# Patient Record
Sex: Male | Born: 1955
Health system: Southern US, Community
[De-identification: ages and names within clinical notes are randomized; demographics above are authoritative.]

## PROBLEM LIST (undated history)

## (undated) DIAGNOSIS — I499 Cardiac arrhythmia, unspecified: Secondary | ICD-10-CM

## (undated) DIAGNOSIS — J45909 Unspecified asthma, uncomplicated: Secondary | ICD-10-CM

## (undated) DIAGNOSIS — R911 Solitary pulmonary nodule: Secondary | ICD-10-CM

## (undated) DIAGNOSIS — C801 Malignant (primary) neoplasm, unspecified: Secondary | ICD-10-CM

## (undated) DIAGNOSIS — Z5189 Encounter for other specified aftercare: Secondary | ICD-10-CM

## (undated) DIAGNOSIS — C3490 Malignant neoplasm of unspecified part of unspecified bronchus or lung: Secondary | ICD-10-CM

## (undated) DIAGNOSIS — I429 Cardiomyopathy, unspecified: Secondary | ICD-10-CM

## (undated) DIAGNOSIS — Q659 Congenital deformity of hip, unspecified: Secondary | ICD-10-CM

## (undated) DIAGNOSIS — M1990 Unspecified osteoarthritis, unspecified site: Secondary | ICD-10-CM

## (undated) DIAGNOSIS — B019 Varicella without complication: Secondary | ICD-10-CM

## (undated) DIAGNOSIS — T7840XA Allergy, unspecified, initial encounter: Secondary | ICD-10-CM

## (undated) HISTORY — DX: Varicella without complication: B01.9

## (undated) HISTORY — DX: Encounter for other specified aftercare: Z51.89

## (undated) HISTORY — PX: POLYPECTOMY: SHX149

## (undated) HISTORY — PX: HERNIA REPAIR: SHX51

## (undated) HISTORY — DX: Malignant neoplasm of unspecified part of unspecified bronchus or lung: C34.90

## (undated) HISTORY — DX: Malignant (primary) neoplasm, unspecified: C80.1

## (undated) HISTORY — DX: Congenital deformity of hip, unspecified: Q65.9

## (undated) HISTORY — DX: Allergy, unspecified, initial encounter: T78.40XA

## (undated) HISTORY — PX: VASECTOMY: SHX75

## (undated) HISTORY — PX: HIP SURGERY: SHX245

## (undated) HISTORY — PX: JOINT REPLACEMENT: SHX530

## (undated) HISTORY — PX: SPINE SURGERY: SHX786

---

## 1983-08-09 HISTORY — PX: TOTAL HIP ARTHROPLASTY: SHX124

## 1999-09-08 ENCOUNTER — Encounter: Payer: Self-pay | Admitting: Emergency Medicine

## 1999-09-08 ENCOUNTER — Emergency Department (HOSPITAL_COMMUNITY): Admission: EM | Admit: 1999-09-08 | Discharge: 1999-09-08 | Payer: Self-pay | Admitting: *Deleted

## 2002-12-16 ENCOUNTER — Encounter: Admission: RE | Admit: 2002-12-16 | Discharge: 2002-12-16 | Payer: Self-pay | Admitting: Neurosurgery

## 2002-12-16 ENCOUNTER — Encounter: Payer: Self-pay | Admitting: Diagnostic Radiology

## 2002-12-16 ENCOUNTER — Encounter: Payer: Self-pay | Admitting: Neurosurgery

## 2002-12-30 ENCOUNTER — Encounter: Admission: RE | Admit: 2002-12-30 | Discharge: 2002-12-30 | Payer: Self-pay | Admitting: Neurosurgery

## 2002-12-30 ENCOUNTER — Encounter: Payer: Self-pay | Admitting: Neurosurgery

## 2003-01-14 ENCOUNTER — Encounter: Admission: RE | Admit: 2003-01-14 | Discharge: 2003-01-14 | Payer: Self-pay | Admitting: Neurosurgery

## 2003-01-14 ENCOUNTER — Encounter: Payer: Self-pay | Admitting: Neurosurgery

## 2003-08-09 LAB — HM COLONOSCOPY

## 2004-07-26 ENCOUNTER — Ambulatory Visit: Payer: Self-pay | Admitting: Gastroenterology

## 2004-08-23 ENCOUNTER — Ambulatory Visit: Payer: Self-pay | Admitting: Gastroenterology

## 2006-08-08 HISTORY — PX: THUMB ARTHROSCOPY: SHX2509

## 2008-04-08 ENCOUNTER — Encounter: Payer: Self-pay | Admitting: Emergency Medicine

## 2008-04-08 ENCOUNTER — Inpatient Hospital Stay (HOSPITAL_COMMUNITY): Admission: EM | Admit: 2008-04-08 | Discharge: 2008-04-09 | Payer: Self-pay | Admitting: General Surgery

## 2008-04-17 ENCOUNTER — Ambulatory Visit (HOSPITAL_COMMUNITY): Admission: RE | Admit: 2008-04-17 | Discharge: 2008-04-17 | Payer: Self-pay | Admitting: General Surgery

## 2008-05-01 ENCOUNTER — Ambulatory Visit (HOSPITAL_COMMUNITY): Admission: RE | Admit: 2008-05-01 | Discharge: 2008-05-01 | Payer: Self-pay | Admitting: Physician Assistant

## 2008-12-15 ENCOUNTER — Encounter: Admission: RE | Admit: 2008-12-15 | Discharge: 2008-12-15 | Payer: Self-pay | Admitting: Internal Medicine

## 2009-03-18 ENCOUNTER — Inpatient Hospital Stay (HOSPITAL_COMMUNITY): Admission: RE | Admit: 2009-03-18 | Discharge: 2009-03-19 | Payer: Self-pay | Admitting: Orthopedic Surgery

## 2009-04-15 ENCOUNTER — Encounter: Admission: RE | Admit: 2009-04-15 | Discharge: 2009-07-07 | Payer: Self-pay | Admitting: Orthopedic Surgery

## 2010-11-13 LAB — URINALYSIS, ROUTINE W REFLEX MICROSCOPIC
Glucose, UA: NEGATIVE mg/dL
Ketones, ur: NEGATIVE mg/dL
Nitrite: NEGATIVE
Protein, ur: NEGATIVE mg/dL
pH: 5 (ref 5.0–8.0)

## 2010-11-13 LAB — COMPREHENSIVE METABOLIC PANEL
ALT: 25 U/L (ref 0–53)
AST: 28 U/L (ref 0–37)
Albumin: 4.4 g/dL (ref 3.5–5.2)
Calcium: 9.7 mg/dL (ref 8.4–10.5)
Creatinine, Ser: 0.9 mg/dL (ref 0.4–1.5)
GFR calc Af Amer: >60 mL/min (ref >60)
Sodium: 139 mEq/L (ref 135–145)
Total Protein: 7.4 g/dL (ref 6.0–8.3)

## 2010-11-13 LAB — CBC
HCT: 42.2 % (ref 39.0–52.0)
Hemoglobin: 14.4 g/dL (ref 13.0–17.0)
MCHC: 34.1 g/dL (ref 30.0–36.0)
MCHC: 34.1 g/dL (ref 30.0–36.0)
MCV: 96.8 fL (ref 78.0–100.0)
MCV: 96.9 fL (ref 78.0–100.0)
Platelets: 160 10*3/uL (ref 150–400)
Platelets: 204 10*3/uL (ref 150–400)
RBC: 5.09 MIL/uL (ref 4.22–5.81)
RDW: 12.4 % (ref 11.5–15.5)
RDW: 12.9 % (ref 11.5–15.5)

## 2010-11-13 LAB — BASIC METABOLIC PANEL
BUN: 7 mg/dL (ref 6–23)
CO2: 30 mEq/L (ref 19–32)
Calcium: 8.7 mg/dL (ref 8.4–10.5)
Chloride: 100 mEq/L (ref 96–112)
Creatinine, Ser: 1.06 mg/dL (ref 0.4–1.5)
GFR calc Af Amer: >60 mL/min (ref >60)

## 2010-11-13 LAB — ANAEROBIC CULTURE: Gram Stain: NONE SEEN

## 2010-11-13 LAB — WOUND CULTURE: Culture: NO GROWTH

## 2010-11-13 LAB — TYPE AND SCREEN: ABO/RH(D): B POS

## 2010-11-13 LAB — ABO/RH: ABO/RH(D): B POS

## 2010-11-13 LAB — APTT: aPTT: 28 seconds (ref 24–37)

## 2010-12-21 NOTE — Op Note (Signed)
Brett [name], [name] NO.:  [id number]   MEDICAL RECORD NO.:  [id number]          PATIENT TYPE:  INP   LOCATION:  5005                         FACILITY:  [facility]   PHYSICIAN:  Loreta Ave, M.D. DATE OF BIRTH:  January 29, 1956   DATE OF PROCEDURE:  03/18/2009  DATE OF DISCHARGE:                               OPERATIVE REPORT   PREOPERATIVE DIAGNOSES:  1. 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  2. 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  Now with      aseptic polyethylene wear and loosening, symptomatic.   POSTOPERATIVE DIAGNOSES:  1. 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  2. 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  Now with      aseptic polyethylene wear and loosening, symptomatic.  3. Significant polyethylene wear of the acetabulum.  4. Well-fixed metallic component.  5. Leg length inequality with residual shortening.  6. Significant synovitis and scar tissue from previous operative      interventions and polyethylene wear.  No evidence of infection.   PROCEDURES:  1. Exploration and revision of right total hip replacement.  2. Extensive synovectomy.  3. Lysis debridement of adhesions.  4. Conversion of acetabular component to a new size D polyethylene      shell with a 10-degree overhang and 32 mm internal diameter.  5. Revision of femoral component to a +5 x 32 mm head and neck.   SURGEON:  Loreta Ave, MD   ASSISTANT:  1. Eulas Post, MD  2. Genene Churn. Barry Dienes, [place]   Both present throughout the entire case, necessary for timely completion  of procedure.   ANESTHESIA:  General.   BLOOD LOSS:  200 mL.   BLOOD GIVEN:  None.   SPECIMENS:  None.   CULTURES:  Clear joint fluid was sent for aerobic and anaerobic culture,  although concern of infection was extremely low.   SPECIMENS:  None.   DRESSING:  Soft  and compressive with abduction pillow.   PROCEDURE:  The patient was brought to the operating room, placed on the  operating table in supine position.  After adequate anesthesia had been  obtained, turned to a lateral position.  Prepped and draped in usual  sterile fashion.  I utilized his previous longitudinal incision up to  the trochanter, but then extending posterosuperior.  Skin and  subcutaneous tissue were divided.  Iliotibial band exposed and incised.  Charnley retractor put in place.  Neurovascular structures were  identified and protected.  External rotator and capsule were taken down  off the back of the intertrochanteric groove and tied with FiberWire.  A  lot of synovitis particle debris adhesions throughout the entire hip  joint.  These were gradually excised and removed in its entirety.  A  scant amount of clear fluid was sent for aerobic and anaerobic cultures.  The hip was then dislocated posteriorly.  The femoral head removed.  The  stem was very well fixed with a  reasonable anteversion preserved and  reasonable length.  Exposure of the acetabulum.  Fortunately, this  turned out to be a 2-piece acetabular component.  I could fairly easily  removed the polyethylene shell.  The metallic shell was thoroughly  assessed and very solidly fixed throughout.  A lot of time was spent  removing adhesions inflammatory debris throughout.  I then went through  a sequence of trials to try to reestablish good position of his hip and  regain as much leg length as we could.  I finally chose a 32-mm internal  diameter polyethylene lining with a 10-degree overhang placed  posterosuperior.  We added length by revising polyethylene, but also  added length by using a 32-mm +5 mm head neck component.  After trials  were complete, the definitive polyethylene acetabular component was  seated in the metallic shell.  The head attached to the femur.  The hip  reduced.  I had regained about half of  his leg length inequality.  Good  stability in flexion/extension.  Wound was thoroughly irrigated.  External rotator and capsule repair to the back of the intertrochanteric  groove through drill holes and FiberWire tied over bony bridge.  Wound  irrigated.  Charnley retractor removed.  Iliotibial band closed with #1  Vicryl.  Skin and subcutaneous tissue with Vicryl and staples.  Margins  were injected with Marcaine.  Sterile compressive dressing applied.  Returned to supine position.  Abduction pillow placed.  Anesthesia  reversed.  Brought to recovery room.  Tolerated surgery well.  No  complications.      Loreta Ave, M.D.  Electronically Signed     DFM/MEDQ  D:  03/18/2009  T:  03/19/2009  Job:  [id number]

## 2010-12-21 NOTE — H&P (Signed)
[name], [name] NO.:  [id number]   MEDICAL RECORD NO.:  [id number]          PATIENT TYPE:  INP   LOCATION:  2107                         FACILITY:  [facility]   PHYSICIAN:  Gabrielle Dare. Janee Morn, M.D.DATE OF BIRTH:  06/22/1956   DATE OF ADMISSION:  04/08/2008  DATE OF DISCHARGE:                              HISTORY & PHYSICAL   CHIEF COMPLAINT:  Left-sided pain after fall.   HISTORY OF PRESENT ILLNESS:  Brett [name] is a very pleasant 55 year old  white male who was walking at work.  Near the end of his shift when he  fell towards his left side, he struck his left side on the ground and  his forehead.  He was evaluated at the [facility]  in [facility] and found to have a 10% left-sided apical pneumothorax.  I  accepted him in transfer to the trauma service for step-down bed, none  were available, so he presented to the medical intensive care unit.  On  arrival, he had no shortness of breath.  He does complain of some  significant left rib pain.  He had no loss of consciousness during this  event.  He claims he has had chronic trouble with his right hip due to  congenital hip dysplasia, and it gives out on him frequently.  He feels  this is the reason for his fall.   PAST MEDICAL HISTORY:  1. Glaucoma.  2. Right congenital hip dysplasia.   PAST SURGICAL HISTORY:  Five hip surgeries on the right side due to  congenital hip dysplasia and this includes a right hip replacement in  [place] about 23 years ago as well as a perihardware fracture.  He is  followed by Dr. Mckinley Jewel here in town and according to the patient,  is needing a revision within the next 6 months.   SOCIAL HISTORY:  He denies drug use.  He smokes cigarettes.  He  occasionally drinks alcohol.  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   ALLERGIES:  No known drug allergies.   MEDICATIONS:  Ibuprofen and Tylenol p.r.n.  He recently took  some  Vicodin after a teeth extraction but is not currently taking that now.  Tetanus is up-to-date.   REVIEW OF SYSTEMS:  MUSCULOSKELETAL:  Includes the above, chest wall  complaints and chronic trouble with his right hip.  CARDIAC:  Negative.  PULMONARY:  No shortness of breath, but pain along the left chest wall.  GI:  Negative.  GU:  Negative.  NEUROPSYCH:  Negative.  CONSTITUTIONAL:  Negative.  Remainder of the review of systems was unremarkable.   PHYSICAL EXAMINATION:  VITAL SIGNS:  Pulse 65, respirations 19, blood  pressure 113/63, and saturations 98% on 2 liters nasal cannula.  HEENT:  He has a small 2-cm laceration over his left eyebrow which was  closed down at the [facility] in [facility].  There  is no bleeding.  Eyes, pupils are equal and reactive.  He does wear  glasses.  Ears are clear.  Face is symmetric  and atraumatic.  NECK:  He has no midline tenderness or step-off.  He does have mild  muscular tenderness on the left side.  PULMONARY:  He has some tenderness and a small contusion over his left  chest wall.  LUNGS:  However, clear to auscultation and equal on both sides.  CARDIOVASCULAR:  Heart is regular.  No murmurs are heard.  Impulses  palpable laterally in the left chest.  Distal pulses are 2+ with no  peripheral edema.  ABDOMEN:  Soft and nontender.  Bowel sounds are present.  No  organomegaly is noted.  No masses are felt.  PELVIS:  Stable anteriorly.  MUSCULOSKELETAL:  He has scars from previous right hip surgeries noted.  He also has some limitation of range of motion in the right hip and some  slight decreased strength there.  However, he notes this is chronic.  BACK:  No step-offs or tenderness.  NEUROLOGIC:  He is awake and alert.  Glasgow coma scale is 15.  He is  moving all extremities well except for limited in his right hip on a  chronic basis as above.   LABORATORY DATA:  Pending.  Chest x-ray shows 10% left apical   pneumothorax with no visualized rib fractures.  CT scan of the head  negative.  CT scan of cervical spine negative for acute change.   IMPRESSION:  A 55 year old white male who is status post fall with:  1. Left apical pneumothorax 10%.  2. Laceration over left eyebrow.   PLAN:  To admit to the trauma service and step-down unit status and  check a followup chest x-ray in the morning.  I advised him if his  pneumothorax enlarges, he may well need a tube thoracostomy.  Questions  were answered and he expressed his understanding.      Gabrielle Dare Janee Morn, M.D.  Electronically Signed     BET/MEDQ  D:  04/09/2008  T:  04/09/2008  Job:  [id number]   cc:   Loreta Ave, M.D.

## 2010-12-21 NOTE — Discharge Summary (Signed)
NAMEIRVAN, TIEDT NO.:  [id number]   MEDICAL RECORD NO.:  [id number]          PATIENT TYPE:  INP   LOCATION:  2107                         FACILITY:  [facility]   PHYSICIAN:  Cherylynn Ridges, M.D.    DATE OF BIRTH:  16-Nov-1955   DATE OF ADMISSION:  04/08/2008  DATE OF DISCHARGE:  04/09/2008                               DISCHARGE SUMMARY   DISCHARGE DIAGNOSIS:  Left apical pneumothorax status post fall.   OTHER DIAGNOSIS:  Status post total right hip with need for likely  replacement.   DISCHARGE MEDICATIONS:  Vicodin 1-2 tablets every 4 hours as needed for  pain.  He will get 30 tablets with 1 refill.   He is to follow up to see [name] in the trauma clinic after getting a chest  x-ray on April 17, 2008.  Prescription for Vicodin and a chest x-ray  have been given to the patient.  He is to ambulate as tolerated.  He can  start driving when he does not have to take his pain medicine.  He can  get back to work as of Tuesday, which is April 15, 2008.  He has been  advised that if he should get acutely short of breath, to come back to  the ER as soon as possible.  The patient is a smoker.  He has also been  advised not to smoke as this may worsen his problem, increase  pneumothorax or worsen the atelectasis.  In spite of that, the patient  admits that he will likely smoke.  He will come back to see [name] in about  1 week.   BRIEF SUMMARY OF HOSPITAL COURSE:  The patient is a 55 year old who fell  down at work, breaking a rib in his left chest wall and likely causing a  left apical pneumothorax.  Although, no rib fractures were noted on  chest x-ray either yesterday or today, this is likely the etiology of  his pneumothorax, which slightly expanded on chest x-ray today, but not  to worry, clinically insignificant.  His oxygen saturation is between  93% to 96% on room air.  He does not appear to be clinically short of  breath.  His breath sounds are equal and  symmetrical bilaterally.  He  has no subcutaneous air and he is to be discharged to home in the care  of his family with pain medicines orally.  He will follow up to see [name]  in 1 week.      Cherylynn Ridges, M.D.  Electronically Signed     JOW/MEDQ  D:  04/09/2008  T:  04/09/2008  Job:  [id number]

## 2011-05-11 LAB — CBC
Hemoglobin: 13.6
MCHC: 34
Platelets: 230
RDW: 12.1

## 2011-05-11 LAB — BASIC METABOLIC PANEL
BUN: 10
CO2: 27
Calcium: 8.6
Creatinine, Ser: 0.79
GFR calc non Af Amer: >60
Glucose, Bld: 133 — ABNORMAL HIGH

## 2011-05-11 LAB — GLUCOSE, CAPILLARY: Glucose-Capillary: 127 — ABNORMAL HIGH

## 2011-07-25 NOTE — H&P (Signed)
Brett [name] is seen for follow-up. Status post revision total hip replacement on the right by me 03/18/09 doing well. No complaints with regards to his hip and he's really doing well with the revision. Comes in for routine follow-up. The other thing he's brought up has been an issue of painful deformity lesser toes right foot greater than left. Especially the 2nd and 4th toe on the right foot where he has painful fixed clawing of the 2nd toe and angular deformity PIP joint 4th toe where it rides under the 3rd toe. He's had high arches long-standing some spasticity both feet so he's had dynamic clawing of both feet. This is present on the left but everything was tolerable until he started getting fixed deformities on the right foot. He would like surgical correction. Remaining history is updated included in the chart. General exam is outlined included in the chart.   EXAMINATION: His gait and stance looks fairly good. Negative log roll right hip. Distally he has good pulses and sensation. He has a high arch on both feet and definitely has dynamic clawing of the lesser toes but this is correctable and when he goes into a plantigrade position alignment is acceptable and there's no skin breakdown or significant callosity. No marked deformity of the great toe on either side. In the right foot he has a fixed claw toe deformity 2nd toe and almost 90 degrees of flexion. Although there is hyperextension of the MP joint that is not fixed. He has marked skin irritation and breakdown over the top of the PIP joint and at the end of his toe which is forced up into flexion. The 4th toe has marked instability of the PIP joint so that the toe can be deformed easily varus and valgus and is going into slight flexion and is drifting under the 3rd toe. No skin breakdown there. Good ankle motion and stability both sides.   X-RAYS: Show good seating and alignment of the prosthesis of his hip nothing else new or different. X-rays of his  feet show the deformities as described clinically on 2 views right foot.   DISPOSITION: 1. In regards to total hip replacement doing well activity as tolerated routine follow-up in one year.  2. In regards to his feet we talked about what's involved with correction. On the 2nd toe this is a dorsal approach excision soft tissue and bone to get the toe straight at the PIP joint and then fusion with a buried K-wire. I don't think anything is going to have to be done with the MP joint. What's involved with the procedure risks benefits complications reviewed in detail. Anything short of that is not going to help and he understands. In regards to the 4th toe although this is not as much of a fixed deformity there is marked instability and drifting of the toe at the PIP joint. Approach there would be dorsal approach and arthrodesis possibly with insitu pinning to maintain straight alignment of the toe through the PIP joint. I told him if the deformity persists at the DIP joint I may have to fuse both joints and have a K-wire that may have to be removed going down the toe. Discussed risks benefits and possible complications in detail. I also reviewed the fact that he has dynamic clawing on both sides and he may continue to have further problems with deformities that become symptomatic in other toes over time.  All paperwork complete and questions answered. I'll see him at the time of  operative interventionl.,  Loreta Ave, M.D.  Electronically verified by Loreta Ave, M.D. DFM:kh D 06-16-11 T 06-17-11

## 2011-07-26 ENCOUNTER — Encounter (HOSPITAL_BASED_OUTPATIENT_CLINIC_OR_DEPARTMENT_OTHER): Payer: Self-pay | Admitting: *Deleted

## 2011-07-27 ENCOUNTER — Encounter (HOSPITAL_BASED_OUTPATIENT_CLINIC_OR_DEPARTMENT_OTHER): Payer: Self-pay | Admitting: *Deleted

## 2011-07-27 NOTE — Progress Notes (Signed)
No preop tests needed To bring crutches

## 2011-07-28 ENCOUNTER — Ambulatory Visit (HOSPITAL_BASED_OUTPATIENT_CLINIC_OR_DEPARTMENT_OTHER): Payer: 59 | Admitting: Anesthesiology

## 2011-07-28 ENCOUNTER — Ambulatory Visit (HOSPITAL_BASED_OUTPATIENT_CLINIC_OR_DEPARTMENT_OTHER)
Admission: RE | Admit: 2011-07-28 | Discharge: 2011-07-28 | Disposition: A | Discharge status: Home / Self Care | Payer: 59 | Source: Ambulatory Visit | Attending: Orthopedic Surgery | Admitting: Orthopedic Surgery

## 2011-07-28 ENCOUNTER — Encounter (HOSPITAL_BASED_OUTPATIENT_CLINIC_OR_DEPARTMENT_OTHER): Payer: Self-pay | Admitting: Anesthesiology

## 2011-07-28 ENCOUNTER — Encounter (HOSPITAL_BASED_OUTPATIENT_CLINIC_OR_DEPARTMENT_OTHER)
Admission: RE | Disposition: A | Discharge status: Home / Self Care | Payer: Self-pay | Source: Ambulatory Visit | Attending: Orthopedic Surgery

## 2011-07-28 ENCOUNTER — Encounter (HOSPITAL_BASED_OUTPATIENT_CLINIC_OR_DEPARTMENT_OTHER): Payer: Self-pay | Admitting: *Deleted

## 2011-07-28 DIAGNOSIS — Q667 Congenital pes cavus, unspecified foot: Secondary | ICD-10-CM | POA: Insufficient documentation

## 2011-07-28 DIAGNOSIS — Z4789 Encounter for other orthopedic aftercare: Secondary | ICD-10-CM

## 2011-07-28 HISTORY — DX: Unspecified osteoarthritis, unspecified site: M19.90

## 2011-07-28 HISTORY — PX: HAMMER TOE SURGERY: SHX385

## 2011-07-28 LAB — POCT HEMOGLOBIN-HEMACUE: Hemoglobin: 18.2 g/dL — ABNORMAL HIGH (ref 13.0–17.0)

## 2011-07-28 SURGERY — CORRECTION, HAMMER TOE
Anesthesia: General | Site: Toe | Laterality: Right | Wound class: Clean

## 2011-07-28 MED ORDER — BUPIVACAINE-EPINEPHRINE PF 0.5-1:200000 % IJ SOLN
INTRAMUSCULAR | Status: DC | PRN
  Administered 2011-07-28: 15 mL

## 2011-07-28 MED ORDER — FENTANYL CITRATE 0.05 MG/ML IJ SOLN
INTRAMUSCULAR | Status: DC | PRN
  Administered 2011-07-28: 50 ug via INTRAVENOUS

## 2011-07-28 MED ORDER — ONDANSETRON HCL 4 MG/2ML IJ SOLN
INTRAMUSCULAR | Status: DC | PRN
  Administered 2011-07-28: 4 mg via INTRAVENOUS

## 2011-07-28 MED ORDER — DEXAMETHASONE SODIUM PHOSPHATE 10 MG/ML IJ SOLN
INTRAMUSCULAR | Status: DC | PRN
  Administered 2011-07-28: 10 mg via INTRAVENOUS

## 2011-07-28 MED ORDER — LACTATED RINGERS IV SOLN
INTRAVENOUS | Status: DC
  Administered 2011-07-28: 08:00:00 via INTRAVENOUS

## 2011-07-28 MED ORDER — CEFAZOLIN SODIUM 1-5 GM-% IV SOLN: 1.0000 g | INTRAVENOUS | Status: DC

## 2011-07-28 MED ORDER — PROPOFOL 10 MG/ML IV EMUL
INTRAVENOUS | Status: DC | PRN
  Administered 2011-07-28: 200 mg via INTRAVENOUS

## 2011-07-28 MED ORDER — CHLORHEXIDINE GLUCONATE 4 % EX LIQD: 60.0000 mL | Freq: Once | CUTANEOUS | Status: DC

## 2011-07-28 MED ORDER — LIDOCAINE HCL (CARDIAC) 20 MG/ML IV SOLN
INTRAVENOUS | Status: DC | PRN
  Administered 2011-07-28: 60 mg via INTRAVENOUS

## 2011-07-28 MED ORDER — MIDAZOLAM HCL 5 MG/ML IJ SOLN
1.0000 mg | Freq: Once | INTRAMUSCULAR | Status: AC
  Administered 2011-07-28: 1 mg via INTRAVENOUS

## 2011-07-28 MED ORDER — CEFAZOLIN SODIUM-DEXTROSE 2-3 GM-% IV SOLR
2.0000 g | INTRAVENOUS | Status: AC
  Administered 2011-07-28: 2 g via INTRAVENOUS

## 2011-07-28 MED ORDER — FENTANYL CITRATE 0.05 MG/ML IJ SOLN
100.0000 ug | Freq: Once | INTRAMUSCULAR | Status: AC
  Administered 2011-07-28: 100 ug via INTRAVENOUS

## 2011-07-28 SURGICAL SUPPLY — 64 items
APL SKNCLS STERI-STRIP NONHPOA (GAUZE/BANDAGES/DRESSINGS)
BANDAGE CONFORM 2  STR LF (GAUZE/BANDAGES/DRESSINGS) ×1 IMPLANT
BANDAGE ELASTIC 4 VELCRO ST LF (GAUZE/BANDAGES/DRESSINGS) ×2 IMPLANT
BANDAGE ELASTIC 6 VELCRO ST LF (GAUZE/BANDAGES/DRESSINGS) IMPLANT
BANDAGE ESMARK 6X9 LF (GAUZE/BANDAGES/DRESSINGS) ×1 IMPLANT
BENZOIN TINCTURE PRP APPL 2/3 (GAUZE/BANDAGES/DRESSINGS) IMPLANT
BLADE AVERAGE 25X9 (BLADE) IMPLANT
BLADE CRESCENTIC 13.5X.38X32 (BLADE) IMPLANT
BLADE OSC/SAG .038X5.5 CUT EDG (BLADE) ×1 IMPLANT
BLADE SURG 15 STRL LF DISP TIS (BLADE) ×1 IMPLANT
BLADE SURG 15 STRL SS (BLADE) ×2
BNDG CMPR 9X6 STRL LF SNTH (GAUZE/BANDAGES/DRESSINGS) ×1
BNDG COHESIVE 4X5 TAN STRL (GAUZE/BANDAGES/DRESSINGS) ×2 IMPLANT
BNDG ESMARK 6X9 LF (GAUZE/BANDAGES/DRESSINGS) ×2
CANISTER SUCTION 1200CC (MISCELLANEOUS) ×1 IMPLANT
CLOTH BEACON ORANGE TIMEOUT ST (SAFETY) ×2 IMPLANT
COVER TABLE BACK 60X90 (DRAPES) ×2 IMPLANT
CUFF TOURNIQUET SINGLE 18IN (TOURNIQUET CUFF) ×1 IMPLANT
CUFF TOURNIQUET SINGLE 34IN LL (TOURNIQUET CUFF) IMPLANT
DECANTER SPIKE VIAL GLASS SM (MISCELLANEOUS) IMPLANT
DRAPE EXTREMITY T 121X128X90 (DRAPE) ×2 IMPLANT
DRAPE OEC MINIVIEW 54X84 (DRAPES) ×2 IMPLANT
DRAPE U 20/CS (DRAPES) ×2 IMPLANT
DRAPE U-SHAPE 47X51 STRL (DRAPES) ×2 IMPLANT
DURAPREP 26ML APPLICATOR (WOUND CARE) ×2 IMPLANT
ELECT NDL TIP 2.8 STRL (NEEDLE) ×1 IMPLANT
ELECT NEEDLE TIP 2.8 STRL (NEEDLE) IMPLANT
ELECT REM PT RETURN 9FT ADLT (ELECTROSURGICAL) ×2
ELECTRODE REM PT RTRN 9FT ADLT (ELECTROSURGICAL) ×1 IMPLANT
GAUZE XEROFORM 1X8 LF (GAUZE/BANDAGES/DRESSINGS) ×3 IMPLANT
GLOVE BIO SURGEON STRL SZ 6.5 (GLOVE) ×2 IMPLANT
GLOVE BIOGEL PI IND STRL 7.0 (GLOVE) IMPLANT
GLOVE BIOGEL PI IND STRL 8 (GLOVE) ×1 IMPLANT
GLOVE BIOGEL PI INDICATOR 7.0 (GLOVE) ×2
GLOVE BIOGEL PI INDICATOR 8 (GLOVE) ×1
GLOVE ORTHO TXT STRL SZ7.5 (GLOVE) ×4 IMPLANT
GOWN BRE IMP PREV XXLGXLNG (GOWN DISPOSABLE) ×2 IMPLANT
GOWN PREVENTION PLUS XLARGE (GOWN DISPOSABLE) ×4 IMPLANT
KWIRE 4.0 X .045IN (WIRE) ×2 IMPLANT
NDL HYPO 25X1 1.5 SAFETY (NEEDLE) IMPLANT
NEEDLE HYPO 25X1 1.5 SAFETY (NEEDLE) IMPLANT
NS IRRIG 1000ML POUR BTL (IV SOLUTION) ×2 IMPLANT
PACK BASIN DAY SURGERY FS (CUSTOM PROCEDURE TRAY) ×2 IMPLANT
PAD CAST 3X4 CTTN HI CHSV (CAST SUPPLIES) IMPLANT
PAD CAST 4YDX4 CTTN HI CHSV (CAST SUPPLIES) ×2 IMPLANT
PADDING CAST COTTON 3X4 STRL (CAST SUPPLIES) ×2
PADDING CAST COTTON 4X4 STRL (CAST SUPPLIES) ×2
PENCIL BUTTON HOLSTER BLD 10FT (ELECTRODE) ×2 IMPLANT
SPONGE GAUZE 4X4 12PLY (GAUZE/BANDAGES/DRESSINGS) ×2 IMPLANT
SPONGE LAP 4X18 X RAY DECT (DISPOSABLE) ×1 IMPLANT
STOCKINETTE 4X48 STRL (DRAPES) ×2 IMPLANT
SUCTION FRAZIER TIP 10 FR DISP (SUCTIONS) IMPLANT
SUT ETHIBOND 2 OS 4 DA (SUTURE) IMPLANT
SUT ETHILON 3 0 PS 1 (SUTURE) ×6 IMPLANT
SUT VIC AB 0 SH 27 (SUTURE) ×2 IMPLANT
SUT VIC AB 3-0 SH 27 (SUTURE) ×2
SUT VIC AB 3-0 SH 27X BRD (SUTURE) ×1 IMPLANT
SUT VICRYL 4-0 PS2 18IN ABS (SUTURE) IMPLANT
SYR BULB 3OZ (MISCELLANEOUS) ×2 IMPLANT
SYR CONTROL 10ML LL (SYRINGE) ×1 IMPLANT
TUBE CONNECTING 20X1/4 (TUBING) ×1 IMPLANT
UNDERPAD 30X30 INCONTINENT (UNDERPADS AND DIAPERS) ×2 IMPLANT
WATER STERILE IRR 1000ML POUR (IV SOLUTION) ×1 IMPLANT
YANKAUER SUCT BULB TIP NO VENT (SUCTIONS) IMPLANT

## 2011-07-28 NOTE — Transfer of Care (Signed)
Immediate Anesthesia Transfer of Care Note  Patient: Brett [name]  Procedure(s) Performed:  HAMMER TOE CORRECTION - right 2nd and 4th toes correction hammer toe, capsulotomy metatarsal-phalangeal joints  Patient Location: PACU  Anesthesia Type: General  Level of Consciousness: awake, alert  and oriented  Airway & Oxygen Therapy: Patient Spontanous Breathing and Patient connected to face mask oxygen  Post-op Assessment: Report given to PACU RN and Post -op Vital signs reviewed and stable  Post vital signs: Reviewed and stable  Complications: No apparent anesthesia complications

## 2011-07-28 NOTE — Anesthesia Postprocedure Evaluation (Signed)
  Anesthesia Post-op Note  Patient: Brett [name]  Procedure(s) Performed:  HAMMER TOE CORRECTION - right 2nd and 4th toes correction hammer toe, capsulotomy metatarsal-phalangeal joints  Patient Location: PACU  Anesthesia Type: GA combined with regional for post-op pain  Level of Consciousness: awake  Airway and Oxygen Therapy: Patient Spontanous Breathing  Post-op Pain: none  Post-op Assessment: Post-op Vital signs reviewed  Post-op Vital Signs: stable  Complications: No apparent anesthesia complications

## 2011-07-28 NOTE — Anesthesia Procedure Notes (Addendum)
Anesthesia Regional Block:  Ankle block  Pre-Anesthetic Checklist: ,, timeout performed, Correct Patient, Correct Site, Correct Laterality, Correct Procedure,, site marked, risks and benefits discussed,, at surgeon's request  Laterality: Right  Prep: chloraprep       Needles:   Needle Type: Other     Needle Length: 2.5cm  Needle Gauge: 25 and 25 G    Additional Needles:  Procedures: other Ankle block Narrative:  Start time: 07/28/2011 8:20 AM End time: 07/28/2011 8:35 AM Injection made incrementally with aspirations every 3 mL.  Performed by: Personally  Anesthesiologist: T Massagee  Additional Notes: Ankle Block performed.BP cuff, EKG monitor applied. Peri-ankle infiltration performed. Marcaine 0.5 % c Epi   Procedure Name: LMA Insertion Performed by: Sharyne Richters Pre-anesthesia Checklist: Patient identified, Timeout performed, Emergency Drugs available, Suction available and Patient being monitored Patient Re-evaluated:Patient Re-evaluated prior to inductionOxygen Delivery Method: Circle System Utilized Preoxygenation: Pre-oxygenation with 100% oxygen Intubation Type: IV induction LMA: LMA inserted LMA Size: 4.0 Number of attempts: 1 Placement Confirmation: breath sounds checked- equal and bilateral and positive ETCO2 Tube secured with: Tape Dental Injury: Teeth and Oropharynx as per pre-operative assessment

## 2011-07-28 NOTE — Interval H&P Note (Signed)
History and Physical Interval Note:  07/28/2011 9:28 AM  Brett [name]  has presented today for surgery, with the diagnosis of right 2nd and 4th claw toe  The various methods of treatment have been discussed with the patient and family. After consideration of risks, benefits and other options for treatment, the patient has consented to  Procedure(s): HAMMER TOE CORRECTION as a surgical intervention .  The patients' history has been reviewed, patient examined, no change in status, stable for surgery.  I have reviewed the patients' chart and labs.  Questions were answered to the patient's satisfaction.     Ehsan Corvin F

## 2011-07-28 NOTE — Interval H&P Note (Signed)
History and Physical Interval Note:  07/28/2011 7:32 AM  Brett [name]  has presented today for surgery, with the diagnosis of right 3rde claw toe  The various methods of treatment have been discussed with the patient and family. After consideration of risks, benefits and other options for treatment, the patient has consented to  Procedure(s): HAMMER TOE CORRECTION as a surgical intervention .  The patients' history has been reviewed, patient examined, no change in status, stable for surgery.  I have reviewed the patients' chart and labs.  Questions were answered to the patient's satisfaction.     [name] F

## 2011-07-28 NOTE — Brief Op Note (Signed)
07/28/2011  10:55 AM  PATIENT:  Brett [name]  55 y.o. 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

## 2011-07-28 NOTE — Progress Notes (Signed)
Assisted Dr. Massagee with right, ankle block. Side rails up, monitors on throughout procedure. See vital signs in flow sheet. Tolerated Procedure well. 

## 2011-07-28 NOTE — Anesthesia Preprocedure Evaluation (Addendum)
Anesthesia Evaluation  Patient identified by MRN, date of birth, ID band Patient awake    Reviewed: Allergy & Precautions, H&P , NPO status   History of Anesthesia Complications Negative for: history of anesthetic complications  Airway Mallampati: I  Neck ROM: Full    Dental  (+) Teeth Intact   Pulmonary neg pulmonary ROS,  clear to auscultation        Cardiovascular neg cardio ROS Regular Normal    Neuro/Psych    GI/Hepatic negative GI ROS, Neg liver ROS,   Endo/Other  Negative Endocrine ROS  Renal/GU negative Renal ROS     Musculoskeletal   Abdominal   Peds  Hematology   Anesthesia Other Findings   Reproductive/Obstetrics                          Anesthesia Physical Anesthesia Plan  ASA: I  Anesthesia Plan: General   Post-op Pain Management:    Induction:   Airway Management Planned: LMA  Additional Equipment:   Intra-op Plan:   Post-operative Plan: Extubation in OR  Informed Consent: I have reviewed the patients History and Physical, chart, labs and discussed the procedure including the risks, benefits and alternatives for the proposed anesthesia with the patient or authorized representative who has indicated his/her understanding and acceptance.     Plan Discussed with: CRNA and Surgeon  Anesthesia Plan Comments:        Anesthesia Quick Evaluation

## 2011-07-29 ENCOUNTER — Encounter (HOSPITAL_BASED_OUTPATIENT_CLINIC_OR_DEPARTMENT_OTHER): Payer: Self-pay | Admitting: Orthopedic Surgery

## 2011-07-29 NOTE — Op Note (Signed)
NAME:  PANFILO, KETCHUM NO.:  MEDICAL RECORD NO.:  [id number]  LOCATION:                                 FACILITY:  PHYSICIAN:  Loreta Ave, M.D.      DATE OF BIRTH:  DATE OF PROCEDURE:  07/28/2011 DATE OF DISCHARGE:                              OPERATIVE REPORT   PREOPERATIVE DIAGNOSES:  Right foot markedly high arch with a Morton's foot much longer second toe than another toes.  Fixed claw toe deformity, PIP joint, second toe.  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  POSTOPERATIVE DIAGNOSES:  Right foot markedly high arch with a Morton's foot much longer second toe than another toes.  Fixed claw toe deformity, PIP joint, second toe.  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  PROCEDURE:  Right foot correction of claw toe deformity 2nd toe with resection and PIP arthrodesis with dorsal soft tissue reefing. Correction of claw toe deformity and ligamentous instability 4th toe PIP joint with excision and PIP arthrodesis.  Soft tissue reefing.  Buried K- wire at arthrodesis site, both the 2nd and 4th toes.  SURGEON:  Loreta Ave, MD  ASSISTANT:  Zonia Kief, PA  ANESTHESIA:  General.  BLOOD LOSS:  Minimal.  SPECIMENS:  None.  CULTURES:  None.  COMPLICATION:  None.  DRESSINGS:  Soft compressive wooden shoe.  PROCEDURE IN DETAIL:  The patient was brought to the operating room and placed on the operating table in supine position.  After adequate anesthesia had been obtained, calf tourniquet applied.  Prepped and draped in usual sterile fashion.  Exsanguinated with elevation of Esmarch.  Tourniquet inflated to 250 mmHg.  Attention turned to the 2nd toe.  Elliptical excision of skin, extensor tendon over the dorsal aspect of the PIP joint.  The overlying callosity excised as well. Joint exposed.  Neurovascular  structures, collaterals protected.  I resected adequate bone from the proximal phalanx and the middle phalanx to get good end-to-end opposition of the bony surfaces and correct claw toe deformity to near full extension.  Confirmed fluoroscopically.  K- wire was then driven down the shaft of the middle and proximal phalanx to create a tunnel.  Driven down the proximal phalanx, cut to appropriate length.  The deformity corrected and then the K-wire was advanced across the side of the fusion.  This gave good alignment and compression.  I then reefed together the skin, extensor tendon with nylon and then tied a Xeroform bolster over top of that.  Attention turned to the 4th toe.  The same approach with a lesser extent, excision of skin and extensor tendon to expose the joint.  Lesser removal above to achieve deformity in good position.  Again a K-wire canal was created on both sides.  Placed in the proximal phalanx.  Toe corrected and then advanced distally correcting deformity with good stability and good bony apposition confirmed visually and fluoroscopically.  The dorsal soft tissues were reefed with nylon and then bolster of Xeroform closed over top.  Final construct examined fluoroscopically with good position of the K-wire implant and good apposition for arthrodesis.  Wounds had been irrigated before being closed.  Sterile compressive dressing applied. Tourniquet deflated and removed.  Wooden shoe applied.  Anesthesia reversed.  Brought to recovery room.  Tolerated surgery well.  No complications.     Loreta Ave, M.D.     DFM/MEDQ  D:  07/28/2011  T:  07/29/2011  Job:  [id number]

## 2013-05-28 ENCOUNTER — Encounter: Payer: Self-pay | Admitting: Family

## 2013-05-28 ENCOUNTER — Ambulatory Visit (INDEPENDENT_AMBULATORY_CARE_PROVIDER_SITE_OTHER): Payer: 59 | Admitting: Family

## 2013-05-28 ENCOUNTER — Telehealth: Payer: Self-pay | Admitting: *Deleted

## 2013-05-28 VITALS — BP 118/80 | HR 90 | Temp 98.7°F | Resp 16 | Ht 75.0 in | Wt 180.1 lb

## 2013-05-28 DIAGNOSIS — H409 Unspecified glaucoma: Secondary | ICD-10-CM

## 2013-05-28 DIAGNOSIS — Z720 Tobacco use: Secondary | ICD-10-CM

## 2013-05-28 DIAGNOSIS — Z87768 Personal history of other specified (corrected) congenital malformations of integument, limbs and musculoskeletal system: Secondary | ICD-10-CM

## 2013-05-28 DIAGNOSIS — Z8776 Personal history of (corrected) congenital malformations of integument, limbs and musculoskeletal system: Secondary | ICD-10-CM

## 2013-05-28 DIAGNOSIS — F17200 Nicotine dependence, unspecified, uncomplicated: Secondary | ICD-10-CM

## 2013-05-28 DIAGNOSIS — L309 Dermatitis, unspecified: Secondary | ICD-10-CM

## 2013-05-28 DIAGNOSIS — L719 Rosacea, unspecified: Secondary | ICD-10-CM

## 2013-05-28 DIAGNOSIS — Z87798 Personal history of other (corrected) congenital malformations: Secondary | ICD-10-CM

## 2013-05-28 DIAGNOSIS — L259 Unspecified contact dermatitis, unspecified cause: Secondary | ICD-10-CM

## 2013-05-28 MED ORDER — BETAMETHASONE DIPROPIONATE 0.05 % EX CREA: TOPICAL_CREAM | Freq: Two times a day (BID) | CUTANEOUS | Status: DC

## 2013-05-28 MED ORDER — METRONIDAZOLE 0.75 % EX GEL: CUTANEOUS | Status: DC

## 2013-05-28 NOTE — Telephone Encounter (Signed)
Received call from pharmacy that they do not have metrogel on hand but do have metronidazole cream, same strength. Per verbal from Provider, ok to dispense cream.

## 2013-05-28 NOTE — Patient Instructions (Signed)
Please schedule fasting physical at the front desk. Welcome to [facility]! 

## 2013-05-28 NOTE — Progress Notes (Signed)
Subjective:    Patient ID: Brett [name], male    DOB: 05-19-1956, 57 y.o.   MRN: [id number]  HPI  Brett [name] is a 57 yr old male who presents today to establish care.   Rosacea- Reports that he has used cream for rosacea in the past.  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  Had multiple surgeries.  Ultimately had a hip replacement.  Glaucoma- diagnosed 18 years ago.  Takes trusopt. Sees Dr. Lahoma Rocker.  Rash bilateral legs for several months.  Smoker- 15 cigarettes a day.  He has not tried to quit for a while.   Not interested in quitting. Wife recently diagnosed with ovarian cancer. Tells me that her care is his primary concern at this time.   Review of Systems  Constitutional:       Reports 5 pound weight loss in last 1 week.  HENT: Negative for hearing loss.   Eyes: Negative for visual disturbance.  Genitourinary:       Denies dysuria.  Rare nocturia  Musculoskeletal:       Mild occasional back pain  Neurological: Negative for headaches.  Hematological: Negative for adenopathy.  Psychiatric/Behavioral:       Denies depression/anxiety   Past Medical History  Diagnosis Date  . Arthritis   . Glaucoma     History   Social History  . Marital Status: Married    Spouse Name: N/A    Number of Children: N/A  . Years of Education: N/A   Occupational History  . Not on file.   Social History Main Topics  . Smoking status: Current Every Day Smoker -- 1.00 packs/day    Types: Cigarettes  . Smokeless tobacco: Not on file     Comment: 15-20 cigarettes daily  . Alcohol Use: 7.2 oz/week    12 Cans of beer per week  . Drug Use: No  . Sexual Activity: Not on file   Other Topics Concern  . 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    Past Surgical History  Procedure Laterality Date  . Hip surgery  1986 & 2010    rt total hip-8/10-multiple rt hip surgeries  . Thumb arthroscopy  2008    rt  . Hammer toe surgery  07/28/2011    Procedure: HAMMER TOE CORRECTION;  Surgeon: Loreta Ave, MD;  Location: [facility];  Service: Orthopedics;  Laterality: Right;  right 2nd and 4th toes correction hammer toe, capsulotomy metatarsal-phalangeal joints    Family History  Problem Relation Age of Onset  . Cancer Mother 51    history of colon cancer  . Rosacea Mother   . Rosacea Father   . Cancer Cousin     colon    No Known Allergies  Current Outpatient Prescriptions on File Prior to Visit  Medication Sig Dispense Refill  . dorzolamide (TRUSOPT) 2 % ophthalmic solution Place 1 drop into both eyes 2 (two) times daily.         No current facility-administered medications on file prior to visit.    BP 118/80  Pulse 90  Temp(Src) 98.7 F (37.1 C) (Oral)  Resp 16  Ht 6\' 3"  (1.905 m)  Wt 180 lb  1.3 oz (81.684 kg)  BMI 22.51 kg/m2  SpO2 99%       Objective:   Physical Exam  Constitutional: He is oriented to person, place, and time. He appears well-developed and well-nourished. No distress.  HENT:  Head: Normocephalic and atraumatic.  Cardiovascular: Normal rate and regular rhythm.   No murmur heard. Pulmonary/Chest: Effort normal and breath sounds normal. No respiratory distress. He has no wheezes. He has no rales. He exhibits no tenderness.  Musculoskeletal: He exhibits no edema.  Lymphadenopathy:    He has no cervical adenopathy.  Neurological: He is alert and oriented to person, place, and time.  Skin: Skin is warm and dry.  Eczematous rash noted on bilateral shins. Some erythema nose/cheeks consistent with roseacea  Psychiatric: He has a normal mood and affect. His behavior is normal. Judgment and thought content normal.           Assessment & Plan:

## 2013-05-30 DIAGNOSIS — Z8776 Personal history of (corrected) congenital malformations of integument, limbs and musculoskeletal system: Secondary | ICD-10-CM | POA: Insufficient documentation

## 2013-05-30 DIAGNOSIS — Z720 Tobacco use: Secondary | ICD-10-CM

## 2013-05-30 DIAGNOSIS — L719 Rosacea, unspecified: Secondary | ICD-10-CM | POA: Insufficient documentation

## 2013-05-30 DIAGNOSIS — Z87891 Personal history of nicotine dependence: Secondary | ICD-10-CM | POA: Insufficient documentation

## 2013-05-30 DIAGNOSIS — L309 Dermatitis, unspecified: Secondary | ICD-10-CM

## 2013-05-30 DIAGNOSIS — H409 Unspecified glaucoma: Secondary | ICD-10-CM | POA: Insufficient documentation

## 2013-05-30 DIAGNOSIS — Z87768 Personal history of other specified (corrected) congenital malformations of integument, limbs and musculoskeletal system: Secondary | ICD-10-CM

## 2013-05-30 HISTORY — DX: Personal history of other specified (corrected) congenital malformations of integument, limbs and musculoskeletal system: Z87.768

## 2013-05-30 HISTORY — DX: Rosacea, unspecified: L71.9

## 2013-05-30 HISTORY — DX: Unspecified glaucoma: H40.9

## 2013-05-30 HISTORY — DX: Tobacco use: Z72.0

## 2013-05-30 HISTORY — DX: Dermatitis, unspecified: L30.9

## 2013-05-30 NOTE — Assessment & Plan Note (Signed)
Will rx with diprolene cream.

## 2013-05-30 NOTE — Assessment & Plan Note (Signed)
On trusopt. Management per opthalmology- Dr. Theodoro Kos.

## 2013-05-30 NOTE — Assessment & Plan Note (Signed)
Rx provided for metronidazole gel.

## 2013-05-30 NOTE — Assessment & Plan Note (Signed)
We discussed importance of quitting smoking and various options available to help him quit such as nicotine patch and chantix.  Not motivated to quit.  3-5 minutes spent counseling pt on smoking cessation.

## 2013-05-30 NOTE — Assessment & Plan Note (Signed)
Clinically stable following THA.

## 2013-11-07 ENCOUNTER — Ambulatory Visit (INDEPENDENT_AMBULATORY_CARE_PROVIDER_SITE_OTHER): Payer: 59 | Admitting: Physician Assistant

## 2013-11-07 ENCOUNTER — Ambulatory Visit: Payer: 59 | Admitting: Physician Assistant

## 2013-11-07 ENCOUNTER — Encounter: Payer: Self-pay | Admitting: Physician Assistant

## 2013-11-07 VITALS — BP 110/76 | HR 86 | Temp 98.1°F | Resp 16 | Ht 75.0 in | Wt 175.8 lb

## 2013-11-07 DIAGNOSIS — J209 Acute bronchitis, unspecified: Secondary | ICD-10-CM | POA: Insufficient documentation

## 2013-11-07 MED ORDER — AZITHROMYCIN 250 MG PO TABS: ORAL_TABLET | ORAL | Status: DC

## 2013-11-07 NOTE — Assessment & Plan Note (Signed)
Rx Z-Pack.  Increase fluids.  Rest.  Saline nasal spray.  Delsym for cough.  Mucinex for congestion.  Call or return to clinic if symptoms are not improving.

## 2013-11-07 NOTE — Progress Notes (Signed)
Patient presents to clinic today c/o sinus pressure, chest congestion, productive cough and fatigue x 1 month. Patient denies fever, shortness of breath or pleuritic chest pain.  Denies recent travel.  Past Medical History  Diagnosis Date  . Arthritis   . Glaucoma     Current Outpatient Prescriptions on File Prior to Visit  Medication Sig Dispense Refill  . betamethasone dipropionate (DIPROLENE) 0.05 % cream Apply topically 2 (two) times daily. Apply to rash on legs daily as needed  30 g  1  . dorzolamide (TRUSOPT) 2 % ophthalmic solution Place 1 drop into both eyes 2 (two) times daily.        [name] loratadine (CLARITIN) 10 MG tablet Take 10 mg by mouth daily.      . metroNIDAZOLE (METROGEL) 0.75 % gel Apply twice daily face.  45 g  2   No current facility-administered medications on file prior to visit.    No Known Allergies  Family History  Problem Relation Age of Onset  . Cancer Mother 43    history of colon cancer  . Rosacea Mother   . Rosacea Father   . Cancer Cousin     colon    History   Social History  . Marital Status: Married    Spouse Name: N/A    Number of Children: N/A  . Years of Education: N/A   Social History Main Topics  . Smoking status: Current Every Day Smoker -- 1.00 packs/day    Types: Cigarettes  . Smokeless tobacco: None     Comment: 15-20 cigarettes daily  . Alcohol Use: 7.2 oz/week    12 Cans of beer per week  . Drug Use: No  . Sexual Activity: None   Other Topics Concern  . 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   Review of Systems - See HPI.  All other ROS are negative.  BP 110/76  Pulse 86  Temp(Src) 98.1 F (36.7 C) (Oral)  Resp 16  Ht 6\' 3"  (1.905 m)  Wt 175 lb 12 oz (79.72 kg)  BMI  21.97 kg/m2  SpO2 98%  Physical Exam  Vitals reviewed. Constitutional: He is oriented to person, place, and time and well-developed, well-nourished, and in no distress.  HENT:  Head: Normocephalic and atraumatic.  Right Ear: External ear normal.  Left Ear: External ear normal.  Nose: Nose normal.  Mouth/Throat: Oropharynx is clear and moist. No oropharyngeal exudate.  Eyes: Conjunctivae are normal. Pupils are equal, round, and reactive to light.  Neck: Neck supple.  Cardiovascular: Normal rate, regular rhythm, normal heart sounds and intact distal pulses.   Pulmonary/Chest: Effort normal and breath sounds normal. No respiratory distress. He has no wheezes. He has no rales. He exhibits no tenderness.  Lymphadenopathy:    He has no cervical adenopathy.  Neurological: He is alert and oriented to person, place, and time.  Skin: Skin is warm and dry. No rash noted.  Psychiatric: Affect normal.   Assessment/Plan: Acute bronchitis Rx Z-Pack.  Increase fluids.  Rest.  Saline nasal spray.  Delsym for cough.  Mucinex for congestion.  Call or return to clinic if symptoms are not improving.

## 2013-11-07 NOTE — Patient Instructions (Signed)
Please take antibiotic as prescribed.  Increase fluids.  Delsym for cough.  Mucinex-DM for chest congestion.  Take a multivitamin.  Call or return to clinic if symptoms are not improving.  Acute Bronchitis Bronchitis is when the airways that extend from the windpipe into the lungs get red, puffy, and painful (inflamed). Bronchitis often causes thick spit (mucus) to develop. This leads to a cough. A cough is the most common symptom of bronchitis. In acute bronchitis, the condition usually begins suddenly and goes away over time (usually in 2 weeks). Smoking, allergies, and asthma can make bronchitis worse. Repeated episodes of bronchitis may cause more lung problems. HOME CARE  Rest.  Drink enough fluids to keep your pee (urine) clear or pale yellow (unless you need to limit fluids as told by your doctor).  Only take over-the-counter or prescription medicines as told by your doctor.  Avoid smoking and secondhand smoke. These can make bronchitis worse. If you are a smoker, think about using nicotine gum or skin patches. Quitting smoking will help your lungs heal faster.  Reduce the chance of getting bronchitis again by:  Washing your hands often.  Avoiding people with cold symptoms.  Trying not to touch your hands to your mouth, nose, or eyes.  Follow up with your doctor as told. GET HELP IF: Your symptoms do not improve after 1 week of treatment. Symptoms include:  Cough.  Fever.  Coughing up thick spit.  Body aches.  Chest congestion.  Chills.  Shortness of breath.  Sore throat. GET HELP RIGHT AWAY IF:   You have an increased fever.  You have chills.  You have severe shortness of breath.  You have bloody thick spit (sputum).  You throw up (vomit) often.  You lose too much body fluid (dehydration).  You have a severe headache.  You faint. MAKE SURE YOU:   Understand these instructions.  Will watch your condition.  Will get help right away if you are not  doing well or get worse. Document Released: 01/11/2008 Document Revised: 03/27/2013 Document Reviewed: 01/15/2013 [facility] Patient Information 2014 [place].

## 2013-11-07 NOTE — Progress Notes (Signed)
Pre visit review using our clinic review tool, if applicable. No additional management support is needed unless otherwise documented below in the visit note/SLS  

## 2013-11-11 ENCOUNTER — Telehealth: Payer: Self-pay | Admitting: Family

## 2013-11-11 NOTE — Telephone Encounter (Signed)
Relevant patient education mailed to patient.  

## 2014-11-10 ENCOUNTER — Encounter: Payer: Self-pay | Admitting: Physician Assistant

## 2014-11-10 ENCOUNTER — Ambulatory Visit (INDEPENDENT_AMBULATORY_CARE_PROVIDER_SITE_OTHER): Payer: 59 | Admitting: Physician Assistant

## 2014-11-10 VITALS — BP 124/67 | HR 98 | Temp 98.5°F | Resp 16 | Ht 75.0 in | Wt 181.2 lb

## 2014-11-10 DIAGNOSIS — L259 Unspecified contact dermatitis, unspecified cause: Secondary | ICD-10-CM

## 2014-11-10 MED ORDER — CLOBETASOL PROPIONATE 0.05 % EX CREA: 1.0000 "application " | TOPICAL_CREAM | Freq: Two times a day (BID) | CUTANEOUS | Status: DC

## 2014-11-10 NOTE — Assessment & Plan Note (Signed)
Rash seems consistent with contact dermatitis. Rx clobetasol cream twice daily for one week. Supportive measures discussed with patient. Follow-up if symptoms are not improving.

## 2014-11-10 NOTE — Progress Notes (Signed)
Pre visit review using our clinic review tool, if applicable. No additional management support is needed unless otherwise documented below in the visit note/SLS  

## 2014-11-10 NOTE — Patient Instructions (Signed)
Please apply the clobetasol steroid cream twice daily as directed for one week. Apply topical astringentlike Seabreeze or witch hazel to the area once daily. Keep the area clean and dry. Cool compresses and lotions may help with itch. Call or return to clinic if symptoms not improving.

## 2014-11-10 NOTE — Progress Notes (Signed)
    Patient presents to clinic today c/o pruritic rash of lower back that has been present for one week. Patient denies recent travel. Denies fever, chills or malaise. Denies change in hygiene products, detergents or lotions. Endorse new pet in the home. Denies sick contact with similar symptoms.  Past Medical History  Diagnosis Date  . Arthritis   . Glaucoma     Current Outpatient Prescriptions on File Prior to Visit  Medication Sig Dispense Refill  . betamethasone dipropionate (DIPROLENE) 0.05 % cream Apply topically 2 (two) times daily. Apply to rash on legs daily as needed 30 g 1  . dorzolamide (TRUSOPT) 2 % ophthalmic solution Place 1 drop into both eyes 2 (two) times daily.      [name] loratadine (CLARITIN) 10 MG tablet Take 10 mg by mouth daily as needed.     . metroNIDAZOLE (METROGEL) 0.75 % gel Apply twice daily face. (Patient taking differently: 2 (two) times daily as needed. Apply twice daily face.) 45 g 2   No current facility-administered medications on file prior to visit.    No Known Allergies  Family History  Problem Relation Age of Onset  . Cancer Mother 42    history of colon cancer  . Rosacea Mother   . Rosacea Father   . Cancer Cousin     colon    History   Social History  . Marital Status: Married    Spouse Name: N/A  . Number of Children: N/A  . Years of Education: N/A   Social History Main Topics  . Smoking status: Current Every Day Smoker -- 1.00 packs/day    Types: Cigarettes  . Smokeless tobacco: Not on file     Comment: 15-20 cigarettes daily  . Alcohol Use: 7.2 oz/week    12 Cans of beer per week  . Drug Use: No  . Sexual Activity: Not on file   Other Topics Concern  . 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   Review of Systems - See HPI.  All other ROS are negative.  BP 124/67 mmHg  Pulse 98  Temp(Src) 98.5 F (36.9 C) (Oral)  Resp 16  Ht 6\' 3"  (1.905 m)  Wt 181 lb 4 oz (82.214 kg)  BMI 22.65 kg/m2  SpO2 98%  Physical Exam  Constitutional: He is oriented to person, place, and time and well-developed, well-nourished, and in no distress.  HENT:  Head: Normocephalic and atraumatic.  Eyes: Conjunctivae are normal.  Cardiovascular: Normal rate, regular rhythm, normal heart sounds and intact distal pulses.   Pulmonary/Chest: Effort normal and breath sounds normal. No respiratory distress. He has no wheezes. He has no rales. He exhibits no tenderness.  Neurological: He is alert and oriented to person, place, and time.  Skin: Skin is warm and dry. Rash noted.  Vitals reviewed.  Assessment/Plan: Contact dermatitis Rash seems consistent with contact dermatitis. Rx clobetasol cream twice daily for one week. Supportive measures discussed with patient. Follow-up if symptoms are not improving.

## 2014-11-11 ENCOUNTER — Telehealth: Payer: Self-pay | Admitting: Family

## 2014-11-11 NOTE — Telephone Encounter (Signed)
Error/gd °

## 2014-11-11 NOTE — Telephone Encounter (Signed)
emmi emailed °

## 2014-12-12 ENCOUNTER — Encounter: Payer: 59 | Admitting: Family

## 2015-01-08 ENCOUNTER — Telehealth: Payer: Self-pay | Admitting: Family

## 2015-01-08 NOTE — Telephone Encounter (Signed)
Pre visit letter sent  °

## 2015-01-28 ENCOUNTER — Telehealth: Payer: Self-pay

## 2015-01-28 NOTE — Telephone Encounter (Signed)
See Speciality Notes

## 2015-01-29 ENCOUNTER — Ambulatory Visit (HOSPITAL_BASED_OUTPATIENT_CLINIC_OR_DEPARTMENT_OTHER)
Admission: RE | Admit: 2015-01-29 | Discharge: 2015-01-29 | Disposition: A | Discharge status: Home / Self Care | Payer: 59 | Source: Ambulatory Visit | Attending: Family | Admitting: Family

## 2015-01-29 ENCOUNTER — Ambulatory Visit (INDEPENDENT_AMBULATORY_CARE_PROVIDER_SITE_OTHER): Payer: 59 | Admitting: Family

## 2015-01-29 ENCOUNTER — Encounter: Payer: Self-pay | Admitting: Family

## 2015-01-29 VITALS — BP 126/78 | HR 65 | Temp 97.8°F | Resp 16 | Ht 73.0 in | Wt 181.2 lb

## 2015-01-29 DIAGNOSIS — M542 Cervicalgia: Secondary | ICD-10-CM

## 2015-01-29 DIAGNOSIS — M5030 Other cervical disc degeneration, unspecified cervical region: Secondary | ICD-10-CM

## 2015-01-29 DIAGNOSIS — Z23 Encounter for immunization: Secondary | ICD-10-CM

## 2015-01-29 DIAGNOSIS — M47892 Other spondylosis, cervical region: Secondary | ICD-10-CM | POA: Diagnosis not present

## 2015-01-29 DIAGNOSIS — Z0000 Encounter for general adult medical examination without abnormal findings: Secondary | ICD-10-CM | POA: Diagnosis not present

## 2015-01-29 DIAGNOSIS — I779 Disorder of arteries and arterioles, unspecified: Secondary | ICD-10-CM | POA: Insufficient documentation

## 2015-01-29 HISTORY — DX: Other cervical disc degeneration, unspecified cervical region: M50.30

## 2015-01-29 HISTORY — DX: Encounter for general adult medical examination without abnormal findings: Z00.00

## 2015-01-29 LAB — CBC WITH DIFFERENTIAL/PLATELET
BASOS PCT: 0.7 % (ref 0.0–3.0)
Basophils Absolute: 0.1 10*3/uL (ref 0.0–0.1)
Eosinophils Absolute: 0.3 10*3/uL (ref 0.0–0.7)
Eosinophils Relative: 2.9 % (ref 0.0–5.0)
HCT: 47.2 % (ref 39.0–52.0)
HEMOGLOBIN: 15.9 g/dL (ref 13.0–17.0)
LYMPHS PCT: 22.4 % (ref 12.0–46.0)
Lymphs Abs: 2.3 10*3/uL (ref 0.7–4.0)
MCHC: 33.7 g/dL (ref 30.0–36.0)
MCV: 97 fl (ref 78.0–100.0)
Monocytes Absolute: 1 10*3/uL (ref 0.1–1.0)
Monocytes Relative: 9.4 % (ref 3.0–12.0)
NEUTROS PCT: 64.6 % (ref 43.0–77.0)
Neutro Abs: 6.5 10*3/uL (ref 1.4–7.7)
Platelets: 195 10*3/uL (ref 150.0–400.0)
RBC: 4.86 Mil/uL (ref 4.22–5.81)
RDW: 12.6 % (ref 11.5–15.5)
WBC: 10.1 10*3/uL (ref 4.0–10.5)

## 2015-01-29 LAB — BASIC METABOLIC PANEL
BUN: 10 mg/dL (ref 6–23)
CHLORIDE: 103 meq/L (ref 96–112)
CO2: 28 meq/L (ref 19–32)
CREATININE: 0.86 mg/dL (ref 0.40–1.50)
Calcium: 9.2 mg/dL (ref 8.4–10.5)
GFR: 96.22 mL/min (ref >60.00)
GLUCOSE: 100 mg/dL — AB (ref 70–99)
Potassium: 4.3 mEq/L (ref 3.5–5.1)
Sodium: 137 mEq/L (ref 135–145)

## 2015-01-29 LAB — HEPATIC FUNCTION PANEL
ALT: 18 U/L (ref 0–53)
AST: 22 U/L (ref 0–37)
Albumin: 4.3 g/dL (ref 3.5–5.2)
Alkaline Phosphatase: 53 U/L (ref 39–117)
Bilirubin, Direct: 0.1 mg/dL (ref 0.0–0.3)
TOTAL PROTEIN: 6.7 g/dL (ref 6.0–8.3)
Total Bilirubin: 0.5 mg/dL (ref 0.2–1.2)

## 2015-01-29 LAB — LIPID PANEL
CHOLESTEROL: 169 mg/dL (ref 0–200)
HDL: 62.3 mg/dL (ref >39.00)
LDL CALC: 93 mg/dL (ref 0–99)
NonHDL: 106.7
Total CHOL/HDL Ratio: 3
Triglycerides: 71 mg/dL (ref 0.0–149.0)
VLDL: 14.2 mg/dL (ref 0.0–40.0)

## 2015-01-29 LAB — URINALYSIS, ROUTINE W REFLEX MICROSCOPIC
Bilirubin Urine: NEGATIVE
Hgb urine dipstick: NEGATIVE
Ketones, ur: NEGATIVE
Leukocytes, UA: NEGATIVE
NITRITE: NEGATIVE
RBC / HPF: NONE SEEN (ref 0–?)
Specific Gravity, Urine: 1.01 (ref 1.000–1.030)
Total Protein, Urine: NEGATIVE
Urine Glucose: NEGATIVE
Urobilinogen, UA: 0.2 (ref 0.0–1.0)
WBC UA: NONE SEEN (ref 0–?)
pH: 5.5 (ref 5.0–8.0)

## 2015-01-29 LAB — PSA: PSA: 1.68 ng/mL (ref 0.10–4.00)

## 2015-01-29 LAB — TSH: TSH: 1.03 u[IU]/mL (ref 0.35–4.50)

## 2015-01-29 MED ORDER — MELOXICAM 7.5 MG PO TABS: 7.5000 mg | ORAL_TABLET | Freq: Every day | ORAL | Status: DC

## 2015-01-29 NOTE — Progress Notes (Signed)
Pre visit review using our clinic review tool, if applicable. No additional management support is needed unless otherwise documented below in the visit note. 

## 2015-01-29 NOTE — Progress Notes (Addendum)
Subjective:    Patient ID: Brett [name], male    DOB: [birth year], 59 y.o.   MRN: [id number]  HPI  [name] presents today for annual physical.    Immunizations: due for tetanus and zostavax Diet: reports healthy diet Exercise:  Active at work  Colonoscopy: 2005- due Dental: up to date Vision: up to date- followed for glaucoma Tobacco abuse: 18 cig a day  Neck pain- see HPI for details.  Has worsened recently.    Review of Systems  Constitutional: Negative for unexpected weight change.  Eyes: Negative for visual disturbance.  Respiratory: Negative for shortness of breath.        + cough due to chronic rhinorrhea  Cardiovascular: Negative for chest pain.  Gastrointestinal: Negative for nausea, vomiting and diarrhea.  Genitourinary: Negative for dysuria and frequency.  Musculoskeletal: Negative for myalgias.       Reports some right sided neck pain.  Better if walking.  Denies numbness/tingling in arms  Skin:       Reports rosacea has been controlled.  Uses metrogel prn  Neurological: Negative for headaches.  Hematological: Negative for adenopathy.  Psychiatric/Behavioral:       Denies depression/anxiety   Past Medical History  Diagnosis Date  . Arthritis   . Glaucoma     History   Social History  . Marital Status: Married    Spouse Name: N/A  . Number of Children: N/A  . Years of Education: N/A   Occupational History  . Not on file.   Social History Main Topics  . Smoking status: Current Every Day Smoker -- 1.00 packs/day    Types: Cigarettes  . Smokeless tobacco: Not on file     Comment: 15-20 cigarettes daily  . Alcohol Use: 7.2 - 9.6 oz/week    12-16 Cans of beer per week  . Drug Use: No  . Sexual Activity: Not on file   Other Topics Concern  . 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    Past Surgical History  Procedure Laterality Date  . Hip surgery  1986 & 2010    rt total hip-8/10-multiple rt hip surgeries  . Thumb arthroscopy  2008    rt  . Hammer toe surgery  07/28/2011    Procedure: HAMMER TOE CORRECTION;  Surgeon: Ninetta Lights, MD;  Location: [facility];  Service: Orthopedics;  Laterality: Right;  right 2nd and 4th toes correction hammer toe, capsulotomy metatarsal-phalangeal joints    Family History  Problem Relation Age of Onset  . Cancer Mother 67    history of colon cancer  . Rosacea Mother   . Rosacea Father   . Cancer Cousin     colon    No Known Allergies  Current Outpatient Prescriptions on File Prior to Visit  Medication Sig Dispense Refill  . clobetasol cream (TEMOVATE) 8.27 % Apply 1 application topically 2 (two) times daily. 30 g 0  . dorzolamide (TRUSOPT) 2 % ophthalmic solution Place 1 drop into both eyes 2 (two) times daily.      [name] loratadine (CLARITIN) 10 MG tablet Take 10 mg by mouth daily as needed.     . metroNIDAZOLE (METROGEL) 0.75 % gel Apply twice daily face. (Patient  taking differently: 2 (two) times daily as needed. Apply twice daily face.) 45 g 2  . Pediatric Multivit-Minerals-C (FLINTSTONES GUMMIES COMPLETE PO) Take by mouth daily.     No current facility-administered medications on file prior to visit.    BP 126/78 mmHg  Pulse 65  Temp(Src) 97.8 F (36.6 C) (Oral)  Resp 16  Ht 6\' 1"  (1.854 m)  Wt 181 lb 3.2 oz (82.192 kg)  BMI 23.91 kg/m2  SpO2 97%       Objective:   Physical Exam  Physical Exam  Constitutional: He is oriented to person, place, and time. He appears well-developed and well-nourished. No distress.  HENT:  Head: Normocephalic and atraumatic.  Right Ear: Tympanic membrane and ear canal normal.  Left Ear: Tympanic membrane and ear canal normal.  Mouth/Throat: Oropharynx is clear  and moist.  Eyes: Pupils are equal, round, and reactive to light. No scleral icterus.  Neck: Normal range of motion. No thyromegaly present.  Cardiovascular: Normal rate and regular rhythm.   No murmur heard. Pulmonary/Chest: Effort normal and breath sounds normal. No respiratory distress. He has no wheezes. He has no rales. He exhibits no tenderness.  Abdominal: Soft. Bowel sounds are normal. He exhibits no distension and no mass. There is no tenderness. There is no rebound and no guarding.  Musculoskeletal: He exhibits no edema.  Lymphadenopathy:    He has no cervical adenopathy.  Neurological: He is alert and oriented to person, place, and time. He has normal patellar reflexes. He exhibits normal muscle tone. Coordination normal.  Skin: Skin is warm and dry. mild facial rosacea noted Psychiatric: He has a normal mood and affect. His behavior is normal. Judgment and thought content normal.  GU:  approx 2-3cm sebaceous cyst noted on underside of left scrotum        Assessment & Plan:         Assessment & Plan:  EKG is performed today and personally reviewed. Notes NSR without ischemic changes noted.

## 2015-01-29 NOTE — Assessment & Plan Note (Signed)
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  Obtain x ray of cpine, trial of short course of meloxicam.

## 2015-01-29 NOTE — Assessment & Plan Note (Signed)
Discussed healthy diet, exercise.  Refer for colo, Tdap today. Advised pt to check insurance coverage for zostavax, and the can book nurse visit for administration.  Counseled pt on tobacco cessation.

## 2015-01-29 NOTE — Addendum Note (Signed)
Addended by: Debbrah Alar on: 01/29/2015 02:59 PM   Modules accepted: Miquel Dunn

## 2015-01-29 NOTE — Patient Instructions (Addendum)
Please complete lab work prior to leaving.  Complete neck x ray on the first floor. Start meloxicam (anti-inflammatory) once daily for 2 weeks to see if this helps with neck pain.   You will be contacted about your referral for colonoscopy.  Try to add 30 minutes of exercise 5 days a week such as walking.   Work on quitting smoking.

## 2015-01-30 ENCOUNTER — Encounter: Payer: Self-pay | Admitting: Internal Medicine

## 2015-03-18 ENCOUNTER — Ambulatory Visit (AMBULATORY_SURGERY_CENTER): Payer: Self-pay | Admitting: *Deleted

## 2015-03-18 ENCOUNTER — Encounter: Payer: Self-pay | Admitting: Internal Medicine

## 2015-03-18 VITALS — Ht 74.0 in | Wt 180.0 lb

## 2015-03-18 DIAGNOSIS — Z86010 Personal history of colonic polyps: Secondary | ICD-10-CM

## 2015-03-18 MED ORDER — MOVIPREP 100 G PO SOLR: ORAL | Status: DC

## 2015-03-18 NOTE — Progress Notes (Signed)
No egg or soy allergy  No anesthesia or intubation problems per pt  No diet medications taken  Registered in EMMI   

## 2015-03-25 ENCOUNTER — Telehealth: Payer: Self-pay | Admitting: Internal Medicine

## 2015-03-25 NOTE — Telephone Encounter (Signed)
Spoke with patient. He states he was given the Moviprep from his pharmacy and he opened the box, but his instructions are for the [place]. Explained to patient that the Moviprep is used by Dr.Pyrtle. New instructions for Moviprep sent to patient via Mychart and in his mail box. Patient aware. No further questions from pt., he will call us back with any questions.

## 2015-04-01 ENCOUNTER — Encounter: Payer: 59 | Admitting: Internal Medicine

## 2015-06-17 ENCOUNTER — Encounter: Payer: Self-pay | Admitting: Internal Medicine

## 2015-06-17 ENCOUNTER — Ambulatory Visit (AMBULATORY_SURGERY_CENTER): Payer: 59 | Admitting: Internal Medicine

## 2015-06-17 VITALS — BP 118/71 | HR 63 | Temp 97.9°F | Resp 17 | Ht 73.0 in | Wt 181.0 lb

## 2015-06-17 DIAGNOSIS — Z86010 Personal history of colonic polyps: Secondary | ICD-10-CM

## 2015-06-17 DIAGNOSIS — D120 Benign neoplasm of cecum: Secondary | ICD-10-CM

## 2015-06-17 HISTORY — PX: COLONOSCOPY: SHX174

## 2015-06-17 MED ORDER — SODIUM CHLORIDE 0.9 % IV SOLN: 500.0000 mL | INTRAVENOUS | Status: DC

## 2015-06-17 NOTE — Op Note (Signed)
[facility]  [address] [place], [address]   COLONOSCOPY PROCEDURE REPORT  PATIENT: Brett [name], Brett [name]  MR#: [id number] BIRTHDATE: 1955/12/31 , 58  yrs. old GENDER: male ENDOSCOPIST: Jerene Bears, MD PROCEDURE DATE:  06/17/2015 PROCEDURE:   Colonoscopy, surveillance and Colonoscopy with snare polypectomy First Screening Colonoscopy - Avg.  risk and is 50 yrs.  old or older - No.  Prior Negative Screening - Now for repeat screening. N/A  History of Adenoma - Now for follow-up colonoscopy & has been > or = to 3 yrs.  Yes hx of adenoma.  Has been 3 or more years since last colonoscopy.  Polyps removed today? Yes ASA CLASS:   Class II INDICATIONS:Surveillance due to prior colonic neoplasia and PH Colon Adenoma (colonoscopy 2005 with Dr. Sharlett Iles). MEDICATIONS: Monitored anesthesia care and Propofol 300 mg IV  DESCRIPTION OF PROCEDURE:   After the risks benefits and alternatives of the procedure were thoroughly explained, informed consent was obtained.  The digital rectal exam revealed no rectal mass.   The LB [id number]  endoscope was introduced through the anus and advanced to the cecum, which was identified by both the appendix and ileocecal valve. No adverse events experienced. The quality of the prep was good.  (Suprep was used)  The instrument was then slowly withdrawn as the colon was fully examined. Estimated blood loss is zero unless otherwise noted in this procedure report.  COLON FINDINGS: Two sessile polyps ranging from 3 to 85mm in size were found at the cecum.  Polypectomies were performed with a cold snare.  The resection was complete, the polyp tissue was completely retrieved and sent to histology.   There was mild diverticulosis noted in the ascending colon and left colon.  Retroflexed views revealed internal hemorrhoids. The time to cecum = 0.6 Withdrawal time = 8.0   The scope was withdrawn and the procedure completed. COMPLICATIONS:  There were no immediate complications.  ENDOSCOPIC IMPRESSION: 1.   Two sessile polyps ranging from 3 to 50mm in size were found at the cecum; polypectomies were performed with a cold snare 2.   Mild diverticulosis was noted in the ascending colon and left colon  RECOMMENDATIONS: 1.  Await pathology results 2.  High fiber diet 3.  If the polyps removed today are proven to be adenomatous (pre-cancerous) polyps, you will need a repeat colonoscopy in 5 years.  Otherwise you should continue to follow colorectal cancer screening guidelines for "routine risk" patients with colonoscopy in 10 years.  You will receive a letter within 1-2 weeks with the results of your biopsy as well as final recommendations.  Please call my office if you have not received a letter after 3 weeks.  eSigned:  Jerene Bears, MD 06/17/2015 9:39 AM  cc: Debbrah Alar FNP and The Patient

## 2015-06-17 NOTE — Progress Notes (Signed)
Called to room to assist during endoscopic procedure.  Patient ID and intended procedure confirmed with present staff. Received instructions for my participation in the procedure from the performing physician.  

## 2015-06-17 NOTE — Patient Instructions (Signed)
YOU HAD AN ENDOSCOPIC PROCEDURE TODAY AT THE [facility]:   Refer to the procedure report that was given to you for any specific questions about what was found during the examination.  If the procedure report does not answer your questions, please call your gastroenterologist to clarify.  If you requested that your care partner not be given the details of your procedure findings, then the procedure report has been included in a sealed envelope for you to review at your convenience later.  YOU SHOULD EXPECT: Some feelings of bloating in the abdomen. Passage of more gas than usual.  Walking can help get rid of the air that was put into your GI tract during the procedure and reduce the bloating. If you had a lower endoscopy (such as a colonoscopy or flexible sigmoidoscopy) you may notice spotting of blood in your stool or on the toilet paper. If you underwent a bowel prep for your procedure, you may not have a normal bowel movement for a few days.  Please Note:  You might notice some irritation and congestion in your nose or some drainage.  This is from the oxygen used during your procedure.  There is no need for concern and it should clear up in a day or so.  SYMPTOMS TO REPORT IMMEDIATELY:   Following lower endoscopy (colonoscopy or flexible sigmoidoscopy):  Excessive amounts of blood in the stool  Significant tenderness or worsening of abdominal pains  Swelling of the abdomen that is new, acute  Fever of 100F or higher     For urgent or emergent issues, a gastroenterologist can be reached at any hour by calling [phone].   DIET: Your first meal following the procedure should be a small meal and then it is ok to progress to your normal diet. Heavy or fried foods are harder to digest and may make you feel nauseous or bloated.  Likewise, meals heavy in dairy and vegetables can increase bloating.  Drink plenty of fluids but you should avoid alcoholic beverages for 24  hours.  ACTIVITY:  You should plan to take it easy for the rest of today and you should NOT DRIVE or use heavy machinery until tomorrow (because of the sedation medicines used during the test).    FOLLOW UP: Our staff will call the number listed on your records the next business day following your procedure to check on you and address any questions or concerns that you may have regarding the information given to you following your procedure. If we do not reach you, we will leave a message.  However, if you are feeling well and you are not experiencing any problems, there is no need to return our call.  We will assume that you have returned to your regular daily activities without incident.  If any biopsies were taken you will be contacted by phone or by letter within the next 1-3 weeks.  Please call us at [phone] if you have not heard about the biopsies in 3 weeks.    SIGNATURES/CONFIDENTIALITY: You and/or your care partner have signed paperwork which will be entered into your electronic medical record.  These signatures attest to the fact that that the information above on your After Visit Summary has been reviewed and is understood.  Full responsibility of the confidentiality of this discharge information lies with you and/or your care-partner.    INFORMATION ON POLYPS,DIVERTICULOSIS,&HIGH FIBER DIET GIVEN TO YOU TODAY 

## 2015-06-17 NOTE — Progress Notes (Signed)
A/ox3 pleased with MAC, report to Penny RN 

## 2015-06-18 ENCOUNTER — Telehealth: Payer: Self-pay | Admitting: *Deleted

## 2015-06-18 NOTE — Telephone Encounter (Signed)
  Follow up Call-  Call back number 06/17/2015  Post procedure Call Back phone  # [phone]  Permission to leave phone message Yes     Patient questions:  Do you have a fever, pain , or abdominal swelling? No. Pain Score  0 *  Have you tolerated food without any problems? Yes.    Have you been able to return to your normal activities? Yes.    Do you have any questions about your discharge instructions: Diet   No. Medications  No. Follow up visit  No.  Do you have questions or concerns about your Care? No.  Actions: * If pain score is 4 or above: No action needed, pain <4.

## 2015-06-23 ENCOUNTER — Encounter: Payer: Self-pay | Admitting: Internal Medicine

## 2015-09-22 ENCOUNTER — Ambulatory Visit (INDEPENDENT_AMBULATORY_CARE_PROVIDER_SITE_OTHER): Payer: 59 | Admitting: Physician Assistant

## 2015-09-22 ENCOUNTER — Encounter: Payer: Self-pay | Admitting: Physician Assistant

## 2015-09-22 VITALS — BP 139/63 | HR 98 | Temp 98.1°F | Ht 73.0 in | Wt 163.4 lb

## 2015-09-22 DIAGNOSIS — J309 Allergic rhinitis, unspecified: Secondary | ICD-10-CM

## 2015-09-22 DIAGNOSIS — M545 Low back pain, unspecified: Secondary | ICD-10-CM

## 2015-09-22 DIAGNOSIS — Z23 Encounter for immunization: Secondary | ICD-10-CM | POA: Insufficient documentation

## 2015-09-22 DIAGNOSIS — Z720 Tobacco use: Secondary | ICD-10-CM

## 2015-09-22 DIAGNOSIS — N529 Male erectile dysfunction, unspecified: Secondary | ICD-10-CM | POA: Insufficient documentation

## 2015-09-22 DIAGNOSIS — F17200 Nicotine dependence, unspecified, uncomplicated: Secondary | ICD-10-CM

## 2015-09-22 DIAGNOSIS — M5090 Cervical disc disorder, unspecified, unspecified cervical region: Secondary | ICD-10-CM

## 2015-09-22 HISTORY — DX: Allergic rhinitis, unspecified: J30.9

## 2015-09-22 HISTORY — DX: Encounter for immunization: Z23

## 2015-09-22 HISTORY — DX: Cervical disc disorder, unspecified, unspecified cervical region: M50.90

## 2015-09-22 HISTORY — DX: Low back pain, unspecified: M54.50

## 2015-09-22 HISTORY — DX: Male erectile dysfunction, unspecified: N52.9

## 2015-09-22 MED ORDER — FLUTICASONE PROPIONATE 50 MCG/ACT NA SUSP: 2.0000 | Freq: Every day | NASAL | Status: DC

## 2015-09-22 MED ORDER — TRAMADOL HCL 50 MG PO TABS: 50.0000 mg | ORAL_TABLET | Freq: Two times a day (BID) | ORAL | Status: DC | PRN

## 2015-09-22 MED ORDER — SILDENAFIL CITRATE 25 MG PO TABS
25.0000 mg | ORAL_TABLET | Freq: Every day | ORAL | Status: DC | PRN
Start: 2015-09-22 — End: 2016-03-04

## 2015-09-22 NOTE — Assessment & Plan Note (Signed)
Zostavax given by nursing staff. 

## 2015-09-22 NOTE — Assessment & Plan Note (Signed)
Age-related. Discussed options. Will start low-dose PRN Viagra. He is to follow-up with PCP for ongoing medication management.

## 2015-09-22 NOTE — Addendum Note (Signed)
Addended by: Harl Bowie on: 09/22/2015 02:23 PM   Modules accepted: Orders

## 2015-09-22 NOTE — Assessment & Plan Note (Signed)
Chronic. Patient with scoliosis noted on examination. X-ray from last year reveals significant arthritic changes in the cervical spine. Concern for nerve impingement. Discussed imaging versus referral. Patient elects referral which has been placed. Rx Tramadol for pain. Supportive measures reviewed. Follow-up with PCP in 1 month.

## 2015-09-22 NOTE — Progress Notes (Signed)
Patient presents to clinic today c/o chronic cervical back and neck pain over the past few years. Endorses worsening of pain over the past few months with radiation into the upper extremities bilaterally intermittently. Notes intermittent numbness and tingling of upper extremities. Has not taken anything for symptoms.  Endorses low back pain without radiation over the past few weeks. Denies saddle anesthesia or change in bowel habits. Denies trauma or injury.   Endorses intermittent nasal congestion with PND and rhinorrhea. Endorses no relief with claritin. This has been going on for several months. Has significant history of allergies.  Patient complains of erectile dysfunction present for > 5 years and worsening gradually. Would like to discuss options to help with erection sufficient for intercourse.  Patient is requesting shingles vaccination today. Endorses history of the chicken pox. Has checked with insurance who told him would be covered at [place] office.  Patient is requesting a low-dose CT of the chest to screen for lung cancer. He is a > 40 year smoker. Denies history of lung nodule. Denies hx of COPD. Denies chronic cough or SOB.   Past Medical History  Diagnosis Date  . Arthritis   . Glaucoma   . Allergy   . Blood transfusion without reported diagnosis   . Chicken pox     Current Outpatient Prescriptions on File Prior to Visit  Medication Sig Dispense Refill  . IBUPROFEN PO Take by mouth as needed.    . Multiple Vitamin (MULTIVITAMIN) tablet Take 1 tablet by mouth daily.    . TRAVATAN Z 0.004 % SOLN ophthalmic solution 1 drop each eye daily  1  . OVER THE COUNTER MEDICATION Reported on 09/22/2015     No current facility-administered medications on file prior to visit.    No Known Allergies  Family History  Problem Relation Age of Onset  . Cancer Mother 24    history of colon cancer  . Rosacea Mother   . Colon cancer Mother   . Rosacea Father   . Cancer Cousin      colon  . Colon cancer Cousin   . Esophageal cancer Neg Hx   . Rectal cancer Neg Hx   . Stomach cancer Neg Hx   . Colon cancer Maternal Grandmother     Social History   Social History  . Marital Status: Married    Spouse Name: N/A  . Number of Children: N/A  . Years of Education: N/A   Social History Main Topics  . Smoking status: Current Every Day Smoker -- 0.50 packs/day    Types: Cigarettes  . Smokeless tobacco: Never Used     Comment: 15-20 cigarettes daily  . Alcohol Use: 7.2 - 9.6 oz/week    12-16 Cans of beer per week  . Drug Use: No  . Sexual Activity: Not Asked   Other Topics Concern  . None   Social History Narrative   Quality control Tech- gauges/callibrations.     Some colleg/tech school   Married   3 grown children (oldest daughter is living with them) youngest daughter lives in [place].  Curator at [employer].  Son lives near [place]- framing/art.   Review of Systems - See HPI.  All other ROS are negative.  BP 139/63 mmHg  Pulse 98  Temp(Src) 98.1 F (36.7 C) (Oral)  Ht 6\' 1"  (1.854 m)  Wt 163 lb 6.4 oz (74.118 kg)  BMI 21.56 kg/m2  SpO2 97%  Physical Exam  Constitutional: He is oriented to person,  place, and time and well-developed, well-nourished, and in no distress.  HENT:  Head: Normocephalic and atraumatic.  Right Ear: Tympanic membrane normal.  Left Ear: Tympanic membrane normal.  Nose: Rhinorrhea present. Right sinus exhibits no maxillary sinus tenderness and no frontal sinus tenderness. Left sinus exhibits no maxillary sinus tenderness and no frontal sinus tenderness.  Mouth/Throat: Uvula is midline, oropharynx is clear and moist and mucous membranes are normal.  Eyes: Conjunctivae are normal.  Neck: Neck supple.  Cardiovascular: Normal rate, regular rhythm, normal heart sounds and intact distal pulses.   Pulmonary/Chest: Effort normal and breath sounds normal. No respiratory distress. He has no wheezes. He has no rales. He  exhibits no tenderness.  Musculoskeletal:       Cervical back: He exhibits tenderness and pain. He exhibits no bony tenderness.       Lumbar back: He exhibits tenderness and pain. He exhibits no bony tenderness.  Neurological: He is alert and oriented to person, place, and time.  Skin: Skin is warm and dry. No rash noted.  Psychiatric: Affect normal.  Vitals reviewed.   No results found for this or any previous visit (from the past 2160 hour(s)).  Assessment/Plan: Smoker > 40 year smoking history. Is a candidate for low-dose CT to screen for lung cancer. Order placed.  Rhinitis, allergic Will begin Flonase daily. Humidifier in bedroom. Consider claritin. Follow-up with PCP if not resolving.  Need for shingles vaccine Zostavax given by nursing staff.  Erectile dysfunction Age-related. Discussed options. Will start low-dose PRN Viagra. He is to follow-up with PCP for ongoing medication management.  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 Patient with scoliosis noted on examination. X-ray from last year reveals significant arthritic changes in the cervical spine. Concern for nerve impingement. Discussed imaging versus referral. Patient elects referral which has been placed. Rx Tramadol for pain. Supportive measures reviewed. Follow-up with PCP in 1 month.  Bilateral low back pain without sciatica Chronic with flare of symptoms. Rx Tramadol for pain. Stretching exercises and supportive measures reviewed. Follow-up if not resolving. Biggest issue is spinal alignment. Patient has been referred to Orthopedics for ongoing management.

## 2015-09-22 NOTE — Assessment & Plan Note (Signed)
>   40 year smoking history. Is a candidate for low-dose CT to screen for lung cancer. Order placed.

## 2015-09-22 NOTE — Assessment & Plan Note (Signed)
>>  ASSESSMENT AND PLAN FOR SMOKER WRITTEN ON 09/22/2015  1:08 PM BY Marcelline Mates C, PA-C  > 40 year smoking history. Is a candidate for low-dose CT to screen for lung cancer. Order placed.

## 2015-09-22 NOTE — Assessment & Plan Note (Addendum)
Chronic with flare of symptoms. Rx Tramadol for pain. Stretching exercises and supportive measures reviewed. Follow-up if not resolving. Biggest issue is spinal alignment. Patient has been referred to Orthopedics for ongoing management.

## 2015-09-22 NOTE — Progress Notes (Signed)
Pre visit review using our clinic review tool, if applicable. No additional management support is needed unless otherwise documented below in the visit note. 

## 2015-09-22 NOTE — Patient Instructions (Signed)
Please take the Tramadol as directed for pain.  You will be contacted by Orthopedic Surgery for assessment.  Use the Viagra as directed. As stated at your visit, it will be up to your PCP to determine continued therapy.  Start the Flonase nasal spray as directed.  You will be contacted to schedule a low-dose CT to screen for lung cancer.   Follow-up with Melissa in 1 month.

## 2015-09-22 NOTE — Assessment & Plan Note (Signed)
Will begin Flonase daily. Humidifier in bedroom. Consider claritin. Follow-up with PCP if not resolving.

## 2015-09-25 ENCOUNTER — Ambulatory Visit (HOSPITAL_BASED_OUTPATIENT_CLINIC_OR_DEPARTMENT_OTHER)
Admission: RE | Admit: 2015-09-25 | Discharge: 2015-09-25 | Disposition: A | Discharge status: Home / Self Care | Payer: 59 | Source: Ambulatory Visit | Attending: Physician Assistant | Admitting: Physician Assistant

## 2015-09-25 ENCOUNTER — Telehealth: Payer: Self-pay | Admitting: Family

## 2015-09-25 DIAGNOSIS — F17210 Nicotine dependence, cigarettes, uncomplicated: Secondary | ICD-10-CM | POA: Insufficient documentation

## 2015-09-25 DIAGNOSIS — Z122 Encounter for screening for malignant neoplasm of respiratory organs: Secondary | ICD-10-CM | POA: Diagnosis not present

## 2015-09-25 DIAGNOSIS — I270 Primary pulmonary hypertension: Secondary | ICD-10-CM | POA: Diagnosis not present

## 2015-09-25 DIAGNOSIS — M5412 Radiculopathy, cervical region: Secondary | ICD-10-CM

## 2015-09-25 DIAGNOSIS — I700 Atherosclerosis of aorta: Secondary | ICD-10-CM | POA: Insufficient documentation

## 2015-09-25 DIAGNOSIS — F17200 Nicotine dependence, unspecified, uncomplicated: Secondary | ICD-10-CM

## 2015-09-25 DIAGNOSIS — G8929 Other chronic pain: Secondary | ICD-10-CM

## 2015-09-25 DIAGNOSIS — M542 Cervicalgia: Secondary | ICD-10-CM

## 2015-09-25 NOTE — Telephone Encounter (Signed)
No we discussed imaging versus referral and decided on a referral to Orthopedic Surgery which has been placed. He will be contacted to schedule an appointment. Please check on the status of referral for me.

## 2015-09-25 NOTE — Telephone Encounter (Signed)
I have placed order

## 2015-09-25 NOTE — Telephone Encounter (Signed)
Patient had CT lung screening today, states that Brett [name] was to order MRI for Cervical pain. Please advise.

## 2015-09-25 NOTE — Telephone Encounter (Signed)
Patient states he told [facility] to hold on referral until MRI was done, is wanting MRI before he sees [place]. Is this something you will do?

## 2015-09-27 ENCOUNTER — Ambulatory Visit (HOSPITAL_BASED_OUTPATIENT_CLINIC_OR_DEPARTMENT_OTHER)
Admission: RE | Admit: 2015-09-27 | Discharge: 2015-09-27 | Disposition: A | Discharge status: Home / Self Care | Payer: 59 | Source: Ambulatory Visit | Attending: Physician Assistant | Admitting: Physician Assistant

## 2015-09-27 DIAGNOSIS — G8929 Other chronic pain: Secondary | ICD-10-CM | POA: Insufficient documentation

## 2015-09-27 DIAGNOSIS — M47892 Other spondylosis, cervical region: Secondary | ICD-10-CM | POA: Diagnosis not present

## 2015-09-27 DIAGNOSIS — M5412 Radiculopathy, cervical region: Secondary | ICD-10-CM | POA: Insufficient documentation

## 2015-09-27 DIAGNOSIS — M5030 Other cervical disc degeneration, unspecified cervical region: Secondary | ICD-10-CM | POA: Insufficient documentation

## 2015-09-27 DIAGNOSIS — M542 Cervicalgia: Secondary | ICD-10-CM

## 2015-09-28 ENCOUNTER — Telehealth: Payer: Self-pay | Admitting: *Deleted

## 2015-09-28 DIAGNOSIS — M5010 Cervical disc disorder with radiculopathy, unspecified cervical region: Secondary | ICD-10-CM

## 2015-09-28 DIAGNOSIS — M4802 Spinal stenosis, cervical region: Secondary | ICD-10-CM

## 2015-09-28 NOTE — Telephone Encounter (Signed)
Referral placed.

## 2015-09-28 NOTE — Telephone Encounter (Signed)
Called and spoke with the pt and informed him of recent MRI results and note.  Pt verbalized understanding.  Pt agreed to the referral.//AB/CMA

## 2015-09-28 NOTE — Telephone Encounter (Signed)
-----   Message from Brunetta Jeans, PA-C sent at 09/27/2015  8:03 PM EST ----- MRI reveals moderate arthritis in cervical spine along with some areas where the opening in the vertebrae is narrowed, causing nerve irritation. I would like to set him up with Neurosurgery instead of orthopedics as I feel they will be able to do more for him.

## 2015-10-07 DIAGNOSIS — G542 Cervical root disorders, not elsewhere classified: Secondary | ICD-10-CM | POA: Insufficient documentation

## 2015-10-23 ENCOUNTER — Ambulatory Visit (INDEPENDENT_AMBULATORY_CARE_PROVIDER_SITE_OTHER): Payer: 59 | Admitting: Family

## 2015-10-23 ENCOUNTER — Encounter: Payer: Self-pay | Admitting: Family

## 2015-10-23 VITALS — BP 132/72 | HR 90 | Temp 97.8°F | Ht 73.0 in | Wt 164.8 lb

## 2015-10-23 DIAGNOSIS — Z1159 Encounter for screening for other viral diseases: Secondary | ICD-10-CM

## 2015-10-23 DIAGNOSIS — R634 Abnormal weight loss: Secondary | ICD-10-CM

## 2015-10-23 DIAGNOSIS — M5090 Cervical disc disorder, unspecified, unspecified cervical region: Secondary | ICD-10-CM

## 2015-10-23 LAB — HEPATITIS C ANTIBODY: HCV Ab: NEGATIVE

## 2015-10-23 MED ORDER — SILDENAFIL CITRATE 20 MG PO TABS: ORAL_TABLET | ORAL | Status: DC

## 2015-10-23 MED ORDER — TRAMADOL HCL 50 MG PO TABS: 50.0000 mg | ORAL_TABLET | Freq: Two times a day (BID) | ORAL | Status: DC | PRN

## 2015-10-23 NOTE — Assessment & Plan Note (Signed)
Uncontrolled. Pt is following with neurosurgery. I did give him rx for tramadol, but we discussed that if he requires long term Rx with tramadol that it will need to come from Neurosurgery. He verbalizes understanding.

## 2015-10-23 NOTE — Progress Notes (Signed)
Subjective:    Patient ID: Brett [name], male    DOB: Sep 05, 1955, 60 y.o.   MRN: [id number]  HPI   Brett [name] is a 60 yr old male who presents today for follow up of his neck pain.  Pt saw neurosurgery, Dr. Hal Neer. He was given NSAID (nabumetome)  and muscle relaxer HS.  He reports that his helps his pain but pain is not resolved.  He has follow up with them in 1 month. Pain is located on the right side of the neck. Has trouble getting comfortable at night on his pillow. Denies bowel/bladder issues or dizziness.    Wife passed away  In 04/27/23. Had some associated weight loss after her death. He has been drinking boost and has managed to gain a few pounds back. His daughter is living with him and he finds her helpful.  Reports that it has been really difficult for him but he is glad his wife is no longer suffering.  Wt Readings from Last 3 Encounters:  10/23/15 164 lb 12.8 oz (74.753 kg)  09/22/15 163 lb 6.4 oz (74.118 kg)  06/17/15 181 lb (82.101 kg)    Review of Systems    see HPI  Past Medical History  Diagnosis Date  . Arthritis   . Glaucoma   . Allergy   . Blood transfusion without reported diagnosis   . Chicken pox     Social History   Social History  . Marital Status: Widowed    Spouse Name: N/A  . Number of Children: N/A  . Years of Education: N/A   Occupational History  . Not on file.   Social History Main Topics  . Smoking status: Current Every Day Smoker -- 0.50 packs/day    Types: Cigarettes  . Smokeless tobacco: Never Used     Comment: 15-20 cigarettes daily  . Alcohol Use: 7.2 - 9.6 oz/week    12-16 Cans of beer per week  . Drug Use: No  . Sexual Activity: Not on file   Other Topics Concern  . 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    Past Surgical History  Procedure Laterality Date  . Hip surgery  1986 & 2010    rt total hip-8/10-multiple rt hip surgeries- had 4 prior to 1986 as a child  . Thumb arthroscopy  2008    rt  . Hammer toe surgery  07/28/2011    Procedure: HAMMER TOE CORRECTION;  Surgeon: Ninetta Lights, MD;  Location: [facility];  Service: Orthopedics;  Laterality: Right;  right 2nd and 4th toes correction hammer toe, capsulotomy metatarsal-phalangeal joints  . Colonoscopy      Family History  Problem Relation Age of Onset  . Cancer Mother 3    history of colon cancer  . Rosacea Mother   . Colon cancer Mother   . Rosacea Father   . Cancer Cousin     colon  . Colon cancer Cousin   . Esophageal cancer Neg Hx   . Rectal cancer Neg Hx   . Stomach cancer Neg Hx   . Colon cancer Maternal Grandmother     No Known Allergies  Current Outpatient Prescriptions on File Prior to Visit  Medication Sig Dispense Refill  . fluticasone (FLONASE) 50 MCG/ACT nasal spray Place 2 sprays into both nostrils daily. 16 g 6  . IBUPROFEN PO Take by mouth as needed.    . Multiple Vitamin (MULTIVITAMIN) tablet Take 1 tablet by mouth daily.    [name] OVER THE COUNTER MEDICATION Reported on 09/22/2015    . sildenafil (VIAGRA) 25 MG tablet Take 1 tablet (25 mg total) by mouth daily as needed for erectile dysfunction. 10 tablet 0  . TRAVATAN Z 0.004 % SOLN ophthalmic solution 1 drop each eye daily  1   No current facility-administered medications on file prior to visit.    BP 132/72 mmHg  Pulse 90  Temp(Src) 97.8 F (36.6 C) (Oral)  Ht 6\' 1"  (1.854 m)  Wt 164 lb 12.8 oz (74.753 kg)  BMI 21.75 kg/m2    Objective:   Physical Exam  Constitutional: He is oriented to person, place, and time.  Thin appearing white male, NAD  Cardiovascular: Normal rate and regular rhythm.   No murmur heard. Pulmonary/Chest: Effort normal and breath sounds normal. No  respiratory distress. He has no wheezes. He has no rales. He exhibits no tenderness.  Musculoskeletal: He exhibits no edema.  Neurological: He is alert and oriented to person, place, and time.  Skin: Skin is warm and dry.  Psychiatric: He has a normal mood and affect. His behavior is normal. Judgment and thought content normal.          Assessment & Plan:  Weight loss- related to recent loss of spouse. Starting to trend back up.

## 2015-10-23 NOTE — Progress Notes (Signed)
Pre visit review using our clinic review tool, if applicable. No additional management support is needed unless otherwise documented below in the visit note. 

## 2015-10-23 NOTE — Patient Instructions (Signed)
Please keep your upcoming appointment with Dr. Hal Neer. Follow up in July for your annual physical.

## 2016-02-19 ENCOUNTER — Encounter: Payer: 59 | Admitting: Family

## 2016-03-04 ENCOUNTER — Ambulatory Visit (INDEPENDENT_AMBULATORY_CARE_PROVIDER_SITE_OTHER): Payer: 59 | Admitting: Family

## 2016-03-04 ENCOUNTER — Encounter: Payer: Self-pay | Admitting: Family

## 2016-03-04 VITALS — BP 128/84 | HR 76 | Temp 98.1°F | Resp 16 | Ht 73.0 in | Wt 165.2 lb

## 2016-03-04 DIAGNOSIS — Z0000 Encounter for general adult medical examination without abnormal findings: Secondary | ICD-10-CM | POA: Diagnosis not present

## 2016-03-04 LAB — URINALYSIS, ROUTINE W REFLEX MICROSCOPIC
Bilirubin Urine: NEGATIVE
HGB URINE DIPSTICK: NEGATIVE
Leukocytes, UA: NEGATIVE
NITRITE: NEGATIVE
RBC / HPF: NONE SEEN (ref 0–?)
Total Protein, Urine: NEGATIVE
URINE GLUCOSE: NEGATIVE
Urobilinogen, UA: 0.2 (ref 0.0–1.0)
WBC UA: NONE SEEN (ref 0–?)
pH: 6 (ref 5.0–8.0)

## 2016-03-04 LAB — LIPID PANEL
Cholesterol: 172 mg/dL (ref 0–200)
HDL: 77.6 mg/dL (ref >39.00)
LDL Cholesterol: 83 mg/dL (ref 0–99)
NONHDL: 94.61
TRIGLYCERIDES: 59 mg/dL (ref 0.0–149.0)
Total CHOL/HDL Ratio: 2
VLDL: 11.8 mg/dL (ref 0.0–40.0)

## 2016-03-04 LAB — TSH: TSH: 1.07 u[IU]/mL (ref 0.35–4.50)

## 2016-03-04 LAB — BASIC METABOLIC PANEL
BUN: 10 mg/dL (ref 6–23)
CALCIUM: 9.7 mg/dL (ref 8.4–10.5)
CO2: 31 meq/L (ref 19–32)
CREATININE: 0.85 mg/dL (ref 0.40–1.50)
Chloride: 100 mEq/L (ref 96–112)
GFR: 97.17 mL/min (ref >60.00)
GLUCOSE: 95 mg/dL (ref 70–99)
Potassium: 4.6 mEq/L (ref 3.5–5.1)
Sodium: 137 mEq/L (ref 135–145)

## 2016-03-04 LAB — CBC WITH DIFFERENTIAL/PLATELET
BASOS ABS: 0.1 10*3/uL (ref 0.0–0.1)
Basophils Relative: 0.7 % (ref 0.0–3.0)
Eosinophils Absolute: 0.2 10*3/uL (ref 0.0–0.7)
Eosinophils Relative: 2.2 % (ref 0.0–5.0)
HCT: 46.8 % (ref 39.0–52.0)
Hemoglobin: 15.9 g/dL (ref 13.0–17.0)
Lymphocytes Relative: 24.9 % (ref 12.0–46.0)
Lymphs Abs: 2.6 10*3/uL (ref 0.7–4.0)
MCHC: 34 g/dL (ref 30.0–36.0)
MCV: 97.6 fl (ref 78.0–100.0)
Monocytes Absolute: 0.7 10*3/uL (ref 0.1–1.0)
Monocytes Relative: 7.1 % (ref 3.0–12.0)
NEUTROS ABS: 6.8 10*3/uL (ref 1.4–7.7)
Neutrophils Relative %: 65.1 % (ref 43.0–77.0)
PLATELETS: 197 10*3/uL (ref 150.0–400.0)
RBC: 4.79 Mil/uL (ref 4.22–5.81)
RDW: 12.6 % (ref 11.5–15.5)
WBC: 10.5 10*3/uL (ref 4.0–10.5)

## 2016-03-04 LAB — HEPATIC FUNCTION PANEL
ALK PHOS: 50 U/L (ref 39–117)
ALT: 18 U/L (ref 0–53)
AST: 21 U/L (ref 0–37)
Albumin: 4.4 g/dL (ref 3.5–5.2)
BILIRUBIN DIRECT: 0.1 mg/dL (ref 0.0–0.3)
Total Bilirubin: 0.6 mg/dL (ref 0.2–1.2)
Total Protein: 6.8 g/dL (ref 6.0–8.3)

## 2016-03-04 LAB — PSA: PSA: 3.95 ng/mL (ref 0.10–4.00)

## 2016-03-04 NOTE — Progress Notes (Signed)
Subjective:    Patient ID: Brett [name], male    DOB: 02/18/1956, 60 y.o.   MRN: [id number]  HPI  Brett [name] is a 60 yr old male who presents today for cpx. He is coming up on the anniversary of his wife's death.  His 27 yr old daughter is moving out of his home.  He feels like he needs some time on his own to heal and get used to living alone.   Immunizations: up to date Diet: reports healthy diet Exercise: no formal exercise Colonoscopy: 11/16 Tobacco abuse- not motivated to quit. Reports 2 drinks a day   Review of Systems  Constitutional: Negative for unexpected weight change.  HENT: Positive for rhinorrhea. Negative for hearing loss.   Eyes: Positive for visual disturbance.       Reports that he is following with opthalmology for glaucoma (Dr. Charma Igo)  Respiratory: Negative for chest tightness and shortness of breath.   Cardiovascular: Negative for chest pain.  Gastrointestinal: Negative for blood in stool, constipation and diarrhea.  Genitourinary: Negative for dysuria and frequency.  Musculoskeletal: Positive for back pain and neck pain.       Chronic hip pain  Skin: Negative for rash.  Neurological: Negative for headaches.  Hematological: Negative for adenopathy.  Psychiatric/Behavioral:       Denies depression/anxiety   Past Medical History:  Diagnosis Date  . Allergy   . Arthritis   . Blood transfusion without reported diagnosis   . Chicken pox   . Glaucoma      Social History   Social History  . Marital status: Widowed    Spouse name: N/A  . Number of children: N/A  . Years of education: N/A   Occupational History  . Not on file.   Social History Main Topics  . Smoking status: Current Every Day Smoker    Packs/day: 0.50    Types: Cigarettes  . Smokeless tobacco: Never Used     Comment: 15-20 cigarettes daily  . Alcohol use 7.2 - 9.6 oz/week    12 - 16 Cans of beer per week  . Drug use: No  . Sexual activity: Not on file    Other Topics Concern  . Not on file   Social History Narrative   Quality control Tech- gauges/callibrations.     Some colleg/tech school   Married   3 grown children (oldest daughter is living with them) youngest daughter lives in [place].  Curator at [employer].  Son lives near [place]- framing/art.    Past Surgical History:  Procedure Laterality Date  . COLONOSCOPY    . HAMMER TOE SURGERY  07/28/2011   Procedure: HAMMER TOE CORRECTION;  Surgeon: Ninetta Lights, MD;  Location: [facility];  Service: Orthopedics;  Laterality: Right;  right 2nd and 4th toes correction hammer toe, capsulotomy metatarsal-phalangeal joints  . [place] 2010   rt total hip-8/10-multiple rt hip surgeries- had 4 prior to 1986 as a child  . THUMB ARTHROSCOPY  2008   rt    Family History  Problem Relation Age of Onset  . Cancer Mother 63    history of colon cancer  . Rosacea Mother   . Colon cancer Mother   . Rosacea Father   . Cancer Cousin     colon  . Colon cancer Cousin   . Colon cancer Maternal Grandmother   . Esophageal cancer Neg Hx   . Rectal cancer Neg Hx   .  Stomach cancer Neg Hx     No Known Allergies  Current Outpatient Prescriptions on File Prior to Visit  Medication Sig Dispense Refill  . fluticasone (FLONASE) 50 MCG/ACT nasal spray Place 2 sprays into both nostrils daily. 16 g 6  . IBUPROFEN PO Take by mouth as needed.    . Multiple Vitamin (MULTIVITAMIN) tablet Take 1 tablet by mouth daily.    . TRAVATAN Z 0.004 % SOLN ophthalmic solution 1 drop each eye daily  1   No current facility-administered medications on file prior to visit.     BP 128/84   Pulse 76   Temp 98.1 F (36.7 C) (Oral)   Resp 16   Ht 6\' 1"  (1.854 m)   Wt 165 lb 3.2 oz (74.9 kg)   SpO2 96% Comment: room air  BMI 21.80 kg/m       Objective:   Physical Exam  Physical Exam  Constitutional: He is oriented to person, place, and time. He appears well-developed  and well-nourished. No distress.  HENT:  Head: Normocephalic and atraumatic.  Right Ear: Tympanic membrane and ear canal normal.  Left Ear: Tympanic membrane and ear canal normal.  Mouth/Throat: Oropharynx is clear and moist.  Eyes: Pupils are equal, round, and reactive to light. No scleral icterus.  Neck: Normal range of motion. No thyromegaly present.  Cardiovascular: Normal rate and regular rhythm.   No murmur heard. Pulmonary/Chest: Effort normal and breath sounds normal. No respiratory distress. He has no wheezes. He has no rales. He exhibits no tenderness.  Abdominal: Soft. Bowel sounds are normal. He exhibits no distension and no mass. There is no tenderness. There is no rebound and no guarding.  Musculoskeletal: He exhibits no edema.  Lymphadenopathy:    He has no cervical adenopathy.  Neurological: He is alert and oriented to person, place, and time. He exhibits normal muscle tone. Coordination normal.  Skin: Skin is warm and dry.  Psychiatric: He has a normal mood and affect. His behavior is normal. Judgment and thought content normal.          Assessment & Plan:         Assessment & Plan:  EKG tracing is personally reviewed.  EKG notes NSR.  No acute changes.

## 2016-03-04 NOTE — Patient Instructions (Signed)
Please complete lab work prior to leaving. Try to add some regular cardio exercise such as walking.  Work on quitting smoking. When you are ready let me know and we can work with you to help you.

## 2016-03-04 NOTE — Progress Notes (Signed)
Pre visit review using our clinic review tool, if applicable. No additional management support is needed unless otherwise documented below in the visit note. 

## 2016-03-04 NOTE — Assessment & Plan Note (Signed)
Discussed healthy diet, exercise, smoking cessation.  Obtain routine lab orders. EKG tracing is personally reviewed.  EKG notes NSR.  No acute changes.

## 2016-03-05 LAB — HIV ANTIBODY (ROUTINE TESTING W REFLEX): HIV: NONREACTIVE

## 2016-03-08 ENCOUNTER — Other Ambulatory Visit: Payer: Self-pay | Admitting: Family

## 2016-03-08 DIAGNOSIS — R972 Elevated prostate specific antigen [PSA]: Secondary | ICD-10-CM

## 2016-06-10 ENCOUNTER — Other Ambulatory Visit (INDEPENDENT_AMBULATORY_CARE_PROVIDER_SITE_OTHER): Payer: 59

## 2016-06-10 DIAGNOSIS — R972 Elevated prostate specific antigen [PSA]: Secondary | ICD-10-CM

## 2016-06-10 LAB — PSA: PSA: 2.32 ng/mL (ref 0.10–4.00)

## 2016-10-04 DIAGNOSIS — H401133 Primary open-angle glaucoma, bilateral, severe stage: Secondary | ICD-10-CM | POA: Diagnosis not present

## 2016-10-05 DIAGNOSIS — H401133 Primary open-angle glaucoma, bilateral, severe stage: Secondary | ICD-10-CM | POA: Diagnosis not present

## 2017-02-01 DIAGNOSIS — H401133 Primary open-angle glaucoma, bilateral, severe stage: Secondary | ICD-10-CM | POA: Diagnosis not present

## 2017-03-10 ENCOUNTER — Encounter: Payer: 59 | Admitting: Family

## 2017-03-24 ENCOUNTER — Other Ambulatory Visit: Payer: Self-pay | Admitting: Family

## 2017-03-24 ENCOUNTER — Ambulatory Visit (INDEPENDENT_AMBULATORY_CARE_PROVIDER_SITE_OTHER): Payer: 59 | Admitting: Family

## 2017-03-24 ENCOUNTER — Encounter: Payer: Self-pay | Admitting: Family

## 2017-03-24 ENCOUNTER — Telehealth: Payer: Self-pay | Admitting: Family

## 2017-03-24 VITALS — BP 123/69 | HR 70 | Temp 98.1°F | Resp 18 | Ht 73.0 in | Wt 169.2 lb

## 2017-03-24 DIAGNOSIS — Z0000 Encounter for general adult medical examination without abnormal findings: Secondary | ICD-10-CM

## 2017-03-24 DIAGNOSIS — Z720 Tobacco use: Secondary | ICD-10-CM

## 2017-03-24 DIAGNOSIS — J019 Acute sinusitis, unspecified: Secondary | ICD-10-CM | POA: Diagnosis not present

## 2017-03-24 LAB — URINALYSIS, ROUTINE W REFLEX MICROSCOPIC
Bilirubin Urine: NEGATIVE
Hgb urine dipstick: NEGATIVE
Ketones, ur: NEGATIVE
Leukocytes, UA: NEGATIVE
NITRITE: NEGATIVE
RBC / HPF: NONE SEEN (ref 0–?)
Total Protein, Urine: NEGATIVE
Urine Glucose: NEGATIVE
Urobilinogen, UA: 0.2 (ref 0.0–1.0)
WBC, UA: NONE SEEN (ref 0–?)
pH: 6 (ref 5.0–8.0)

## 2017-03-24 LAB — LIPID PANEL
CHOL/HDL RATIO: 3
CHOLESTEROL: 160 mg/dL (ref 0–200)
HDL: 62.5 mg/dL (ref >39.00)
LDL CALC: 83 mg/dL (ref 0–99)
NonHDL: 97.67
TRIGLYCERIDES: 74 mg/dL (ref 0.0–149.0)
VLDL: 14.8 mg/dL (ref 0.0–40.0)

## 2017-03-24 LAB — CBC WITH DIFFERENTIAL/PLATELET
Basophils Absolute: 0.1 10*3/uL (ref 0.0–0.1)
Basophils Relative: 0.9 % (ref 0.0–3.0)
EOS ABS: 0.1 10*3/uL (ref 0.0–0.7)
EOS PCT: 1.7 % (ref 0.0–5.0)
HCT: 48.4 % (ref 39.0–52.0)
HEMOGLOBIN: 16.2 g/dL (ref 13.0–17.0)
LYMPHS ABS: 2.5 10*3/uL (ref 0.7–4.0)
Lymphocytes Relative: 29.3 % (ref 12.0–46.0)
MCHC: 33.6 g/dL (ref 30.0–36.0)
MCV: 100.3 fl — ABNORMAL HIGH (ref 78.0–100.0)
MONO ABS: 0.6 10*3/uL (ref 0.1–1.0)
Monocytes Relative: 6.7 % (ref 3.0–12.0)
NEUTROS PCT: 61.4 % (ref 43.0–77.0)
Neutro Abs: 5.2 10*3/uL (ref 1.4–7.7)
Platelets: 199 10*3/uL (ref 150.0–400.0)
RBC: 4.83 Mil/uL (ref 4.22–5.81)
RDW: 12.6 % (ref 11.5–15.5)
WBC: 8.4 10*3/uL (ref 4.0–10.5)

## 2017-03-24 LAB — BASIC METABOLIC PANEL
BUN: 10 mg/dL (ref 6–23)
CALCIUM: 9.2 mg/dL (ref 8.4–10.5)
CO2: 29 mEq/L (ref 19–32)
CREATININE: 0.88 mg/dL (ref 0.40–1.50)
Chloride: 102 mEq/L (ref 96–112)
GFR: 93.04 mL/min (ref >60.00)
GLUCOSE: 98 mg/dL (ref 70–99)
POTASSIUM: 4.2 meq/L (ref 3.5–5.1)
Sodium: 137 mEq/L (ref 135–145)

## 2017-03-24 LAB — HEPATIC FUNCTION PANEL
ALT: 18 U/L (ref 0–53)
AST: 23 U/L (ref 0–37)
Albumin: 4 g/dL (ref 3.5–5.2)
Alkaline Phosphatase: 47 U/L (ref 39–117)
BILIRUBIN DIRECT: 0.2 mg/dL (ref 0.0–0.3)
BILIRUBIN TOTAL: 0.5 mg/dL (ref 0.2–1.2)
Total Protein: 6.6 g/dL (ref 6.0–8.3)

## 2017-03-24 LAB — TSH: TSH: 1.17 u[IU]/mL (ref 0.35–4.50)

## 2017-03-24 MED ORDER — B COMPLEX VITAMINS PO CAPS: 1.0000 | ORAL_CAPSULE | Freq: Every day | ORAL | Status: DC

## 2017-03-24 MED ORDER — AMOXICILLIN-POT CLAVULANATE 875-125 MG PO TABS: 1.0000 | ORAL_TABLET | Freq: Two times a day (BID) | ORAL | 0 refills | Status: AC

## 2017-03-24 MED FILL — AMOX-CLAV 875-125 MG TABLET: 875-125 | 10 days supply | Qty: 20 | Fill #0

## 2017-03-24 NOTE — Progress Notes (Addendum)
Subjective:    Patient ID: Brett [name], male    DOB: 1956-01-30, 61 y.o.   MRN: [id number]  HPI  Patient presents today for complete physical.  Immunizations: tdap 6/16 Diet: diet is healthy Wt Readings from Last 3 Encounters:  03/24/17 169 lb 3.2 oz (76.7 kg)  03/04/16 165 lb 3.2 oz (74.9 kg)  10/23/15 164 lb 12.8 oz (74.8 kg)  Exercise:  Walks a lot at work. Walks his complex on the weekends Colonoscopy: 2016, due 2021 [facility]: reports last exam was 6 weeks ago. Dental:  Up to date  Reports that he has had nasal congestion/cough. Present x 6 weeks. Tried claritin, mucinex without improvement.  Head feels clogged. Having some tinnitus, having post nasal drip and coughing fits. Denies fever.  Energy has been low at times.   Has cut down to 10-15 cigarettes/day Review of Systems  Constitutional: Negative for unexpected weight change.  HENT: Positive for rhinorrhea.        Some issues with hearing due to ear fullness  Eyes: Negative for visual disturbance.  Respiratory: Positive for cough.   Cardiovascular: Negative for leg swelling.  Gastrointestinal: Negative for blood in stool, constipation and diarrhea.  Genitourinary: Negative for dysuria, frequency and hematuria.  Musculoskeletal: Positive for back pain.  Skin: Negative for rash.  Neurological: Negative for headaches.  Hematological: Negative for adenopathy.  Psychiatric/Behavioral:       Denies depression/anxiety   Past Medical History:  Diagnosis Date  . Allergy   . Arthritis   . Blood transfusion without reported diagnosis   . Chicken pox   . Glaucoma      Social History   Social History  . Marital status: Widowed    Spouse name: N/A  . Number of children: N/A  . Years of education: N/A   Occupational History  . Not on file.   Social History Main Topics  . Smoking status: Current Every Day Smoker    Packs/day: 0.50    Types: Cigarettes  . Smokeless tobacco: Never Used     Comment: 0.5 ppd    . Alcohol use 8.4 - 9.6 oz/week    14 - 16 Cans of beer per week  . Drug use: No  . Sexual activity: Not on file   Other Topics Concern  . 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    Past Surgical History:  Procedure Laterality Date  . COLONOSCOPY    . HAMMER TOE SURGERY  07/28/2011   Procedure: HAMMER TOE CORRECTION;  Surgeon: Ninetta Lights, MD;  Location: [facility];  Service: Orthopedics;  Laterality: Right;  right 2nd and 4th toes correction hammer toe, capsulotomy metatarsal-phalangeal joints  . [place] 2010   rt total hip-8/10-multiple rt hip surgeries- had 4 prior to 1986 as a child  . THUMB ARTHROSCOPY  2008   rt    Family History  Problem Relation Age of Onset  . Cancer Mother 104       history of colon cancer  . Rosacea Mother   . Colon cancer Mother   . Rosacea Father   . Cancer Cousin        colon  . Colon cancer Cousin   .  Colon cancer Maternal Grandmother   . Esophageal cancer Neg Hx   . Rectal cancer Neg Hx   . Stomach cancer Neg Hx     No Known Allergies  Current Outpatient Prescriptions on File Prior to Visit  Medication Sig Dispense Refill  . Multiple Vitamin (MULTIVITAMIN) tablet Take 1 tablet by mouth daily.    . TRAVATAN Z 0.004 % SOLN ophthalmic solution 1 drop each eye daily  1   No current facility-administered medications on file prior to visit.     BP 123/69 (BP Location: Left Arm, Cuff Size: Normal)   Pulse 70   Temp 98.1 F (36.7 C) (Oral)   Resp 18   Ht 6\' 1"  (1.854 m)   Wt 169 lb 3.2 oz (76.7 kg)   SpO2 100%   BMI 22.32 kg/m       Objective:   Physical Exam  Physical Exam  Constitutional: He is oriented to person, place, and time. He appears  well-developed and well-nourished. No distress.  HENT:  Head: Normocephalic and atraumatic.  Right Ear: Tympanic membrane and ear canal normal.  Left Ear: Tympanic membrane and ear canal normal.  Mouth/Throat: Oropharynx is clear and moist.  Nose: + maxillary sinus tenderness bilaterally Eyes: Pupils are equal, round, and reactive to light. No scleral icterus.  Neck: Normal range of motion. No thyromegaly present.  Cardiovascular: Normal rate and regular rhythm.   No murmur heard. Pulmonary/Chest: Effort normal and breath sounds normal. No respiratory distress. He has no wheezes. He has no rales. He exhibits no tenderness.  Abdominal: Soft. Bowel sounds are normal. He exhibits no distension and no mass. There is no tenderness. There is no rebound and no guarding.  Musculoskeletal: He exhibits no edema.  Lymphadenopathy:    He has no cervical adenopathy.  Neurological: He is alert and oriented to person, place, and time. He exhibits normal muscle tone. Coordination normal.  Skin: Skin is warm and dry.  Psychiatric: He has a normal mood and affect. His behavior is normal. Judgment and thought content normal.           Assessment & Plan:   Preventative care- weight is healthy.  Obtain routine lab work.  Colo up to date. Candidate for screening ct scan of lungs- referral placed. Also candidate for shingrix but this is out of stock. EKG tracing is personally reviewed.  EKG notes NSR.  No acute changes.    Sinusitis- new- will rx with augmentin. He is advised to call if new/worsening symptoms or if symptoms do not improve.       Assessment & Plan:

## 2017-03-24 NOTE — Patient Instructions (Addendum)
Please complete lab work prior to leaving. Work on quitting smoking. Begin augmentin for sinus infection. Let me know if your symptoms worsen or if they fail to improve.  You will be contacted about your CT scan of your chest.

## 2017-03-24 NOTE — Telephone Encounter (Signed)
Received authorization for Low dose CT chest from [facility]  #[id number]  Exp 05/08/17

## 2017-03-29 NOTE — Telephone Encounter (Signed)
Patient scheduled for 8/23

## 2017-03-30 ENCOUNTER — Ambulatory Visit (HOSPITAL_BASED_OUTPATIENT_CLINIC_OR_DEPARTMENT_OTHER)
Admission: RE | Admit: 2017-03-30 | Discharge: 2017-03-30 | Disposition: A | Discharge status: Home / Self Care | Payer: 59 | Source: Ambulatory Visit | Attending: Family | Admitting: Family

## 2017-03-30 DIAGNOSIS — J439 Emphysema, unspecified: Secondary | ICD-10-CM | POA: Insufficient documentation

## 2017-03-30 DIAGNOSIS — I2510 Atherosclerotic heart disease of native coronary artery without angina pectoris: Secondary | ICD-10-CM | POA: Insufficient documentation

## 2017-03-30 DIAGNOSIS — I700 Atherosclerosis of aorta: Secondary | ICD-10-CM | POA: Insufficient documentation

## 2017-03-30 DIAGNOSIS — R05 Cough: Secondary | ICD-10-CM | POA: Insufficient documentation

## 2017-03-30 DIAGNOSIS — Z720 Tobacco use: Secondary | ICD-10-CM

## 2017-05-31 DIAGNOSIS — H401133 Primary open-angle glaucoma, bilateral, severe stage: Secondary | ICD-10-CM | POA: Diagnosis not present

## 2017-09-12 IMAGING — MR MR CERVICAL SPINE W/O CM
4 of 5 series · 30 of 48 positions shown · non-contrast
Comparison: Cervical radiographs 01/29/2015

CLINICAL DATA: Cervical radiculopathy.  Chronic neck pain

EXAM:
MRI CERVICAL SPINE WITHOUT CONTRAST
TECHNIQUE: Multiplanar, multisequence MR imaging of the cervical spine was
performed. No intravenous contrast was administered.

[Series 2: (id) tse sag · sagittal · 3.0mm · 0.41mm/px · 7 of 13 slices shown]
[im 1/13]
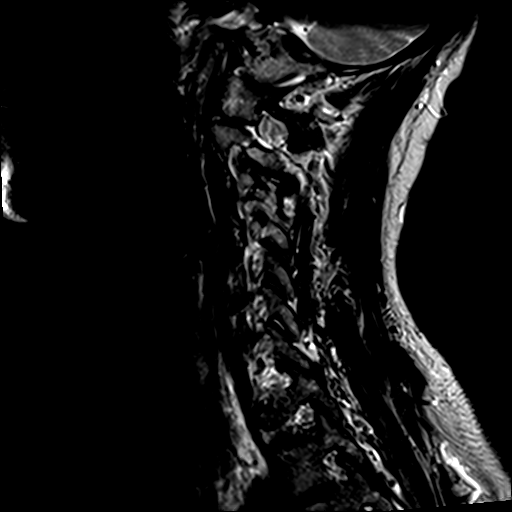
[im 3/13]
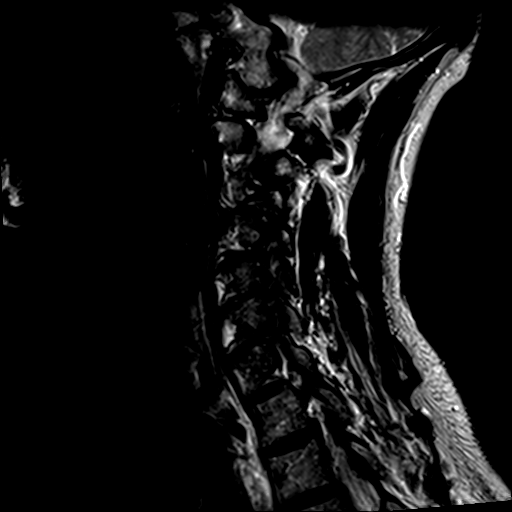
[im 5/13]
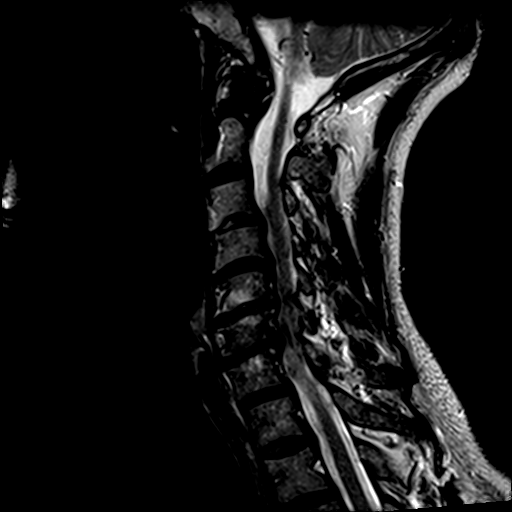
[im 7/13]
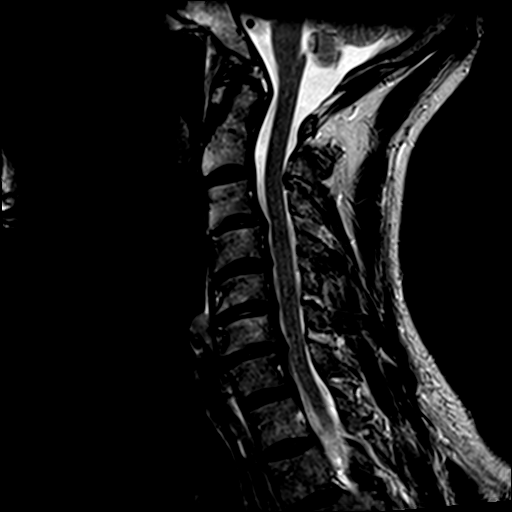
[im 9/13]
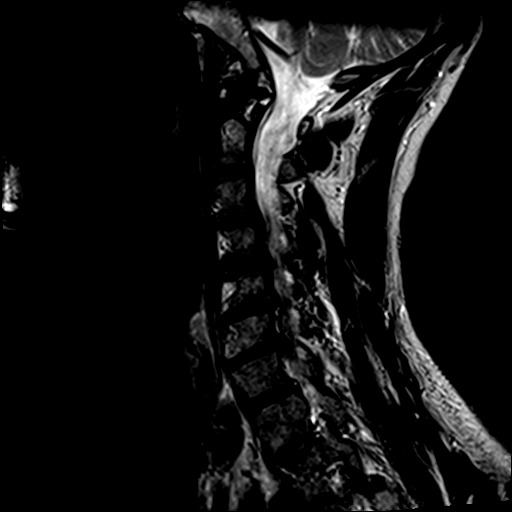
[im 11/13]
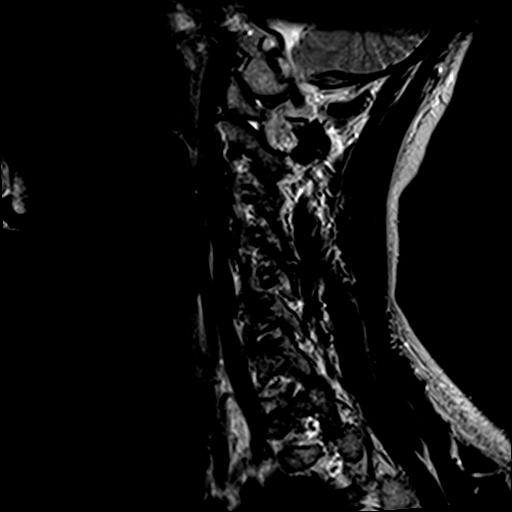
[im 13/13]
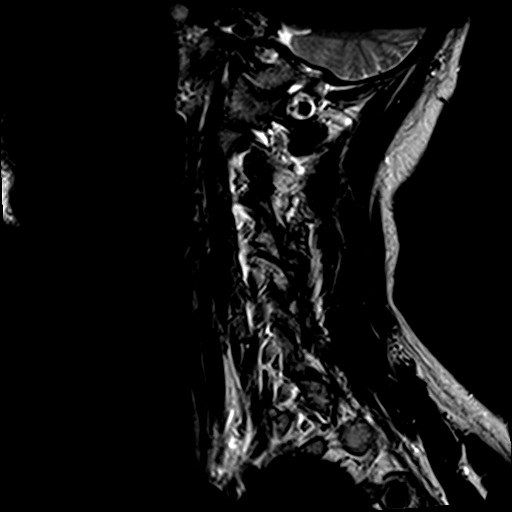

[Series 4: STIR · sagittal · 3.0mm · 0.82mm/px · 6 of 13 slices shown]
[im 1/13]
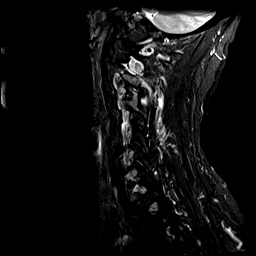
[im 3/13]
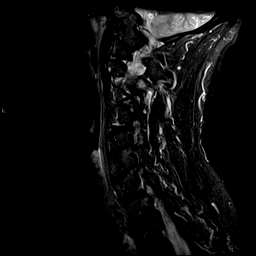
[im 5/13]
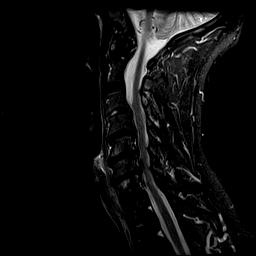
[im 8/13]
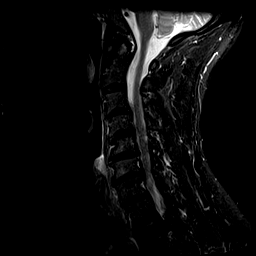
[im 10/13]
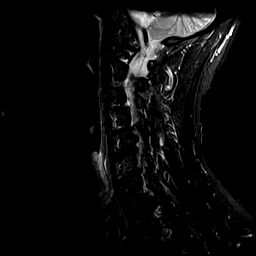
[im 13/13]
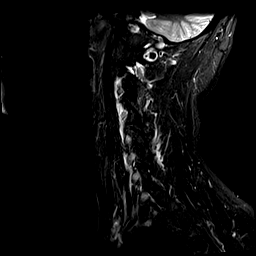

[Series 5: T2 · axial · 3.0mm · 0.39mm/px · z∈[-26,+82]mm · 9 of 32 slices shown (1 of 2)]
[im 1/32]
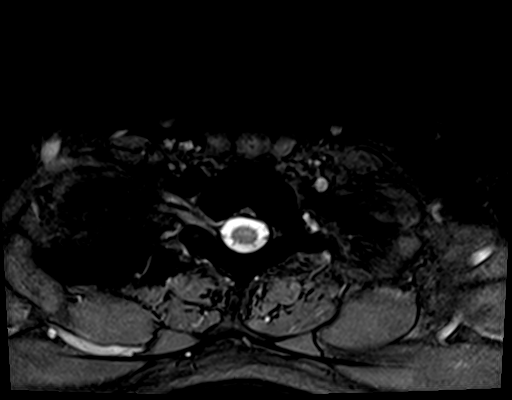
[im 5/32]
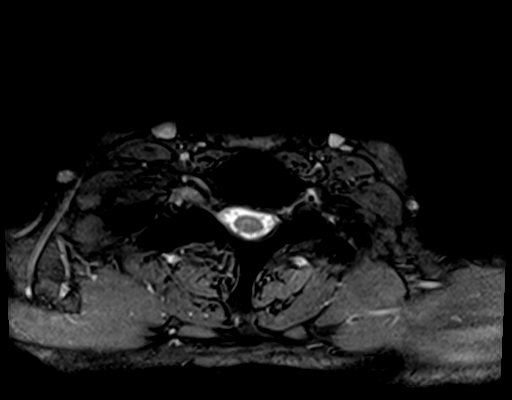
[im 9/32]
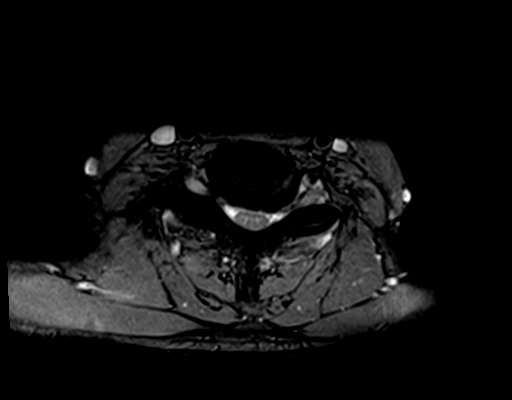
[im 14/32]
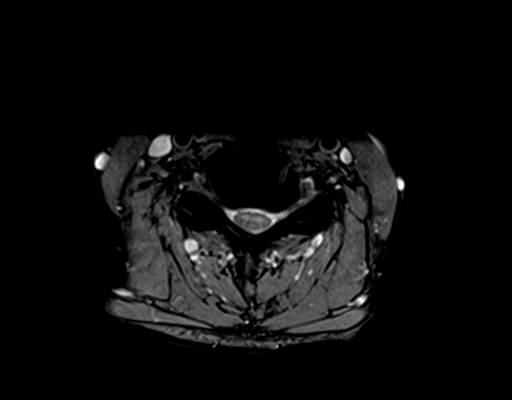
[im 16/32]
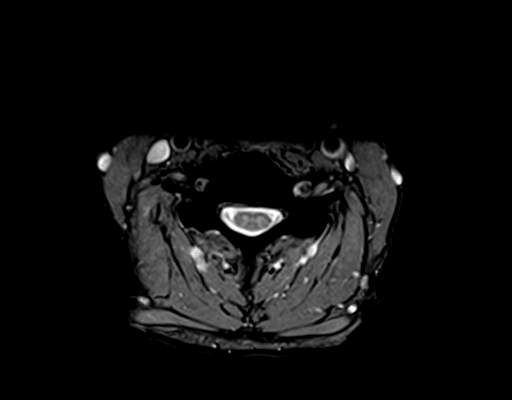
[im 18/32]
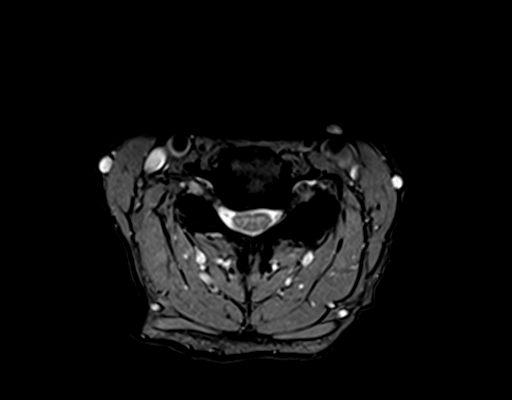
[im 23/32]
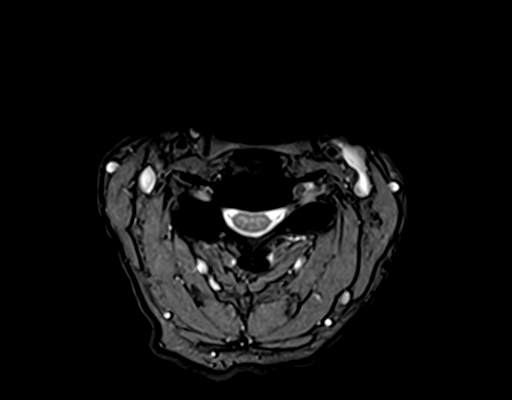
[im 27/32]
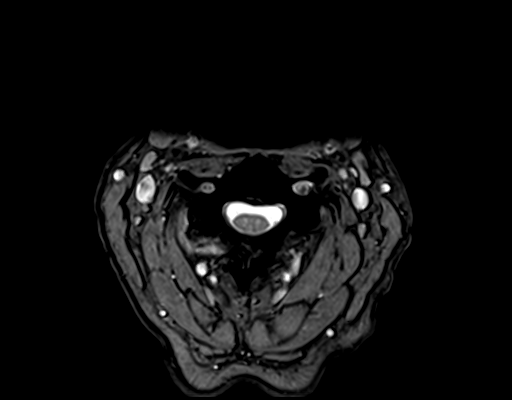
[im 32/32]
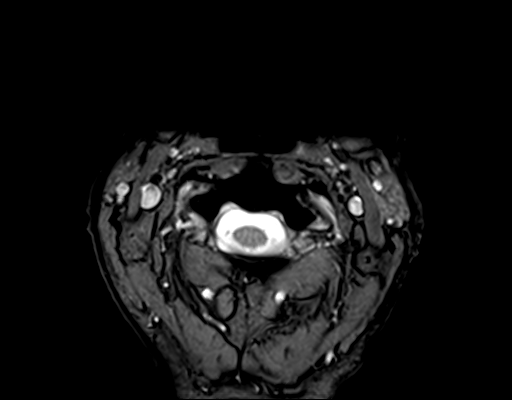

[Series 6: T2 · axial · 3.0mm · 0.62mm/px · z∈[-29,+80]mm · 8 of 30 slices shown (2 of 2)]
[im 1/30]
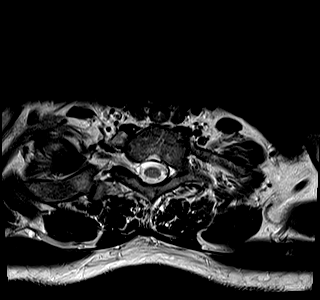
[im 5/30]
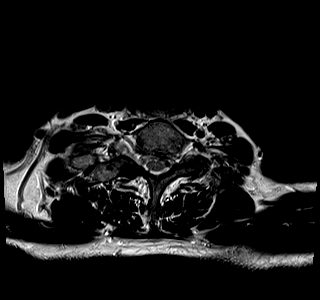
[im 9/30]
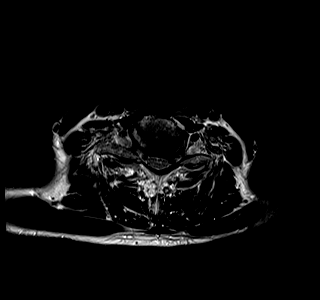
[im 14/30]
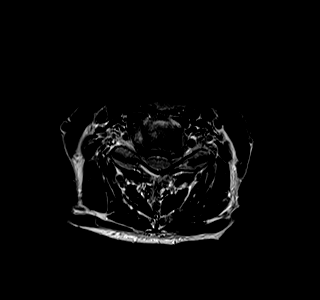
[im 16/30]
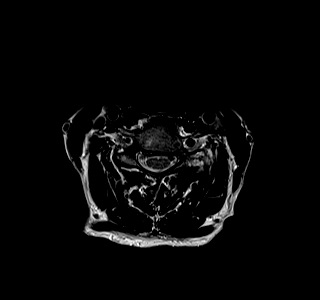
[im 21/30]
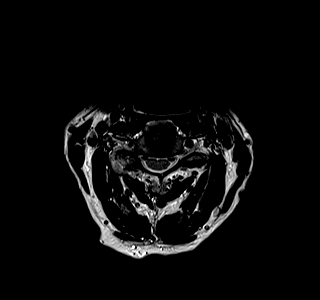
[im 25/30]
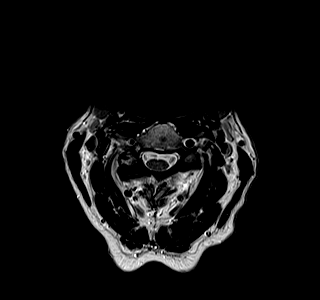
[im 30/30]
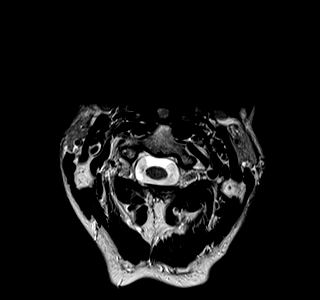

[30 of 48 positions shown; findings below may reference images not displayed]

FINDINGS: Mild anterior slip C3-4. Remaining alignment is anatomic. Negative
for fracture or mass lesion. Negative for metastatic disease. No
cord compression. Spinal cord signal normal.

C2-3:  Negative

C3-4: 3 mm anterior slip with moderate facet degeneration. Disc
degeneration and spurring with mild to moderate foraminal narrowing
bilaterally and mild spinal stenosis

C4-5: Disc degeneration with diffuse uncinate spurring. Moderate
facet hypertrophy. Mild spinal stenosis with moderate foraminal
stenosis bilaterally.

C5-6: Moderate disc degeneration and spondylosis. Diffuse uncinate
spurring and facet degeneration. Mild spinal stenosis and moderate
foraminal encroachment bilaterally

C6-7: Moderate disc degeneration and spondylosis. Diffuse uncinate
spurring. Mild spinal stenosis and moderate foraminal stenosis
bilaterally.

C7-T1: Mild anterior slip without significant spinal or foraminal
stenosis.
IMPRESSION: Moderate cervical spondylosis. Multilevel disc and facet
degeneration causing spinal and foraminal encroachment as described
above

No acute disc protrusion or fracture.

## 2017-09-20 DIAGNOSIS — H401133 Primary open-angle glaucoma, bilateral, severe stage: Secondary | ICD-10-CM | POA: Diagnosis not present

## 2017-09-22 ENCOUNTER — Ambulatory Visit: Payer: 59 | Admitting: Family

## 2017-09-22 ENCOUNTER — Encounter: Payer: Self-pay | Admitting: Family

## 2017-09-22 VITALS — BP 134/69 | HR 74 | Temp 98.3°F | Resp 18 | Wt 165.4 lb

## 2017-09-22 DIAGNOSIS — D7589 Other specified diseases of blood and blood-forming organs: Secondary | ICD-10-CM | POA: Diagnosis not present

## 2017-09-22 DIAGNOSIS — R197 Diarrhea, unspecified: Secondary | ICD-10-CM

## 2017-09-22 LAB — COMPREHENSIVE METABOLIC PANEL
ALT: 19 U/L (ref 0–53)
AST: 23 U/L (ref 0–37)
Albumin: 4.2 g/dL (ref 3.5–5.2)
Alkaline Phosphatase: 48 U/L (ref 39–117)
BUN: 12 mg/dL (ref 6–23)
CHLORIDE: 103 meq/L (ref 96–112)
CO2: 28 meq/L (ref 19–32)
Calcium: 9 mg/dL (ref 8.4–10.5)
Creatinine, Ser: 0.85 mg/dL (ref 0.40–1.50)
GFR: 96.68 mL/min (ref >60.00)
GLUCOSE: 94 mg/dL (ref 70–99)
POTASSIUM: 3.8 meq/L (ref 3.5–5.1)
Sodium: 139 mEq/L (ref 135–145)
Total Bilirubin: 0.6 mg/dL (ref 0.2–1.2)
Total Protein: 6.9 g/dL (ref 6.0–8.3)

## 2017-09-22 LAB — CBC WITH DIFFERENTIAL/PLATELET
BASOS ABS: 0.1 10*3/uL (ref 0.0–0.1)
Basophils Relative: 1 % (ref 0.0–3.0)
Eosinophils Absolute: 0.3 10*3/uL (ref 0.0–0.7)
Eosinophils Relative: 3.5 % (ref 0.0–5.0)
HCT: 45.5 % (ref 39.0–52.0)
HEMOGLOBIN: 15.6 g/dL (ref 13.0–17.0)
LYMPHS ABS: 2.6 10*3/uL (ref 0.7–4.0)
Lymphocytes Relative: 28.6 % (ref 12.0–46.0)
MCHC: 34.3 g/dL (ref 30.0–36.0)
MCV: 99.2 fl (ref 78.0–100.0)
MONO ABS: 0.9 10*3/uL (ref 0.1–1.0)
MONOS PCT: 9.8 % (ref 3.0–12.0)
NEUTROS PCT: 57.1 % (ref 43.0–77.0)
Neutro Abs: 5.3 10*3/uL (ref 1.4–7.7)
Platelets: 186 10*3/uL (ref 150.0–400.0)
RBC: 4.59 Mil/uL (ref 4.22–5.81)
RDW: 12.5 % (ref 11.5–15.5)
WBC: 9.2 10*3/uL (ref 4.0–10.5)

## 2017-09-22 LAB — B12 AND FOLATE PANEL
Folate: 23.3 ng/mL (ref >5.9)
Vitamin B-12: 690 pg/mL (ref 211–911)

## 2017-09-22 NOTE — Patient Instructions (Signed)
Please go to the lab prior to leaving. You may use immodium (over the counter diarrhea med) as needed. Call if new/worsening symptoms or if not improved in 1 week.

## 2017-09-22 NOTE — Progress Notes (Signed)
 Subjective:    Patient ID: Brett [name], male    DOB: 07/09/1956, 62 y.o.   MRN: [id number]  HPI  Patient is a 62-year-old male who presents today with chief complaint of diarrhea.  Reports symptoms have been present for approximately 2.5 weeks.  Reports soft to liquid stools occurs about 10 times each morning. Has some bloating/gas.  Denies dietary changed in diet. Denies nausea/vomitting or fever.  Denies black/bloody stools.  Stopped drinking beer for 3 days. No change in bowel habits. Reports no recent antibiotic use.  Denies recent travel.    Last visit his MCV was noted to be elevated at 100.3.  We advised patient to begin a B complex.  He notes that most nights he will drink 2 alcoholic beverages.  On occasion he will drink 4.   Review of Systems See HPI  Past Medical History:  Diagnosis Date  . Allergy   . Arthritis   . Blood transfusion without reported diagnosis   . Chicken pox   . Glaucoma      Social History   Socioeconomic History  . Marital status: Widowed    Spouse name: Not on file  . Number of children: Not on file  . Years of education: Not on file  . Highest education level: Not on file  Social Needs  . Financial resource strain: Not on file  . Food insecurity - worry: Not on file  . Food insecurity - inability: Not on file  . Transportation needs - medical: Not on file  . Transportation needs - non-medical: Not on file  Occupational History  . Not on file  Tobacco Use  . Smoking status: Current Every Day Smoker    Packs/day: 0.50    Types: Cigarettes  . Smokeless tobacco: Never Used  . Tobacco comment: 0.5 ppd  Substance and Sexual Activity  . Alcohol use: Yes    Alcohol/week: 8.4 - 9.6 oz    Types: 14 - 16 Cans of beer per week  . Drug use: No  . Sexual activity: Not on file  Other Topics Concern  . 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    Past Surgical History:  Procedure Laterality Date  . COLONOSCOPY    . HAMMER TOE SURGERY  07/28/2011   Procedure: HAMMER TOE CORRECTION;  Surgeon: Daniel F Murphy, MD;  Location: [facility];  Service: Orthopedics;  Laterality: Right;  right 2nd and 4th toes correction hammer toe, capsulotomy metatarsal-phalangeal joints  . HIP SURGERY  1986 & 2010   rt total hip-8/10-multiple rt hip surgeries- had 4 prior to 1986 as a child  . THUMB ARTHROSCOPY  2008   rt    Family History  Problem Relation Age of Onset  . Cancer Mother 70       history of colon cancer  . Rosacea Mother   . Colon cancer Mother   . Rosacea Father   . Lung disease Father        ?pulmonary fibrosis  . Cancer Cousin        colon  . Colon cancer Cousin   . Colon cancer Maternal Grandmother   . Alcohol abuse Brother   . Depression Brother   .   Thyroid disease Son        ?hyperthyroid  . Esophageal cancer Neg Hx   . Rectal cancer Neg Hx   . Stomach cancer Neg Hx     No Known Allergies  Current Outpatient Medications on File Prior to Visit  Medication Sig Dispense Refill  . acetaminophen (TYLENOL) 500 MG tablet Take 500-1,500 mg by mouth daily as needed.    . Ascorbic Acid (VITAMIN C) 500 MG CAPS Take 1 capsule by mouth daily.    [name] b complex vitamins capsule Take 1 capsule by mouth daily.    . Multiple Vitamin (MULTIVITAMIN) tablet Take 1 tablet by mouth daily.    . Omega-3 Fatty Acids (OMEGA-3 FISH OIL) 500 MG CAPS Take 1 capsule by mouth daily.    . TRAVATAN Z 0.004 % SOLN ophthalmic solution 1 drop each eye daily  1  . Turmeric 500 MG TABS Take 1 tablet by mouth daily.     No current facility-administered medications on file prior to visit.     BP 134/69 (BP Location: Left Arm, Cuff Size: Normal)   Pulse 74   Temp 98.3 F (36.8  C) (Oral)   Resp 18   Wt 165 lb 6.4 oz (75 kg)   SpO2 97%   BMI 21.82 kg/m       Objective:   Physical Exam  Constitutional: He is oriented to person, place, and time. He appears well-developed and well-nourished. No distress.  HENT:  Head: Normocephalic and atraumatic.  Cardiovascular: Normal rate and regular rhythm.  No murmur heard. Pulmonary/Chest: Effort normal and breath sounds normal. No respiratory distress. He has no wheezes. He has no rales.  Abdominal: Soft. Bowel sounds are normal. He exhibits no distension. There is no tenderness. There is no rebound.  Musculoskeletal: He exhibits no edema.  Neurological: He is alert and oriented to person, place, and time.  Skin: Skin is warm and dry.  Psychiatric: He has a normal mood and affect. His behavior is normal. Thought content normal.          Assessment & Plan:  Diarrhea-etiology unclear.  Could be viral however I would expect it to be improving quite soon if that is the case.  We will check stool studies as well as CBC and see met today.  I have advised the patient to send me a my chart message in 1 week to let me know how he is feeling.  If his symptoms are unchanged and above workup is unrevealing will need to be referred back to GI.  Macrocytosis-we will check B12 and folate today.  Continue B complex vitamin.  Could be related to alcohol use.

## 2017-09-25 ENCOUNTER — Other Ambulatory Visit: Payer: 59

## 2017-09-25 DIAGNOSIS — R197 Diarrhea, unspecified: Secondary | ICD-10-CM | POA: Diagnosis not present

## 2017-09-25 NOTE — Addendum Note (Signed)
Addended by: Caffie Pinto on: 09/25/2017 02:52 PM   Modules accepted: Orders

## 2017-09-26 LAB — C. DIFFICILE GDH AND TOXIN A/B
GDH ANTIGEN: NOT DETECTED
MICRO NUMBER: 90213614
SPECIMEN QUALITY: ADEQUATE
TOXIN A AND B: NOT DETECTED

## 2017-09-27 LAB — OVA AND PARASITE EXAMINATION
CONCENTRATE RESULT:: NONE SEEN
MICRO NUMBER:: 90213606
SPECIMEN QUALITY:: ADEQUATE
TRICHROME RESULT:: NONE SEEN

## 2017-09-29 LAB — STOOL CULTURE
MICRO NUMBER: 90214046
MICRO NUMBER: 90214047
MICRO NUMBER: 90214048
SHIGA RESULT:: NOT DETECTED
SPECIMEN QUALITY: ADEQUATE
SPECIMEN QUALITY:: ADEQUATE
SPECIMEN QUALITY:: ADEQUATE

## 2018-01-24 DIAGNOSIS — H401133 Primary open-angle glaucoma, bilateral, severe stage: Secondary | ICD-10-CM | POA: Diagnosis not present

## 2018-03-30 ENCOUNTER — Encounter: Payer: 59 | Admitting: Family

## 2018-04-13 ENCOUNTER — Encounter: Payer: Self-pay | Admitting: Family

## 2018-04-13 ENCOUNTER — Ambulatory Visit (INDEPENDENT_AMBULATORY_CARE_PROVIDER_SITE_OTHER): Payer: 59 | Admitting: Family

## 2018-04-13 VITALS — BP 124/63 | HR 80 | Temp 98.4°F | Resp 16 | Ht 73.0 in | Wt 163.4 lb

## 2018-04-13 DIAGNOSIS — Z23 Encounter for immunization: Secondary | ICD-10-CM

## 2018-04-13 DIAGNOSIS — Z0000 Encounter for general adult medical examination without abnormal findings: Secondary | ICD-10-CM

## 2018-04-13 DIAGNOSIS — Z125 Encounter for screening for malignant neoplasm of prostate: Secondary | ICD-10-CM

## 2018-04-13 DIAGNOSIS — Z720 Tobacco use: Secondary | ICD-10-CM

## 2018-04-13 DIAGNOSIS — M25551 Pain in right hip: Secondary | ICD-10-CM

## 2018-04-13 DIAGNOSIS — J309 Allergic rhinitis, unspecified: Secondary | ICD-10-CM | POA: Diagnosis not present

## 2018-04-13 LAB — BASIC METABOLIC PANEL
BUN: 11 mg/dL (ref 6–23)
CALCIUM: 9.6 mg/dL (ref 8.4–10.5)
CO2: 29 meq/L (ref 19–32)
CREATININE: 0.85 mg/dL (ref 0.40–1.50)
Chloride: 100 mEq/L (ref 96–112)
GFR: 96.51 mL/min (ref >60.00)
Glucose, Bld: 89 mg/dL (ref 70–99)
Potassium: 4.6 mEq/L (ref 3.5–5.1)
Sodium: 138 mEq/L (ref 135–145)

## 2018-04-13 LAB — URINALYSIS, ROUTINE W REFLEX MICROSCOPIC
BILIRUBIN URINE: NEGATIVE
HGB URINE DIPSTICK: NEGATIVE
LEUKOCYTES UA: NEGATIVE
Nitrite: NEGATIVE
RBC / HPF: NONE SEEN (ref 0–?)
Specific Gravity, Urine: 1.01 (ref 1.000–1.030)
TOTAL PROTEIN, URINE-UPE24: NEGATIVE
Urine Glucose: NEGATIVE
Urobilinogen, UA: 0.2 (ref 0.0–1.0)
WBC, UA: NONE SEEN (ref 0–?)
pH: 5.5 (ref 5.0–8.0)

## 2018-04-13 LAB — CBC WITH DIFFERENTIAL/PLATELET
BASOS PCT: 1 % (ref 0.0–3.0)
Basophils Absolute: 0.1 10*3/uL (ref 0.0–0.1)
EOS PCT: 2.2 % (ref 0.0–5.0)
Eosinophils Absolute: 0.2 10*3/uL (ref 0.0–0.7)
HCT: 48.3 % (ref 39.0–52.0)
Hemoglobin: 16.4 g/dL (ref 13.0–17.0)
LYMPHS ABS: 2 10*3/uL (ref 0.7–4.0)
Lymphocytes Relative: 22.3 % (ref 12.0–46.0)
MCHC: 33.9 g/dL (ref 30.0–36.0)
MCV: 99.9 fl (ref 78.0–100.0)
MONOS PCT: 10 % (ref 3.0–12.0)
Monocytes Absolute: 0.9 10*3/uL (ref 0.1–1.0)
NEUTROS PCT: 64.5 % (ref 43.0–77.0)
Neutro Abs: 5.9 10*3/uL (ref 1.4–7.7)
Platelets: 185 10*3/uL (ref 150.0–400.0)
RBC: 4.83 Mil/uL (ref 4.22–5.81)
RDW: 13 % (ref 11.5–15.5)
WBC: 9.2 10*3/uL (ref 4.0–10.5)

## 2018-04-13 LAB — HEPATIC FUNCTION PANEL
ALT: 20 U/L (ref 0–53)
AST: 25 U/L (ref 0–37)
Albumin: 4.5 g/dL (ref 3.5–5.2)
Alkaline Phosphatase: 57 U/L (ref 39–117)
BILIRUBIN DIRECT: 0.1 mg/dL (ref 0.0–0.3)
TOTAL PROTEIN: 7.1 g/dL (ref 6.0–8.3)
Total Bilirubin: 0.6 mg/dL (ref 0.2–1.2)

## 2018-04-13 LAB — TSH: TSH: 0.83 u[IU]/mL (ref 0.35–4.50)

## 2018-04-13 LAB — LIPID PANEL
Cholesterol: 178 mg/dL (ref 0–200)
HDL: 91.4 mg/dL (ref >39.00)
LDL Cholesterol: 77 mg/dL (ref 0–99)
NonHDL: 87.05
TRIGLYCERIDES: 51 mg/dL (ref 0.0–149.0)
Total CHOL/HDL Ratio: 2
VLDL: 10.2 mg/dL (ref 0.0–40.0)

## 2018-04-13 LAB — PSA: PSA: 1.72 ng/mL (ref 0.10–4.00)

## 2018-04-13 MED ORDER — SILDENAFIL CITRATE 20 MG PO TABS: ORAL_TABLET | ORAL | 1 refills | Status: DC

## 2018-04-13 NOTE — Progress Notes (Addendum)
Subjective:    Patient ID: Brett [name], male    DOB: Jan 02, 1956, 62 y.o.   MRN: [id number]  HPI  Patient is a 62 year old male who presents today for routine physical.  Immunizations: tdap 2016, would like shingrix and flu shot today Diet: healthy Exercise:  Limited due to hip pain, does walk at work.  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  He underwent his first hip replacement at age 83 as well as a later revision.  He has chronic right hip and pelvic pain.  This pain interferes with his ability to sleep sometimes.  Review of Systems  Constitutional: Negative for fever and unexpected weight change.  HENT: Positive for rhinorrhea. Negative for sinus pain.   Respiratory: Negative for shortness of breath.   Cardiovascular: Negative for chest pain and palpitations.  Genitourinary:       C/o ED symptoms.   Musculoskeletal: Positive for arthralgias and back pain.   Past Medical History:  Diagnosis Date  . Allergy   . Arthritis   . Blood transfusion without reported diagnosis   . Chicken pox   . Glaucoma      Social History   Socioeconomic History  . Marital status: Widowed    Spouse name: Not on file  . Number of children: Not on file  . Years of education: Not on file  . Highest education level: Not on file  Occupational History  . Not on file  Social Needs  . Financial resource strain: Not on file  . Food insecurity:    Worry: Not on file    Inability: Not on file  . Transportation needs:    Medical: Not on file    Non-medical: Not on file  Tobacco Use  . Smoking status: Current Every Day Smoker    Packs/day: 0.50    Types: Cigarettes  . Smokeless tobacco: Never Used  . Tobacco comment: 0.5 ppd  Substance and Sexual  Activity  . Alcohol use: Yes    Alcohol/week: 14.0 - 16.0 standard drinks    Types: 14 - 16 Cans of beer per week  . Drug use: No  . Sexual activity: Not on file  Lifestyle  . Physical activity:    Days per week: Not on file    Minutes per session: Not on file  . Stress: Not on file  Relationships  . Social connections:    Talks on phone: Not on file    Gets together: Not on file    Attends religious service: Not on file    Active member of club or organization: Not on file    Attends meetings of clubs or organizations: Not on file    Relationship status: Not on file  . Intimate partner violence:    Fear of current or ex partner: Not on file    Emotionally abused: Not on file    Physically abused: Not on file    Forced sexual activity: Not on file  Other Topics Concern  . Not on file  Social History Narrative   Quality control Tech- gauges/callibrations.     Some colleg/tech school   wife passed   3 grown children (oldest daughter is living with them) youngest  daughter lives in [place].  Curator at [employer].  Son lives near [place]- framing/art.    Past Surgical History:  Procedure Laterality Date  . COLONOSCOPY    . HAMMER TOE SURGERY  07/28/2011   Procedure: HAMMER TOE CORRECTION;  Surgeon: Ninetta Lights, MD;  Location: [facility];  Service: Orthopedics;  Laterality: Right;  right 2nd and 4th toes correction hammer toe, capsulotomy metatarsal-phalangeal joints  . [place] 2010   rt total hip-8/10-multiple rt hip surgeries- had 4 prior to 1986 as a child  . THUMB ARTHROSCOPY  2008   rt    Family History  Problem Relation Age of Onset  . Cancer Mother 3       history of colon cancer  . Rosacea Mother   . Colon cancer Mother   . Rosacea Father   . Lung disease Father        ?pulmonary fibrosis  . Cancer Cousin        colon  . Colon cancer Cousin   . Colon cancer Maternal Grandmother   . Alcohol abuse Brother   .  Depression Brother   . Thyroid disease Son        ?hyperthyroid  . 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 Esophageal cancer Neg Hx   . Rectal cancer Neg Hx   . Stomach cancer Neg Hx     No Known Allergies  Current Outpatient Medications on File Prior to Visit  Medication Sig Dispense Refill  . acetaminophen (TYLENOL) 500 MG tablet Take 500-1,500 mg by mouth daily as needed.    . Ascorbic Acid (VITAMIN C) 500 MG CAPS Take 1 capsule by mouth daily.    [name] b complex vitamins capsule Take 1 capsule by mouth daily.    . Multiple Vitamin (MULTIVITAMIN) tablet Take 1 tablet by mouth daily.    . Omega-3 Fatty Acids (OMEGA-3 FISH OIL) 500 MG CAPS Take 1 capsule by mouth daily.    . TRAVATAN Z 0.004 % SOLN ophthalmic solution 1 drop each eye daily  1   No current facility-administered medications on file prior to visit.     BP 124/63 (BP Location: Left Arm, Cuff Size: Normal)   Pulse 80   Temp 98.4 F (36.9 C) (Oral)   Resp 16   Ht 6\' 1"  (1.854 m)   Wt 163 lb 6.4 oz (74.1 kg)   SpO2 100%   BMI 21.56 kg/m       Objective:   Physical Exam  Constitutional: He is oriented to person, place, and time. He appears well-developed and well-nourished. No distress.  HENT:  Head: Normocephalic and atraumatic.  Right Ear: Tympanic membrane and ear canal normal.  Left Ear: Tympanic membrane and ear canal normal.  Nose: Right sinus exhibits no frontal sinus tenderness. Left sinus exhibits no frontal sinus tenderness.  Mouth/Throat: No oropharyngeal exudate.  Eyes: Pupils are equal, round, and reactive to light. EOM are normal.  Neck: Neck supple.  Cardiovascular: Normal rate and regular rhythm.  No murmur heard. Pulmonary/Chest: Effort normal and breath sounds normal. No respiratory distress. He has no wheezes. He has no rales.  Abdominal: Bowel sounds are normal. He exhibits no distension. There is no tenderness. There is no guarding.  Musculoskeletal: He exhibits  no edema.  Lymphadenopathy:    He has no cervical adenopathy.  Neurological: He is alert and oriented to person, place, and time.  Skin: Skin is warm and dry.  Psychiatric: He has a normal mood and affect. His behavior is normal. Thought content normal.          Assessment & Plan:  Preventative care-discussed healthy diet, and exercise.  Will obtain routine lab work.  Including screening CT due to ongoing tobacco abuse.  We did discuss importance of tobacco cessation.  Colonoscopy is up-to-date.  Check PSA today. EKG tracing is personally reviewed.  EKG notes NSR.  No acute changes. Note is made of short PR. Asymptomatic- monitor. Shingrix  Chronic right hip pain- will refer to orthopedics for further evaluation.   Allergic rhinitis-exam and symptoms most consistent with allergic rhinitis.  Advised trial of Claritin and Flonase.  He is advised to call if symptoms worsen, if he develops fever or symptoms fail to improve in the next 1 week.

## 2018-04-13 NOTE — Addendum Note (Signed)
Addended by: Kelle Darting A on: 04/13/2018 11:43 AM   Modules accepted: Orders

## 2018-04-13 NOTE — Patient Instructions (Addendum)
Begin claritin 10mg  once daily and flonase 2 sprays each nostril once daily. Let me know if your symptoms worsen or if not improved in 1 week.  You should be contacted about scheduling your chest CT. Please work on quitting smoking.

## 2018-04-18 ENCOUNTER — Ambulatory Visit (HOSPITAL_BASED_OUTPATIENT_CLINIC_OR_DEPARTMENT_OTHER): Payer: 59

## 2018-04-25 ENCOUNTER — Ambulatory Visit (HOSPITAL_BASED_OUTPATIENT_CLINIC_OR_DEPARTMENT_OTHER)
Admission: RE | Admit: 2018-04-25 | Discharge: 2018-04-25 | Disposition: A | Discharge status: Home / Self Care | Payer: 59 | Source: Ambulatory Visit | Attending: Family | Admitting: Family

## 2018-04-25 DIAGNOSIS — I700 Atherosclerosis of aorta: Secondary | ICD-10-CM | POA: Diagnosis not present

## 2018-04-25 DIAGNOSIS — J432 Centrilobular emphysema: Secondary | ICD-10-CM | POA: Diagnosis not present

## 2018-04-25 DIAGNOSIS — Z720 Tobacco use: Secondary | ICD-10-CM

## 2018-04-25 DIAGNOSIS — Z122 Encounter for screening for malignant neoplasm of respiratory organs: Secondary | ICD-10-CM | POA: Diagnosis not present

## 2018-04-25 DIAGNOSIS — F17210 Nicotine dependence, cigarettes, uncomplicated: Secondary | ICD-10-CM | POA: Diagnosis not present

## 2018-04-25 DIAGNOSIS — I2510 Atherosclerotic heart disease of native coronary artery without angina pectoris: Secondary | ICD-10-CM | POA: Diagnosis not present

## 2018-04-26 ENCOUNTER — Encounter: Payer: Self-pay | Admitting: Family

## 2018-05-31 DIAGNOSIS — H401133 Primary open-angle glaucoma, bilateral, severe stage: Secondary | ICD-10-CM | POA: Diagnosis not present

## 2018-06-19 ENCOUNTER — Ambulatory Visit (INDEPENDENT_AMBULATORY_CARE_PROVIDER_SITE_OTHER): Payer: 59

## 2018-06-19 DIAGNOSIS — Z23 Encounter for immunization: Secondary | ICD-10-CM

## 2018-09-17 DIAGNOSIS — H401133 Primary open-angle glaucoma, bilateral, severe stage: Secondary | ICD-10-CM | POA: Diagnosis not present

## 2018-11-01 DIAGNOSIS — M25552 Pain in left hip: Secondary | ICD-10-CM

## 2018-11-01 HISTORY — DX: Pain in left hip: M25.552

## 2019-04-19 ENCOUNTER — Encounter: Payer: Self-pay | Admitting: Family

## 2019-04-19 ENCOUNTER — Other Ambulatory Visit: Payer: Self-pay

## 2019-04-19 ENCOUNTER — Ambulatory Visit (INDEPENDENT_AMBULATORY_CARE_PROVIDER_SITE_OTHER): Payer: 59 | Admitting: Family

## 2019-04-19 VITALS — BP 136/70 | HR 78 | Temp 96.9°F | Resp 16 | Ht 73.0 in | Wt 164.0 lb

## 2019-04-19 DIAGNOSIS — Z0000 Encounter for general adult medical examination without abnormal findings: Secondary | ICD-10-CM

## 2019-04-19 DIAGNOSIS — L989 Disorder of the skin and subcutaneous tissue, unspecified: Secondary | ICD-10-CM | POA: Diagnosis not present

## 2019-04-19 DIAGNOSIS — Z0001 Encounter for general adult medical examination with abnormal findings: Secondary | ICD-10-CM

## 2019-04-19 DIAGNOSIS — Z125 Encounter for screening for malignant neoplasm of prostate: Secondary | ICD-10-CM | POA: Diagnosis not present

## 2019-04-19 DIAGNOSIS — M5430 Sciatica, unspecified side: Secondary | ICD-10-CM

## 2019-04-19 DIAGNOSIS — Z23 Encounter for immunization: Secondary | ICD-10-CM | POA: Diagnosis not present

## 2019-04-19 DIAGNOSIS — Z720 Tobacco use: Secondary | ICD-10-CM

## 2019-04-19 LAB — HEPATIC FUNCTION PANEL
ALT: 19 U/L (ref 0–53)
AST: 26 U/L (ref 0–37)
Albumin: 4.2 g/dL (ref 3.5–5.2)
Alkaline Phosphatase: 50 U/L (ref 39–117)
Bilirubin, Direct: 0.1 mg/dL (ref 0.0–0.3)
Total Bilirubin: 0.5 mg/dL (ref 0.2–1.2)
Total Protein: 6.6 g/dL (ref 6.0–8.3)

## 2019-04-19 LAB — LIPID PANEL
Cholesterol: 174 mg/dL (ref 0–200)
HDL: 92.3 mg/dL (ref >39.00)
LDL Cholesterol: 72 mg/dL (ref 0–99)
NonHDL: 82.03
Total CHOL/HDL Ratio: 2
Triglycerides: 49 mg/dL (ref 0.0–149.0)
VLDL: 9.8 mg/dL (ref 0.0–40.0)

## 2019-04-19 LAB — CBC WITH DIFFERENTIAL/PLATELET
Basophils Absolute: 0.1 10*3/uL (ref 0.0–0.1)
Basophils Relative: 1.7 % (ref 0.0–3.0)
Eosinophils Absolute: 0.2 10*3/uL (ref 0.0–0.7)
Eosinophils Relative: 2.4 % (ref 0.0–5.0)
HCT: 45.4 % (ref 39.0–52.0)
Hemoglobin: 15.4 g/dL (ref 13.0–17.0)
Lymphocytes Relative: 20.6 % (ref 12.0–46.0)
Lymphs Abs: 1.5 10*3/uL (ref 0.7–4.0)
MCHC: 33.8 g/dL (ref 30.0–36.0)
MCV: 102.1 fl — ABNORMAL HIGH (ref 78.0–100.0)
Monocytes Absolute: 0.7 10*3/uL (ref 0.1–1.0)
Monocytes Relative: 9.3 % (ref 3.0–12.0)
Neutro Abs: 4.8 10*3/uL (ref 1.4–7.7)
Neutrophils Relative %: 66 % (ref 43.0–77.0)
Platelets: 176 10*3/uL (ref 150.0–400.0)
RBC: 4.45 Mil/uL (ref 4.22–5.81)
RDW: 12.3 % (ref 11.5–15.5)
WBC: 7.3 10*3/uL (ref 4.0–10.5)

## 2019-04-19 LAB — BASIC METABOLIC PANEL
BUN: 12 mg/dL (ref 6–23)
CO2: 30 mEq/L (ref 19–32)
Calcium: 9.6 mg/dL (ref 8.4–10.5)
Chloride: 100 mEq/L (ref 96–112)
Creatinine, Ser: 0.8 mg/dL (ref 0.40–1.50)
GFR: 97.07 mL/min (ref >60.00)
Glucose, Bld: 106 mg/dL — ABNORMAL HIGH (ref 70–99)
Potassium: 4.5 mEq/L (ref 3.5–5.1)
Sodium: 138 mEq/L (ref 135–145)

## 2019-04-19 LAB — PSA: PSA: 1.81 ng/mL (ref 0.10–4.00)

## 2019-04-19 LAB — TSH: TSH: 1.05 u[IU]/mL (ref 0.35–4.50)

## 2019-04-19 MED ORDER — LORATADINE 10 MG PO TABS: 10.0000 mg | ORAL_TABLET | Freq: Every day | ORAL | 11 refills | Status: DC

## 2019-04-19 NOTE — Progress Notes (Signed)
Subjective:    Patient ID: Brett [name], male    DOB: August 24, 1955, 63 y.o.   MRN: [id number]  HPI    Patient presents today for complete physical.  Immunizations: up to date Diet:  healthy Exercise: no formal exercise, active at work Colonoscopy: due 11/21 Dental: up to date Vision:  2 months ago, goes every 3 months for glaucoma surveillance Wt Readings from Last 3 Encounters:  04/19/19 164 lb (74.4 kg)  04/13/18 163 lb 6.4 oz (74.1 kg)  09/22/17 165 lb 6.4 oz (75 kg)   Reports he smokes 15 cigarettes a day, continues to wean himself.   Allergic rhinitis- notes sinus drainage. Chronic requests medication.   Reports occasional Left low back pain, radiates down the   Review of Systems  Constitutional: Negative for unexpected weight change.  HENT: Positive for rhinorrhea.   Respiratory: Negative for cough and shortness of breath.   Cardiovascular: Negative for chest pain.  Gastrointestinal: Negative for constipation and diarrhea.  Genitourinary: Negative for difficulty urinating, frequency and hematuria.  Musculoskeletal: Positive for arthralgias (hip pain). Negative for myalgias.  Skin: Negative for rash.  Neurological: Negative for headaches.  Hematological: Negative for adenopathy.  Psychiatric/Behavioral:       Denies depression/anxiety       Past Medical History:  Diagnosis Date  . Allergy   . Arthritis   . Blood transfusion without reported diagnosis   . Chicken pox   . Congenital hip deformity   . Glaucoma      Social History   Socioeconomic History  . Marital status: Widowed    Spouse name: Not on file  . Number of children: Not on file  . Years of education: Not on file  . Highest education level: Not on file  Occupational History  . Not on file  Social Needs  . Financial resource strain: Not on file  . Food insecurity    Worry: Not on file    Inability: Not on file  . Transportation needs    Medical: Not on file    Non-medical: Not  on file  Tobacco Use  . Smoking status: Current Every Day Smoker    Packs/day: 0.50    Types: Cigarettes  . Smokeless tobacco: Never Used  . Tobacco comment: 0.5 ppd  Substance and Sexual Activity  . Alcohol use: Yes    Alcohol/week: 14.0 - 16.0 standard drinks    Types: 14 - 16 Cans of beer per week  . Drug use: No  . Sexual activity: Not on file  Lifestyle  . Physical activity    Days per week: Not on file    Minutes per session: Not on file  . Stress: Not on file  Relationships  . Social Herbalist on phone: Not on file    Gets together: Not on file    Attends religious service: Not on file    Active member of club or organization: Not on file    Attends meetings of clubs or organizations: Not on file    Relationship status: Not on file  . Intimate partner violence    Fear of current or ex partner: Not on file    Emotionally abused: Not on file    Physically abused: Not on file    Forced sexual activity: Not on file  Other Topics Concern  . Not on file  Social History Narrative   Quality control Tech- gauges/callibrations.     Some colleg/tech school  wife passed   3 grown children (oldest daughter is living with them) youngest daughter lives in [place].  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    Past Surgical History:  Procedure Laterality Date  . COLONOSCOPY    . HAMMER TOE SURGERY  07/28/2011   Procedure: HAMMER TOE CORRECTION;  Surgeon: Ninetta Lights, MD;  Location: [facility];  Service: Orthopedics;  Laterality: Right;  right 2nd and 4th toes correction hammer toe, capsulotomy metatarsal-phalangeal joints  . [place] 2010   rt total hip-8/10-multiple rt hip surgeries- had 4 prior to 1986 as a child  . THUMB ARTHROSCOPY  2008   rt    Family History  Problem Relation Age of Onset  . Cancer Mother 60       history of colon cancer  . Rosacea Mother   . Colon cancer Mother   . Rosacea  Father   . Lung disease Father        ?pulmonary fibrosis  . Cancer Cousin        colon  . Colon cancer Cousin   . Colon cancer Maternal Grandmother   . Alcohol abuse Brother   . Depression Brother   . Thyroid disease Son        ?hyperthyroid  . 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 Esophageal cancer Neg Hx   . Rectal cancer Neg Hx   . Stomach cancer Neg Hx     No Known Allergies  Current Outpatient Medications on File Prior to Visit  Medication Sig Dispense Refill  . acetaminophen (TYLENOL) 500 MG tablet Take 500-1,500 mg by mouth daily as needed.    . Ascorbic Acid (VITAMIN C) 500 MG CAPS Take 1 capsule by mouth daily.    [name] b complex vitamins capsule Take 1 capsule by mouth daily.    . celecoxib (CELEBREX) 200 MG capsule Take by mouth daily.    . cyclobenzaprine (FLEXERIL) 10 MG tablet cyclobenzaprine 10 mg tablet  Take 1 tablet 3 times a day by oral route.    . Multiple Vitamin (MULTIVITAMIN) tablet Take 1 tablet by mouth daily.    . Omega-3 Fatty Acids (OMEGA-3 FISH OIL) 500 MG CAPS Take 1 capsule by mouth daily.    . sildenafil (REVATIO) 20 MG tablet 1-2 tabs by mouth as needed prior to sexual activity 50 tablet 1  . TRAVATAN Z 0.004 % SOLN ophthalmic solution 1 drop each eye daily  1   No current facility-administered medications on file prior to visit.     BP 136/70 (BP Location: Right Arm, Patient Position: Sitting, Cuff Size: Normal)   Pulse 78   Temp (!) 96.9 F (36.1 C) (Temporal)   Resp 16   Ht 6\' 1"  (1.854 m)   Wt 164 lb (74.4 kg)   SpO2 99%   BMI 21.64 kg/m    Objective:   Physical Exam  Physical Exam  Constitutional: He is oriented to person, place, and time. He appears well-developed and well-nourished. No distress.  HENT:  Head: Normocephalic and atraumatic.  Right Ear: Tympanic membrane and ear canal normal.  Left Ear: Tympanic membrane and ear canal normal.  Mouth/Throat: Oropharynx is clear and moist.  Eyes:  Pupils are equal, round. No scleral icterus.  Neck: Normal range of motion. No thyromegaly present.  Cardiovascular: Normal rate and regular rhythm.   No murmur heard. Pulmonary/Chest: Effort normal and  breath sounds normal. No respiratory distress. He has no wheezes. He has no rales. He exhibits no tenderness.  Abdominal: Soft. Bowel sounds are normal. He exhibits no distension and no mass. There is no tenderness. There is no rebound and no guarding.  Musculoskeletal: He exhibits no edema.  Lymphadenopathy:    He has no cervical adenopathy.  Neurological: He is alert and oriented to person, place, and time. He has normal patellar reflexes. He exhibits normal muscle tone. Coordination normal.  Skin: Skin is warm and dry. hyperpigmented small raised lesion noted on right cheek Psychiatric: He has a normal mood and affect. His behavior is normal. Judgment and thought content normal.           Assessment & Plan:   Preventative care- discussed importance of smoking cessation. Obtain routine lab work. Flu shot today.  Tetanus up to date. Shrigrix series complete.  Check routine lab work and PSA. Obtain annual lung cancer CT scan.   Skin lesion- refer to dermatology for evaluation/skin check.  Sciatica- he has an rx for celebrex from his orthopedist which he has not yet started. Advised trial of celebrex plus back exercises as outlined on AVS.  Pt advised to call if symptoms worsen or if symptoms fail to improve.       Assessment & Plan:

## 2019-04-19 NOTE — Patient Instructions (Signed)

## 2019-04-26 ENCOUNTER — Ambulatory Visit (HOSPITAL_BASED_OUTPATIENT_CLINIC_OR_DEPARTMENT_OTHER)
Admission: RE | Admit: 2019-04-26 | Discharge: 2019-04-26 | Disposition: A | Discharge status: Home / Self Care | Payer: 59 | Source: Ambulatory Visit | Attending: Family | Admitting: Family

## 2019-04-26 ENCOUNTER — Other Ambulatory Visit: Payer: Self-pay

## 2019-04-26 DIAGNOSIS — Z720 Tobacco use: Secondary | ICD-10-CM | POA: Insufficient documentation

## 2019-04-29 ENCOUNTER — Telehealth: Payer: Self-pay | Admitting: Family

## 2019-04-29 NOTE — Telephone Encounter (Signed)
Opened in error

## 2019-09-03 ENCOUNTER — Ambulatory Visit: Payer: 59

## 2019-09-12 ENCOUNTER — Ambulatory Visit: Payer: 59 | Attending: Internal Medicine

## 2019-09-12 DIAGNOSIS — Z23 Encounter for immunization: Secondary | ICD-10-CM | POA: Insufficient documentation

## 2019-09-12 NOTE — Progress Notes (Signed)
   Covid-19 Vaccination Clinic  Name:  Brett [name]    MRN: [id number] DOB: [birth year]  09/12/2019  [name] was observed post Covid-19 immunization for 15 minutes without incidence. He was provided with Vaccine Information Sheet and instruction to access the V-Safe system.   [name] was instructed to call 911 with any severe reactions post vaccine: [name] Difficulty breathing  . Swelling of your face and throat  . A fast heartbeat  . A bad rash all over your body  . Dizziness and weakness    Immunizations Administered    Name Date Dose VIS Date Route   Pfizer COVID-19 Vaccine 09/12/2019 11:40 AM 0.3 mL 07/19/2019 Intramuscular   Manufacturer: [employer]   Lot: [id number]   [place]: [id number]

## 2019-09-20 ENCOUNTER — Ambulatory Visit: Payer: 59

## 2019-10-07 ENCOUNTER — Ambulatory Visit: Payer: 59 | Attending: Internal Medicine

## 2019-10-07 DIAGNOSIS — Z23 Encounter for immunization: Secondary | ICD-10-CM | POA: Insufficient documentation

## 2019-10-07 NOTE — Progress Notes (Signed)
   Covid-19 Vaccination Clinic  Name:  KYSIR CHIVERS    MRN: [id number] DOB: 09-24-1955  10/07/2019  Mr. Travers was observed post Covid-19 immunization for 15 minutes without incidence. He was provided with Vaccine Information Sheet and instruction to access the V-Safe system.   Mr. Epting was instructed to call 911 with any severe reactions post vaccine: [name] Difficulty breathing  . Swelling of your face and throat  . A fast heartbeat  . A bad rash all over your body  . Dizziness and weakness    Immunizations Administered    Name Date Dose VIS Date Route   Pfizer COVID-19 Vaccine 10/07/2019  3:07 PM 0.3 mL 07/19/2019 Intramuscular   Manufacturer: [place]   Lot: [id number]   [place]: [id number]

## 2019-11-07 DIAGNOSIS — G709 Myoneural disorder, unspecified: Secondary | ICD-10-CM | POA: Insufficient documentation

## 2019-11-07 HISTORY — DX: Myoneural disorder, unspecified: G70.9

## 2020-01-14 DIAGNOSIS — M9904 Segmental and somatic dysfunction of sacral region: Secondary | ICD-10-CM | POA: Diagnosis not present

## 2020-01-14 DIAGNOSIS — M9902 Segmental and somatic dysfunction of thoracic region: Secondary | ICD-10-CM | POA: Diagnosis not present

## 2020-01-14 DIAGNOSIS — M5127 Other intervertebral disc displacement, lumbosacral region: Secondary | ICD-10-CM | POA: Diagnosis not present

## 2020-01-14 DIAGNOSIS — M461 Sacroiliitis, not elsewhere classified: Secondary | ICD-10-CM | POA: Diagnosis not present

## 2020-01-14 DIAGNOSIS — M25551 Pain in right hip: Secondary | ICD-10-CM | POA: Diagnosis not present

## 2020-01-14 DIAGNOSIS — M50222 Other cervical disc displacement at C5-C6 level: Secondary | ICD-10-CM | POA: Diagnosis not present

## 2020-01-14 DIAGNOSIS — M9906 Segmental and somatic dysfunction of lower extremity: Secondary | ICD-10-CM | POA: Diagnosis not present

## 2020-01-14 DIAGNOSIS — M9901 Segmental and somatic dysfunction of cervical region: Secondary | ICD-10-CM | POA: Diagnosis not present

## 2020-01-14 DIAGNOSIS — M62830 Muscle spasm of back: Secondary | ICD-10-CM | POA: Diagnosis not present

## 2020-01-14 DIAGNOSIS — M9905 Segmental and somatic dysfunction of pelvic region: Secondary | ICD-10-CM | POA: Diagnosis not present

## 2020-01-21 DIAGNOSIS — M9904 Segmental and somatic dysfunction of sacral region: Secondary | ICD-10-CM | POA: Diagnosis not present

## 2020-01-21 DIAGNOSIS — M9901 Segmental and somatic dysfunction of cervical region: Secondary | ICD-10-CM | POA: Diagnosis not present

## 2020-01-21 DIAGNOSIS — M5127 Other intervertebral disc displacement, lumbosacral region: Secondary | ICD-10-CM | POA: Diagnosis not present

## 2020-01-21 DIAGNOSIS — M9906 Segmental and somatic dysfunction of lower extremity: Secondary | ICD-10-CM | POA: Diagnosis not present

## 2020-01-21 DIAGNOSIS — M9905 Segmental and somatic dysfunction of pelvic region: Secondary | ICD-10-CM | POA: Diagnosis not present

## 2020-01-21 DIAGNOSIS — M50222 Other cervical disc displacement at C5-C6 level: Secondary | ICD-10-CM | POA: Diagnosis not present

## 2020-01-21 DIAGNOSIS — M62830 Muscle spasm of back: Secondary | ICD-10-CM | POA: Diagnosis not present

## 2020-01-21 DIAGNOSIS — M461 Sacroiliitis, not elsewhere classified: Secondary | ICD-10-CM | POA: Diagnosis not present

## 2020-01-21 DIAGNOSIS — M25551 Pain in right hip: Secondary | ICD-10-CM | POA: Diagnosis not present

## 2020-01-21 DIAGNOSIS — M9902 Segmental and somatic dysfunction of thoracic region: Secondary | ICD-10-CM | POA: Diagnosis not present

## 2020-01-24 DIAGNOSIS — M9906 Segmental and somatic dysfunction of lower extremity: Secondary | ICD-10-CM | POA: Diagnosis not present

## 2020-01-24 DIAGNOSIS — M9901 Segmental and somatic dysfunction of cervical region: Secondary | ICD-10-CM | POA: Diagnosis not present

## 2020-01-24 DIAGNOSIS — M461 Sacroiliitis, not elsewhere classified: Secondary | ICD-10-CM | POA: Diagnosis not present

## 2020-01-24 DIAGNOSIS — M9902 Segmental and somatic dysfunction of thoracic region: Secondary | ICD-10-CM | POA: Diagnosis not present

## 2020-01-24 DIAGNOSIS — M9904 Segmental and somatic dysfunction of sacral region: Secondary | ICD-10-CM | POA: Diagnosis not present

## 2020-01-24 DIAGNOSIS — M9905 Segmental and somatic dysfunction of pelvic region: Secondary | ICD-10-CM | POA: Diagnosis not present

## 2020-01-24 DIAGNOSIS — M5127 Other intervertebral disc displacement, lumbosacral region: Secondary | ICD-10-CM | POA: Diagnosis not present

## 2020-01-24 DIAGNOSIS — M25551 Pain in right hip: Secondary | ICD-10-CM | POA: Diagnosis not present

## 2020-01-24 DIAGNOSIS — M62830 Muscle spasm of back: Secondary | ICD-10-CM | POA: Diagnosis not present

## 2020-01-24 DIAGNOSIS — M50222 Other cervical disc displacement at C5-C6 level: Secondary | ICD-10-CM | POA: Diagnosis not present

## 2020-01-28 DIAGNOSIS — M62830 Muscle spasm of back: Secondary | ICD-10-CM | POA: Diagnosis not present

## 2020-01-28 DIAGNOSIS — M5127 Other intervertebral disc displacement, lumbosacral region: Secondary | ICD-10-CM | POA: Diagnosis not present

## 2020-01-28 DIAGNOSIS — M25551 Pain in right hip: Secondary | ICD-10-CM | POA: Diagnosis not present

## 2020-01-28 DIAGNOSIS — M9902 Segmental and somatic dysfunction of thoracic region: Secondary | ICD-10-CM | POA: Diagnosis not present

## 2020-01-28 DIAGNOSIS — M9901 Segmental and somatic dysfunction of cervical region: Secondary | ICD-10-CM | POA: Diagnosis not present

## 2020-01-28 DIAGNOSIS — M461 Sacroiliitis, not elsewhere classified: Secondary | ICD-10-CM | POA: Diagnosis not present

## 2020-01-28 DIAGNOSIS — M9904 Segmental and somatic dysfunction of sacral region: Secondary | ICD-10-CM | POA: Diagnosis not present

## 2020-01-28 DIAGNOSIS — M9906 Segmental and somatic dysfunction of lower extremity: Secondary | ICD-10-CM | POA: Diagnosis not present

## 2020-01-28 DIAGNOSIS — M50222 Other cervical disc displacement at C5-C6 level: Secondary | ICD-10-CM | POA: Diagnosis not present

## 2020-01-28 DIAGNOSIS — M9905 Segmental and somatic dysfunction of pelvic region: Secondary | ICD-10-CM | POA: Diagnosis not present

## 2020-01-31 DIAGNOSIS — M50222 Other cervical disc displacement at C5-C6 level: Secondary | ICD-10-CM | POA: Diagnosis not present

## 2020-01-31 DIAGNOSIS — M25551 Pain in right hip: Secondary | ICD-10-CM | POA: Diagnosis not present

## 2020-01-31 DIAGNOSIS — M461 Sacroiliitis, not elsewhere classified: Secondary | ICD-10-CM | POA: Diagnosis not present

## 2020-01-31 DIAGNOSIS — M62830 Muscle spasm of back: Secondary | ICD-10-CM | POA: Diagnosis not present

## 2020-01-31 DIAGNOSIS — M9902 Segmental and somatic dysfunction of thoracic region: Secondary | ICD-10-CM | POA: Diagnosis not present

## 2020-01-31 DIAGNOSIS — M5127 Other intervertebral disc displacement, lumbosacral region: Secondary | ICD-10-CM | POA: Diagnosis not present

## 2020-01-31 DIAGNOSIS — M9901 Segmental and somatic dysfunction of cervical region: Secondary | ICD-10-CM | POA: Diagnosis not present

## 2020-01-31 DIAGNOSIS — M9905 Segmental and somatic dysfunction of pelvic region: Secondary | ICD-10-CM | POA: Diagnosis not present

## 2020-01-31 DIAGNOSIS — M9904 Segmental and somatic dysfunction of sacral region: Secondary | ICD-10-CM | POA: Diagnosis not present

## 2020-01-31 DIAGNOSIS — M9906 Segmental and somatic dysfunction of lower extremity: Secondary | ICD-10-CM | POA: Diagnosis not present

## 2020-02-07 DIAGNOSIS — M9902 Segmental and somatic dysfunction of thoracic region: Secondary | ICD-10-CM | POA: Diagnosis not present

## 2020-02-07 DIAGNOSIS — M461 Sacroiliitis, not elsewhere classified: Secondary | ICD-10-CM | POA: Diagnosis not present

## 2020-02-07 DIAGNOSIS — M9904 Segmental and somatic dysfunction of sacral region: Secondary | ICD-10-CM | POA: Diagnosis not present

## 2020-02-07 DIAGNOSIS — M25551 Pain in right hip: Secondary | ICD-10-CM | POA: Diagnosis not present

## 2020-02-07 DIAGNOSIS — M9901 Segmental and somatic dysfunction of cervical region: Secondary | ICD-10-CM | POA: Diagnosis not present

## 2020-02-07 DIAGNOSIS — M50222 Other cervical disc displacement at C5-C6 level: Secondary | ICD-10-CM | POA: Diagnosis not present

## 2020-02-07 DIAGNOSIS — M62830 Muscle spasm of back: Secondary | ICD-10-CM | POA: Diagnosis not present

## 2020-02-07 DIAGNOSIS — M9906 Segmental and somatic dysfunction of lower extremity: Secondary | ICD-10-CM | POA: Diagnosis not present

## 2020-02-07 DIAGNOSIS — M9905 Segmental and somatic dysfunction of pelvic region: Secondary | ICD-10-CM | POA: Diagnosis not present

## 2020-02-07 DIAGNOSIS — M5127 Other intervertebral disc displacement, lumbosacral region: Secondary | ICD-10-CM | POA: Diagnosis not present

## 2020-02-18 DIAGNOSIS — M461 Sacroiliitis, not elsewhere classified: Secondary | ICD-10-CM | POA: Diagnosis not present

## 2020-02-18 DIAGNOSIS — M9901 Segmental and somatic dysfunction of cervical region: Secondary | ICD-10-CM | POA: Diagnosis not present

## 2020-02-18 DIAGNOSIS — M50222 Other cervical disc displacement at C5-C6 level: Secondary | ICD-10-CM | POA: Diagnosis not present

## 2020-02-18 DIAGNOSIS — M9902 Segmental and somatic dysfunction of thoracic region: Secondary | ICD-10-CM | POA: Diagnosis not present

## 2020-02-18 DIAGNOSIS — M9906 Segmental and somatic dysfunction of lower extremity: Secondary | ICD-10-CM | POA: Diagnosis not present

## 2020-02-18 DIAGNOSIS — M25551 Pain in right hip: Secondary | ICD-10-CM | POA: Diagnosis not present

## 2020-02-18 DIAGNOSIS — M9905 Segmental and somatic dysfunction of pelvic region: Secondary | ICD-10-CM | POA: Diagnosis not present

## 2020-02-18 DIAGNOSIS — M9904 Segmental and somatic dysfunction of sacral region: Secondary | ICD-10-CM | POA: Diagnosis not present

## 2020-02-18 DIAGNOSIS — M5127 Other intervertebral disc displacement, lumbosacral region: Secondary | ICD-10-CM | POA: Diagnosis not present

## 2020-02-18 DIAGNOSIS — M62830 Muscle spasm of back: Secondary | ICD-10-CM | POA: Diagnosis not present

## 2020-02-21 DIAGNOSIS — M25551 Pain in right hip: Secondary | ICD-10-CM | POA: Diagnosis not present

## 2020-02-21 DIAGNOSIS — M5127 Other intervertebral disc displacement, lumbosacral region: Secondary | ICD-10-CM | POA: Diagnosis not present

## 2020-02-21 DIAGNOSIS — M50222 Other cervical disc displacement at C5-C6 level: Secondary | ICD-10-CM | POA: Diagnosis not present

## 2020-02-21 DIAGNOSIS — M62830 Muscle spasm of back: Secondary | ICD-10-CM | POA: Diagnosis not present

## 2020-02-21 DIAGNOSIS — M461 Sacroiliitis, not elsewhere classified: Secondary | ICD-10-CM | POA: Diagnosis not present

## 2020-02-21 DIAGNOSIS — M9902 Segmental and somatic dysfunction of thoracic region: Secondary | ICD-10-CM | POA: Diagnosis not present

## 2020-02-21 DIAGNOSIS — M9906 Segmental and somatic dysfunction of lower extremity: Secondary | ICD-10-CM | POA: Diagnosis not present

## 2020-02-21 DIAGNOSIS — M9905 Segmental and somatic dysfunction of pelvic region: Secondary | ICD-10-CM | POA: Diagnosis not present

## 2020-02-21 DIAGNOSIS — M9901 Segmental and somatic dysfunction of cervical region: Secondary | ICD-10-CM | POA: Diagnosis not present

## 2020-02-21 DIAGNOSIS — M9904 Segmental and somatic dysfunction of sacral region: Secondary | ICD-10-CM | POA: Diagnosis not present

## 2020-02-25 DIAGNOSIS — H409 Unspecified glaucoma: Secondary | ICD-10-CM | POA: Diagnosis not present

## 2020-02-25 DIAGNOSIS — M25551 Pain in right hip: Secondary | ICD-10-CM | POA: Diagnosis not present

## 2020-02-25 DIAGNOSIS — M9905 Segmental and somatic dysfunction of pelvic region: Secondary | ICD-10-CM | POA: Diagnosis not present

## 2020-02-25 DIAGNOSIS — R030 Elevated blood-pressure reading, without diagnosis of hypertension: Secondary | ICD-10-CM | POA: Diagnosis not present

## 2020-02-25 DIAGNOSIS — M50222 Other cervical disc displacement at C5-C6 level: Secondary | ICD-10-CM | POA: Diagnosis not present

## 2020-02-25 DIAGNOSIS — M9906 Segmental and somatic dysfunction of lower extremity: Secondary | ICD-10-CM | POA: Diagnosis not present

## 2020-02-25 DIAGNOSIS — Z809 Family history of malignant neoplasm, unspecified: Secondary | ICD-10-CM | POA: Diagnosis not present

## 2020-02-25 DIAGNOSIS — M9901 Segmental and somatic dysfunction of cervical region: Secondary | ICD-10-CM | POA: Diagnosis not present

## 2020-02-25 DIAGNOSIS — Z833 Family history of diabetes mellitus: Secondary | ICD-10-CM | POA: Diagnosis not present

## 2020-02-25 DIAGNOSIS — M62830 Muscle spasm of back: Secondary | ICD-10-CM | POA: Diagnosis not present

## 2020-02-25 DIAGNOSIS — M5127 Other intervertebral disc displacement, lumbosacral region: Secondary | ICD-10-CM | POA: Diagnosis not present

## 2020-02-25 DIAGNOSIS — M9902 Segmental and somatic dysfunction of thoracic region: Secondary | ICD-10-CM | POA: Diagnosis not present

## 2020-02-25 DIAGNOSIS — M9904 Segmental and somatic dysfunction of sacral region: Secondary | ICD-10-CM | POA: Diagnosis not present

## 2020-02-25 DIAGNOSIS — M461 Sacroiliitis, not elsewhere classified: Secondary | ICD-10-CM | POA: Diagnosis not present

## 2020-02-25 DIAGNOSIS — Z720 Tobacco use: Secondary | ICD-10-CM | POA: Diagnosis not present

## 2020-03-04 DIAGNOSIS — H8110 Benign paroxysmal vertigo, unspecified ear: Secondary | ICD-10-CM | POA: Diagnosis not present

## 2020-03-10 ENCOUNTER — Ambulatory Visit (HOSPITAL_BASED_OUTPATIENT_CLINIC_OR_DEPARTMENT_OTHER)
Admission: RE | Admit: 2020-03-10 | Discharge: 2020-03-10 | Disposition: A | Discharge status: Home / Self Care | Payer: Medicare HMO | Source: Ambulatory Visit | Attending: Medical | Admitting: Medical

## 2020-03-10 ENCOUNTER — Telehealth: Payer: Self-pay | Admitting: Medical

## 2020-03-10 ENCOUNTER — Other Ambulatory Visit: Payer: Self-pay

## 2020-03-10 ENCOUNTER — Ambulatory Visit (INDEPENDENT_AMBULATORY_CARE_PROVIDER_SITE_OTHER): Payer: Medicare HMO | Admitting: Medical

## 2020-03-10 VITALS — BP 140/82 | HR 96 | Resp 18 | Ht 73.0 in | Wt 159.8 lb

## 2020-03-10 DIAGNOSIS — H814 Vertigo of central origin: Secondary | ICD-10-CM | POA: Diagnosis present

## 2020-03-10 DIAGNOSIS — G629 Polyneuropathy, unspecified: Secondary | ICD-10-CM

## 2020-03-10 DIAGNOSIS — R42 Dizziness and giddiness: Secondary | ICD-10-CM

## 2020-03-10 DIAGNOSIS — J341 Cyst and mucocele of nose and nasal sinus: Secondary | ICD-10-CM

## 2020-03-10 DIAGNOSIS — G319 Degenerative disease of nervous system, unspecified: Secondary | ICD-10-CM | POA: Insufficient documentation

## 2020-03-10 DIAGNOSIS — J3489 Other specified disorders of nose and nasal sinuses: Secondary | ICD-10-CM | POA: Diagnosis not present

## 2020-03-10 DIAGNOSIS — M5090 Cervical disc disorder, unspecified, unspecified cervical region: Secondary | ICD-10-CM | POA: Diagnosis not present

## 2020-03-10 DIAGNOSIS — M5416 Radiculopathy, lumbar region: Secondary | ICD-10-CM

## 2020-03-10 DIAGNOSIS — J309 Allergic rhinitis, unspecified: Secondary | ICD-10-CM | POA: Diagnosis not present

## 2020-03-10 DIAGNOSIS — M542 Cervicalgia: Secondary | ICD-10-CM | POA: Diagnosis not present

## 2020-03-10 DIAGNOSIS — H938X3 Other specified disorders of ear, bilateral: Secondary | ICD-10-CM

## 2020-03-10 DIAGNOSIS — G9389 Other specified disorders of brain: Secondary | ICD-10-CM | POA: Diagnosis not present

## 2020-03-10 LAB — COMPREHENSIVE METABOLIC PANEL
ALT: 17 U/L (ref 0–53)
AST: 24 U/L (ref 0–37)
Albumin: 4.5 g/dL (ref 3.5–5.2)
Alkaline Phosphatase: 52 U/L (ref 39–117)
BUN: 8 mg/dL (ref 6–23)
CO2: 28 mEq/L (ref 19–32)
Calcium: 9.8 mg/dL (ref 8.4–10.5)
Chloride: 101 mEq/L (ref 96–112)
Creatinine, Ser: 0.87 mg/dL (ref 0.40–1.50)
GFR: 87.87 mL/min (ref >60.00)
Glucose, Bld: 104 mg/dL — ABNORMAL HIGH (ref 70–99)
Potassium: 4.6 mEq/L (ref 3.5–5.1)
Sodium: 134 mEq/L — ABNORMAL LOW (ref 135–145)
Total Bilirubin: 0.5 mg/dL (ref 0.2–1.2)
Total Protein: 7.2 g/dL (ref 6.0–8.3)

## 2020-03-10 MED ORDER — FLUTICASONE PROPIONATE 50 MCG/ACT NA SUSP: 2.0000 | Freq: Every day | NASAL | 1 refills | Status: DC

## 2020-03-10 MED ORDER — DICLOFENAC SODIUM 75 MG PO TBEC: 75.0000 mg | DELAYED_RELEASE_TABLET | Freq: Two times a day (BID) | ORAL | 0 refills | Status: DC

## 2020-03-10 MED ORDER — GABAPENTIN 100 MG PO CAPS: 100.0000 mg | ORAL_CAPSULE | Freq: Every day | ORAL | 0 refills | Status: DC

## 2020-03-10 MED ORDER — MECLIZINE HCL 12.5 MG PO TABS: 12.5000 mg | ORAL_TABLET | Freq: Three times a day (TID) | ORAL | 0 refills | Status: DC | PRN

## 2020-03-10 MED ORDER — AMOXICILLIN-POT CLAVULANATE 875-125 MG PO TABS: 1.0000 | ORAL_TABLET | Freq: Two times a day (BID) | ORAL | 0 refills | Status: DC

## 2020-03-10 MED FILL — MECLIZINE 12.5 MG CAPLET: 12.5 | 10 days supply | Qty: 30 | Fill #0

## 2020-03-10 MED FILL — GABAPENTIN 100 MG CAPSULE: 100 | 30 days supply | Qty: 30 | Fill #0

## 2020-03-10 MED FILL — DICLOFENAC SODIUM 75 MG TAB: 75 | 10 days supply | Qty: 20 | Fill #0

## 2020-03-10 NOTE — Progress Notes (Signed)
Subjective:    Patient ID: Brett [name], male    DOB: 1956-02-27, 64 y.o.   MRN: [id number]  HPI  Pt has some vertigo/dizziness also going on for about 2 weeks. He has 2 or more  alcohol beverage a day.  Describe moderate to heavy. He describes to me more off balance slightly. Then decribes faint vertigo. Some pressure in ears. No sneezing. Feels constant pnd. Dizziness comes and goes. Sometimes position related sitting to standing. Early on 2 weeks ago he turned his head and would get some vertigo. Worse on March 04, 2020.  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  Pt wanders and daughter  if he needs ct of head indicates.   Pt states back in March he had some sciatica issues. 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 He had 2 surgeries for that in past. Had replacements surgery in the past(hip replacement in the past rt side.) He states left side sciatica is more severe. But some rt side. Pt had recent low back xray and rt hip xray. Stated surgery would be difficult/not need for rt hip.   Pt wants potential PT. Pt had xray of lumbar spine but no mri. Last saw orthopedist a year and half. Told to hold off getting back surgery as long as you he can. Pt having 6 months of sciatica type pain. Pain will  Radiate often to his foot. States electric shock sensation.  Pt has seen chiropracter for neck and low back pain. But is it not helping.    Review of Systems  Constitutional: Negative for chills, fatigue and fever.  Respiratory: Negative for cough, chest tightness, shortness of breath and wheezing.   Cardiovascular: Negative for chest pain and palpitations.  Gastrointestinal: Negative for abdominal pain, nausea and vomiting.  Musculoskeletal: Positive for back pain and neck pain.  Skin: Negative for rash.  Neurological: Positive for dizziness. Negative for seizures, speech difficulty and weakness.  Hematological: Negative for adenopathy. Does not bruise/bleed easily.  Psychiatric/Behavioral:  Negative for behavioral problems, dysphoric mood and self-injury.    Past Medical History:  Diagnosis Date  . Allergy   . Arthritis   . Blood transfusion without reported diagnosis   . Chicken pox   . Congenital hip deformity   . Glaucoma      Social History   Socioeconomic History  . Marital status: Widowed    Spouse name: Not on file  . Number of children: Not on file  . Years of education: Not on file  . Highest education level: Not on file  Occupational History  . Not on file  Tobacco Use  . Smoking status: Current Every Day Smoker    Packs/day: 0.50    Types: Cigarettes  . Smokeless tobacco: Never Used  . Tobacco comment: 0.5 ppd  Substance and Sexual Activity  . Alcohol use: Yes    Alcohol/week: 14.0 - 16.0 standard drinks    Types: 14 - 16 Cans of beer per week  . Drug use: No  . Sexual activity: Not on file  Other Topics Concern  . Not on file  Social History Narrative   Quality control Tech- gauges/callibrations.     Some colleg/tech school   wife passed   3 grown children (oldest daughter is living with them) youngest daughter lives in [place].  Curator at [employer].  Son lives near [place]- framing/art.   Social Determinants of Health   Financial Resource Strain:   . Difficulty of Paying Living Expenses:  Food Insecurity:   . Worried About Charity fundraiser in the Last Year:   . Arboriculturist in the Last Year:   Transportation Needs:   . Film/video editor (Medical):   [name] Lack of Transportation (Non-Medical):   Physical Activity:   . Days of Exercise per Week:   . Minutes of Exercise per Session:   Stress:   . Feeling of Stress :   Social Connections:   . Frequency of Communication with Friends and Family:   . Frequency of Social Gatherings with Friends and Family:   . Attends Religious Services:   . Active Member of Clubs or Organizations:   . Attends Archivist Meetings:   [name] Marital Status:   Intimate Partner  Violence:   . Fear of Current or Ex-Partner:   . Emotionally Abused:   [name] Physically Abused:   . Sexually Abused:     Past Surgical History:  Procedure Laterality Date  . COLONOSCOPY    . HAMMER TOE SURGERY  07/28/2011   Procedure: HAMMER TOE CORRECTION;  Surgeon: Ninetta Lights, MD;  Location: [facility];  Service: Orthopedics;  Laterality: Right;  right 2nd and 4th toes correction hammer toe, capsulotomy metatarsal-phalangeal joints  . [place] 2010   rt total hip-8/10-multiple rt hip surgeries- had 4 prior to 1986 as a child  . THUMB ARTHROSCOPY  2008   rt    Family History  Problem Relation Age of Onset  . Cancer Mother 76       history of colon cancer  . Rosacea Mother   . Colon cancer Mother   . Rosacea Father   . Lung disease Father        ?pulmonary fibrosis  . Cancer Cousin        colon  . Colon cancer Cousin   . Colon cancer Maternal Grandmother   . Alcohol abuse Brother   . Depression Brother   . Thyroid disease Son        ?hyperthyroid  . 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 Esophageal cancer Neg Hx   . Rectal cancer Neg Hx   . Stomach cancer Neg Hx     No Known Allergies  Current Outpatient Medications on File Prior to Visit  Medication Sig Dispense Refill  . acetaminophen (TYLENOL) 500 MG tablet Take 500-1,500 mg by mouth daily as needed.    . Ascorbic Acid (VITAMIN C) 500 MG CAPS Take 1 capsule by mouth daily.    [name] b complex vitamins capsule Take 1 capsule by mouth daily.    . celecoxib (CELEBREX) 200 MG capsule Take by mouth daily.    . cyclobenzaprine (FLEXERIL) 10 MG tablet cyclobenzaprine 10 mg tablet  Take 1 tablet 3 times a day by oral route.    . loratadine (CLARITIN) 10 MG tablet Take 1 tablet (10 mg total) by mouth daily. 30 tablet 11  . Multiple Vitamin (MULTIVITAMIN) tablet Take 1 tablet by mouth daily.    . Omega-3 Fatty Acids (OMEGA-3 FISH OIL) 500 MG CAPS Take 1 capsule by mouth  daily.    . sildenafil (REVATIO) 20 MG tablet 1-2 tabs by mouth as needed prior to sexual activity 50 tablet 1  . TRAVATAN Z 0.004 % SOLN ophthalmic solution 1 drop each eye daily  1   No current facility-administered medications on file prior to visit.    BP 140/82 (  BP Location: Left Arm, Patient Position: Sitting, Cuff Size: Normal)   Pulse 96   Resp 18   Ht 6\' 1"  (1.854 m)   Wt 159 lb 12.8 oz (72.5 kg)   SpO2 97%   BMI 21.08 kg/m       Objective:   Physical Exam  General Mental Status- Alert. General Appearance- Not in acute distress.   Skin General: Color- Normal Color. Moisture- Normal Moisture.  Neck Carotid Arteries- Normal color. Moisture- Normal Moisture. No carotid bruits. No JVD. Mid c spine tenderness to palpation.  Chest and Lung Exam Auscultation: Breath Sounds:-Normal.  Cardiovascular Auscultation:Rythm- Regular. Murmurs & Other Heart Sounds:Auscultation of the heart reveals- No Murmurs.  Abdomen Inspection:-Inspeection Normal. Palpation/Percussion:Note:No mass. Palpation and Percussion of the abdomen reveal- Non Tender, Non Distended + BS, no rebound or guarding.    Neurologic Cranial Nerve exam:- CN III-XII intact(No nystagmus), symmetric smile. Drift Test:- No drift. Romberg Exam:- Negative.  Heal to Toe Gait exam:-Normal. Finger to Nose:- Normal/Intact  Strength:- 5/5 equal and symmetric strength both upper and lower extremities. Sitting up from supiine to sitting mild dizziness. Laying supine and turning head no dizziness.   Lumbar- mid lumbar and left si tenderness to palpation.  Lower ext- l5-s1 sensation intact bilaterally.   heent- no sinus pressure. +pnd.        Assessment & Plan:  Nice to meet you today.  You have had intermittent dizziness with some mixed vertigo over the past 2 weeks.  Presently have good neurologic exam.  You and your daughter question whether or not you need CT.  Since your symptoms are persisting for 2  weeks or more decided to go ahead and get CT of head without contrast.  Will need to get prior authorization and see if that is approved.  During the interim if you have worse signs and symptoms associated with dizziness then recommend ED evaluation.  In that event they can do CT without prior authorization.  I will prescribe meclizine to use as needed/as discussed.  Also go ahead and get CBC and CMP.  Recent potential allergy signs symptoms with possible eustachian tube dysfunction.  Prescription of Flonase given.  For back pain history with 6 months of radicular/sciatica type pain, I did go ahead and place referral to your orthopedic practice.  Specifying if they can get you in with a back specialist.  I think you would benefit from MRI.  Prescribe diclofenac today and gabapentin.  For neck pain, did place order to get cervical spine x-ray today.  Consider holding off on chiropractic manipulation until cervical spine x-ray reviewed.  For neuropathy, did place order for B12 and B1 today.  Follow-up in 2 weeks with PCP or as needed.  Mackie Pai, PA-C   Time spent with patient today was 46  minutes which consisted of chart revdiew, discussing diagnosis, work up treatment and documentation.

## 2020-03-10 NOTE — Telephone Encounter (Signed)
Would you see patient CT of head order and see if you can get prior authorization.  Today or tomorrow at the latest.

## 2020-03-10 NOTE — Telephone Encounter (Signed)
Brett [name] has been approved and imaging notified. Imaging will call pt to schedule as STAT.

## 2020-03-10 NOTE — Patient Instructions (Addendum)
Nice to meet you today.  You have had intermittent dizziness with some mixed vertigo over the past 2 weeks.  Presently have good neurologic exam.  You and your daughter question whether or not you need CT.  Since your symptoms are persisting for 2 weeks or more decided to go ahead and get CT of head without contrast.  Will need to get prior authorization and see if that is approved.  During the interim if you have worse signs and symptoms associated with dizziness then recommend ED evaluation.  In that event they can do CT without prior authorization.  I will prescribe meclizine to use as needed/as discussed.  Also go ahead and get CBC and CMP.  Recent potential allergy signs symptoms with possible eustachian tube dysfunction.  Prescription of Flonase given.  For back pain history with 6 months of radicular/sciatica type pain, I did go ahead and place referral to your orthopedic practice.  Specifying if they can get you in with a back specialist.  I think you would benefit from MRI.  Prescribe diclofenac today and gabapentin.  For neck pain, did place order to get cervical spine x-ray today.  Consider holding off on chiropractic manipulation until cervical spine x-ray reviewed.  For neuropathy, did place order for B12 and B1 today.  Follow-up in 2 weeks with PCP or as needed.

## 2020-03-10 NOTE — Telephone Encounter (Signed)
Rx augmentin sent to pt pharmacy. 

## 2020-03-11 LAB — CBC WITH DIFFERENTIAL/PLATELET
Basophils Absolute: 0.1 10*3/uL (ref 0.0–0.1)
Basophils Relative: 0.9 % (ref 0.0–3.0)
Eosinophils Absolute: 0.1 10*3/uL (ref 0.0–0.7)
Eosinophils Relative: 1.6 % (ref 0.0–5.0)
HCT: 48.3 % (ref 39.0–52.0)
Hemoglobin: 16.4 g/dL (ref 13.0–17.0)
Lymphocytes Relative: 17.9 % (ref 12.0–46.0)
Lymphs Abs: 1.5 10*3/uL (ref 0.7–4.0)
MCHC: 34 g/dL (ref 30.0–36.0)
MCV: 103.3 fl — ABNORMAL HIGH (ref 78.0–100.0)
Monocytes Absolute: 0.9 10*3/uL (ref 0.1–1.0)
Monocytes Relative: 10.7 % (ref 3.0–12.0)
Neutro Abs: 5.6 10*3/uL (ref 1.4–7.7)
Neutrophils Relative %: 68.9 % (ref 43.0–77.0)
Platelets: 165 10*3/uL (ref 150.0–400.0)
RBC: 4.67 Mil/uL (ref 4.22–5.81)
RDW: 12.7 % (ref 11.5–15.5)
WBC: 8.2 10*3/uL (ref 4.0–10.5)

## 2020-03-11 LAB — VITAMIN B12: Vitamin B-12: 548 pg/mL (ref 211–911)

## 2020-03-11 MED FILL — AMOX-CLAV 875-125 MG TABLET: 875-125 | 10 days supply | Qty: 20 | Fill #0

## 2020-03-12 ENCOUNTER — Telehealth: Payer: Self-pay | Admitting: Medical

## 2020-03-12 ENCOUNTER — Encounter: Payer: Self-pay | Admitting: Medical

## 2020-03-12 MED ORDER — AMOXICILLIN-POT CLAVULANATE 875-125 MG PO TABS: 1.0000 | ORAL_TABLET | Freq: Two times a day (BID) | ORAL | 0 refills | Status: DC

## 2020-03-12 NOTE — Telephone Encounter (Signed)
I sent in augmentin to pt pharmacy. Looks like Pharmacist, hospital. He sent my chart message stating no one called him so it is not ready?? Will you call pt and see which pharmacy he uses. [facility] would have filled?

## 2020-03-12 NOTE — Telephone Encounter (Signed)
Pt.notified

## 2020-03-12 NOTE — Telephone Encounter (Signed)
Pt called and lvm to return call 

## 2020-03-12 NOTE — Telephone Encounter (Signed)
Patient states he would like the medication sent to [facility] on [address] since it is closer to him

## 2020-03-12 NOTE — Telephone Encounter (Signed)
[facility]

## 2020-03-12 NOTE — Telephone Encounter (Signed)
[facility]

## 2020-03-14 LAB — VITAMIN B1: Vitamin B1 (Thiamine): 18 nmol/L (ref 8–30)

## 2020-04-08 ENCOUNTER — Ambulatory Visit (INDEPENDENT_AMBULATORY_CARE_PROVIDER_SITE_OTHER): Payer: Medicare HMO | Admitting: Otolaryngology

## 2020-04-08 ENCOUNTER — Other Ambulatory Visit: Payer: Self-pay

## 2020-04-08 ENCOUNTER — Encounter (INDEPENDENT_AMBULATORY_CARE_PROVIDER_SITE_OTHER): Payer: Self-pay | Admitting: Otolaryngology

## 2020-04-08 VITALS — Temp 97.5°F

## 2020-04-08 DIAGNOSIS — R42 Dizziness and giddiness: Secondary | ICD-10-CM | POA: Diagnosis not present

## 2020-04-08 DIAGNOSIS — J341 Cyst and mucocele of nose and nasal sinus: Secondary | ICD-10-CM

## 2020-04-08 NOTE — Progress Notes (Addendum)
HPI: Brett [name] is a 64 y.o. male who presents is referred by Mackie Pai, PA for evaluation of left maxillary sinus mucous cyst noted on recent CT scan.  Patient initially had some problems with dizziness about 2 months ago which was diagnosed as central etiology of dizziness as he had no vertigo or ear problems.  He underwent a CT scan of his head that showed a small left maxillary sinus cyst.  I reviewed the scan patient has a 1 cm cyst within the left maxillary sinus that is nonobstructing with clear paranasal sinuses otherwise.  The mastoid and middle ear spaces were clear bilaterally.  He does complain of some postnasal drainage which is generally clear.  He has no paranasal pain or discomfort otherwise.[name] His dizziness is more of feeling off balance and he does not describe any vertigo or spinning sensation.  Past Medical History:  Diagnosis Date  . Allergy   . Arthritis   . Blood transfusion without reported diagnosis   . Chicken pox   . Congenital hip deformity   . Glaucoma    Past Surgical History:  Procedure Laterality Date  . COLONOSCOPY    . HAMMER TOE SURGERY  07/28/2011   Procedure: HAMMER TOE CORRECTION;  Surgeon: Ninetta Lights, MD;  Location: [facility];  Service: Orthopedics;  Laterality: Right;  right 2nd and 4th toes correction hammer toe, capsulotomy metatarsal-phalangeal joints  . [place] 2010   rt total hip-8/10-multiple rt hip surgeries- had 4 prior to 1986 as a child  . THUMB ARTHROSCOPY  2008   rt   Social History   Socioeconomic History  . Marital status: Widowed    Spouse name: Not on file  . Number of children: Not on file  . Years of education: Not on file  . Highest education level: Not on file  Occupational History  . Not on file  Tobacco Use  . Smoking status: Current Every Day Smoker    Packs/day: 0.50    Years: 46.00    Pack years: 23.00    Types: Cigarettes    Start date: 27  . Smokeless tobacco: Never  Used  . Tobacco comment: 0.5 ppd  Substance and Sexual Activity  . Alcohol use: Yes    Alcohol/week: 14.0 - 16.0 standard drinks    Types: 14 - 16 Cans of beer per week  . Drug use: No  . Sexual activity: Not on file  Other Topics Concern  . 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   Social Determinants of Health   Financial Resource Strain:   . Difficulty of Paying Living Expenses: Not on file  Food Insecurity:   . Worried About Charity fundraiser in the Last Year: Not on file  . Ran Out of Food in the Last Year: Not on file  Transportation Needs:   . Lack of Transportation (Medical): Not on file  . Lack of Transportation (Non-Medical): Not on file  Physical Activity:   . Days of Exercise per Week: Not on file  . Minutes of Exercise per Session: Not on file  Stress:   . Feeling of Stress : Not on file  Social Connections:   . Frequency  of Communication with Friends and Family: Not on file  . Frequency of Social Gatherings with Friends and Family: Not on file  . Attends Religious Services: Not on file  . Active Member of Clubs or Organizations: Not on file  . Attends Archivist Meetings: Not on file  . Marital Status: Not on file   Family History  Problem Relation Age of Onset  . Cancer Mother 15       history of colon cancer  . Rosacea Mother   . Colon cancer Mother   . Rosacea Father   . Lung disease Father        ?pulmonary fibrosis  . Cancer Cousin        colon  . Colon cancer Cousin   . Colon cancer Maternal Grandmother   . Alcohol abuse Brother   . Depression Brother   . Thyroid disease Son        ?hyperthyroid  . 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 Esophageal cancer  Neg Hx   . Rectal cancer Neg Hx   . Stomach cancer Neg Hx    No Known Allergies Prior to Admission medications   Medication Sig Start Date End Date Taking? Authorizing Provider  acetaminophen (TYLENOL) 500 MG tablet Take 500-1,500 mg by mouth daily as needed.   Yes [provider]  amoxicillin-clavulanate (AUGMENTIN) 875-125 MG tablet Take 1 tablet by mouth 2 (two) times daily. 03/12/20  Yes Saguier, Percell Miller, PA-C  Ascorbic Acid (VITAMIN C) 500 MG CAPS Take 1 capsule by mouth daily.   Yes [provider]  b complex vitamins capsule Take 1 capsule by mouth daily. 03/24/17  Yes Debbrah Alar, NP  diclofenac (VOLTAREN) 75 MG EC tablet Take 1 tablet (75 mg total) by mouth 2 (two) times daily. 03/10/20  Yes Saguier, Percell Miller, PA-C  fluticasone (FLONASE) 50 MCG/ACT nasal spray Place 2 sprays into both nostrils daily. 03/10/20  Yes Saguier, Percell Miller, PA-C  gabapentin (NEURONTIN) 100 MG capsule Take 1 capsule (100 mg total) by mouth at bedtime. 03/10/20  Yes Saguier, Percell Miller, PA-C  meclizine (ANTIVERT) 12.5 MG tablet Take 1 tablet (12.5 mg total) by mouth 3 (three) times daily as needed for dizziness. 03/10/20  Yes Saguier, Percell Miller, PA-C  Multiple Vitamin (MULTIVITAMIN) tablet Take 1 tablet by mouth daily.   Yes [provider]  Omega-3 Fatty Acids (OMEGA-3 FISH OIL) 500 MG CAPS Take 1 capsule by mouth daily.   Yes [provider]  TRAVATAN Z 0.004 % SOLN ophthalmic solution 1 drop each eye daily 01/20/15  Yes [provider]  celecoxib (CELEBREX) 200 MG capsule Take by mouth daily. 01/14/19   [provider]  cyclobenzaprine (FLEXERIL) 10 MG tablet cyclobenzaprine 10 mg tablet  Take 1 tablet 3 times a day by oral route.    [provider]  loratadine (CLARITIN) 10 MG tablet Take 1 tablet (10 mg total) by mouth daily. 04/19/19   Debbrah Alar, NP  sildenafil (REVATIO) 20 MG tablet 1-2 tabs by mouth as needed prior to sexual activity 04/13/18    Debbrah Alar, NP     Positive ROS: Otherwise negative  All other systems have been reviewed and were otherwise negative with the exception of those mentioned in the HPI and as above.  Physical Exam: Constitutional: Alert, well-appearing, no acute distress Ears: External ears without lesions or tenderness. Ear canals are clear bilaterally with intact, clear TMs bilaterally.  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  Dix-Hallpike testing revealed no clinical evidence of BPPV. Nasal: External nose without lesions. Septum midline with mild rhinitis.  After decongesting the nose both middle meatus regions were clear with no signs of infection..  Oral: Lips and gums without lesions. Tongue and palate mucosa without lesions. Posterior oropharynx clear. Neck: No palpable adenopathy or masses Respiratory: Breathing comfortably  Skin: No facial/neck lesions or rash noted.  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 He does have mild rhinitis and forced postnasal drainage recommended use of Flonase 2 sprays each nostril at night and use of saline irrigation during the daytime to help with the postnasal drainage. No evidence of BPPV and if balance problems persist would recommend further treatment with PT vestibular rehab  Plan: Reviewed the mucous retention cyst with the patient in the office today.  For the postnasal drainage suggested use of Flonase and/or saline nasal irrigation. He apparently has chronic neck issues and this could also be contributing some to his balance problems but treatment of his balance problems would most likely involve physical therapy and vestibular rehab if symptoms do not improve and reviewed this with him.  May also benefit by seeing neurology for evaluation of dizziness.   Radene Journey,  MD   CC:

## 2020-04-10 DIAGNOSIS — M5136 Other intervertebral disc degeneration, lumbar region: Secondary | ICD-10-CM | POA: Diagnosis not present

## 2020-04-10 DIAGNOSIS — M545 Low back pain: Secondary | ICD-10-CM | POA: Diagnosis not present

## 2020-04-11 ENCOUNTER — Telehealth: Payer: Self-pay | Admitting: Family

## 2020-04-11 DIAGNOSIS — Z122 Encounter for screening for malignant neoplasm of respiratory organs: Secondary | ICD-10-CM

## 2020-04-11 DIAGNOSIS — Z720 Tobacco use: Secondary | ICD-10-CM

## 2020-04-11 NOTE — Telephone Encounter (Signed)
See mychart.  

## 2020-04-11 NOTE — Telephone Encounter (Signed)
Please advise pt that I have placed an order for his annual lung cancer screening CT.

## 2020-04-15 NOTE — Telephone Encounter (Signed)
Patient advised order was placed.

## 2020-04-17 ENCOUNTER — Encounter: Payer: Self-pay | Admitting: Family

## 2020-04-22 ENCOUNTER — Other Ambulatory Visit: Payer: Self-pay

## 2020-04-22 ENCOUNTER — Ambulatory Visit (HOSPITAL_BASED_OUTPATIENT_CLINIC_OR_DEPARTMENT_OTHER): Payer: Medicare HMO

## 2020-04-22 ENCOUNTER — Ambulatory Visit (HOSPITAL_BASED_OUTPATIENT_CLINIC_OR_DEPARTMENT_OTHER)
Admission: RE | Admit: 2020-04-22 | Discharge: 2020-04-22 | Disposition: A | Discharge status: Home / Self Care | Payer: Medicare HMO | Source: Ambulatory Visit | Attending: Family | Admitting: Family

## 2020-04-22 DIAGNOSIS — Z122 Encounter for screening for malignant neoplasm of respiratory organs: Secondary | ICD-10-CM

## 2020-04-22 DIAGNOSIS — Z720 Tobacco use: Secondary | ICD-10-CM

## 2020-04-24 ENCOUNTER — Encounter: Payer: Self-pay | Admitting: Family

## 2020-04-24 ENCOUNTER — Ambulatory Visit (INDEPENDENT_AMBULATORY_CARE_PROVIDER_SITE_OTHER): Payer: Medicare HMO | Admitting: Family

## 2020-04-24 ENCOUNTER — Encounter (HOSPITAL_BASED_OUTPATIENT_CLINIC_OR_DEPARTMENT_OTHER): Payer: Self-pay

## 2020-04-24 ENCOUNTER — Other Ambulatory Visit: Payer: Self-pay

## 2020-04-24 ENCOUNTER — Ambulatory Visit (HOSPITAL_BASED_OUTPATIENT_CLINIC_OR_DEPARTMENT_OTHER): Payer: Medicare HMO

## 2020-04-24 VITALS — BP 121/65 | HR 98 | Temp 97.6°F | Resp 16 | Ht 74.0 in | Wt 157.0 lb

## 2020-04-24 DIAGNOSIS — Z23 Encounter for immunization: Secondary | ICD-10-CM | POA: Diagnosis not present

## 2020-04-24 DIAGNOSIS — Z0000 Encounter for general adult medical examination without abnormal findings: Secondary | ICD-10-CM | POA: Diagnosis not present

## 2020-04-24 DIAGNOSIS — Z125 Encounter for screening for malignant neoplasm of prostate: Secondary | ICD-10-CM | POA: Diagnosis not present

## 2020-04-24 NOTE — Progress Notes (Signed)
Subjective:    Patient ID: Brett [name], male    DOB: 01/23/1956, 64 y.o.   MRN: [id number]  HPI  Patient is a 64 yr old male who presents today for cpx. Patient presents today for complete physical.  Immunizations: tdap 2016, had Pfizer vaccine x 2, completed shingrix.  Due for flu shot and pneumovax Diet: tries to eat healthy Exercise:  Limited, plans to join silver sneakers Colonoscopy: due 11/9 Lab Results  Component Value Date   PSA 1.81 04/19/2019   PSA 1.72 04/13/2018   PSA 2.32 06/10/2016   Wt Readings from Last 3 Encounters:  04/24/20 157 lb (71.2 kg)  03/10/20 159 lb 12.8 oz (72.5 kg)  04/19/19 164 lb (74.4 kg)   Continues to smoke 10 cigarettes a day. Not ready to quit    Review of Systems  Constitutional: Negative for unexpected weight change.  HENT: Negative for hearing loss and rhinorrhea.   Eyes: Negative for visual disturbance.  Respiratory: Negative for cough.   Cardiovascular: Negative for chest pain.  Gastrointestinal: Negative for constipation and diarrhea.  Genitourinary: Negative for difficulty urinating, dysuria and frequency.  Musculoskeletal: Positive for arthralgias (low back pain/sciatica, chronic).  Skin: Negative for rash.  Neurological: Negative for headaches.  Hematological: Negative for adenopathy.  Psychiatric/Behavioral:       Denies depression/anxiety   Past Medical History:  Diagnosis Date  . Allergy   . Arthritis   . Blood transfusion without reported diagnosis   . Chicken pox   . Congenital hip deformity   . Glaucoma      Social History   Socioeconomic History  . Marital status: Widowed    Spouse name: Not on file  . Number of children: Not on file  . Years of education: Not on file  . Highest education level: Not on file  Occupational History  . Not on file  Tobacco Use  . Smoking status: Current Every Day Smoker    Packs/day: 0.50    Years: 46.00    Pack years: 23.00    Types: Cigarettes    Start  date: 60  . Smokeless tobacco: Never Used  . Tobacco comment: 0.5 ppd  Substance and Sexual Activity  . Alcohol use: Yes    Alcohol/week: 14.0 - 16.0 standard drinks    Types: 14 - 16 Cans of beer per week  . Drug use: No  . Sexual activity: Not on file  Other Topics Concern  . Not on file  Social History Narrative   Quality control Tech- gauges/callibrations.     Some colleg/tech school   wife passed   3 grown children (oldest daughter is living with them) youngest daughter lives in [place].  Curator at [employer].  Son lives near [place]- framing/art.   Social Determinants of Health   Financial Resource Strain:   . Difficulty of Paying Living Expenses: Not on file  Food Insecurity:   . Worried About Charity fundraiser in the Last Year: Not on file  . Ran Out of Food in the Last Year: Not on file  Transportation Needs:   . Lack of Transportation (Medical): Not on file  . Lack of Transportation (Non-Medical): Not on file  Physical Activity:   . Days of Exercise per Week: Not on file  . Minutes of Exercise per Session: Not on file  Stress:   . Feeling of Stress : Not on file  Social Connections:   . Frequency of Communication with Friends and Family:  Not on file  . Frequency of Social Gatherings with Friends and Family: Not on file  . Attends Religious Services: Not on file  . Active Member of Clubs or Organizations: Not on file  . Attends Archivist Meetings: Not on file  . Marital Status: Not on file  Intimate Partner Violence:   . Fear of Current or Ex-Partner: Not on file  . Emotionally Abused: Not on file  . Physically Abused: Not on file  . Sexually Abused: Not on file    Past Surgical History:  Procedure Laterality Date  . COLONOSCOPY    . HAMMER TOE SURGERY  07/28/2011   Procedure: HAMMER TOE CORRECTION;  Surgeon: Ninetta Lights, MD;  Location: [facility];  Service: Orthopedics;  Laterality: Right;  right 2nd and 4th toes  correction hammer toe, capsulotomy metatarsal-phalangeal joints  . [place] 2010   rt total hip-8/10-multiple rt hip surgeries- had 4 prior to 1986 as a child  . THUMB ARTHROSCOPY  2008   rt    Family History  Problem Relation Age of Onset  . Cancer Mother 31       history of colon cancer  . Rosacea Mother   . Colon cancer Mother   . Rosacea Father   . Lung disease Father        ?pulmonary fibrosis  . Cancer Cousin        colon  . Colon cancer Cousin   . Colon cancer Maternal Grandmother   . Alcohol abuse Brother   . Depression Brother   . Thyroid disease Son        ?hyperthyroid  . 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 Esophageal cancer Neg Hx   . Rectal cancer Neg Hx   . Stomach cancer Neg Hx     No Known Allergies  Current Outpatient Medications on File Prior to Visit  Medication Sig Dispense Refill  . acetaminophen (TYLENOL) 500 MG tablet Take 500-1,500 mg by mouth daily as needed.    . Ascorbic Acid (VITAMIN C) 500 MG CAPS Take 1 capsule by mouth daily.    [name] b complex vitamins capsule Take 1 capsule by mouth daily.    . fluticasone (FLONASE) 50 MCG/ACT nasal spray Place 2 sprays into both nostrils daily. 16 g 1  . Multiple Vitamin (MULTIVITAMIN) tablet Take 1 tablet by mouth daily.    . Omega-3 Fatty Acids (OMEGA-3 FISH OIL) 500 MG CAPS Take 1 capsule by mouth daily.    . TRAVATAN Z 0.004 % SOLN ophthalmic solution 1 drop each eye daily  1   No current facility-administered medications on file prior to visit.    BP 121/65 (BP Location: Left Arm, Patient Position: Sitting, Cuff Size: Small)   Pulse 98   Temp 97.6 F (36.4 C) (Temporal)   Resp 16   Ht 6\' 2"  (1.88 m)   Wt 157 lb (71.2 kg)   SpO2 99%   BMI 20.16 kg/m        Objective:   Physical Exam  Physical Exam  Constitutional: He is oriented to person, place, and time. He appears well-developed and well-nourished. No distress.  HENT:  Head: Normocephalic  and atraumatic.  Right Ear: Tympanic membrane and ear canal normal.  Left Ear: Tympanic membrane and ear canal normal.  Mouth/Throat: Not examined- pt wearing mask Eyes: Pupils are equal, round. No scleral icterus.  Neck: Normal range of  motion. No thyromegaly present.  Cardiovascular: Normal rate and regular rhythm.   No murmur heard. Pulmonary/Chest: Effort normal and breath sounds normal. No respiratory distress. He has no wheezes. He has no rales. He exhibits no tenderness.  Abdominal: Soft. Bowel sounds are normal. He exhibits no distension and no mass. There is no tenderness. There is no rebound and no guarding.  Musculoskeletal: He exhibits no edema.  Lymphadenopathy:    He has no cervical adenopathy.  Neurological: He is alert and oriented to person, place, and time. He has normal patellar reflexes. He exhibits normal muscle tone. Coordination normal.  Skin: Skin is warm and dry.  Psychiatric: He has a normal mood and affect. His behavior is normal. Judgment and thought content normal.           Assessment & Plan:   Preventative care- Flu shot and pneumovax today. Discussed healthy diet and exercise.  Obtain PSA. Refer for follow up [place]. Also,  He requested TSH and lipid panel and understands that these tests will not likely be covered by his insurance. Discussed smoking cessation. He is not currently ready to quit.   This visit occurred during the SARS-CoV-2 public health emergency.  Safety protocols were in place, including screening questions prior to the visit, additional usage of staff PPE, and extensive cleaning of exam room while observing appropriate contact time as indicated for disinfecting solutions.        Assessment & Plan:

## 2020-04-24 NOTE — Patient Instructions (Signed)

## 2020-04-25 LAB — PSA: PSA: 2.09 ng/mL (ref <=4.0)

## 2020-04-25 LAB — LIPID PANEL
Cholesterol: 178 mg/dL (ref <200)
HDL: 78 mg/dL (ref >=40)
LDL Cholesterol (Calc): 83 mg/dL (calc)
Non-HDL Cholesterol (Calc): 100 mg/dL (calc) (ref <130)
Total CHOL/HDL Ratio: 2.3 (calc) (ref <5.0)
Triglycerides: 79 mg/dL (ref <150)

## 2020-04-25 LAB — TSH: TSH: 1.81 mIU/L (ref 0.40–4.50)

## 2020-04-28 ENCOUNTER — Ambulatory Visit (HOSPITAL_BASED_OUTPATIENT_CLINIC_OR_DEPARTMENT_OTHER): Payer: Medicare HMO

## 2020-04-28 ENCOUNTER — Encounter (HOSPITAL_BASED_OUTPATIENT_CLINIC_OR_DEPARTMENT_OTHER): Payer: Self-pay

## 2020-04-28 DIAGNOSIS — M545 Low back pain: Secondary | ICD-10-CM | POA: Diagnosis not present

## 2020-04-29 ENCOUNTER — Other Ambulatory Visit: Payer: Self-pay

## 2020-04-29 ENCOUNTER — Ambulatory Visit (HOSPITAL_BASED_OUTPATIENT_CLINIC_OR_DEPARTMENT_OTHER)
Admission: RE | Admit: 2020-04-29 | Discharge: 2020-04-29 | Disposition: A | Discharge status: Home / Self Care | Payer: Medicare HMO | Source: Ambulatory Visit | Attending: Family | Admitting: Family

## 2020-04-29 DIAGNOSIS — F17210 Nicotine dependence, cigarettes, uncomplicated: Secondary | ICD-10-CM | POA: Diagnosis not present

## 2020-04-29 DIAGNOSIS — Z720 Tobacco use: Secondary | ICD-10-CM | POA: Diagnosis present

## 2020-04-29 DIAGNOSIS — R69 Illness, unspecified: Secondary | ICD-10-CM | POA: Diagnosis not present

## 2020-04-29 DIAGNOSIS — Z122 Encounter for screening for malignant neoplasm of respiratory organs: Secondary | ICD-10-CM | POA: Insufficient documentation

## 2020-05-05 DIAGNOSIS — M545 Low back pain: Secondary | ICD-10-CM | POA: Diagnosis not present

## 2020-05-05 DIAGNOSIS — M5136 Other intervertebral disc degeneration, lumbar region: Secondary | ICD-10-CM | POA: Diagnosis not present

## 2020-05-05 DIAGNOSIS — M4310 Spondylolisthesis, site unspecified: Secondary | ICD-10-CM

## 2020-05-05 DIAGNOSIS — M419 Scoliosis, unspecified: Secondary | ICD-10-CM | POA: Insufficient documentation

## 2020-05-05 HISTORY — DX: Scoliosis, unspecified: M41.9

## 2020-05-05 HISTORY — DX: Spondylolisthesis, site unspecified: M43.10

## 2020-05-12 ENCOUNTER — Ambulatory Visit: Payer: Medicare HMO | Attending: Internal Medicine

## 2020-05-12 DIAGNOSIS — Z23 Encounter for immunization: Secondary | ICD-10-CM

## 2020-05-12 NOTE — Progress Notes (Signed)
   Covid-19 Vaccination Clinic  Name:  Brett [name]    MRN: [id number] DOB: 08-30-1955  05/12/2020  [name] was observed post Covid-19 immunization for 15 minutes without incident. He was provided with Vaccine Information Sheet and instruction to access the V-Safe system.   [name] was instructed to call 911 with any severe reactions post vaccine: [name] Difficulty breathing  . Swelling of face and throat  . A fast heartbeat  . A bad rash all over body  . Dizziness and weakness

## 2020-05-14 ENCOUNTER — Encounter: Payer: Self-pay | Admitting: Internal Medicine

## 2020-05-20 DIAGNOSIS — M4317 Spondylolisthesis, lumbosacral region: Secondary | ICD-10-CM | POA: Diagnosis not present

## 2020-06-01 DIAGNOSIS — M5416 Radiculopathy, lumbar region: Secondary | ICD-10-CM | POA: Diagnosis not present

## 2020-06-01 DIAGNOSIS — M48062 Spinal stenosis, lumbar region with neurogenic claudication: Secondary | ICD-10-CM | POA: Diagnosis not present

## 2020-06-01 DIAGNOSIS — M4317 Spondylolisthesis, lumbosacral region: Secondary | ICD-10-CM | POA: Diagnosis not present

## 2020-06-08 DIAGNOSIS — H401133 Primary open-angle glaucoma, bilateral, severe stage: Secondary | ICD-10-CM | POA: Diagnosis not present

## 2020-06-17 DIAGNOSIS — M48062 Spinal stenosis, lumbar region with neurogenic claudication: Secondary | ICD-10-CM | POA: Diagnosis not present

## 2020-06-17 DIAGNOSIS — M5416 Radiculopathy, lumbar region: Secondary | ICD-10-CM | POA: Diagnosis not present

## 2020-07-06 ENCOUNTER — Other Ambulatory Visit: Payer: Self-pay

## 2020-07-06 ENCOUNTER — Encounter: Payer: Self-pay | Admitting: Internal Medicine

## 2020-07-06 ENCOUNTER — Ambulatory Visit (AMBULATORY_SURGERY_CENTER): Payer: Self-pay | Admitting: *Deleted

## 2020-07-06 VITALS — Ht 74.0 in | Wt 161.0 lb

## 2020-07-06 DIAGNOSIS — Z800 Family history of malignant neoplasm of digestive organs: Secondary | ICD-10-CM

## 2020-07-06 DIAGNOSIS — Z86010 Personal history of colonic polyps: Secondary | ICD-10-CM

## 2020-07-06 MED ORDER — SUTAB 1479-225-188 MG PO TABS: 1.0000 | ORAL_TABLET | ORAL | 0 refills | Status: DC

## 2020-07-06 NOTE — Progress Notes (Signed)
Patient is here in-person for PV. Patient denies any allergies to eggs or soy. Patient denies any problems with anesthesia/sedation. Patient denies any oxygen use at home. Patient denies taking any diet/weight loss medications or blood thinners. Patient is not being treated for MRSA or C-diff. Patient is aware of our care-partner policy and ZBFM[facility]-04 safety protocol. EMMI education assigned to the patient for the procedure, sent to [facility].   COVID-19 vaccines completed on 05/12/20 booster, per patient.   Prep Prescription coupon given to the patient.

## 2020-07-15 DIAGNOSIS — M5416 Radiculopathy, lumbar region: Secondary | ICD-10-CM | POA: Diagnosis not present

## 2020-07-15 DIAGNOSIS — M48062 Spinal stenosis, lumbar region with neurogenic claudication: Secondary | ICD-10-CM | POA: Diagnosis not present

## 2020-07-15 DIAGNOSIS — R030 Elevated blood-pressure reading, without diagnosis of hypertension: Secondary | ICD-10-CM | POA: Diagnosis not present

## 2020-07-20 ENCOUNTER — Ambulatory Visit (AMBULATORY_SURGERY_CENTER): Payer: Medicare HMO | Admitting: Internal Medicine

## 2020-07-20 ENCOUNTER — Other Ambulatory Visit: Payer: Self-pay

## 2020-07-20 ENCOUNTER — Encounter: Payer: Self-pay | Admitting: Internal Medicine

## 2020-07-20 VITALS — BP 134/71 | HR 69 | Temp 97.7°F | Resp 18 | Ht 74.0 in | Wt 161.0 lb

## 2020-07-20 DIAGNOSIS — D120 Benign neoplasm of cecum: Secondary | ICD-10-CM | POA: Diagnosis not present

## 2020-07-20 DIAGNOSIS — Z800 Family history of malignant neoplasm of digestive organs: Secondary | ICD-10-CM | POA: Diagnosis not present

## 2020-07-20 DIAGNOSIS — D122 Benign neoplasm of ascending colon: Secondary | ICD-10-CM | POA: Diagnosis not present

## 2020-07-20 DIAGNOSIS — Z86010 Personal history of colonic polyps: Secondary | ICD-10-CM

## 2020-07-20 HISTORY — PX: COLONOSCOPY: SHX174

## 2020-07-20 MED ORDER — SODIUM CHLORIDE 0.9 % IV SOLN: 500.0000 mL | Freq: Once | INTRAVENOUS | Status: DC

## 2020-07-20 NOTE — Progress Notes (Signed)
Pt's states no medical or surgical changes since previsit or office visit.  VS CW  

## 2020-07-20 NOTE — Op Note (Signed)
[facility] Patient Name: Brett [name] Procedure Date: 07/20/2020 10:39 AM MRN: [id number] Endoscopist: Jerene Bears , MD Age: 64 Referring MD:  Date of Birth: Feb 11, 1956 Gender: Male Account #: [id number] Procedure:                Colonoscopy Indications:              High risk colon cancer surveillance: Personal                            history of non-advanced adenoma, Family history of                            colon cancer in a first-degree relative, Last                            colonoscopy: November 2016 Medicines:                Monitored Anesthesia Care Procedure:                Pre-Anesthesia Assessment:                           - Prior to the procedure, a History and Physical                            was performed, and patient medications and                            allergies were reviewed. The patient's tolerance of                            previous anesthesia was also reviewed. The risks                            and benefits of the procedure and the sedation                            options and risks were discussed with the patient.                            All questions were answered, and informed consent                            was obtained. Prior Anticoagulants: The patient has                            taken no previous anticoagulant or antiplatelet                            agents. ASA Grade Assessment: II - A patient with                            mild systemic disease. After reviewing the risks  and benefits, the patient was deemed in                            satisfactory condition to undergo the procedure.                           After obtaining informed consent, the colonoscope                            was passed under direct vision. Throughout the                            procedure, the patient's blood pressure, pulse, and                            oxygen saturations were monitored  continuously. The                            adult colonoscope was introduced through the anus                            and advanced to the cecum, identified by                            appendiceal orifice and ileocecal valve. The                            colonoscopy was performed without difficulty. The                            patient tolerated the procedure well. The quality                            of the bowel preparation was good. The ileocecal                            valve, appendiceal orifice, and rectum were                            photographed. Scope In: 10:48:51 AM Scope Out: 11:23:46 AM Scope Withdrawal Time: 0 hours 31 minutes 39 seconds  Total Procedure Duration: 0 hours 34 minutes 55 seconds  Findings:                 The digital rectal exam was normal.                           Three sessile polyps were found in the cecum. The                            polyps were 4 to 6 mm in size. These polyps were                            removed with a cold snare. Resection and retrieval  were complete.                           A 15 mm polyp was found in the proximal ascending                            colon. The polyp was sessile and located just                            distal to the ileocecal valve in the ascending                            colon. The polyp extended near a diverticulum. The                            polyp was removed with a piecemeal technique using                            a cold snare. Resection and retrieval were                            complete. Area immediately beside the polypectomy                            site was tattooed with an injection of 2.5 mL of                            Spot (carbon black).                           Two sessile polyps were found in the ascending                            colon. The polyps were 3 to 5 mm in size. These                            polyps were removed with a  cold snare. Resection                            and retrieval were complete.                           Multiple small and large-mouthed diverticula were                            found in the sigmoid colon, descending colon and                            ascending colon.                           Internal hemorrhoids were found during  retroflexion. The hemorrhoids were small. Complications:            No immediate complications. Estimated Blood Loss:     Estimated blood loss was minimal. Impression:               - Three 4 to 6 mm polyps in the cecum, removed with                            a cold snare. Resected and retrieved.                           - One 15 mm polyp in the proximal ascending colon,                            removed piecemeal using a cold snare. Resected and                            retrieved. Tattooed.                           - Two 3 to 5 mm polyps in the ascending colon,                            removed with a cold snare. Resected and retrieved.                           - Diverticulosis in the sigmoid colon, in the                            descending colon and in the ascending colon.                           - Internal hemorrhoids. Recommendation:           - Patient has a contact number available for                            emergencies. The signs and symptoms of potential                            delayed complications were discussed with the                            patient. Return to normal activities tomorrow.                            Written discharge instructions were provided to the                            patient.                           - Resume previous diet.                           - Continue  present medications.                           - Await pathology results.                           - Repeat colonoscopy in 6 months for surveillance                            after piecemeal polypectomy. Jerene Bears, MD 07/20/2020 11:29:30 AM This report has been signed electronically.

## 2020-07-20 NOTE — Patient Instructions (Signed)
Please read handouts provided. Continue present medications. Await pathology results. Repeat colonoscopy in 6 months for surveillance.      YOU HAD AN ENDOSCOPIC PROCEDURE TODAY AT [facility]:   Refer to the procedure report that was given to you for any specific questions about what was found during the examination.  If the procedure report does not answer your questions, please call your gastroenterologist to clarify.  If you requested that your care partner not be given the details of your procedure findings, then the procedure report has been included in a sealed envelope for you to review at your convenience later.  YOU SHOULD EXPECT: Some feelings of bloating in the abdomen. Passage of more gas than usual.  Walking can help get rid of the air that was put into your GI tract during the procedure and reduce the bloating. If you had a lower endoscopy (such as a colonoscopy or flexible sigmoidoscopy) you may notice spotting of blood in your stool or on the toilet paper. If you underwent a bowel prep for your procedure, you may not have a normal bowel movement for a few days.  Please Note:  You might notice some irritation and congestion in your nose or some drainage.  This is from the oxygen used during your procedure.  There is no need for concern and it should clear up in a day or so.  SYMPTOMS TO REPORT IMMEDIATELY:   Following lower endoscopy (colonoscopy or flexible sigmoidoscopy):  Excessive amounts of blood in the stool  Significant tenderness or worsening of abdominal pains  Swelling of the abdomen that is new, acute  Fever of 100F or higher   For urgent or emergent issues, a gastroenterologist can be reached at any hour by calling [phone]. Do not use MyChart messaging for urgent concerns.    DIET:  We do recommend a small meal at first, but then you may proceed to your regular diet.  Drink plenty of fluids but you should avoid alcoholic beverages for  24 hours.  ACTIVITY:  You should plan to take it easy for the rest of today and you should NOT DRIVE or use heavy machinery until tomorrow (because of the sedation medicines used during the test).    FOLLOW UP: Our staff will call the number listed on your records 48-72 hours following your procedure to check on you and address any questions or concerns that you may have regarding the information given to you following your procedure. If we do not reach you, we will leave a message.  We will attempt to reach you two times.  During this call, we will ask if you have developed any symptoms of COVID 19. If you develop any symptoms (ie: fever, flu-like symptoms, shortness of breath, cough etc.) before then, please call [phone].  If you test positive for Covid 19 in the 2 weeks post procedure, please call and report this information to [name].    If any biopsies were taken you will be contacted by phone or by letter within the next 1-3 weeks.  Please call us at [phone] if you have not heard about the biopsies in 3 weeks.    SIGNATURES/CONFIDENTIALITY: You and/or your care partner have signed paperwork which will be entered into your electronic medical record.  These signatures attest to the fact that that the information above on your After Visit Summary has been reviewed and is understood.  Full responsibility of the confidentiality of this discharge information lies with  you and/or your care-partner.

## 2020-07-20 NOTE — Progress Notes (Signed)
pt tolerated well. VSS. awake and to recovery. Report given to RN.  

## 2020-07-20 NOTE — Progress Notes (Signed)
Called to room to assist during endoscopic procedure.  Patient ID and intended procedure confirmed with present staff. Received instructions for my participation in the procedure from the performing physician.  

## 2020-07-22 ENCOUNTER — Telehealth: Payer: Self-pay | Admitting: *Deleted

## 2020-07-22 NOTE — Telephone Encounter (Signed)
°  Follow up Call-  Call back number 07/20/2020  Post procedure Call Back phone  # [phone]  Permission to leave phone message Yes  Some recent data might be hidden     Patient questions:  Do you have a fever, pain , or abdominal swelling? No. Pain Score  0 *  Have you tolerated food without any problems? Yes.    Have you been able to return to your normal activities? Yes.    Do you have any questions about your discharge instructions: Diet   No. Medications  No. Follow up visit  No.  Do you have questions or concerns about your Care? No.  Actions: * If pain score is 4 or above: No action needed, pain <4  1. Have you developed a fever since your procedure? NO  2.   Have you had an respiratory symptoms (SOB or cough) since your procedure? NO  3.   Have you tested positive for COVID 19 since your procedure NO  4.   Have you had any family members/close contacts diagnosed with the COVID 19 since your procedure?  NO   If yes to any of these questions please route to Joylene John, RN and Joella Prince, RN

## 2020-07-23 ENCOUNTER — Encounter: Payer: Self-pay | Admitting: Internal Medicine

## 2020-08-18 DIAGNOSIS — M4317 Spondylolisthesis, lumbosacral region: Secondary | ICD-10-CM | POA: Diagnosis not present

## 2020-08-25 ENCOUNTER — Other Ambulatory Visit: Payer: Self-pay | Admitting: Neurosurgery

## 2020-09-04 DIAGNOSIS — M4316 Spondylolisthesis, lumbar region: Secondary | ICD-10-CM | POA: Diagnosis not present

## 2020-09-08 HISTORY — PX: OTHER SURGICAL HISTORY: SHX169

## 2020-09-09 NOTE — Progress Notes (Addendum)
[place], [address] [place] [address] Phone: [phone] Fax: [phone]  [id number] [address], [place] [address] Phone: [phone] Fax: [place], [address] [place] [address] Phone: [phone] Fax: [phone]      Your procedure is scheduled on February 7  Report to [facility] Main Entrance "A" at 0600 A.M., and check in at the Admitting office.  Call this number if you have problems the morning of surgery:  [phone]  Call [phone] if you have any questions prior to your surgery date Monday-Friday 8am-4pm    Remember:  Do not eat or drink after midnight the night before your surgery    Take these medicines the morning of surgery with A SIP OF WATER  fluticasone (FLONASE)  Eye drops if needed   As of today, STOP taking any Aspirin (unless otherwise instructed by your surgeon) Aleve, Naproxen, Ibuprofen, Motrin, Advil, Goody's, BC's, all herbal medications, fish oil, and all vitamins.                      Do not wear jewelry            Do not wear lotions, powders, colognes, or deodorant.            Men may shave face and neck.            Do not bring valuables to the hospital.            [facility] is not responsible for any belongings or valuables.  Do NOT Smoke (Tobacco/Vaping) or drink Alcohol 24 hours prior to your procedure If you use a CPAP at night, you may bring all equipment for your overnight stay.   Contacts, glasses, dentures or bridgework may not be worn into surgery.      For patients admitted to the hospital, discharge time will be determined by your treatment team.   Patients discharged the day of surgery will not be allowed to drive home, and someone needs to stay with them for 24 hours.    Special instructions:    [facility]- Preparing For Surgery  Before surgery, you can play an important role. Because skin is not sterile, your skin needs to be as free of germs as possible. You can reduce the number of germs on your skin by washing with CHG (chlorahexidine gluconate) Soap before surgery.  CHG is an antiseptic cleaner which kills germs and bonds with the skin to continue killing germs even after washing.    Oral Hygiene is also important to reduce your risk of infection.  Remember - BRUSH YOUR TEETH THE MORNING OF SURGERY WITH YOUR REGULAR TOOTHPASTE  Please do not use if you have an allergy to CHG or antibacterial soaps. If your skin becomes reddened/irritated stop using the CHG.  Do not shave (including legs and underarms) for at least 48 hours prior to first CHG shower. It is OK to shave your face.  Please follow these instructions carefully.   1. Shower the NIGHT BEFORE SURGERY and the MORNING OF SURGERY with CHG Soap.   2. If you chose to wash your hair, wash your hair first as usual with your normal shampoo.  3. After you shampoo, rinse your hair and body thoroughly to remove the  shampoo.  4. Use CHG as you would any other liquid soap. You can apply CHG directly to the skin and wash gently with a scrungie or a clean washcloth.   5. Apply the CHG Soap to your body ONLY FROM THE NECK DOWN.  Do not use on open wounds or open sores. Avoid contact with your eyes, ears, mouth and genitals (private parts). Wash Face and genitals (private parts)  with your normal soap.   6. Wash thoroughly, paying special attention to the area where your surgery will be performed.  7. Thoroughly rinse your body with warm water from the neck down.  8. DO NOT shower/wash with your normal soap after using and rinsing off the CHG Soap.  9. Pat yourself dry with a CLEAN TOWEL.  10. Wear CLEAN PAJAMAS to bed the night before surgery  11. Place CLEAN SHEETS on your bed the night of your first shower and DO NOT SLEEP  WITH PETS.   Day of Surgery: Wear Clean/Comfortable clothing the morning of surgery Do not apply any deodorants/lotions.   Remember to brush your teeth WITH YOUR REGULAR TOOTHPASTE.   Please read over the following fact sheets that you were given.

## 2020-09-10 ENCOUNTER — Encounter (HOSPITAL_COMMUNITY)
Admission: RE | Admit: 2020-09-10 | Discharge: 2020-09-10 | Disposition: A | Discharge status: Home / Self Care | Payer: Medicare HMO | Source: Ambulatory Visit | Attending: Neurosurgery | Admitting: Neurosurgery

## 2020-09-10 ENCOUNTER — Other Ambulatory Visit (HOSPITAL_COMMUNITY)
Admission: RE | Admit: 2020-09-10 | Discharge: 2020-09-10 | Disposition: A | Discharge status: Home / Self Care | Payer: Medicare HMO | Source: Ambulatory Visit | Attending: Neurosurgery | Admitting: Neurosurgery

## 2020-09-10 ENCOUNTER — Encounter (HOSPITAL_COMMUNITY): Payer: Self-pay

## 2020-09-10 ENCOUNTER — Other Ambulatory Visit: Payer: Self-pay

## 2020-09-10 DIAGNOSIS — Z20822 Contact with and (suspected) exposure to covid-19: Secondary | ICD-10-CM | POA: Insufficient documentation

## 2020-09-10 DIAGNOSIS — Z01812 Encounter for preprocedural laboratory examination: Secondary | ICD-10-CM | POA: Insufficient documentation

## 2020-09-10 LAB — COMPREHENSIVE METABOLIC PANEL
ALT: 18 U/L (ref 0–44)
AST: 22 U/L (ref 15–41)
Albumin: 3.6 g/dL (ref 3.5–5.0)
Alkaline Phosphatase: 41 U/L (ref 38–126)
Anion gap: 7 (ref 5–15)
BUN: 7 mg/dL — ABNORMAL LOW (ref 8–23)
CO2: 26 mmol/L (ref 22–32)
Calcium: 9.1 mg/dL (ref 8.9–10.3)
Chloride: 106 mmol/L (ref 98–111)
Creatinine, Ser: 0.88 mg/dL (ref 0.61–1.24)
GFR, Estimated: >60 mL/min (ref >60)
Glucose, Bld: 81 mg/dL (ref 70–99)
Potassium: 4.2 mmol/L (ref 3.5–5.1)
Sodium: 139 mmol/L (ref 135–145)
Total Bilirubin: 0.9 mg/dL (ref 0.3–1.2)
Total Protein: 6.5 g/dL (ref 6.5–8.1)

## 2020-09-10 LAB — CBC WITH DIFFERENTIAL/PLATELET
Abs Immature Granulocytes: 0.01 10*3/uL (ref 0.00–0.07)
Basophils Absolute: 0.1 10*3/uL (ref 0.0–0.1)
Basophils Relative: 2 %
Eosinophils Absolute: 0.2 10*3/uL (ref 0.0–0.5)
Eosinophils Relative: 3 %
HCT: 44.4 % (ref 39.0–52.0)
Hemoglobin: 15.6 g/dL (ref 13.0–17.0)
Immature Granulocytes: 0 %
Lymphocytes Relative: 24 %
Lymphs Abs: 1.7 10*3/uL (ref 0.7–4.0)
MCH: 34.7 pg — ABNORMAL HIGH (ref 26.0–34.0)
MCHC: 35.1 g/dL (ref 30.0–36.0)
MCV: 98.7 fL (ref 80.0–100.0)
Monocytes Absolute: 0.8 10*3/uL (ref 0.1–1.0)
Monocytes Relative: 12 %
Neutro Abs: 4.3 10*3/uL (ref 1.7–7.7)
Neutrophils Relative %: 59 %
Platelets: 208 10*3/uL (ref 150–400)
RBC: 4.5 MIL/uL (ref 4.22–5.81)
RDW: 12.9 % (ref 11.5–15.5)
WBC: 7.2 10*3/uL (ref 4.0–10.5)
nRBC: 0 % (ref 0.0–0.2)

## 2020-09-10 LAB — SURGICAL PCR SCREEN
MRSA, PCR: NEGATIVE
Staphylococcus aureus: NEGATIVE

## 2020-09-10 LAB — TYPE AND SCREEN
ABO/RH(D): B POS
Antibody Screen: NEGATIVE

## 2020-09-10 LAB — SARS CORONAVIRUS 2 (TAT 6-24 HRS): SARS Coronavirus 2: NEGATIVE

## 2020-09-10 NOTE — Progress Notes (Signed)
PCP - Debbrah Alar Cardiologist - denies  Chest x-ray - not needed EKG - not needed Stress Test - denies ECHO - denies Cardiac Cath - denies  COVID TEST- 09/10/20, quarantine instructions explained     Anesthesia review: NO  Patient denies shortness of breath, fever, cough and chest pain at PAT appointment   All instructions explained to the patient, with a verbal understanding of the material. Patient agrees to go over the instructions while at home for a better understanding. Patient also instructed to self quarantine after being tested for COVID-19. The opportunity to ask questions was provided.

## 2020-09-13 NOTE — Anesthesia Preprocedure Evaluation (Addendum)
Anesthesia Evaluation  Patient identified by MRN, date of birth, ID band Patient awake    Reviewed: Allergy & Precautions, NPO status , Patient's Chart, lab work & pertinent test results  History of Anesthesia Complications Negative for: history of anesthetic complications  Airway Mallampati: I  TM Distance: >3 FB Neck ROM: Full    Dental no notable dental hx.    Pulmonary Current Smoker,    Pulmonary exam normal        Cardiovascular negative cardio ROS Normal cardiovascular exam     Neuro/Psych Spondylolisthesis negative psych ROS   GI/Hepatic negative GI ROS, Neg liver ROS,   Endo/Other  negative endocrine ROS  Renal/GU negative Renal ROS  negative genitourinary   Musculoskeletal  (+) Arthritis ,   Abdominal   Peds  Hematology negative hematology ROS (+)   Anesthesia Other Findings Day of surgery medications reviewed with patient.  Reproductive/Obstetrics negative OB ROS                            Anesthesia Physical Anesthesia Plan  ASA: II  Anesthesia Plan: General   Post-op Pain Management:    Induction: Intravenous  PONV Risk Score and Plan: 3 and Treatment may vary due to age or medical condition, Ondansetron, Dexamethasone and Midazolam  Airway Management Planned: Oral ETT  Additional Equipment: None  Intra-op Plan:   Post-operative Plan: Extubation in OR  Informed Consent: I have reviewed the patients History and Physical, chart, labs and discussed the procedure including the risks, benefits and alternatives for the proposed anesthesia with the patient or authorized representative who has indicated his/her understanding and acceptance.     Dental advisory given  Plan Discussed with: CRNA  Anesthesia Plan Comments:        Anesthesia Quick Evaluation

## 2020-09-14 ENCOUNTER — Inpatient Hospital Stay (HOSPITAL_COMMUNITY): Payer: Medicare HMO

## 2020-09-14 ENCOUNTER — Encounter (HOSPITAL_COMMUNITY): Payer: Self-pay | Admitting: Neurosurgery

## 2020-09-14 ENCOUNTER — Inpatient Hospital Stay (HOSPITAL_COMMUNITY)
Admission: AD | Admit: 2020-09-14 | Discharge: 2020-09-15 | DRG: 455 | Disposition: A | Discharge status: Home / Self Care | Payer: Medicare HMO | Attending: Neurosurgery | Admitting: Neurosurgery

## 2020-09-14 ENCOUNTER — Inpatient Hospital Stay (HOSPITAL_COMMUNITY): Payer: Medicare HMO | Admitting: Anesthesiology

## 2020-09-14 ENCOUNTER — Encounter (HOSPITAL_COMMUNITY)
Admission: AD | Disposition: A | Discharge status: Home / Self Care | Payer: Self-pay | Source: Home / Self Care | Attending: Neurosurgery

## 2020-09-14 ENCOUNTER — Other Ambulatory Visit: Payer: Self-pay

## 2020-09-14 DIAGNOSIS — M4856XA Collapsed vertebra, not elsewhere classified, lumbar region, initial encounter for fracture: Secondary | ICD-10-CM | POA: Diagnosis not present

## 2020-09-14 DIAGNOSIS — R69 Illness, unspecified: Secondary | ICD-10-CM | POA: Diagnosis not present

## 2020-09-14 DIAGNOSIS — Z419 Encounter for procedure for purposes other than remedying health state, unspecified: Secondary | ICD-10-CM

## 2020-09-14 DIAGNOSIS — M4186 Other forms of scoliosis, lumbar region: Secondary | ICD-10-CM | POA: Diagnosis not present

## 2020-09-14 DIAGNOSIS — M438X6 Other specified deforming dorsopathies, lumbar region: Secondary | ICD-10-CM | POA: Diagnosis not present

## 2020-09-14 DIAGNOSIS — M48061 Spinal stenosis, lumbar region without neurogenic claudication: Secondary | ICD-10-CM | POA: Diagnosis not present

## 2020-09-14 DIAGNOSIS — M4317 Spondylolisthesis, lumbosacral region: Secondary | ICD-10-CM | POA: Diagnosis not present

## 2020-09-14 DIAGNOSIS — M5116 Intervertebral disc disorders with radiculopathy, lumbar region: Secondary | ICD-10-CM | POA: Diagnosis not present

## 2020-09-14 DIAGNOSIS — M9983 Other biomechanical lesions of lumbar region: Secondary | ICD-10-CM | POA: Diagnosis not present

## 2020-09-14 DIAGNOSIS — F17210 Nicotine dependence, cigarettes, uncomplicated: Secondary | ICD-10-CM | POA: Diagnosis present

## 2020-09-14 DIAGNOSIS — Z4889 Encounter for other specified surgical aftercare: Secondary | ICD-10-CM | POA: Diagnosis not present

## 2020-09-14 DIAGNOSIS — M5417 Radiculopathy, lumbosacral region: Secondary | ICD-10-CM | POA: Diagnosis not present

## 2020-09-14 DIAGNOSIS — Z981 Arthrodesis status: Secondary | ICD-10-CM | POA: Diagnosis not present

## 2020-09-14 DIAGNOSIS — J309 Allergic rhinitis, unspecified: Secondary | ICD-10-CM | POA: Diagnosis not present

## 2020-09-14 DIAGNOSIS — M4807 Spinal stenosis, lumbosacral region: Secondary | ICD-10-CM | POA: Diagnosis present

## 2020-09-14 DIAGNOSIS — M4316 Spondylolisthesis, lumbar region: Secondary | ICD-10-CM | POA: Diagnosis present

## 2020-09-14 DIAGNOSIS — M4800 Spinal stenosis, site unspecified: Secondary | ICD-10-CM | POA: Diagnosis not present

## 2020-09-14 HISTORY — DX: Spondylolisthesis, lumbosacral region: M43.17

## 2020-09-14 SURGERY — POSTERIOR LUMBAR FUSION 2 LEVEL
Anesthesia: General | Site: Back

## 2020-09-14 MED ORDER — ORAL CARE MOUTH RINSE: 15.0000 mL | Freq: Once | OROMUCOSAL | Status: AC

## 2020-09-14 MED ORDER — ACETAMINOPHEN 500 MG PO TABS
1000.0000 mg | ORAL_TABLET | Freq: Once | ORAL | Status: AC
  Administered 2020-09-14: 1000 mg via ORAL
  Filled 2020-09-14: qty 2

## 2020-09-14 MED ORDER — ACETAMINOPHEN 325 MG PO TABS
650.0000 mg | ORAL_TABLET | ORAL | Status: DC | PRN
  Administered 2020-09-14 – 2020-09-15 (×2): 650 mg via ORAL
  Filled 2020-09-14 (×2): qty 2

## 2020-09-14 MED ORDER — VANCOMYCIN HCL 1000 MG IV SOLR
INTRAVENOUS | Status: DC | PRN
  Administered 2020-09-14: 1000 mg

## 2020-09-14 MED ORDER — IBUPROFEN 200 MG PO TABS: 400.0000 mg | ORAL_TABLET | Freq: Four times a day (QID) | ORAL | Status: DC | PRN

## 2020-09-14 MED ORDER — SUGAMMADEX SODIUM 200 MG/2ML IV SOLN
INTRAVENOUS | Status: DC | PRN
  Administered 2020-09-14: 150 mg via INTRAVENOUS

## 2020-09-14 MED ORDER — OXYCODONE HCL 5 MG/5ML PO SOLN: 5.0000 mg | Freq: Once | ORAL | Status: DC | PRN

## 2020-09-14 MED ORDER — POLYETHYLENE GLYCOL 3350 17 G PO PACK: 17.0000 g | PACK | Freq: Every day | ORAL | Status: DC | PRN

## 2020-09-14 MED ORDER — CEFAZOLIN SODIUM-DEXTROSE 2-4 GM/100ML-% IV SOLN
2.0000 g | INTRAVENOUS | Status: AC
  Administered 2020-09-14: 2 g via INTRAVENOUS
  Filled 2020-09-14: qty 100

## 2020-09-14 MED ORDER — MIDAZOLAM HCL 2 MG/2ML IJ SOLN
INTRAMUSCULAR | Status: AC
  Filled 2020-09-14: qty 2

## 2020-09-14 MED ORDER — PROMETHAZINE HCL 25 MG/ML IJ SOLN: 6.2500 mg | INTRAMUSCULAR | Status: DC | PRN

## 2020-09-14 MED ORDER — DEXAMETHASONE SODIUM PHOSPHATE 10 MG/ML IJ SOLN
10.0000 mg | Freq: Once | INTRAMUSCULAR | Status: AC
  Administered 2020-09-14: 10 mg via INTRAVENOUS
  Filled 2020-09-14: qty 1

## 2020-09-14 MED ORDER — CEFAZOLIN SODIUM-DEXTROSE 1-4 GM/50ML-% IV SOLN
1.0000 g | Freq: Three times a day (TID) | INTRAVENOUS | Status: AC
Start: 2020-09-14 — End: 2020-09-15
  Administered 2020-09-14 (×2): 1 g via INTRAVENOUS
  Filled 2020-09-14 (×2): qty 50

## 2020-09-14 MED ORDER — SODIUM CHLORIDE 0.9 % IV SOLN
250.0000 mL | INTRAVENOUS | Status: DC
  Administered 2020-09-14: 250 mL via INTRAVENOUS

## 2020-09-14 MED ORDER — FLUTICASONE PROPIONATE 50 MCG/ACT NA SUSP
2.0000 | Freq: Every day | NASAL | Status: DC
  Filled 2020-09-14: qty 16

## 2020-09-14 MED ORDER — ONDANSETRON HCL 4 MG/2ML IJ SOLN
INTRAMUSCULAR | Status: AC
  Filled 2020-09-14: qty 2

## 2020-09-14 MED ORDER — THROMBIN 20000 UNITS EX SOLR
CUTANEOUS | Status: AC
  Filled 2020-09-14: qty 20000

## 2020-09-14 MED ORDER — SODIUM CHLORIDE 0.9% FLUSH: 3.0000 mL | INTRAVENOUS | Status: DC | PRN

## 2020-09-14 MED ORDER — HYDROCODONE-ACETAMINOPHEN 10-325 MG PO TABS: 1.0000 | ORAL_TABLET | ORAL | Status: DC | PRN

## 2020-09-14 MED ORDER — PROPOFOL 10 MG/ML IV BOLUS
INTRAVENOUS | Status: AC
  Filled 2020-09-14: qty 40

## 2020-09-14 MED ORDER — ONDANSETRON HCL 4 MG/2ML IJ SOLN
INTRAMUSCULAR | Status: DC | PRN
  Administered 2020-09-14: 4 mg via INTRAVENOUS

## 2020-09-14 MED ORDER — OXYCODONE HCL 5 MG PO TABS: 5.0000 mg | ORAL_TABLET | Freq: Once | ORAL | Status: DC | PRN

## 2020-09-14 MED ORDER — DEXAMETHASONE SODIUM PHOSPHATE 10 MG/ML IJ SOLN
INTRAMUSCULAR | Status: AC
  Filled 2020-09-14: qty 1

## 2020-09-14 MED ORDER — BUPIVACAINE HCL (PF) 0.25 % IJ SOLN
INTRAMUSCULAR | Status: AC
  Filled 2020-09-14: qty 30

## 2020-09-14 MED ORDER — ONDANSETRON HCL 4 MG PO TABS: 4.0000 mg | ORAL_TABLET | Freq: Four times a day (QID) | ORAL | Status: DC | PRN

## 2020-09-14 MED ORDER — FENTANYL CITRATE (PF) 100 MCG/2ML IJ SOLN
INTRAMUSCULAR | Status: AC
  Filled 2020-09-14: qty 2

## 2020-09-14 MED ORDER — FENTANYL CITRATE (PF) 250 MCG/5ML IJ SOLN
INTRAMUSCULAR | Status: DC | PRN
  Administered 2020-09-14: 50 ug via INTRAVENOUS
  Administered 2020-09-14: 100 ug via INTRAVENOUS
  Administered 2020-09-14: 50 ug via INTRAVENOUS
  Administered 2020-09-14: 100 ug via INTRAVENOUS
  Administered 2020-09-14: 50 ug via INTRAVENOUS
  Administered 2020-09-14: 100 ug via INTRAVENOUS
  Administered 2020-09-14: 50 ug via INTRAVENOUS

## 2020-09-14 MED ORDER — VANCOMYCIN HCL 1000 MG IV SOLR
INTRAVENOUS | Status: AC
  Filled 2020-09-14: qty 1000

## 2020-09-14 MED ORDER — OMEGA-3-ACID ETHYL ESTERS 1 G PO CAPS
1.0000 g | ORAL_CAPSULE | Freq: Every day | ORAL | Status: DC
  Administered 2020-09-15: 1 g via ORAL
  Filled 2020-09-14: qty 1

## 2020-09-14 MED ORDER — FENTANYL CITRATE (PF) 100 MCG/2ML IJ SOLN: 25.0000 ug | INTRAMUSCULAR | Status: DC | PRN

## 2020-09-14 MED ORDER — FENTANYL CITRATE (PF) 250 MCG/5ML IJ SOLN
INTRAMUSCULAR | Status: AC
  Filled 2020-09-14: qty 5

## 2020-09-14 MED ORDER — LATANOPROST 0.005 % OP SOLN
1.0000 [drp] | Freq: Every day | OPHTHALMIC | Status: DC
  Administered 2020-09-14: 1 [drp] via OPHTHALMIC
  Filled 2020-09-14: qty 2.5

## 2020-09-14 MED ORDER — BUPIVACAINE HCL (PF) 0.25 % IJ SOLN
INTRAMUSCULAR | Status: DC | PRN
  Administered 2020-09-14: 26 mL

## 2020-09-14 MED ORDER — 0.9 % SODIUM CHLORIDE (POUR BTL) OPTIME
TOPICAL | Status: DC | PRN
  Administered 2020-09-14: 1000 mL

## 2020-09-14 MED ORDER — PROPOFOL 10 MG/ML IV BOLUS
INTRAVENOUS | Status: DC | PRN
  Administered 2020-09-14: 20 mg via INTRAVENOUS
  Administered 2020-09-14: 130 mg via INTRAVENOUS

## 2020-09-14 MED ORDER — DIAZEPAM 5 MG PO TABS
5.0000 mg | ORAL_TABLET | Freq: Four times a day (QID) | ORAL | Status: DC | PRN
  Administered 2020-09-14 – 2020-09-15 (×4): 5 mg via ORAL
  Filled 2020-09-14 (×4): qty 1

## 2020-09-14 MED ORDER — FERROUS SULFATE 325 (65 FE) MG PO TABS
325.0000 mg | ORAL_TABLET | Freq: Every day | ORAL | Status: DC
  Administered 2020-09-15: 325 mg via ORAL
  Filled 2020-09-14: qty 1

## 2020-09-14 MED ORDER — ASCORBIC ACID 500 MG PO TABS
1000.0000 mg | ORAL_TABLET | Freq: Every day | ORAL | Status: DC
  Administered 2020-09-15: 1000 mg via ORAL
  Filled 2020-09-14: qty 2

## 2020-09-14 MED ORDER — PHENOL 1.4 % MT LIQD: 1.0000 | OROMUCOSAL | Status: DC | PRN

## 2020-09-14 MED ORDER — OXYCODONE HCL 5 MG PO TABS
5.0000 mg | ORAL_TABLET | Freq: Once | ORAL | Status: DC | PRN
Start: 2020-09-14 — End: 2020-09-14

## 2020-09-14 MED ORDER — BISACODYL 10 MG RE SUPP: 10.0000 mg | Freq: Every day | RECTAL | Status: DC | PRN

## 2020-09-14 MED ORDER — SODIUM CHLORIDE 0.9% FLUSH
3.0000 mL | Freq: Two times a day (BID) | INTRAVENOUS | Status: DC
  Administered 2020-09-14: 3 mL via INTRAVENOUS

## 2020-09-14 MED ORDER — B COMPLEX-C PO TABS
1.0000 | ORAL_TABLET | Freq: Every day | ORAL | Status: DC
  Administered 2020-09-15: 1 via ORAL
  Filled 2020-09-14: qty 1

## 2020-09-14 MED ORDER — OXYCODONE HCL 5 MG PO TABS
10.0000 mg | ORAL_TABLET | ORAL | Status: DC | PRN
  Administered 2020-09-14 – 2020-09-15 (×5): 10 mg via ORAL
  Filled 2020-09-14 (×5): qty 2

## 2020-09-14 MED ORDER — HYDROMORPHONE HCL 1 MG/ML IJ SOLN
1.0000 mg | INTRAMUSCULAR | Status: DC | PRN
  Administered 2020-09-14: 1 mg via INTRAVENOUS
  Filled 2020-09-14: qty 1

## 2020-09-14 MED ORDER — FLEET ENEMA 7-19 GM/118ML RE ENEM: 1.0000 | ENEMA | Freq: Once | RECTAL | Status: DC | PRN

## 2020-09-14 MED ORDER — CHLORHEXIDINE GLUCONATE CLOTH 2 % EX PADS: 6.0000 | MEDICATED_PAD | Freq: Once | CUTANEOUS | Status: DC

## 2020-09-14 MED ORDER — ROCURONIUM BROMIDE 10 MG/ML (PF) SYRINGE
PREFILLED_SYRINGE | INTRAVENOUS | Status: DC | PRN
  Administered 2020-09-14: 60 mg via INTRAVENOUS
  Administered 2020-09-14 (×2): 40 mg via INTRAVENOUS
  Administered 2020-09-14: 25 mg via INTRAVENOUS

## 2020-09-14 MED ORDER — THROMBIN 20000 UNITS EX SOLR
CUTANEOUS | Status: DC | PRN
  Administered 2020-09-14: 20 mL via TOPICAL

## 2020-09-14 MED ORDER — ROCURONIUM BROMIDE 10 MG/ML (PF) SYRINGE
PREFILLED_SYRINGE | INTRAVENOUS | Status: AC
  Filled 2020-09-14: qty 10

## 2020-09-14 MED ORDER — CHLORHEXIDINE GLUCONATE 0.12 % MT SOLN
15.0000 mL | Freq: Once | OROMUCOSAL | Status: AC
  Administered 2020-09-14: 15 mL via OROMUCOSAL
  Filled 2020-09-14: qty 15

## 2020-09-14 MED ORDER — LACTATED RINGERS IV SOLN: INTRAVENOUS | Status: DC

## 2020-09-14 MED ORDER — FENTANYL CITRATE (PF) 100 MCG/2ML IJ SOLN
25.0000 ug | INTRAMUSCULAR | Status: DC | PRN
  Administered 2020-09-14 (×3): 50 ug via INTRAVENOUS

## 2020-09-14 MED ORDER — LIDOCAINE 2% (20 MG/ML) 5 ML SYRINGE
INTRAMUSCULAR | Status: DC | PRN
  Administered 2020-09-14: 100 mg via INTRAVENOUS

## 2020-09-14 MED ORDER — B COMPLEX VITAMINS PO CAPS: 1.0000 | ORAL_CAPSULE | Freq: Every day | ORAL | Status: DC

## 2020-09-14 MED ORDER — MENTHOL 3 MG MT LOZG: 1.0000 | LOZENGE | OROMUCOSAL | Status: DC | PRN

## 2020-09-14 MED ORDER — LIDOCAINE 2% (20 MG/ML) 5 ML SYRINGE
INTRAMUSCULAR | Status: AC
  Filled 2020-09-14: qty 5

## 2020-09-14 MED ORDER — MIDAZOLAM HCL 2 MG/2ML IJ SOLN
INTRAMUSCULAR | Status: DC | PRN
  Administered 2020-09-14: 2 mg via INTRAVENOUS

## 2020-09-14 MED ORDER — ONDANSETRON HCL 4 MG/2ML IJ SOLN: 4.0000 mg | Freq: Four times a day (QID) | INTRAMUSCULAR | Status: DC | PRN

## 2020-09-14 MED ORDER — SODIUM CHLORIDE 0.9 % IV SOLN: INTRAVENOUS | Status: DC | PRN

## 2020-09-14 MED ORDER — ACETAMINOPHEN 650 MG RE SUPP: 650.0000 mg | RECTAL | Status: DC | PRN

## 2020-09-14 MED ORDER — ADULT MULTIVITAMIN W/MINERALS CH
1.0000 | ORAL_TABLET | Freq: Every day | ORAL | Status: DC
  Administered 2020-09-15: 1 via ORAL
  Filled 2020-09-14 (×2): qty 1

## 2020-09-14 SURGICAL SUPPLY — 66 items
ADH SKN CLS APL DERMABOND .7 (GAUZE/BANDAGES/DRESSINGS) ×1
APL SKNCLS STERI-STRIP NONHPOA (GAUZE/BANDAGES/DRESSINGS) ×1
BAG DECANTER FOR FLEXI CONT (MISCELLANEOUS) ×2 IMPLANT
BENZOIN TINCTURE PRP APPL 2/3 (GAUZE/BANDAGES/DRESSINGS) ×2 IMPLANT
BLADE CLIPPER SURG (BLADE) ×1 IMPLANT
BONE GRAFTON DBF INJECT 6CC (Bone Implant) ×1 IMPLANT
BUR CUTTER 7.0 ROUND (BURR) IMPLANT
BUR MATCHSTICK NEURO 3.0 LAGG (BURR) ×2 IMPLANT
CAGE EXP CATALYFT 9 (Plate) ×4 IMPLANT
CANISTER SUCT 3000ML PPV (MISCELLANEOUS) ×2 IMPLANT
CAP LCK SPNE (Orthopedic Implant) ×6 IMPLANT
CAP LOCK SPINE RADIUS (Orthopedic Implant) IMPLANT
CAP LOCKING (Orthopedic Implant) ×12 IMPLANT
CARTRIDGE OIL MAESTRO DRILL (MISCELLANEOUS) ×1 IMPLANT
CLSR STERI-STRIP ANTIMIC 1/2X4 (GAUZE/BANDAGES/DRESSINGS) ×1 IMPLANT
CNTNR URN SCR LID CUP LEK RST (MISCELLANEOUS) ×1 IMPLANT
CONT SPEC 4OZ STRL OR WHT (MISCELLANEOUS) ×2
COVER BACK TABLE 60X90IN (DRAPES) ×2 IMPLANT
COVER WAND RF STERILE (DRAPES) ×2 IMPLANT
DECANTER SPIKE VIAL GLASS SM (MISCELLANEOUS) ×2 IMPLANT
DERMABOND ADVANCED (GAUZE/BANDAGES/DRESSINGS) ×1
DERMABOND ADVANCED .7 DNX12 (GAUZE/BANDAGES/DRESSINGS) ×1 IMPLANT
DIFFUSER DRILL AIR PNEUMATIC (MISCELLANEOUS) ×2 IMPLANT
DRAPE C-ARM 42X72 X-RAY (DRAPES) ×4 IMPLANT
DRAPE HALF SHEET 40X57 (DRAPES) IMPLANT
DRAPE LAPAROTOMY 100X72X124 (DRAPES) ×2 IMPLANT
DRAPE SURG 17X23 STRL (DRAPES) ×8 IMPLANT
DRSG OPSITE POSTOP 4X6 (GAUZE/BANDAGES/DRESSINGS) ×2 IMPLANT
DRSG OPSITE POSTOP 4X8 (GAUZE/BANDAGES/DRESSINGS) ×1 IMPLANT
DURAPREP 26ML APPLICATOR (WOUND CARE) ×2 IMPLANT
ELECT REM PT RETURN 9FT ADLT (ELECTROSURGICAL) ×2
ELECTRODE REM PT RTRN 9FT ADLT (ELECTROSURGICAL) ×1 IMPLANT
EVACUATOR 1/8 PVC DRAIN (DRAIN) IMPLANT
GAUZE 4X4 16PLY RFD (DISPOSABLE) IMPLANT
GAUZE SPONGE 4X4 12PLY STRL (GAUZE/BANDAGES/DRESSINGS) IMPLANT
GLOVE BIO SURGEON STRL SZ 6.5 (GLOVE) ×2 IMPLANT
GLOVE BIOGEL M 6.5 STRL (GLOVE) ×5 IMPLANT
GLOVE ECLIPSE 9.0 STRL (GLOVE) ×4 IMPLANT
GLOVE SURG UNDER POLY LF SZ6.5 (GLOVE) ×3 IMPLANT
GLOVE SURG UNDER POLY LF SZ7 (GLOVE) ×1 IMPLANT
GOWN STRL REUS W/ TWL LRG LVL3 (GOWN DISPOSABLE) IMPLANT
GOWN STRL REUS W/ TWL XL LVL3 (GOWN DISPOSABLE) ×2 IMPLANT
GOWN STRL REUS W/TWL 2XL LVL3 (GOWN DISPOSABLE) IMPLANT
GOWN STRL REUS W/TWL LRG LVL3 (GOWN DISPOSABLE) ×4
GOWN STRL REUS W/TWL XL LVL3 (GOWN DISPOSABLE) ×4
KIT BASIN OR (CUSTOM PROCEDURE TRAY) ×2 IMPLANT
KIT TURNOVER KIT B (KITS) ×2 IMPLANT
MILL MEDIUM DISP (BLADE) ×2 IMPLANT
NEEDLE HYPO 22GX1.5 SAFETY (NEEDLE) ×2 IMPLANT
NS IRRIG 1000ML POUR BTL (IV SOLUTION) ×2 IMPLANT
OIL CARTRIDGE MAESTRO DRILL (MISCELLANEOUS) ×2
PACK LAMINECTOMY NEURO (CUSTOM PROCEDURE TRAY) ×2 IMPLANT
ROD 5.5X60MM PURPLE (Rod) ×2 IMPLANT
SCREW 5.75X40M (Screw) ×2 IMPLANT
SCREW 5.75X45MM (Screw) ×4 IMPLANT
SPONGE SURGIFOAM ABS GEL 100 (HEMOSTASIS) ×2 IMPLANT
STRIP CLOSURE SKIN 1/2X4 (GAUZE/BANDAGES/DRESSINGS) ×4 IMPLANT
SUT VIC AB 0 CT1 18XCR BRD8 (SUTURE) ×2 IMPLANT
SUT VIC AB 0 CT1 8-18 (SUTURE) ×4
SUT VIC AB 2-0 CT1 18 (SUTURE) ×2 IMPLANT
SUT VIC AB 3-0 SH 8-18 (SUTURE) ×4 IMPLANT
SYR CONTROL 10ML LL (SYRINGE) ×1 IMPLANT
TOWEL GREEN STERILE (TOWEL DISPOSABLE) ×2 IMPLANT
TOWEL GREEN STERILE FF (TOWEL DISPOSABLE) ×2 IMPLANT
TRAY FOLEY MTR SLVR 16FR STAT (SET/KITS/TRAYS/PACK) ×2 IMPLANT
WATER STERILE IRR 1000ML POUR (IV SOLUTION) ×2 IMPLANT

## 2020-09-14 NOTE — H&P (Signed)
LYNDEL SARATE is an 65 y.o. male.   Chief Complaint: Right leg pain HPI: [age]-year-old male with severe lower back pain with radiation to his right lower extremity with associated sensory loss and progressive weakness.  Work-up demonstrates evidence of severe multilevel disc degeneration with degenerative scoliosis.  Patient with severe lateral recess and foraminal stenosis on the right at L4-5 and a lytic grade 1 L5S1 spondylolisthesis with severe foraminal stenosis.  As most of his symptoms correspond mostly to the L5 nerve root distribution the plan is to approach the L4-5 and L5-S1 levels alone.  Patient is aware that the remainder of his lumbar spine may continue to generate and may need additional care in the future.  Past Medical History:  Diagnosis Date  . Allergy   . Arthritis   . Blood transfusion without reported diagnosis   . Chicken pox   . Congenital hip deformity   . Glaucoma   . Neuromuscular disorder ([place]) 11/2019   sciatica    Past Surgical History:  Procedure Laterality Date  . COLONOSCOPY  06/17/2015   [place]  . COLONOSCOPY  07/20/2020   [place]  . HAMMER TOE SURGERY  07/28/2011   Procedure: HAMMER TOE CORRECTION;  Surgeon: Ninetta Lights, MD;  Location: [facility];  Service: Orthopedics;  Laterality: Right;  right 2nd and 4th toes correction hammer toe, capsulotomy metatarsal-phalangeal joints  . [place] 2010   rt total hip-8/10-multiple rt hip surgeries- had 4 prior to 1986 as a child  . POLYPECTOMY    . THUMB ARTHROSCOPY  2008   rt    Family History  Problem Relation Age of Onset  . Cancer Mother 48       history of colon cancer  . Rosacea Mother   . Colon cancer Mother 4  . Colon polyps Mother   . Rosacea Father   . Lung disease Father        ?pulmonary fibrosis  . Cancer Cousin        colon  . Colon cancer Cousin   . Colon cancer Maternal Grandmother   . Alcohol abuse Brother   . Depression Brother   . Thyroid  disease Son        ?hyperthyroid  . 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 Esophageal cancer Neg Hx   . Rectal cancer Neg Hx   . Stomach cancer Neg Hx    Social History:  reports that he has been smoking cigarettes. He started smoking about 47 years ago. He has a 46.00 pack-year smoking history. He has never used smokeless tobacco. He reports current alcohol use of about 14.0 standard drinks of alcohol per week. He reports that he does not use drugs.  Allergies: No Known Allergies  Medications Prior to Admission  Medication Sig Dispense Refill  . Ascorbic Acid (VITAMIN C) 1000 MG tablet Take 1,000 mg by mouth daily.    [name] b complex vitamins capsule Take 1 capsule by mouth daily.    . ferrous sulfate 325 (65 FE) MG tablet Take 325 mg by mouth daily.    . fluticasone (FLONASE) 50 MCG/ACT nasal spray Place 2 sprays into both nostrils daily. 16 g 1  . ibuprofen (ADVIL) 200 MG tablet Take 400 mg by mouth every 6 (six) hours as needed for headache or moderate pain.    . Multiple Vitamin (MULTIVITAMIN) tablet Take 1 tablet by mouth daily.    [name]  Omega-3 Fatty Acids (FISH OIL PO) Take 1 capsule by mouth daily.    . TRAVATAN Z 0.004 % SOLN ophthalmic solution Place 1 drop into both eyes daily.  1    No results found for this or any previous visit (from the past 48 hour(s)). No results found.  Pertinent items noted in HPI and remainder of comprehensive ROS otherwise negative.  Blood pressure (!) 145/78, pulse 86, temperature 97.8 F (36.6 C), temperature source Temporal, resp. rate 18, height 6\' 2"  (1.88 m), weight 72.3 kg, SpO2 99 %.  Patient is awake and alert.  He is oriented and appropriate.  Speech is fluent.  Judgment insight are intact.  Cranial nerve function normal bilaterally.  Motor and sensory function of the extremities normal except right extensor houses longus 4-/5 right anterior tibialis 4/5.  Extensor examination decreased sensation pinprick light touch  in his right L4 and L5 dermatomes.  Deep tender if is normal active except his Achilles reflexes are absent bilaterally.  Gait is antalgic.  Posture is flexed peer examination head ears eyes nose and throat is unremarkable chest and abdomen are benign.  Extremities are free from injury or deformity.  Assessment/Plan L4-5 severe lateral recess and foraminal stenosis with radiculopathy, L5S1 lytic spondylolisthesis with severe foraminal stenosis and radiculopathy.  Plan bilateral L4-5 and L5-S1 decompressive laminotomies and foraminotomies followed by posterior lumbar interbody fusion utilizing interbody cages, local harvested autograft, and augmented with posterior arthrodesis utilizing segmental pedicle screw fixation and local autografting.  Risks and benefits of been explained.  Patient wishes to proceed.  Cooper Render Sadie Hazelett 09/14/2020, 7:46 AM

## 2020-09-14 NOTE — Anesthesia Postprocedure Evaluation (Signed)
Anesthesia Post Note  Patient: Brett [name]  Procedure(s) Performed: POSTERIOR LUMBAR INTERBODY FUSION - LUMBAR FOUR-LUMBAR FIVE - LUMBAR FIVE-SACRAL ONE (N/A Back)     Patient location during evaluation: PACU Anesthesia Type: General Level of consciousness: awake and alert and oriented Pain management: pain level controlled Vital Signs Assessment: post-procedure vital signs reviewed and stable Respiratory status: spontaneous breathing, nonlabored ventilation and respiratory function stable Cardiovascular status: blood pressure returned to baseline Postop Assessment: no apparent nausea or vomiting Anesthetic complications: no   No complications documented.  Last Vitals:  Vitals:   09/14/20 1200 09/14/20 1222  BP: 127/70 112/74  Pulse: 71 70  Resp: 10 13  Temp: 36.6 C (!) 36.3 C  SpO2: 95% 97%    Last Pain:  Vitals:   09/14/20 1222  TempSrc: Oral  PainSc:                  Brennan Bailey

## 2020-09-14 NOTE — Anesthesia Procedure Notes (Signed)
Procedure Name: Intubation Date/Time: 09/14/2020 8:06 AM Performed by: Verdie Drown, CRNA Pre-anesthesia Checklist: Patient identified, Emergency Drugs available, Suction available and Patient being monitored Patient Re-evaluated:Patient Re-evaluated prior to induction Oxygen Delivery Method: Circle System Utilized Preoxygenation: Pre-oxygenation with 100% oxygen Induction Type: IV induction Ventilation: Mask ventilation without difficulty Laryngoscope Size: Mac and 4 Grade View: Grade I Tube type: Oral Tube size: 7.5 mm Number of attempts: 1 Airway Equipment and Method: Stylet and Oral airway Placement Confirmation: ETT inserted through vocal cords under direct vision,  positive ETCO2 and breath sounds checked- equal and bilateral Secured at: 25 cm Tube secured with: Tape Dental Injury: Teeth and Oropharynx as per pre-operative assessment

## 2020-09-14 NOTE — Brief Op Note (Signed)
09/14/2020  11:16 AM  PATIENT:  Brett [name]  65 y.o. male  PRE-OPERATIVE DIAGNOSIS:  Spondylolisthesis  POST-OPERATIVE DIAGNOSIS:  Spondylolisthesis  PROCEDURE:  Procedure(s): POSTERIOR LUMBAR INTERBODY FUSION - LUMBAR FOUR-LUMBAR FIVE - LUMBAR FIVE-SACRAL ONE (N/A)  SURGEON:  Surgeon(s) and Role:    * Earnie Larsson, MD - Primary  PHYSICIAN ASSISTANT:   ASSISTANTSMearl Latin   ANESTHESIA:   general  EBL:  400 mL   BLOOD ADMINISTERED:none  DRAINS: none   LOCAL MEDICATIONS USED:  MARCAINE     SPECIMEN:  No Specimen  DISPOSITION OF SPECIMEN:  N/A  COUNTS:  YES  TOURNIQUET:  * No tourniquets in log *  DICTATION: .Dragon Dictation  PLAN OF CARE: Admit to inpatient   PATIENT DISPOSITION:  PACU - hemodynamically stable.   Delay start of Pharmacological VTE agent (>24hrs) due to surgical blood loss or risk of bleeding: yes

## 2020-09-14 NOTE — Progress Notes (Signed)
Orthopedic Tech Progress Note Patient Details:  Brett [name] 1956-02-27 [id number] RN said patient has brace Patient ID: Brett [name], male   DOB: [birth year], 65 y.o.   MRN: [id number]   Brett [name] 09/14/2020, 12:49 PM

## 2020-09-14 NOTE — Op Note (Signed)
Date of procedure: 09/14/2020  Date of dictation: Same  Service: Neurosurgery  Preoperative diagnosis: L4-5 severe degenerative disc disease with severe right-sided lateral recess and foraminal stenosis with radiculopathy  Grade 2 L5-S1 lytic spondylolisthesis with severe foraminal stenosis  Sagittal plane imbalance  Postoperative diagnosis: Same  Procedure Name: Bilateral L4-5 decompressive laminotomies with foraminotomies, more than would be required for simple interbody fusion alone.  L5S1 Gill procedure with bilateral L5-S1 decompressive foraminotomies  L4-L5 ponte osteotomies for sagittal plane restoration  L4-5, L5-S1 posterior lumbar interbody fusion utilizing interbody cages and locally harvested autograft and morselized allograft  L4-5 S1 posterior lateral arthrodesis utilizing segmental pedicle screw fixation and local autograft  Surgeon:Yenesis Even A.Arien Morine, M.D.  Asst. Surgeon: Reinaldo Meeker, NP  Anesthesia: General  Indication: 65 year old male with severe back and right lower extremity pain paresthesias and weakness failing conservative management her work-up demonstrates evidence of severe degeneration throughout his lumbar spine with degenerative disc space collapse and some degree of degenerative scoliosis.  Patient with severe disc degeneration and disc collapse with marked foraminal stenosis and lateral recess stenosis on the right side causing compression of the exiting right L4 and traversing right L5 nerve root.  Patient with a grade 2 L5-S1 lytic spondylolisthesis with severe foraminal stenosis.  Patient is failed conservative management.  We decided to move forward with decompression and fusion at the L4-5 and L5-S1 levels where he seems to be primarily symptomatic.  Patient is aware that he may eventually require additional surgery in the remainder of his lumbar spine.  Operative note: After induction of anesthesia, patient positioned prone onto Wilson frame and appropriate  padded.  Patient's lumbar region prepped and draped sterilely.  Incision made overlying L4-L5 and S1.  Dissection performed bilaterally.  Retractor placed.  Fluoroscopy used.  Levels confirmed.  Decompressive laminectomy was then performed at L4 and L5 removing the entire lamina of L5 and the inferior three quarters of the lamina of L4.  Spinous processes were resected at both levels.  Inferior facetectomies of L4 were performed bilaterally.  Superior facetectomies of L5 were performed bilaterally.  The free-floating [place] lamina and inferior facets of L5 were resected bilaterally.  The rudimentary inferior facet of L5 was also resected.  Superior facet of S1 was resected.  The extensive facet resections at L45 constituted ponte osteotomies which were necessary for mobilization and improvement of his sagittal balance.  The Gill procedure improved his sagittal balance and L5-S1.  Bilateral discectomies were then performed at L4-5 and L5-S1.  The spaces were then sequentially distracted.  The spaces were remeasured and 9 mm [employer] expandable cages were found to be most appropriate.  Starting first at L4-5 with a distractor placed the patient's right side to space was cleaned of all soft tissue and a 9 mm expandable titanium cage was then impacted into place and expanded.  Distractor was moved patient's right side.  To space prepared on the right side.  Morselized autograft packed in the interspace.  A second cage was then impacted into place and expanded.  The disc at L5-S1 was approached in a similar fashion again using 9 mm expandable implants and morselized autograft.  Pedicles at L4-L5 and S1 were identified using surface landmarks and intraoperative fluoroscopy and superficial bone around the pedicle was then removed using high-speed drill each pedicle was then probed using pedicle all each pedicle tract was then probed and found to be solidly within the bone.  Each pedicle all track was then tapped with  a screw tap  each screw temple was probed and found to be solidly within the bone.  5.75 mm radius brand screws from [employer] placed bilaterally at L4-L5 and S1.  Final images reveal good position of the cages and the hardware at the proper operative level with marked improvement of the deformity.  Transverse processes and sacral ala were decorticated.  Morselized autograft was packed posterior laterally.  Each cage was then filled with demineralized bone fibers.  Gelfoam was placed over the laminotomy sites.  Wound was then closed in layers.  Vancomycin powder was placed in the deep wound space.  There were no apparent complications.  Patient tolerated the procedure well and he returned to the recovery room postop.

## 2020-09-14 NOTE — Transfer of Care (Signed)
Immediate Anesthesia Transfer of Care Note  Patient: Brett [name]  Procedure(s) Performed: POSTERIOR LUMBAR INTERBODY FUSION - LUMBAR FOUR-LUMBAR FIVE - LUMBAR FIVE-SACRAL ONE (N/A Back)  Patient Location: PACU  Anesthesia Type:General  Level of Consciousness: awake, alert , oriented and patient cooperative  Airway & Oxygen Therapy: Patient Spontanous Breathing and Patient connected to face mask oxygen  Post-op Assessment: Report given to RN and Post -op Vital signs reviewed and stable  Post vital signs: Reviewed and stable  Last Vitals:  Vitals Value Taken Time  BP 126/99 09/14/20 1127  Temp    Pulse 93 09/14/20 1130  Resp 14 09/14/20 1130  SpO2 95 % 09/14/20 1130  Vitals shown include unvalidated device data.  Last Pain:  Vitals:   09/14/20 0644  TempSrc:   PainSc: 4       Patients Stated Pain Goal: 2 (09/62/83 6629)  Complications: No complications documented.

## 2020-09-15 MED ORDER — DOCUSATE SODIUM 100 MG PO CAPS: 100.0000 mg | ORAL_CAPSULE | Freq: Two times a day (BID) | ORAL | 0 refills | Status: DC

## 2020-09-15 MED ORDER — HYDROCODONE-ACETAMINOPHEN 10-325 MG PO TABS
1.0000 | ORAL_TABLET | ORAL | Status: DC | PRN
Start: 2020-09-15 — End: 2020-09-15

## 2020-09-15 MED ORDER — OXYCODONE HCL 10 MG PO TABS: 10.0000 mg | ORAL_TABLET | ORAL | 0 refills | Status: DC | PRN

## 2020-09-15 MED ORDER — DIAZEPAM 5 MG PO TABS: 5.0000 mg | ORAL_TABLET | Freq: Four times a day (QID) | ORAL | 0 refills | Status: DC | PRN

## 2020-09-15 MED ORDER — ACETAMINOPHEN 325 MG PO TABS: 650.0000 mg | ORAL_TABLET | ORAL | 1 refills | Status: DC | PRN

## 2020-09-15 NOTE — Evaluation (Signed)
Physical Therapy Evaluation Patient Details Name: Brett [name] MRN: [id number] DOB: 04/11/1956 Today's Date: 09/15/2020   History of Present Illness  65y.o. male s/p  L5-S1 decompression and fusion by Dr. Annette Stable on 09/14/2020. PMH includes glaucoma, congenital hip deformity, neuromuscular disorder    Clinical Impression  Pt admitted with above diagnosis. At the time of PT eval, pt was able to demonstrate transfers and ambulation with gross supervision for safety with RW for support, and min guard assist for stair negotiation. Pt was educated on precautions, brace application/wearing schedule, appropriate activity progression, and car transfer. Pt currently with functional limitations due to the deficits listed below (see PT Problem List). Pt will benefit from skilled PT to increase their independence and safety with mobility to allow discharge to the venue listed below.      Follow Up Recommendations No PT follow up;Supervision for mobility/OOB    Equipment Recommendations  Rolling walker with 5" wheels    Recommendations for Other Services       Precautions / Restrictions Precautions Precautions: Back;Fall Precaution Booklet Issued: Yes (comment) Precaution Comments: Verbally reviewed precautions throughout functional mobility Required Braces or Orthoses: Spinal Brace Spinal Brace: Lumbar corset Restrictions Weight Bearing Restrictions: No      Mobility  Bed Mobility Overal bed mobility: Modified Independent Bed Mobility: Rolling;Sidelying to Sit General bed mobility comments: VC's and several reps of supine<>sit for proper log roll technique.    Transfers Overall transfer level: Needs assistance Equipment used: Rolling walker (2 wheeled) Transfers: Sit to/from Stand Sit to Stand: Supervision         General transfer comment: Light supervision for maintenance of precautions and optimal posture with sit<>stand.  Ambulation/Gait Ambulation/Gait assistance:  Supervision Gait Distance (Feet): 250 Feet Assistive device: Rolling walker (2 wheeled) Gait Pattern/deviations: Step-through pattern;Decreased stride length;Trunk flexed Gait velocity: Decreased Gait velocity interpretation: 1.31 - 2.62 ft/sec, indicative of limited community ambulator General Gait Details: VC's for improved posture, closer walker proximity, and forward gaze. Pt was able to ambulate well without overt LOB noted.  Stairs Stairs: Yes Stairs assistance: Min guard Stair Management: One rail Right;Alternating pattern;Backwards Number of Stairs: 5 General stair comments: VC's for seqeuncing and general safety.  Wheelchair Mobility    Modified Rankin (Stroke Patients Only)       Balance Overall balance assessment: Needs assistance Sitting-balance support: No upper extremity supported;Feet supported Sitting balance-Leahy Scale: Good     Standing balance support: Bilateral upper extremity supported;No upper extremity supported;During functional activity Standing balance-Leahy Scale: Fair Standing balance comment: able to static stand unsupported for brief period of time;pt demonstrates preference for UE support demonstrates improved stability in standing with UE support                             Pertinent Vitals/Pain Pain Assessment: 0-10 Pain Score: 4  Pain Location: surgical site Pain Descriptors / Indicators: Sore;Operative site guarding Pain Intervention(s): Limited activity within patient's tolerance;Monitored during session;Repositioned    Home Living Family/patient expects to be discharged to:: Private residence Living Arrangements: Spouse/significant other Available Help at Discharge: Family;Available 24 hours/day Type of Home: Other(Comment) (townhome) Home Access: Stairs to enter   [employer] of Steps: 2 Home Layout: One level Home Equipment: Shower seat - built in Additional Comments: pt's daughter lives with him     Prior Function Level of Independence: Independent         Comments: pt was driving, independent with medications, and cooking. daughter  was completing grocery shopping     Hand Dominance   Dominant Hand: Right    Extremity/Trunk Assessment   Upper Extremity Assessment Upper Extremity Assessment: Defer to OT evaluation    Lower Extremity Assessment Lower Extremity Assessment: Generalized weakness (Consistent with pre-op diagnosis)    Cervical / Trunk Assessment Cervical / Trunk Assessment: Other exceptions Cervical / Trunk Exceptions: back precautions  Communication   Communication: No difficulties  Cognition Arousal/Alertness: Awake/alert Behavior During Therapy: WFL for tasks assessed/performed Overall Cognitive Status: Within Functional Limits for tasks assessed                                 General Comments: pt demonstrated carryover of back precautions with minimal cues      General Comments General comments (skin integrity, edema, etc.): vss;daughter present during session    Exercises     Assessment/Plan    PT Assessment Patient needs continued PT services  PT Problem List Decreased strength;Decreased activity tolerance;Decreased balance;Decreased mobility;Decreased knowledge of use of DME;Decreased safety awareness;Decreased knowledge of precautions;Pain       PT Treatment Interventions DME instruction;Gait training;Stair training;Functional mobility training;Therapeutic activities;Therapeutic exercise;Neuromuscular re-education;Patient/family education    PT Goals (Current goals can be found in the Care Plan section)  Acute Rehab PT Goals Patient Stated Goal: to go home PT Goal Formulation: With patient/family Time For Goal Achievement: 09/22/20 Potential to Achieve Goals: Good    Frequency Min 5X/week   Barriers to discharge        Co-evaluation               AM-PAC PT "6 Clicks" Mobility  Outcome Measure Help needed  turning from your back to your side while in a flat bed without using bedrails?: None Help needed moving from lying on your back to sitting on the side of a flat bed without using bedrails?: None Help needed moving to and from a bed to a chair (including a wheelchair)?: A Little Help needed standing up from a chair using your arms (e.g., wheelchair or bedside chair)?: A Little Help needed to walk in hospital room?: A Little Help needed climbing 3-5 steps with a railing? : A Little 6 Click Score: 20    End of Session Equipment Utilized During Treatment: Gait belt;Back brace Activity Tolerance: Patient tolerated treatment well Patient left: with family/visitor present (Sitting EOB with daughter present) Nurse Communication: Mobility status PT Visit Diagnosis: Unsteadiness on feet (R26.81);Pain Pain - part of body:  (back)    Time: 3151-7616 PT Time Calculation (min) (ACUTE ONLY): 22 min   Charges:   PT Evaluation $PT Eval Low Complexity: 1 Low          Brett [name], PT, DPT Acute Rehabilitation Services Pager: [phone] Office: [phone]   Thelma Comp 09/15/2020, 12:27 PM

## 2020-09-15 NOTE — Progress Notes (Signed)
CSW asked about Code 73 by RN, CSW messaged with Luz Lex in [place] who states that pt does not require Code 52. Lurline Idol, MSW, LCSW 2/8/202210:38 AM

## 2020-09-15 NOTE — Evaluation (Addendum)
Occupational Therapy Evaluation Patient Details Name: Brett [name] MRN: [id number] DOB: 10/13/1955 Today's Date: 09/15/2020    History of Present Illness 65y.o. male s/p  L5-S1 decompression and fusion by Dr. Annette Stable on 09/14/2020. PMH includes glaucoma, congenital hip deformity, neuromuscular disorder   Clinical Impression   PTA, pt was living at home in a one level townhome with his daughter, pt reports he was independent with ADL/IADL and functional mobility. No reported. Pt currently requires minguard for functional mobility at RW level. He requires minguard for LB dressing with AE and minguard for grooming while standing at sink level. Pt educated on back precautions and importance of adherence to precautions. Pt limited by instability, pt with decreased safety awareness at times, required minimal cues for safe use of DME, proper adherence to back precautions. Pt's daughter present and very attentive/supportive, demonstrated ability to provide appropriate level of assistance and cues to ensure safety with ADL/IADL and functional mobility. Due to decline in current level of function, pt would benefit from acute OT to address established goals to facilitate safe D/C to venue listed below. At this time, recommend d/c home with appropriate level of assistance from daughter as needed. Patient evaluated by Occupational Therapy with no further acute OT needs identified. All education has been completed and the patient has no further questions. See below for any follow-up Occupational Therapy or equipment needs. OT to sign off. Thank you for referral.      Follow Up Recommendations  Supervision - Intermittent (with mobility and ADL initially)    Equipment Recommendations  None recommended by OT    Recommendations for Other Services       Precautions / Restrictions Precautions Precautions: Back;Fall Precaution Booklet Issued: Yes (comment) Precaution Comments: provided and verbally reviewed  back precautions Required Braces or Orthoses: Spinal Brace Spinal Brace: Lumbar corset Restrictions Weight Bearing Restrictions: No      Mobility Bed Mobility Overal bed mobility: Needs Assistance Bed Mobility: Rolling;Sidelying to Sit Rolling: Supervision Sidelying to sit: Supervision       General bed mobility comments: cues for proper log rolling technique    Transfers Overall transfer level: Needs assistance Equipment used: Rolling walker (2 wheeled) Transfers: Sit to/from Stand Sit to Stand: Min guard         General transfer comment: minguard for safety and stability, pt with loss of balance posteriorly, required minA for correction    Balance Overall balance assessment: Needs assistance Sitting-balance support: No upper extremity supported;Feet supported Sitting balance-Leahy Scale: Good     Standing balance support: Bilateral upper extremity supported;No upper extremity supported;During functional activity Standing balance-Leahy Scale: Fair Standing balance comment: able to static stand unsupported for brief period of time;pt demonstrates preference for UE support demonstrates improved stability in standing with UE support                           ADL either performed or assessed with clinical judgement   ADL Overall ADL's : Needs assistance/impaired Eating/Feeding: Independent   Grooming: Supervision/safety;Standing Grooming Details (indicate cue type and reason): completed at sink level Upper Body Bathing: Set up;Sitting   Lower Body Bathing: Min guard;Sit to/from stand   Upper Body Dressing : Set up;Sitting Upper Body Dressing Details (indicate cue type and reason): donned shirt, donned/doffed back brace Lower Body Dressing: Min guard;Sit to/from stand Lower Body Dressing Details (indicate cue type and reason): utilized AE to assist with donning/doffing LB clothing, pt required min cues for  back precautions Toilet Transfer: Min  Marine scientist Details (indicate cue type and reason): cues for safe use of RW Toileting- Clothing Manipulation and Hygiene: Min guard;Sit to/from stand Toileting - Clothing Manipulation Details (indicate cue type and reason): instability noted with standing, intermittent loss of balance posteriorly pt utilized BLE to brace and correct in addition to therapist's assistance for correction     Functional mobility during ADLs: Min guard;Rolling walker General ADL Comments: limited by decreased awareness of back precautions, instability, BLE weakness     Vision Patient Visual Report: No change from baseline       Perception     Praxis      Pertinent Vitals/Pain Pain Assessment: 0-10 Pain Score: 4  Pain Location: surgical site Pain Descriptors / Indicators: Sore Pain Intervention(s): Limited activity within patient's tolerance;Monitored during session;Repositioned     Hand Dominance Right   Extremity/Trunk Assessment Upper Extremity Assessment Upper Extremity Assessment: Overall WFL for tasks assessed   Lower Extremity Assessment Lower Extremity Assessment: Generalized weakness;Defer to PT evaluation   Cervical / Trunk Assessment Cervical / Trunk Assessment: Other exceptions Cervical / Trunk Exceptions: back precautions   Communication Communication Communication: No difficulties   Cognition Arousal/Alertness: Awake/alert Behavior During Therapy: WFL for tasks assessed/performed Overall Cognitive Status: Within Functional Limits for tasks assessed                                 General Comments: pt demonstrated carryover of back precautions with minimal cues   General Comments  vss;daughter present during session    Exercises     Shoulder Instructions      Home Living Family/patient expects to be discharged to:: Private residence Living Arrangements: Spouse/significant other Available Help at Discharge: Family;Available 24  hours/day Type of Home: Other(Comment) (townhome) Home Access: Stairs to enter [employer] of Steps: 2   Home Layout: One level     Bathroom Shower/Tub: Occupational psychologist: Handicapped height Bathroom Accessibility: Yes How Accessible: Accessible via walker Home Equipment: Shower seat - built in   Additional Comments: pt's daughter lives with him      Prior Functioning/Environment Level of Independence: Independent        Comments: pt was driving, independent with medications, and cooking. daughter was completing grocery shopping        OT Problem List: Decreased activity tolerance;Impaired balance (sitting and/or standing);Decreased safety awareness;Decreased knowledge of use of DME or AE;Decreased knowledge of precautions;Pain      OT Treatment/Interventions:      OT Goals(Current goals can be found in the care plan section) Acute Rehab OT Goals Patient Stated Goal: to go home OT Goal Formulation: With patient Time For Goal Achievement: 09/29/20 Potential to Achieve Goals: Good  OT Frequency:     Barriers to D/C:            Co-evaluation              AM-PAC OT "6 Clicks" Daily Activity     Outcome Measure Help from another person eating meals?: None Help from another person taking care of personal grooming?: A Little Help from another person toileting, which includes using toliet, bedpan, or urinal?: A Little Help from another person bathing (including washing, rinsing, drying)?: A Little Help from another person to put on and taking off regular upper body clothing?: A Little Help from another person to put on and taking off regular lower body  clothing?: A Little 6 Click Score: 19   End of Session Equipment Utilized During Treatment: Rolling walker;Back brace Nurse Communication: Mobility status  Activity Tolerance: Patient tolerated treatment well Patient left: in bed;with call bell/phone within reach;with bed alarm  set;with family/visitor present  OT Visit Diagnosis: Other abnormalities of gait and mobility (R26.89);Muscle weakness (generalized) (M62.81);Pain Pain - part of body:  (back)                Time: 3244-0102 OT Time Calculation (min): 46 min Charges:  OT General Charges $OT Visit: 1 Visit OT Evaluation $OT Eval Low Complexity: 1 Low OT Treatments $Self Care/Home Management : 23-37 mins  Helene Kelp OTR/L Acute Rehabilitation Services Office: [phone]   Wyn Forster 09/15/2020, 11:28 AM

## 2020-09-15 NOTE — Discharge Instructions (Addendum)
Wound Care Keep incision covered and dry for two days.    Do not put any creams, lotions, or ointments on incision. Leave steri-strips on back.  They will fall off by themselves. Activity Walk each and every day, increasing distance each day. No lifting greater than 5 lbs. No Bending, Lifting and Twisting No driving for 2 weeks; may ride as a passenger locally. Diet Resume your normal diet.  Return to Work Will be discussed at your follow up appointment. Call Your Doctor If Any of These Occur Redness, drainage, or swelling at the wound.  Temperature greater than 101 degrees. Severe pain not relieved by pain medication. Incision starts to come apart. Follow Up Appt Call today for appointment in 1-2 weeks [phone]) or for problems.

## 2020-09-15 NOTE — Plan of Care (Signed)
Patient alert and oriented, voiding adequately, no c/o pain at this time. Patient discharged home per order. Patient and daughter stated understanding of instructions given. Patient has an appointment with Dr. Annette Stable in 2 weeks

## 2020-09-15 NOTE — Discharge Summary (Signed)
Physician Discharge Summary  Patient ID: Brett [name] MRN: [id number] DOB/AGE: Jan 11, 1956 65 y.o.  Admit date: 09/14/2020 Discharge date: 09/15/2020  Admission Diagnoses: Spondylolisthesis at L5-S1  Discharge Diagnoses:  Active Problems:   Spondylolisthesis at L5-S1 level   Discharged Condition: good  Hospital Course: Patient underwent an L5-S1 decompression and fusion by Dr. Annette Stable on 09/14/2020. He was admitted to 3C06 following recovery from anesthesia in the PACU. His postoperative course has been uncomplicated. He has worked with both physical and occupational therapies who feel he is ready for discharge home. He is ambulating independently and without difficulty. He is tolerating a normal diet. He is not having any bowel or bladder dysfunction. His pain is well-controlled with oral pain medication. He is ready for discharge home.   Consults: rehabilitation medicine  Significant Diagnostic Studies: radiology: DG Lumbar Spine 2-3 Views  Result Date: 09/14/2020 CLINICAL DATA:  Elective surgery. Additional history provided: Posterior lumbar interbody fusion lumbar 4-lumbar 5-lumbar 5-sacral 1. Provided fluoroscopy time 49.9 seconds (24.33 mGy). EXAM: LUMBAR SPINE - 2-3 VIEW; DG C-ARM 1-60 MIN COMPARISON:  Lumbar spine MRI 04/28/2020. FINDINGS: PA and lateral view intraoperative fluoroscopic images of the lumbosacral spine are submitted, 2 images total. The lowest well-formed intervertebral disc space is presumed L5-S1. The images demonstrate bilateral pedicle screws at the L4, L5 and S1 levels. Vertical interconnecting rods were not present at the time the images were taken. Interbody devices are also present at the L4-L5 and L5-S1 levels. Overlying retractors. IMPRESSION: Two intraoperative fluoroscopic images of the lumbosacral spine, as described. Electronically Signed   By: Kellie Simmering DO   On: 09/14/2020 11:22   DG C-Arm 1-60 Min  Result Date: 09/14/2020 CLINICAL DATA:  Elective  surgery. Additional history provided: Posterior lumbar interbody fusion lumbar 4-lumbar 5-lumbar 5-sacral 1. Provided fluoroscopy time 49.9 seconds (24.33 mGy). EXAM: LUMBAR SPINE - 2-3 VIEW; DG C-ARM 1-60 MIN COMPARISON:  Lumbar spine MRI 04/28/2020. FINDINGS: PA and lateral view intraoperative fluoroscopic images of the lumbosacral spine are submitted, 2 images total. The lowest well-formed intervertebral disc space is presumed L5-S1. The images demonstrate bilateral pedicle screws at the L4, L5 and S1 levels. Vertical interconnecting rods were not present at the time the images were taken. Interbody devices are also present at the L4-L5 and L5-S1 levels. Overlying retractors. IMPRESSION: Two intraoperative fluoroscopic images of the lumbosacral spine, as described. Electronically Signed   By: Kellie Simmering DO   On: 09/14/2020 11:22     Treatments: surgery:   Bilateral L4-5 decompressive laminotomies with foraminotomies, more than would be required for simple interbody fusion alone.  L5S1 Gill procedure with bilateral L5-S1 decompressive foraminotomies  L4-L5 ponte osteotomies for sagittal plane restoration  L4-5, L5-S1 posterior lumbar interbody fusion utilizing interbody cages and locally harvested autograft and morselized allograft  L4-5 S1 posterior lateral arthrodesis utilizing segmental pedicle screw fixation and local autograft  Discharge Exam: Blood pressure 126/73, pulse 94, temperature 97.7 F (36.5 C), temperature source Oral, resp. rate 18, height 6\' 2"  (1.88 m), weight 72.3 kg, SpO2 93 %.  Alert and oriented x 4 PERRLA CN II-XII grossly intact MAE, Strength and sensation intact Incision is covered with Honeycomb dressing and Steri Strips; Dressing is clean, dry, and intact  Disposition: Discharge disposition: 01-Home or Self Care        Allergies as of 09/15/2020   No Known Allergies     Medication List    STOP taking these medications   ibuprofen 200 MG  tablet Commonly known  as: ADVIL     TAKE these medications   acetaminophen 325 MG tablet Commonly known as: TYLENOL Take 2 tablets (650 mg total) by mouth every 4 (four) hours as needed for mild pain ((score 1 to 3) or temp > 100.5).   b complex vitamins capsule Take 1 capsule by mouth daily.   diazepam 5 MG tablet Commonly known as: VALIUM Take 1 tablet (5 mg total) by mouth every 6 (six) hours as needed for muscle spasms.   docusate sodium 100 MG capsule Commonly known as: Colace Take 1 capsule (100 mg total) by mouth 2 (two) times daily.   ferrous sulfate 325 (65 FE) MG tablet Take 325 mg by mouth daily.   FISH OIL PO Take 1 capsule by mouth daily.   fluticasone 50 MCG/ACT nasal spray Commonly known as: FLONASE Place 2 sprays into both nostrils daily.   multivitamin tablet Take 1 tablet by mouth daily.   Oxycodone HCl 10 MG Tabs Take 1 tablet (10 mg total) by mouth every 4 (four) hours as needed for severe pain ((score 7 to 10)).   Travatan Z 0.004 % Soln ophthalmic solution Generic drug: Travoprost (BAK Free) Place 1 drop into both eyes daily.   vitamin C 1000 MG tablet Take 1,000 mg by mouth daily.            Durable Medical Equipment  (From admission, onward)         Start     Ordered   09/14/20 1219  DME Walker rolling  Once       Question:  Patient needs a walker to treat with the following condition  Answer:  Spondylolisthesis at L5-S1 level   09/14/20 1218   09/14/20 1219  DME 3 n 1  Once        09/14/20 1218          Follow-up Information    Earnie Larsson, MD. Schedule an appointment as soon as possible for a visit in 2 week(s).   Specialty: Neurosurgery Contact information: [address] [place] [address] [place] [address] [phone]               Signed: Patricia Nettle 09/15/2020, 9:40 AM

## 2020-09-16 ENCOUNTER — Encounter (HOSPITAL_COMMUNITY): Payer: Self-pay | Admitting: Neurosurgery

## 2020-09-16 MED FILL — Sodium Chloride IV Soln 0.9%: INTRAVENOUS | Qty: 1000 | Status: AC

## 2020-09-16 MED FILL — Heparin Sodium (Porcine) Inj 1000 Unit/ML: INTRAMUSCULAR | Qty: 30 | Status: AC

## 2020-09-23 DIAGNOSIS — M4317 Spondylolisthesis, lumbosacral region: Secondary | ICD-10-CM | POA: Diagnosis not present

## 2020-09-23 DIAGNOSIS — Z9181 History of falling: Secondary | ICD-10-CM | POA: Diagnosis not present

## 2020-09-23 DIAGNOSIS — Z4789 Encounter for other orthopedic aftercare: Secondary | ICD-10-CM | POA: Diagnosis not present

## 2020-09-23 DIAGNOSIS — Z7951 Long term (current) use of inhaled steroids: Secondary | ICD-10-CM | POA: Diagnosis not present

## 2020-09-23 DIAGNOSIS — M5136 Other intervertebral disc degeneration, lumbar region: Secondary | ICD-10-CM | POA: Diagnosis not present

## 2020-09-23 DIAGNOSIS — I10 Essential (primary) hypertension: Secondary | ICD-10-CM | POA: Diagnosis not present

## 2020-09-23 DIAGNOSIS — M9983 Other biomechanical lesions of lumbar region: Secondary | ICD-10-CM | POA: Diagnosis not present

## 2020-09-23 DIAGNOSIS — Z981 Arthrodesis status: Secondary | ICD-10-CM | POA: Diagnosis not present

## 2020-09-26 DIAGNOSIS — Z7951 Long term (current) use of inhaled steroids: Secondary | ICD-10-CM | POA: Diagnosis not present

## 2020-09-26 DIAGNOSIS — Z9181 History of falling: Secondary | ICD-10-CM | POA: Diagnosis not present

## 2020-09-26 DIAGNOSIS — M9983 Other biomechanical lesions of lumbar region: Secondary | ICD-10-CM | POA: Diagnosis not present

## 2020-09-26 DIAGNOSIS — M4317 Spondylolisthesis, lumbosacral region: Secondary | ICD-10-CM | POA: Diagnosis not present

## 2020-09-26 DIAGNOSIS — M5136 Other intervertebral disc degeneration, lumbar region: Secondary | ICD-10-CM | POA: Diagnosis not present

## 2020-09-26 DIAGNOSIS — I10 Essential (primary) hypertension: Secondary | ICD-10-CM | POA: Diagnosis not present

## 2020-09-26 DIAGNOSIS — Z4789 Encounter for other orthopedic aftercare: Secondary | ICD-10-CM | POA: Diagnosis not present

## 2020-09-26 DIAGNOSIS — Z981 Arthrodesis status: Secondary | ICD-10-CM | POA: Diagnosis not present

## 2020-09-29 DIAGNOSIS — M9983 Other biomechanical lesions of lumbar region: Secondary | ICD-10-CM | POA: Diagnosis not present

## 2020-09-29 DIAGNOSIS — Z4789 Encounter for other orthopedic aftercare: Secondary | ICD-10-CM | POA: Diagnosis not present

## 2020-09-29 DIAGNOSIS — I10 Essential (primary) hypertension: Secondary | ICD-10-CM | POA: Diagnosis not present

## 2020-09-29 DIAGNOSIS — Z9181 History of falling: Secondary | ICD-10-CM | POA: Diagnosis not present

## 2020-09-29 DIAGNOSIS — M5136 Other intervertebral disc degeneration, lumbar region: Secondary | ICD-10-CM | POA: Diagnosis not present

## 2020-09-29 DIAGNOSIS — Z981 Arthrodesis status: Secondary | ICD-10-CM | POA: Diagnosis not present

## 2020-09-29 DIAGNOSIS — Z7951 Long term (current) use of inhaled steroids: Secondary | ICD-10-CM | POA: Diagnosis not present

## 2020-09-29 DIAGNOSIS — M4317 Spondylolisthesis, lumbosacral region: Secondary | ICD-10-CM | POA: Diagnosis not present

## 2020-10-01 DIAGNOSIS — Z9181 History of falling: Secondary | ICD-10-CM | POA: Diagnosis not present

## 2020-10-01 DIAGNOSIS — Z981 Arthrodesis status: Secondary | ICD-10-CM | POA: Diagnosis not present

## 2020-10-01 DIAGNOSIS — M9983 Other biomechanical lesions of lumbar region: Secondary | ICD-10-CM | POA: Diagnosis not present

## 2020-10-01 DIAGNOSIS — M5136 Other intervertebral disc degeneration, lumbar region: Secondary | ICD-10-CM | POA: Diagnosis not present

## 2020-10-01 DIAGNOSIS — Z7951 Long term (current) use of inhaled steroids: Secondary | ICD-10-CM | POA: Diagnosis not present

## 2020-10-01 DIAGNOSIS — I10 Essential (primary) hypertension: Secondary | ICD-10-CM | POA: Diagnosis not present

## 2020-10-01 DIAGNOSIS — M4317 Spondylolisthesis, lumbosacral region: Secondary | ICD-10-CM | POA: Diagnosis not present

## 2020-10-01 DIAGNOSIS — Z4789 Encounter for other orthopedic aftercare: Secondary | ICD-10-CM | POA: Diagnosis not present

## 2020-10-06 DIAGNOSIS — Z981 Arthrodesis status: Secondary | ICD-10-CM | POA: Diagnosis not present

## 2020-10-06 DIAGNOSIS — Z4789 Encounter for other orthopedic aftercare: Secondary | ICD-10-CM | POA: Diagnosis not present

## 2020-10-06 DIAGNOSIS — M5136 Other intervertebral disc degeneration, lumbar region: Secondary | ICD-10-CM | POA: Diagnosis not present

## 2020-10-06 DIAGNOSIS — M4317 Spondylolisthesis, lumbosacral region: Secondary | ICD-10-CM | POA: Diagnosis not present

## 2020-10-06 DIAGNOSIS — Z9181 History of falling: Secondary | ICD-10-CM | POA: Diagnosis not present

## 2020-10-06 DIAGNOSIS — I10 Essential (primary) hypertension: Secondary | ICD-10-CM | POA: Diagnosis not present

## 2020-10-06 DIAGNOSIS — M9983 Other biomechanical lesions of lumbar region: Secondary | ICD-10-CM | POA: Diagnosis not present

## 2020-10-06 DIAGNOSIS — Z7951 Long term (current) use of inhaled steroids: Secondary | ICD-10-CM | POA: Diagnosis not present

## 2020-10-07 DIAGNOSIS — M4317 Spondylolisthesis, lumbosacral region: Secondary | ICD-10-CM | POA: Diagnosis not present

## 2020-10-08 DIAGNOSIS — Z981 Arthrodesis status: Secondary | ICD-10-CM | POA: Diagnosis not present

## 2020-10-08 DIAGNOSIS — I10 Essential (primary) hypertension: Secondary | ICD-10-CM | POA: Diagnosis not present

## 2020-10-08 DIAGNOSIS — Z9181 History of falling: Secondary | ICD-10-CM | POA: Diagnosis not present

## 2020-10-08 DIAGNOSIS — Z7951 Long term (current) use of inhaled steroids: Secondary | ICD-10-CM | POA: Diagnosis not present

## 2020-10-08 DIAGNOSIS — Z4789 Encounter for other orthopedic aftercare: Secondary | ICD-10-CM | POA: Diagnosis not present

## 2020-10-08 DIAGNOSIS — M9983 Other biomechanical lesions of lumbar region: Secondary | ICD-10-CM | POA: Diagnosis not present

## 2020-10-08 DIAGNOSIS — M5136 Other intervertebral disc degeneration, lumbar region: Secondary | ICD-10-CM | POA: Diagnosis not present

## 2020-10-08 DIAGNOSIS — M4317 Spondylolisthesis, lumbosacral region: Secondary | ICD-10-CM | POA: Diagnosis not present

## 2020-10-18 DIAGNOSIS — M4316 Spondylolisthesis, lumbar region: Secondary | ICD-10-CM | POA: Diagnosis not present

## 2020-10-26 DIAGNOSIS — H401133 Primary open-angle glaucoma, bilateral, severe stage: Secondary | ICD-10-CM | POA: Diagnosis not present

## 2020-10-29 DIAGNOSIS — Z833 Family history of diabetes mellitus: Secondary | ICD-10-CM | POA: Diagnosis not present

## 2020-10-29 DIAGNOSIS — Z96649 Presence of unspecified artificial hip joint: Secondary | ICD-10-CM | POA: Diagnosis not present

## 2020-10-29 DIAGNOSIS — G629 Polyneuropathy, unspecified: Secondary | ICD-10-CM | POA: Diagnosis not present

## 2020-10-29 DIAGNOSIS — R030 Elevated blood-pressure reading, without diagnosis of hypertension: Secondary | ICD-10-CM | POA: Diagnosis not present

## 2020-10-29 DIAGNOSIS — Z809 Family history of malignant neoplasm, unspecified: Secondary | ICD-10-CM | POA: Diagnosis not present

## 2020-10-29 DIAGNOSIS — M461 Sacroiliitis, not elsewhere classified: Secondary | ICD-10-CM | POA: Diagnosis not present

## 2020-10-29 DIAGNOSIS — M545 Low back pain, unspecified: Secondary | ICD-10-CM | POA: Diagnosis not present

## 2020-10-29 DIAGNOSIS — R69 Illness, unspecified: Secondary | ICD-10-CM | POA: Diagnosis not present

## 2020-10-29 DIAGNOSIS — Z791 Long term (current) use of non-steroidal anti-inflammatories (NSAID): Secondary | ICD-10-CM | POA: Diagnosis not present

## 2020-10-29 DIAGNOSIS — H409 Unspecified glaucoma: Secondary | ICD-10-CM | POA: Diagnosis not present

## 2020-11-04 DIAGNOSIS — M4317 Spondylolisthesis, lumbosacral region: Secondary | ICD-10-CM | POA: Diagnosis not present

## 2020-12-02 DIAGNOSIS — M4317 Spondylolisthesis, lumbosacral region: Secondary | ICD-10-CM | POA: Diagnosis not present

## 2020-12-02 DIAGNOSIS — I10 Essential (primary) hypertension: Secondary | ICD-10-CM | POA: Diagnosis not present

## 2020-12-29 ENCOUNTER — Ambulatory Visit: Payer: Medicare HMO | Attending: Internal Medicine

## 2020-12-29 ENCOUNTER — Other Ambulatory Visit (HOSPITAL_BASED_OUTPATIENT_CLINIC_OR_DEPARTMENT_OTHER): Payer: Self-pay

## 2020-12-29 ENCOUNTER — Other Ambulatory Visit: Payer: Self-pay

## 2020-12-29 DIAGNOSIS — Z23 Encounter for immunization: Secondary | ICD-10-CM

## 2020-12-29 MED ORDER — PFIZER-BIONT COVID-19 VAC-TRIS 30 MCG/0.3ML IM SUSP
INTRAMUSCULAR | 0 refills | Status: DC
  Filled 2020-12-29: qty 0.3, 1d supply, fill #0

## 2020-12-29 NOTE — Progress Notes (Signed)
   Covid-19 Vaccination Clinic  Name:  DASHAN CHIZMAR    MRN: [id number] DOB: 11-12-1955  12/29/2020  Mr. Ruffini was observed post Covid-19 immunization for 15 minutes without incident. He was provided with Vaccine Information Sheet and instruction to access the V-Safe system.   Mr. Higinbotham was instructed to call 911 with any severe reactions post vaccine: [name] Difficulty breathing  . Swelling of face and throat  . A fast heartbeat  . A bad rash all over body  . Dizziness and weakness   Immunizations Administered    Name Date Dose VIS Date Route   PFIZER Comrnaty(Gray TOP) Covid-19 Vaccine 12/29/2020  9:47 AM 0.3 mL 07/16/2020 Intramuscular   Manufacturer: [employer]   Lot: [id number]   NDC: [phone]

## 2021-02-01 ENCOUNTER — Telehealth: Payer: Self-pay | Admitting: Internal Medicine

## 2021-02-01 NOTE — Telephone Encounter (Signed)
  pt caling wants to schedule appt sooner than 09/22. Pt wants to make sure its not affecting his 93mth recall.  Plz advise thanks

## 2021-02-01 NOTE — Telephone Encounter (Signed)
Lm on vm for patient to return call 

## 2021-02-03 DIAGNOSIS — M4317 Spondylolisthesis, lumbosacral region: Secondary | ICD-10-CM | POA: Diagnosis not present

## 2021-02-03 NOTE — Telephone Encounter (Signed)
Pt returned call and was scheduled for his recall colon. He had no further concerns.

## 2021-02-16 ENCOUNTER — Other Ambulatory Visit: Payer: Self-pay

## 2021-02-16 ENCOUNTER — Ambulatory Visit (AMBULATORY_SURGERY_CENTER): Payer: Medicare HMO

## 2021-02-16 VITALS — Ht 74.0 in | Wt 164.0 lb

## 2021-02-16 DIAGNOSIS — Z86010 Personal history of colonic polyps: Secondary | ICD-10-CM

## 2021-02-16 DIAGNOSIS — Z800 Family history of malignant neoplasm of digestive organs: Secondary | ICD-10-CM

## 2021-02-16 MED ORDER — PEG 3350-KCL-NA BICARB-NACL 420 G PO SOLR: 4000.0000 mL | Freq: Once | ORAL | 0 refills | Status: AC

## 2021-02-16 NOTE — Progress Notes (Signed)
Patient is here in-person for PV. Patient denies any allergies to eggs or soy. Patient denies any problems with anesthesia/sedation. Patient denies any oxygen use at home. Patient denies taking any diet/weight loss medications or blood thinners. Patient is not being treated for MRSA or C-diff. Patient is aware of our care-partner policy and OTLXB-26 safety protocol. EMMI education assigned to the patient for the procedure, sent to [facility].   Patient is COVID-19 vaccinated, per patient.   Pt is aware that his diet restrictions are to begin today.  Also he was notified to hold IRON until after his colonoscopy. maw

## 2021-02-18 ENCOUNTER — Other Ambulatory Visit: Payer: Self-pay

## 2021-02-18 ENCOUNTER — Encounter: Payer: Self-pay | Admitting: Internal Medicine

## 2021-02-18 ENCOUNTER — Ambulatory Visit (AMBULATORY_SURGERY_CENTER): Payer: Medicare HMO | Admitting: Internal Medicine

## 2021-02-18 VITALS — BP 120/64 | HR 86 | Temp 97.8°F | Resp 16 | Ht 74.0 in | Wt 164.0 lb

## 2021-02-18 DIAGNOSIS — Z800 Family history of malignant neoplasm of digestive organs: Secondary | ICD-10-CM | POA: Diagnosis not present

## 2021-02-18 DIAGNOSIS — K5730 Diverticulosis of large intestine without perforation or abscess without bleeding: Secondary | ICD-10-CM | POA: Diagnosis not present

## 2021-02-18 DIAGNOSIS — Z86010 Personal history of colonic polyps: Secondary | ICD-10-CM | POA: Diagnosis not present

## 2021-02-18 MED ORDER — SODIUM CHLORIDE 0.9 % IV SOLN: 500.0000 mL | INTRAVENOUS | Status: DC

## 2021-02-18 NOTE — Progress Notes (Signed)
Report given to PACU, vss 

## 2021-02-18 NOTE — Op Note (Signed)
[facility] Patient Name: Brett [name] Procedure Date: 02/18/2021 8:14 AM MRN: [id number] Endoscopist: Jerene Bears , MD Age: 65 Referring MD:  Date of Birth: 12/05/1955 Gender: Male Account #: [id number] Procedure:                Colonoscopy Indications:              Follow-up for history of adenomatous polyps in the                            colon (15 mm polyp near diverticulum in ascending                            colon removed in Dec 2021); personal history of                            adenomatous and SSPs, family hx of colon cancer in                            1st degree relative Medicines:                Monitored Anesthesia Care Procedure:                Pre-Anesthesia Assessment:                           - Prior to the procedure, a History and Physical                            was performed, and patient medications and                            allergies were reviewed. The patient's tolerance of                            previous anesthesia was also reviewed. The risks                            and benefits of the procedure and the sedation                            options and risks were discussed with the patient.                            All questions were answered, and informed consent                            was obtained. Prior Anticoagulants: The patient has                            taken no previous anticoagulant or antiplatelet                            agents. ASA Grade Assessment: II - A patient with  mild systemic disease. After reviewing the risks                            and benefits, the patient was deemed in                            satisfactory condition to undergo the procedure.                           After obtaining informed consent, the colonoscope                            was passed under direct vision. Throughout the                            procedure, the patient's blood pressure,  pulse, and                            oxygen saturations were monitored continuously. The                            CF [id number] #[id number] was introduced through the anus                            and advanced to the cecum, identified by                            appendiceal orifice and ileocecal valve. The                            colonoscopy was performed without difficulty. The                            patient tolerated the procedure well. The quality                            of the bowel preparation was good. The ileocecal                            valve, appendiceal orifice, and rectum were                            photographed. Scope In: 8:24:40 AM Scope Out: 8:41:46 AM Scope Withdrawal Time: 0 hours 13 minutes 20 seconds  Total Procedure Duration: 0 hours 17 minutes 6 seconds  Findings:                 The digital rectal exam was normal.                           A tattoo was seen in the proximal ascending colon.                            A post-polypectomy scar was found at the tattoo  site. There was no evidence of residual polyp                            tissue.                           Multiple small and large-mouthed diverticula were                            found in the sigmoid colon, descending colon and                            ascending colon.                           Internal hemorrhoids were found during                            retroflexion. The hemorrhoids were small. Complications:            No immediate complications. Estimated Blood Loss:     Estimated blood loss: none. Impression:               - A tattoo was seen in the proximal ascending                            colon. A post-polypectomy scar was found at the                            tattoo site. There was no evidence of residual                            polyp tissue.                           - Diverticulosis in the sigmoid colon, in the                             descending colon and in the ascending colon.                           - Small internal hemorrhoids.                           - No specimens collected. Recommendation:           - Patient has a contact number available for                            emergencies. The signs and symptoms of potential                            delayed complications were discussed with the                            patient. Return to normal activities tomorrow.  Written discharge instructions were provided to the                            patient.                           - Resume previous diet.                           - Continue present medications.                           - Repeat colonoscopy in 3 years for surveillance.                           - Refer to a [facility] surgeon at appointment to be                            scheduled to evaluate symptomatic left inguinal                            hernia. Jerene Bears, MD 02/18/2021 8:48:02 AM This report has been signed electronically.

## 2021-02-18 NOTE — Patient Instructions (Signed)
YOU HAD AN ENDOSCOPIC PROCEDURE TODAY AT THE [facility]:   Refer to the procedure report that was given to you for any specific questions about what was found during the examination.  If the procedure report does not answer your questions, please call your gastroenterologist to clarify.  If you requested that your care partner not be given the details of your procedure findings, then the procedure report has been included in a sealed envelope for you to review at your convenience later.  YOU SHOULD EXPECT: Some feelings of bloating in the abdomen. Passage of more gas than usual.  Walking can help get rid of the air that was put into your GI tract during the procedure and reduce the bloating. If you had a lower endoscopy (such as a colonoscopy or flexible sigmoidoscopy) you may notice spotting of blood in your stool or on the toilet paper. If you underwent a bowel prep for your procedure, you may not have a normal bowel movement for a few days.  Please Note:  You might notice some irritation and congestion in your nose or some drainage.  This is from the oxygen used during your procedure.  There is no need for concern and it should clear up in a day or so.  SYMPTOMS TO REPORT IMMEDIATELY:   Following lower endoscopy (colonoscopy or flexible sigmoidoscopy):  Excessive amounts of blood in the stool  Significant tenderness or worsening of abdominal pains  Swelling of the abdomen that is new, acute  Fever of 100F or higher  For urgent or emergent issues, a gastroenterologist can be reached at any hour by calling [phone]. Do not use MyChart messaging for urgent concerns.    DIET:  We do recommend a small meal at first, but then you may proceed to your regular diet.  Drink plenty of fluids but you should avoid alcoholic beverages for 24 hours.  ACTIVITY:  You should plan to take it easy for the rest of today and you should NOT DRIVE or use heavy machinery until tomorrow (because  of the sedation medicines used during the test).    FOLLOW UP: Our staff will call the number listed on your records 48-72 hours following your procedure to check on you and address any questions or concerns that you may have regarding the information given to you following your procedure. If we do not reach you, we will leave a message.  We will attempt to reach you two times.  During this call, we will ask if you have developed any symptoms of COVID 19. If you develop any symptoms (ie: fever, flu-like symptoms, shortness of breath, cough etc.) before then, please call [phone].  If you test positive for Covid 19 in the 2 weeks post procedure, please call and report this information to us.    If any biopsies were taken you will be contacted by phone or by letter within the next 1-3 weeks.  Please call us at [phone] if you have not heard about the biopsies in 3 weeks.    SIGNATURES/CONFIDENTIALITY: You and/or your care partner have signed paperwork which will be entered into your electronic medical record.  These signatures attest to the fact that that the information above on your After Visit Summary has been reviewed and is understood.  Full responsibility of the confidentiality of this discharge information lies with you and/or your care-partner. 

## 2021-02-18 NOTE — Progress Notes (Signed)
Pt's states no medical or surgical changes since previsit or office visit. VS by CW. 

## 2021-02-19 ENCOUNTER — Telehealth: Payer: Self-pay

## 2021-02-19 NOTE — Telephone Encounter (Signed)
Referral faxed to [facility] for eval of symptomatic L inguinal hernia.

## 2021-02-22 ENCOUNTER — Telehealth: Payer: Self-pay

## 2021-02-22 NOTE — Telephone Encounter (Signed)
Left voice message.

## 2021-03-04 ENCOUNTER — Other Ambulatory Visit (INDEPENDENT_AMBULATORY_CARE_PROVIDER_SITE_OTHER): Payer: Self-pay | Admitting: Otolaryngology

## 2021-03-30 ENCOUNTER — Ambulatory Visit: Payer: Self-pay | Admitting: Surgery

## 2021-03-30 DIAGNOSIS — Z720 Tobacco use: Secondary | ICD-10-CM | POA: Diagnosis not present

## 2021-03-30 DIAGNOSIS — M545 Low back pain, unspecified: Secondary | ICD-10-CM | POA: Diagnosis not present

## 2021-03-30 DIAGNOSIS — G8929 Other chronic pain: Secondary | ICD-10-CM | POA: Diagnosis not present

## 2021-03-30 DIAGNOSIS — Z8371 Family history of colonic polyps: Secondary | ICD-10-CM | POA: Diagnosis not present

## 2021-03-30 DIAGNOSIS — Z86010 Personal history of colonic polyps: Secondary | ICD-10-CM | POA: Diagnosis not present

## 2021-03-30 DIAGNOSIS — K4090 Unilateral inguinal hernia, without obstruction or gangrene, not specified as recurrent: Secondary | ICD-10-CM | POA: Diagnosis not present

## 2021-03-30 DIAGNOSIS — M25551 Pain in right hip: Secondary | ICD-10-CM | POA: Diagnosis not present

## 2021-04-26 ENCOUNTER — Other Ambulatory Visit (HOSPITAL_BASED_OUTPATIENT_CLINIC_OR_DEPARTMENT_OTHER): Payer: Self-pay

## 2021-04-26 ENCOUNTER — Other Ambulatory Visit: Payer: Self-pay

## 2021-04-26 ENCOUNTER — Ambulatory Visit (INDEPENDENT_AMBULATORY_CARE_PROVIDER_SITE_OTHER): Payer: Medicare HMO | Admitting: Family

## 2021-04-26 ENCOUNTER — Encounter: Payer: Self-pay | Admitting: Family

## 2021-04-26 VITALS — BP 122/55 | HR 78 | Temp 98.3°F | Resp 16 | Ht 74.0 in | Wt 158.0 lb

## 2021-04-26 DIAGNOSIS — D7589 Other specified diseases of blood and blood-forming organs: Secondary | ICD-10-CM | POA: Diagnosis not present

## 2021-04-26 DIAGNOSIS — R739 Hyperglycemia, unspecified: Secondary | ICD-10-CM | POA: Diagnosis not present

## 2021-04-26 DIAGNOSIS — Z125 Encounter for screening for malignant neoplasm of prostate: Secondary | ICD-10-CM | POA: Diagnosis not present

## 2021-04-26 DIAGNOSIS — Z0000 Encounter for general adult medical examination without abnormal findings: Secondary | ICD-10-CM | POA: Diagnosis not present

## 2021-04-26 DIAGNOSIS — Z23 Encounter for immunization: Secondary | ICD-10-CM | POA: Diagnosis not present

## 2021-04-26 DIAGNOSIS — Z720 Tobacco use: Secondary | ICD-10-CM

## 2021-04-26 LAB — CBC WITH DIFFERENTIAL/PLATELET
Basophils Absolute: 0.1 10*3/uL (ref 0.0–0.1)
Basophils Relative: 1.4 % (ref 0.0–3.0)
Eosinophils Absolute: 0.3 10*3/uL (ref 0.0–0.7)
Eosinophils Relative: 4.2 % (ref 0.0–5.0)
HCT: 45.6 % (ref 39.0–52.0)
Hemoglobin: 15.2 g/dL (ref 13.0–17.0)
Lymphocytes Relative: 22.8 % (ref 12.0–46.0)
Lymphs Abs: 1.6 10*3/uL (ref 0.7–4.0)
MCHC: 33.3 g/dL (ref 30.0–36.0)
MCV: 101.2 fl — ABNORMAL HIGH (ref 78.0–100.0)
Monocytes Absolute: 0.9 10*3/uL (ref 0.1–1.0)
Monocytes Relative: 13 % — ABNORMAL HIGH (ref 3.0–12.0)
Neutro Abs: 4 10*3/uL (ref 1.4–7.7)
Neutrophils Relative %: 58.6 % (ref 43.0–77.0)
Platelets: 209 10*3/uL (ref 150.0–400.0)
RBC: 4.51 Mil/uL (ref 4.22–5.81)
RDW: 13.2 % (ref 11.5–15.5)
WBC: 6.8 10*3/uL (ref 4.0–10.5)

## 2021-04-26 LAB — COMPREHENSIVE METABOLIC PANEL
ALT: 17 U/L (ref 0–53)
AST: 24 U/L (ref 0–37)
Albumin: 4.1 g/dL (ref 3.5–5.2)
Alkaline Phosphatase: 46 U/L (ref 39–117)
BUN: 14 mg/dL (ref 6–23)
CO2: 30 mEq/L (ref 19–32)
Calcium: 9.1 mg/dL (ref 8.4–10.5)
Chloride: 101 mEq/L (ref 96–112)
Creatinine, Ser: 0.87 mg/dL (ref 0.40–1.50)
GFR: 89.71 mL/min (ref >60.00)
Glucose, Bld: 100 mg/dL — ABNORMAL HIGH (ref 70–99)
Potassium: 4.4 mEq/L (ref 3.5–5.1)
Sodium: 140 mEq/L (ref 135–145)
Total Bilirubin: 0.6 mg/dL (ref 0.2–1.2)
Total Protein: 6.7 g/dL (ref 6.0–8.3)

## 2021-04-26 LAB — PSA: PSA: 1.1 ng/mL (ref 0.10–4.00)

## 2021-04-26 MED ORDER — NICOTINE 21 MG/24HR TD PT24
21.0000 mg | MEDICATED_PATCH | Freq: Every day | TRANSDERMAL | 0 refills | Status: DC
  Filled 2021-04-26: qty 42, 42d supply, fill #0

## 2021-04-26 MED ORDER — SILDENAFIL CITRATE 20 MG PO TABS: ORAL_TABLET | ORAL | 1 refills | Status: DC

## 2021-04-26 NOTE — Assessment & Plan Note (Signed)
Pt would like to begin nicoderm patch. Rx sent for 21 mcg patch.

## 2021-04-26 NOTE — Assessment & Plan Note (Signed)
Discussed healthy diet and regular exercise.  Immunizations reviewed. Recommended that he obtain the new covid booster.  Refer for Low Dose Lung Cancer screening.

## 2021-04-26 NOTE — Patient Instructions (Signed)
Please start nicotine patch 21 mcg once daily.  Remove while sleeping.  Call me in 5 weeks to let me know how you are doing and I can send the 14 mcg patch.  Complete lab work prior to leaving.

## 2021-04-26 NOTE — Progress Notes (Signed)
Subjective:     Patient ID: Brett [name], male    DOB: 1955/11/23, 65 y.o.   MRN: [id number]  Chief Complaint  Patient presents with   Annual Exam         HPI Patient is in today for CPX.  Immunizations: flu shot today, Tdap 2016 Diet: reports healthy diet. He started taking ensure to help him gain weight Exercise: not lately, plans to start walking Colonoscopy: 02/18/21 Vision: scheduled on 05/31/21 Dental: up to date Lab Results  Component Value Date   PSA 2.09 04/24/2020   PSA 1.81 04/19/2019   PSA 1.72 04/13/2018   He will be having a hernia repair (inguinal) later this week.    ED- requests refill on sildenafil.     Health Maintenance Due  Topic Date Due   INFLUENZA VACCINE  03/08/2021    Past Medical History:  Diagnosis Date   Allergy    Arthritis    Blood transfusion without reported diagnosis    Chicken pox    Congenital hip deformity    Glaucoma    Neuromuscular disorder ([place]) 11/2019   sciatica    Past Surgical History:  Procedure Laterality Date   COLONOSCOPY  06/17/2015   Pyrtle   COLONOSCOPY  07/20/2020   Pyrtle   HAMMER TOE SURGERY  07/28/2011   Procedure: HAMMER TOE CORRECTION;  Surgeon: Ninetta Lights, MD;  Location: [place];  Service: Orthopedics;  Laterality: Right;  right 2nd and 4th toes correction hammer toe, capsulotomy metatarsal-phalangeal joints   HIP SURGERY  1986 & 2010   rt total hip-8/10-multiple rt hip surgeries- had 4 prior to [place] as a child   lumber fusion  09/2020   POLYPECTOMY     THUMB ARTHROSCOPY  2008   rt    Family History  Problem Relation Age of Onset   Cancer Mother 37       history of colon cancer   Rosacea Mother    Colon cancer Mother 49   Colon polyps Mother    Rosacea Father    Lung disease Father        ?pulmonary fibrosis   Alcohol abuse Brother    Depression Brother    Colon cancer Maternal Grandmother    Endocrine tumor Daughter        pituitary tumor, POTTS    Thyroid disease Son        ?hyperthyroid   Cancer Cousin        colon   Colon cancer Cousin    Esophageal cancer Neg Hx    Rectal cancer Neg Hx    Stomach cancer Neg Hx     Social History   Socioeconomic History   Marital status: Widowed    Spouse name: Not on file   Number of children: Not on file   Years of education: Not on file   Highest education level: Not on file  Occupational History   Not on file  Tobacco Use   Smoking status: Every Day    Packs/day: 1.00    Years: 46.00    Pack years: 46.00    Types: Cigarettes    Start date: 53   Smokeless tobacco: Never   Tobacco comments:    0.5 ppd  Vaping Use   Vaping Use: Never used  Substance and Sexual Activity   Alcohol use: Yes    Alcohol/week: 14.0 standard drinks    Types: 14 Cans of beer per week   Drug use: No  Sexual activity: Not on file  Other Topics Concern   Not on file  Social History Narrative   Quality control Tech- gauges/callibrations.  (Retired)   Some colleg/tech school   wife passed   3 grown children (oldest daughter is living with them) youngest daughter lives in [place].  Curator at [employer].  Son lives near [place]- framing/art.   Social Determinants of Health   Financial Resource Strain: Not on file  Food Insecurity: Not on file  Transportation Needs: Not on file  Physical Activity: Not on file  Stress: Not on file  Social Connections: Not on file  Intimate Partner Violence: Not on file    Outpatient Medications Prior to Visit  Medication Sig Dispense Refill   Ascorbic Acid (VITAMIN C) 1000 MG tablet Take 1,000 mg by mouth daily.     b complex vitamins capsule Take 1 capsule by mouth daily.     gabapentin (NEURONTIN) 300 MG capsule Take 300 mg by mouth 3 (three) times daily.     Multiple Vitamin (MULTIVITAMIN) tablet Take 1 tablet by mouth daily.     Omega-3 Fatty Acids (FISH OIL PO) Take 1 capsule by mouth daily.     TRAVATAN Z 0.004 % SOLN ophthalmic solution  Place 1 drop into both eyes daily.  1   acetaminophen (TYLENOL) 325 MG suppository Place 325 mg rectally every 4 (four) hours as needed. (Patient not taking: Reported on 02/18/2021)     ferrous sulfate 325 (65 FE) MG EC tablet Take 325 mg by mouth 3 (three) times daily with meals.     fluticasone (FLONASE) 50 MCG/ACT nasal spray Place 2 sprays into both nostrils daily. 16 g 1   oxyCODONE 10 MG TABS Take 1 tablet (10 mg total) by mouth every 4 (four) hours as needed for severe pain ((score 7 to 10)). 30 tablet 0   No facility-administered medications prior to visit.    No Known Allergies  Review of Systems  Constitutional:  Negative for weight loss.  HENT:  Positive for congestion (rhinorrhea).   Eyes:  Negative for blurred vision.  Respiratory:  Positive for cough ("smoker's cough").   Cardiovascular:  Negative for chest pain.  Gastrointestinal:  Negative for constipation and diarrhea.  Genitourinary:  Negative for dysuria and frequency.  Musculoskeletal:  Positive for back pain and joint pain (some hip pain).  Skin:  Negative for rash.  Neurological:  Negative for headaches.  Psychiatric/Behavioral:         Denies depression or anxiety      Objective:    Physical Exam  BP (!) 122/55 (BP Location: Left Arm, Patient Position: Sitting, Cuff Size: Small)   Pulse 78   Temp 98.3 F (36.8 C) (Oral)   Resp 16   Ht '6\' 2"'  (1.88 m)   Wt 158 lb (71.7 kg)   SpO2 95%   BMI 20.29 kg/m  Wt Readings from Last 3 Encounters:  04/26/21 158 lb (71.7 kg)  02/18/21 164 lb (74.4 kg)  02/16/21 164 lb (74.4 kg)   Physical Exam  Constitutional: He is oriented to person, place, and time. He appears well-developed and well-nourished. No distress.  HENT:  Head: Normocephalic and atraumatic.  Right Ear: Tympanic membrane and ear canal normal.  Left Ear: Tympanic membrane and ear canal normal.  Mouth/Throat: Not examined, patient wearing mask Eyes: Pupils are equal, round, and reactive to light.  No scleral icterus.  Neck: Normal range of motion. No thyromegaly present.  Cardiovascular: Normal rate and  regular rhythm.   No murmur heard. Pulmonary/Chest: Effort normal and breath sounds normal. No respiratory distress. He has no wheezes. He has no rales. He exhibits no tenderness.  Abdominal: Soft. Bowel sounds are normal. He exhibits no distension and no mass. There is no tenderness. There is no rebound and no guarding.  Musculoskeletal: He exhibits no edema.  Lymphadenopathy:    He has no cervical adenopathy.  Neurological: He is alert and oriented to person, place, and time. He has normal patellar reflexes. He exhibits normal muscle tone. Coordination normal.  Skin: Skin is warm and dry.  Psychiatric: He has a normal mood and affect. His behavior is normal. Judgment and thought content normal.           Assessment & Plan:       Assessment & Plan:   Problem List Items Addressed This Visit       Unprioritized   Tobacco abuse    Pt would like to begin nicoderm patch. Rx sent for 21 mcg patch.       Relevant Orders   CT CHEST LUNG CA SCREEN LOW DOSE W/O CM   Preventative health care    Discussed healthy diet and regular exercise.  Immunizations reviewed. Recommended that he obtain the new covid booster.  Refer for Low Dose Lung Cancer screening.       Relevant Orders   CBC with Differential/Platelet   PSA   Other Visit Diagnoses     Needs flu shot    -  Primary   Relevant Orders   Flu Vaccine QUAD High Dose(Fluad)   Hyperglycemia       Relevant Orders   Comp Met (CMET)   Prostate cancer screening       Relevant Orders   PSA   Macrocytosis       Relevant Orders   CBC with Differential/Platelet       I have discontinued Herbie Baltimore S. Conran's fluticasone, Oxycodone HCl, ferrous sulfate, and acetaminophen. I am also having him start on sildenafil and nicotine. Additionally, I am having him maintain his Travatan Z, multivitamin, b complex vitamins, vitamin  C, Omega-3 Fatty Acids (FISH OIL PO), and gabapentin.  Meds ordered this encounter  Medications   sildenafil (REVATIO) 20 MG tablet    Sig: Take 1-2 tablets by mouth 30 minutes prior to sexual activity.    Dispense:  30 tablet    Refill:  1    Order Specific Question:   Supervising Provider    Answer:   Penni Homans A [[id number]]   nicotine (NICODERM CQ) 21 mg/24hr patch    Sig: Place 1 patch (21 mg total) onto the skin daily.    Dispense:  42 patch    Refill:  0    Order Specific Question:   Supervising Provider    Answer:   Penni Homans A [[id number]]

## 2021-04-28 ENCOUNTER — Other Ambulatory Visit (HOSPITAL_BASED_OUTPATIENT_CLINIC_OR_DEPARTMENT_OTHER): Payer: Self-pay

## 2021-04-28 ENCOUNTER — Ambulatory Visit: Payer: Medicare HMO | Attending: Internal Medicine

## 2021-04-28 DIAGNOSIS — Z23 Encounter for immunization: Secondary | ICD-10-CM

## 2021-04-28 NOTE — Progress Notes (Signed)
   Covid-19 Vaccination Clinic  Name:  Brett [name]    MRN: [id number] DOB: [birth year]  04/28/2021  [name] was observed post Covid-19 immunization for 15 minutes without incident. He was provided with Vaccine Information Sheet and instruction to access the V-Safe system.   [name] was instructed to call 911 with any severe reactions post vaccine: Difficulty breathing  Swelling of face and throat  A fast heartbeat  A bad rash all over body  Dizziness and weakness

## 2021-04-29 DIAGNOSIS — K4190 Unilateral femoral hernia, without obstruction or gangrene, not specified as recurrent: Secondary | ICD-10-CM | POA: Diagnosis not present

## 2021-04-29 DIAGNOSIS — K4020 Bilateral inguinal hernia, without obstruction or gangrene, not specified as recurrent: Secondary | ICD-10-CM | POA: Diagnosis not present

## 2021-05-04 ENCOUNTER — Other Ambulatory Visit (HOSPITAL_BASED_OUTPATIENT_CLINIC_OR_DEPARTMENT_OTHER): Payer: Self-pay

## 2021-05-04 MED ORDER — COVID-19MRNA BIVAL VACC PFIZER 30 MCG/0.3ML IM SUSP
INTRAMUSCULAR | 0 refills | Status: DC
  Filled 2021-05-04: qty 0.3, 1d supply, fill #0

## 2021-05-11 ENCOUNTER — Ambulatory Visit (HOSPITAL_BASED_OUTPATIENT_CLINIC_OR_DEPARTMENT_OTHER)
Admission: RE | Admit: 2021-05-11 | Discharge: 2021-05-11 | Disposition: A | Discharge status: Home / Self Care | Payer: Medicare HMO | Source: Ambulatory Visit | Attending: Family | Admitting: Family

## 2021-05-11 ENCOUNTER — Other Ambulatory Visit: Payer: Self-pay

## 2021-05-11 DIAGNOSIS — I700 Atherosclerosis of aorta: Secondary | ICD-10-CM | POA: Diagnosis not present

## 2021-05-11 DIAGNOSIS — I2510 Atherosclerotic heart disease of native coronary artery without angina pectoris: Secondary | ICD-10-CM | POA: Diagnosis not present

## 2021-05-11 DIAGNOSIS — F17210 Nicotine dependence, cigarettes, uncomplicated: Secondary | ICD-10-CM | POA: Diagnosis not present

## 2021-05-11 DIAGNOSIS — Z122 Encounter for screening for malignant neoplasm of respiratory organs: Secondary | ICD-10-CM | POA: Insufficient documentation

## 2021-05-11 DIAGNOSIS — Z720 Tobacco use: Secondary | ICD-10-CM

## 2021-05-11 DIAGNOSIS — R69 Illness, unspecified: Secondary | ICD-10-CM | POA: Diagnosis not present

## 2021-05-11 DIAGNOSIS — J439 Emphysema, unspecified: Secondary | ICD-10-CM | POA: Diagnosis not present

## 2021-05-31 DIAGNOSIS — Z0100 Encounter for examination of eyes and vision without abnormal findings: Secondary | ICD-10-CM | POA: Diagnosis not present

## 2021-05-31 DIAGNOSIS — H524 Presbyopia: Secondary | ICD-10-CM | POA: Diagnosis not present

## 2021-06-03 DIAGNOSIS — R030 Elevated blood-pressure reading, without diagnosis of hypertension: Secondary | ICD-10-CM | POA: Diagnosis not present

## 2021-06-03 DIAGNOSIS — M4317 Spondylolisthesis, lumbosacral region: Secondary | ICD-10-CM | POA: Diagnosis not present

## 2021-09-01 ENCOUNTER — Telehealth: Payer: Self-pay | Admitting: Family

## 2021-09-01 NOTE — Telephone Encounter (Signed)
Left message for patient to call back and schedule Medicare Annual Wellness Visit (AWV) in office.  ° °If not able to come in office, please offer to do virtually or by telephone.  Left office number and my jabber #[phone]. ° °Due for AWVI ° °Please schedule at anytime with Nurse Health Advisor. °  °

## 2021-10-07 DIAGNOSIS — M4317 Spondylolisthesis, lumbosacral region: Secondary | ICD-10-CM | POA: Diagnosis not present

## 2021-10-16 ENCOUNTER — Telehealth: Payer: Self-pay | Admitting: Family Medicine

## 2021-10-16 MED ORDER — NIRMATRELVIR/RITONAVIR (PAXLOVID)TABLET: 3.0000 | ORAL_TABLET | Freq: Two times a day (BID) | ORAL | 0 refills | Status: AC

## 2021-10-16 NOTE — Telephone Encounter (Signed)
[place] on call ? ?Dealing with fatigue, cough (but tends to be congested as smoker)- some mild right chest discomfort better with cough- started with symptoms today ?Tested positive for covid today ?Smoker and over age 66 ?Taking vitamin C and pushing fluids ? ?Dont use sildenafil while on paxlovid ?Paxlovid as high risk. EUA status discussed ? ?Patient with testing confirming covid 19 with first day of covid 19 symptoms  10/16/21 ?Vaccination status: vaccinated plus has bivalent booster  ? ?Therefore: ?- recommended patient watch closely for shortness of breath or confusion or worsening symptoms and if those occur patient should contact us immediately or seek care in the emergency department ?-recommended patient consider purchasing pulse oximeter and if levels 94% or below persistently- seek care at the hospital ?- Patient needs to self isolate  for at least 5 days since first symptom AND at least 24 hours fever free without fever reducing medications AND have improvement in respiratory symptoms . After 5 days can end self isolation but still needs to wear mask for additional 5 days  ?-Patient should inform close contacts about exposure (anyone patient been around unmasked for more than 15 minutes)  ? ?

## 2021-11-01 ENCOUNTER — Ambulatory Visit (INDEPENDENT_AMBULATORY_CARE_PROVIDER_SITE_OTHER): Payer: Medicare HMO

## 2021-11-01 VITALS — Ht 74.0 in | Wt 162.0 lb

## 2021-11-01 DIAGNOSIS — Z0000 Encounter for general adult medical examination without abnormal findings: Secondary | ICD-10-CM | POA: Diagnosis not present

## 2021-11-01 NOTE — Progress Notes (Signed)
? ?Subjective:  ? Brett [name] is a 66 y.o. male who presents for an Initial Medicare Annual Wellness Visit. ? ?I connected with [name] today by telephone and verified that I am speaking with the correct person using two identifiers. ?Location patient: home ?Location provider: work ?Persons participating in the virtual visit: patient, nurse.  ?  ?I discussed the limitations, risks, security and privacy concerns of performing an evaluation and management service by telephone and the availability of in person appointments. I also discussed with the patient that there may be a patient responsible charge related to this service. The patient expressed understanding and verbally consented to this telephonic visit.  ?  ?Interactive audio and video telecommunications were attempted between this provider and patient, however failed, due to patient having technical difficulties OR patient did not have access to video capability.  We continued and completed visit with audio only. ? ?Some vital signs may be absent or patient reported.  ? ?Time Spent with patient on telephone encounter: 20 minutes ? ? ?Review of Systems    ? ?Cardiac Risk Factors include: advanced age (>45mn, >>1women);male gender;smoking/ tobacco exposure ? ?   ?Objective:  ?  ?Today's Vitals  ? 11/01/21 0831  ?Weight: 162 lb (73.5 kg)  ?Height: '6\' 2"'$  (1.88 m)  ? ?Body mass index is 20.8 kg/m?. ? ? ?  11/01/2021  ?  8:37 AM 02/18/2021  ?  7:46 AM 09/14/2020  ?  2:00 PM 09/10/2020  ?  8:17 AM 06/17/2015  ?  8:00 AM 03/18/2015  ?  3:05 PM 07/27/2011  ?  9:27 AM  ?Advanced Directives  ?Does Patient Have a Medical Advance Directive? Yes No No No Yes Yes Patient does not have advance directive  ?Type of Advance Directive Healthcare Power of Attorney    Living will [place]  ?Copy of [place] Chart? No - copy requested        ?Would patient like information on creating a medical advance directive?  No - Patient declined No  - Patient declined No - Patient declined     ? ? ?Current Medications (verified) ?Outpatient Encounter Medications as of 11/01/2021  ?Medication Sig  ? Ascorbic Acid (VITAMIN C) 1000 MG tablet Take 1,000 mg by mouth daily.  ? b complex vitamins capsule Take 1 capsule by mouth daily.  ? Multiple Vitamin (MULTIVITAMIN) tablet Take 1 tablet by mouth daily.  ? nicotine (NICODERM CQ) 21 mg/24hr patch Place 1 patch (21 mg total) onto the skin daily.  ? Omega-3 Fatty Acids (FISH OIL PO) Take 1 capsule by mouth daily.  ? sildenafil (REVATIO) 20 MG tablet Take 1-2 tablets by mouth 30 minutes prior to sexual activity.  ? TRAVATAN Z 0.004 % SOLN ophthalmic solution Place 1 drop into both eyes daily.  ? COVID-19 mRNA bivalent vaccine, Pfizer, injection Inject into the muscle. (Patient not taking: Reported on 11/01/2021)  ? [DISCONTINUED] gabapentin (NEURONTIN) 300 MG capsule Take 300 mg by mouth 3 (three) times daily.  ? ?No facility-administered encounter medications on file as of 11/01/2021.  ? ? ?Allergies (verified) ?Patient has no known allergies.  ? ?History: ?Past Medical History:  ?Diagnosis Date  ? Allergy   ? Arthritis   ? Blood transfusion without reported diagnosis   ? Chicken pox   ? Congenital hip deformity   ? Glaucoma   ? [place]  ? sciatica  ? ?Past Surgical History:  ?Procedure Laterality Date  ?  COLONOSCOPY  06/17/2015  ? [facility]rtle  ? COLONOSCOPY  07/20/2020  ? Pyrt[facility]  ? HAMMER TOE SURGERY  07/28/2011  ? Procedure: HAMMER TOE CORRECTION;  Surgeon: Ninetta Lights, MD;  Location: [facility];  Service: Orthopedics;  Laterality: Right;  right 2nd and 4th toes correction hammer toe, capsulotomy metatarsal-phalangeal joints  ? HERNIA REPAIR    ? [place] 2010  ? rt total hip-8/10-multiple rt hip surgeries- had 4 prior to 1986 as a child  ? lumber fusion  09/2020  ? POLYPECTOMY    ? THUMB ARTHROSCOPY  2008  ? rt  ? ?Family History  ?Problem Relation Age of Onset  ?  Cancer Mother 104  ?     history of colon cancer  ? Rosacea Mother   ? Colon cancer Mother 50  ? Colon polyps Mother   ? Rosacea Father   ? Lung disease Father   ?     ?pulmonary fibrosis  ? Alcohol abuse Brother   ? Depression Brother   ? Colon cancer Maternal Grandmother   ? Endocrine tumor Daughter   ?     pituitary tumor, POTTS  ? Thyroid disease Son   ?     ?hyperthyroid  ? Cancer Cousin   ?     colon  ? Colon cancer Cousin   ? Esophageal cancer Neg Hx   ? Rectal cancer Neg Hx   ? Stomach cancer Neg Hx   ? ?Social History  ? ?Socioeconomic History  ? Marital status: Widowed  ?  Spouse name: Not on file  ? Number of children: Not on file  ? Years of education: Not on file  ? Highest education level: Not on file  ?Occupational History  ? Not on file  ?Tobacco Use  ? Smoking status: Every Day  ?  Packs/day: 1.00  ?  Years: 46.00  ?  Pack years: 46.00  ?  Types: Cigarettes  ?  Start date: 41  ? Smokeless tobacco: Never  ? Tobacco comments:  ?  0.5 ppd  ?Vaping Use  ? Vaping Use: Never used  ?Substance and Sexual Activity  ? Alcohol use: Yes  ?  Alcohol/week: 14.0 standard drinks  ?  Types: 14 Cans of beer per week  ? Drug use: No  ? Sexual activity: Not on file  ?Other Topics Concern  ? Not on file  ?Social History Narrative  ? Quality control Tech- gauges/callibrations.  (Retired)  ? Some colleg/tech school  ? wife passed  ? 3 grown children (oldest daughter is living with them) youngest daughter lives in [place].  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  ? ?Social Determinants of Health  ? ?Financial Resource Strain: Low Risk   ? Difficulty of Paying Living Expenses: Not hard at all  ?Food Insecurity: No Food Insecurity  ? Worried About Charity fundraiser in the Last Year: Never true  ? Ran Out of Food in the Last Year: Never true  ?Transportation Needs: No Transportation Needs  ? Lack of Transportation (Medical): No  ? Lack of Transportation (Non-Medical): No  ?Physical Activity:  Inactive  ? Days of Exercise per Week: 0 days  ? Minutes of Exercise per Session: 0 min  ?Stress: No Stress Concern Present  ? Feeling of Stress : Not at all  ?Social Connections: Moderately Isolated  ? Frequency of Communication with Friends and Family: More than three times a week  ?  Frequency of Social Gatherings with Friends and Family: Once a week  ? Attends Religious Services: More than 4 times per year  ? Active Member of Clubs or Organizations: No  ? Attends Archivist Meetings: Never  ? Marital Status: Widowed  ? ? ?Tobacco Counseling ?Ready to quit: Not Answered ?Counseling given: Not Answered ?Tobacco comments: 0.5 ppd ? ? ?Clinical Intake: ? ?Pre-visit preparation completed: Yes ? ?Pain : No/denies pain ? ?  ? ?BMI - recorded: 20.8 ?Nutritional Status: BMI of 19-24  Normal ?Nutritional Risks: None ?Diabetes: No ? ?How often do you need to have someone help you when you read instructions, pamphlets, or other written materials from your doctor or pharmacy?: 1 - Never ? ?Diabetic?No ? ?Interpreter Needed?: No ? ?Information entered by :: Caroleen Hamman LPN ? ? ?Activities of Daily Living ? ?  11/01/2021  ?  8:42 AM  ?In your present state of health, do you have any difficulty performing the following activities:  ?Hearing? 0  ?Vision? 0  ?Difficulty concentrating or making decisions? 0  ?Walking or climbing stairs? 0  ?Dressing or bathing? 0  ?Doing errands, shopping? 0  ?Preparing Food and eating ? N  ?Using the Toilet? N  ?In the past six months, have you accidently leaked urine? N  ?Do you have problems with loss of bowel control? N  ?Managing your Medications? N  ?Managing your Finances? N  ?Housekeeping or managing your Housekeeping? N  ? ? ?Patient Care Team: ?Debbrah Alar, NP as PCP - General (Internal Medicine) ? ?Indicate any recent Medical Services you may have received from other than [facility] providers in the past year (date may be approximate). ? ?   ?Assessment:  ? This is a  routine wellness examination for Brett [name]. ? ?Hearing/Vision screen ?Hearing Screening - Comments:: No issues ?Vision Screening - Comments:: Last eye exam-07/2021-Dr.Thurman ? ?Dietary issues and exercise act

## 2021-11-01 NOTE — Patient Instructions (Signed)
Brett [name] , ?Thank you for taking time to come for your Medicare Wellness Visit. I appreciate your ongoing commitment to your health goals. Please review the following plan we discussed and let me know if I can assist you in the future.  ? ?Screening recommendations/referrals: ?Colonoscopy: Completed 02/18/2021-Due 02/19/2024 ?Recommended yearly ophthalmology/optometry visit for glaucoma screening and checkup ?Recommended yearly dental visit for hygiene and checkup ? ?Vaccinations: ?Influenza vaccine: Up to date ?Pneumococcal vaccine: Due-May obtain vaccine at our office or your local pharmacy. ?Tdap vaccine: Up to date ?Shingles vaccine: Completed vaccines   ?Covid-19: Up to date ? ?Advanced directives: Please bring a copy of Living Will and/or Healthcare Power of Attorney for your chart. ? ? ?Conditions/risks identified: See problem list ? ?Next appointment: Follow up in one year for your annual wellness visit. 11/08/2022 @ 8:20 ? ?Preventive Care 59 Years and Older, Male ?Preventive care refers to lifestyle choices and visits with your health care provider that can promote health and wellness. ?What does preventive care include? ?A yearly physical exam. This is also called an annual well check. ?Dental exams once or twice a year. ?Routine eye exams. Ask your health care provider how often you should have your eyes checked. ?Personal lifestyle choices, including: ?Daily care of your teeth and gums. ?Regular physical activity. ?Eating a healthy diet. ?Avoiding tobacco and drug use. ?Limiting alcohol use. ?Practicing safe sex. ?Taking low doses of aspirin every day. ?Taking vitamin and mineral supplements as recommended by your health care provider. ?What happens during an annual well check? ?The services and screenings done by your health care provider during your annual well check will depend on your age, overall health, lifestyle risk factors, and family history of disease. ?Counseling  ?Your health care provider  may ask you questions about your: ?Alcohol use. ?Tobacco use. ?Drug use. ?Emotional well-being. ?Home and relationship well-being. ?Sexual activity. ?Eating habits. ?History of falls. ?Memory and ability to understand (cognition). ?Work and work Statistician. ?Screening  ?You may have the following tests or measurements: ?Height, weight, and BMI. ?Blood pressure. ?Lipid and cholesterol levels. These may be checked every 5 years, or more frequently if you are over 79 years old. ?Skin check. ?Lung cancer screening. You may have this screening every year starting at age 49 if you have a 30-pack-year history of smoking and currently smoke or have quit within the past 15 years. ?Fecal occult blood test (FOBT) of the stool. You may have this test every year starting at age 14. ?Flexible sigmoidoscopy or colonoscopy. You may have a sigmoidoscopy every 5 years or a colonoscopy every 10 years starting at age 77. ?Prostate cancer screening. Recommendations will vary depending on your family history and other risks. ?Hepatitis C blood test. ?Hepatitis B blood test. ?Sexually transmitted disease (STD) testing. ?Diabetes screening. This is done by checking your blood sugar (glucose) after you have not eaten for a while (fasting). You may have this done every 1-3 years. ?Abdominal aortic aneurysm (AAA) screening. You may need this if you are a current or former smoker. ?Osteoporosis. You may be screened starting at age 33 if you are at high risk. ?Talk with your health care provider about your test results, treatment options, and if necessary, the need for more tests. ?Vaccines  ?Your health care provider may recommend certain vaccines, such as: ?Influenza vaccine. This is recommended every year. ?Tetanus, diphtheria, and acellular pertussis (Tdap, Td) vaccine. You may need a Td booster every 10 years. ?Zoster vaccine. You may need this after  age 16. ?Pneumococcal 13-valent conjugate (PCV13) vaccine. One dose is recommended after  age 40. ?Pneumococcal polysaccharide (PPSV23) vaccine. One dose is recommended after age 86. ?Talk to your health care provider about which screenings and vaccines you need and how often you need them. ?This information is not intended to replace advice given to you by your health care provider. Make sure you discuss any questions you have with your health care provider. ?Document Released: 08/21/2015 Document Revised: 04/13/2016 Document Reviewed: 05/26/2015 ?Elsevier Interactive Patient Education ? 2017 [place]. ? ?Fall Prevention in the Home ?Falls can cause injuries. They can happen to people of all ages. There are many things you can do to make your home safe and to help prevent falls. ?What can I do on the outside of my home? ?Regularly fix the edges of walkways and driveways and fix any cracks. ?Remove anything that might make you trip as you walk through a door, such as a raised step or threshold. ?Trim any bushes or trees on the path to your home. ?Use bright outdoor lighting. ?Clear any walking paths of anything that might make someone trip, such as rocks or tools. ?Regularly check to see if handrails are loose or broken. Make sure that both sides of any steps have handrails. ?Any raised decks and porches should have guardrails on the edges. ?Have any leaves, snow, or ice cleared regularly. ?Use sand or salt on walking paths during winter. ?Clean up any spills in your garage right away. This includes oil or grease spills. ?What can I do in the bathroom? ?Use night lights. ?Install grab bars by the toilet and in the tub and shower. Do not use towel bars as grab bars. ?Use non-skid mats or decals in the tub or shower. ?If you need to sit down in the shower, use a plastic, non-slip stool. ?Keep the floor dry. Clean up any water that spills on the floor as soon as it happens. ?Remove soap buildup in the tub or shower regularly. ?Attach bath mats securely with double-sided non-slip rug tape. ?Do not have  throw rugs and other things on the floor that can make you trip. ?What can I do in the bedroom? ?Use night lights. ?Make sure that you have a light by your bed that is easy to reach. ?Do not use any sheets or blankets that are too big for your bed. They should not hang down onto the floor. ?Have a firm chair that has side arms. You can use this for support while you get dressed. ?Do not have throw rugs and other things on the floor that can make you trip. ?What can I do in the kitchen? ?Clean up any spills right away. ?Avoid walking on wet floors. ?Keep items that you use a lot in easy-to-reach places. ?If you need to reach something above you, use a strong step stool that has a grab bar. ?Keep electrical cords out of the way. ?Do not use floor polish or wax that makes floors slippery. If you must use wax, use non-skid floor wax. ?Do not have throw rugs and other things on the floor that can make you trip. ?What can I do with my stairs? ?Do not leave any items on the stairs. ?Make sure that there are handrails on both sides of the stairs and use them. Fix handrails that are broken or loose. Make sure that handrails are as long as the stairways. ?Check any carpeting to make sure that it is firmly attached to the stairs.  Fix any carpet that is loose or worn. ?Avoid having throw rugs at the top or bottom of the stairs. If you do have throw rugs, attach them to the floor with carpet tape. ?Make sure that you have a light switch at the top of the stairs and the bottom of the stairs. If you do not have them, ask someone to add them for you. ?What else can I do to help prevent falls? ?Wear shoes that: ?Do not have high heels. ?Have rubber bottoms. ?Are comfortable and fit you well. ?Are closed at the toe. Do not wear sandals. ?If you use a stepladder: ?Make sure that it is fully opened. Do not climb a closed stepladder. ?Make sure that both sides of the stepladder are locked into place. ?Ask someone to hold it for you, if  possible. ?Clearly mark and make sure that you can see: ?Any grab bars or handrails. ?First and last steps. ?Where the edge of each step is. ?Use tools that help you move around (mobility aids) if they

## 2022-01-14 ENCOUNTER — Telehealth: Payer: Self-pay | Admitting: Family

## 2022-01-14 NOTE — Telephone Encounter (Signed)
Brett [name] from [facility] called and stated there was a miscommunication on their end with the patient and a plan went into effect 08/08/21 that is out of network for [name]. The way she explained it is that it is the same ID number as his original plan just went from a ppo to hmo. In our system it lists as ppo/hmo. As he was unaware of this, he had an awv 11/01/21 and is now being charged out of pocket. Brett [name] stated they are trying to see what options are available until he is able to get a new plan. She stated when she spoke with her team, the only option would be to see if we can apply for a network exception appeal. This would have to be done by his provider. She has already reached out to his Neurosurgeon, Dr.Pool and was denied an exception. She stated if we are unable to do this she can be reached at [phone] ext. [phone]. If we decide to approve this we can call [phone].

## 2022-01-17 NOTE — Telephone Encounter (Signed)
I believe this would mean that we would agree to be in network and we are unable to do that at the office level. This is handled by the health system. Please call Alexis at [phone] ext. [phone] to advise we are also unable to file an appeal.

## 2022-01-27 NOTE — Telephone Encounter (Signed)
Spoke with Ubaldo Glassing made her aware that we were unable to do the program due to it not being in network.

## 2022-04-27 ENCOUNTER — Encounter: Payer: Medicare HMO | Admitting: Family

## 2022-05-16 ENCOUNTER — Telehealth: Payer: Self-pay | Admitting: Family

## 2022-05-16 ENCOUNTER — Telehealth (HOSPITAL_BASED_OUTPATIENT_CLINIC_OR_DEPARTMENT_OTHER): Payer: Self-pay

## 2022-05-16 DIAGNOSIS — Z720 Tobacco use: Secondary | ICD-10-CM

## 2022-05-16 NOTE — Telephone Encounter (Signed)
Please advise pt that I placed an order for his Lung cancer screening CT and he should be contacted about scheduling.

## 2022-05-17 NOTE — Telephone Encounter (Signed)
Called but no answer, lvm for patient to call back about this message

## 2022-05-18 NOTE — Telephone Encounter (Signed)
Patient advised of message and he reports is having to put all appointments on hold until January due to insurance coverage.

## 2022-05-19 ENCOUNTER — Other Ambulatory Visit (HOSPITAL_BASED_OUTPATIENT_CLINIC_OR_DEPARTMENT_OTHER): Payer: Self-pay

## 2022-05-19 MED ORDER — COVID-19 MRNA 2023-2024 VACCINE (COMIRNATY) 0.3 ML INJECTION
INTRAMUSCULAR | 0 refills | Status: DC
  Filled 2022-05-19: qty 0.3, 1d supply, fill #0

## 2022-05-27 ENCOUNTER — Other Ambulatory Visit (HOSPITAL_BASED_OUTPATIENT_CLINIC_OR_DEPARTMENT_OTHER): Payer: Self-pay

## 2022-05-27 MED ORDER — AREXVY 120 MCG/0.5ML IM SUSR
INTRAMUSCULAR | 0 refills | Status: DC
  Filled 2022-05-27: qty 0.5, 1d supply, fill #0

## 2022-06-13 DIAGNOSIS — H524 Presbyopia: Secondary | ICD-10-CM | POA: Diagnosis not present

## 2022-08-09 ENCOUNTER — Encounter: Payer: Self-pay | Admitting: Family

## 2022-08-09 ENCOUNTER — Ambulatory Visit (INDEPENDENT_AMBULATORY_CARE_PROVIDER_SITE_OTHER): Payer: Medicare HMO | Admitting: Family

## 2022-08-09 VITALS — BP 137/69 | HR 84 | Temp 97.8°F | Resp 16 | Ht 75.0 in | Wt 154.0 lb

## 2022-08-09 DIAGNOSIS — Z0000 Encounter for general adult medical examination without abnormal findings: Secondary | ICD-10-CM

## 2022-08-09 DIAGNOSIS — Z125 Encounter for screening for malignant neoplasm of prostate: Secondary | ICD-10-CM | POA: Diagnosis not present

## 2022-08-09 DIAGNOSIS — Z23 Encounter for immunization: Secondary | ICD-10-CM

## 2022-08-09 DIAGNOSIS — L84 Corns and callosities: Secondary | ICD-10-CM

## 2022-08-09 DIAGNOSIS — Z720 Tobacco use: Secondary | ICD-10-CM

## 2022-08-09 DIAGNOSIS — R739 Hyperglycemia, unspecified: Secondary | ICD-10-CM

## 2022-08-09 DIAGNOSIS — J3089 Other allergic rhinitis: Secondary | ICD-10-CM | POA: Diagnosis not present

## 2022-08-09 DIAGNOSIS — H409 Unspecified glaucoma: Secondary | ICD-10-CM

## 2022-08-09 DIAGNOSIS — R634 Abnormal weight loss: Secondary | ICD-10-CM | POA: Diagnosis not present

## 2022-08-09 HISTORY — DX: Corns and callosities: L84

## 2022-08-09 LAB — COMPREHENSIVE METABOLIC PANEL
ALT: 18 U/L (ref 0–53)
AST: 26 U/L (ref 0–37)
Albumin: 4.4 g/dL (ref 3.5–5.2)
Alkaline Phosphatase: 46 U/L (ref 39–117)
BUN: 13 mg/dL (ref 6–23)
CO2: 29 mEq/L (ref 19–32)
Calcium: 9.5 mg/dL (ref 8.4–10.5)
Chloride: 99 mEq/L (ref 96–112)
Creatinine, Ser: 0.77 mg/dL (ref 0.40–1.50)
GFR: 92.24 mL/min (ref >60.00)
Glucose, Bld: 108 mg/dL — ABNORMAL HIGH (ref 70–99)
Potassium: 4.2 mEq/L (ref 3.5–5.1)
Sodium: 137 mEq/L (ref 135–145)
Total Bilirubin: 0.7 mg/dL (ref 0.2–1.2)
Total Protein: 6.9 g/dL (ref 6.0–8.3)

## 2022-08-09 LAB — PSA: PSA: 2.71 ng/mL (ref 0.10–4.00)

## 2022-08-09 LAB — TSH: TSH: 1.71 u[IU]/mL (ref 0.35–5.50)

## 2022-08-09 NOTE — Assessment & Plan Note (Signed)
New. Recommended gentle exfoliation with pumice stone

## 2022-08-09 NOTE — Assessment & Plan Note (Signed)
Uncontrolled. Recommended that he try taking claritin and flonase once daily together and let me know if he does not find this helpful.

## 2022-08-09 NOTE — Progress Notes (Signed)
Subjective:     Patient ID: Brett [name], male    DOB: 1955/11/26, 67 y.o.   MRN: [id number]  No chief complaint on file.   HPI Patient is in today for cpx.   Immunizations: due for PCV 20. Diet: stable diet- diet is OK. Daughter lives with him- she has POTS and Gastroparesis Exercise:  limited by low back pain- no formal exercise recently. Did walk some over the summer.  [facility]: [facility]/2024 Dexa: will call to schedule Vision: up to date (they manage his glaucoma) Dental:  up to date  Reports that he has cut back to 1/2 PPD of cigarettes.  Plans to retry the nicotine patch.  Notes that he had a blister on the ball of his right foot which has healed and now it tender to the touch.  Health Maintenance Due  Topic Date Due   Lung Cancer Screening  05/11/2022    Past Medical History:  Diagnosis Date   Allergy    Arthritis    Blood transfusion without reported diagnosis    Chicken pox    Congenital hip deformity    Glaucoma    Neuromuscular disorder ([place]) 11/2019   sciatica    Past Surgical History:  Procedure Laterality Date   COLONOSCOPY  06/17/2015   [facility]   COLONOSCOPY  07/20/2020   [facility]   HAMMER TOE SURGERY  07/28/2011   Procedure: HAMMER TOE CORRECTION;  Surgeon: Ninetta Lights, MD;  Location: [facility];  Service: Orthopedics;  Laterality: Right;  right 2nd and 4th toes correction hammer toe, capsulotomy metatarsal-phalangeal joints   HERNIA REPAIR     HIP SURGERY  1986 & 2010   rt total hip-8/10-multiple rt hip surgeries- had 4 prior to Ages as a child   lumber fusion  09/2020   POLYPECTOMY     THUMB ARTHROSCOPY  2008   rt    Family History  Problem Relation Age of Onset   Cancer Mother 55       history of colon cancer   Rosacea Mother    Colon cancer Mother 14   Colon polyps Mother    Rosacea Father    Lung disease Father        ?pulmonary fibrosis   Alcohol abuse Brother    Depression Brother     Colon cancer Maternal Grandmother    Endocrine tumor Daughter        pituitary tumor, POTTS   Thyroid disease Son        ?hyperthyroid   Cancer Cousin        colon   Colon cancer Cousin    Esophageal cancer Neg Hx    Rectal cancer Neg Hx    Stomach cancer Neg Hx     Social History   Socioeconomic History   Marital status: Widowed    Spouse name: Not on file   Number of children: Not on file   Years of education: Not on file   Highest education level: Not on file  Occupational History   Not on file  Tobacco Use   Smoking status: Every Day    Packs/day: 0.50    Years: 46.00    Total pack years: 23.00    Types: Cigarettes    Start date: 27   Smokeless tobacco: Never   Tobacco comments:    0.5 ppd  Vaping Use   Vaping Use: Never used  Substance and Sexual Activity   Alcohol use: Yes  Alcohol/week: 14.0 standard drinks of alcohol    Types: 14 Cans of beer per week   Drug use: No   Sexual activity: Not on file  Other Topics Concern   Not on file  Social History Narrative   Quality control Tech- gauges/callibrations.  (Retired)   Some colleg/tech school   wife passed   3 grown children (oldest daughter is living with them) youngest daughter lives in [place].  Curator at [employer].  Son lives near [place]- framing/art.   Social Determinants of Health   Financial Resource Strain: Low Risk  (11/01/2021)   Overall Financial Resource Strain (CARDIA)    Difficulty of Paying Living Expenses: Not hard at all  Food Insecurity: No Food Insecurity (11/01/2021)   Hunger Vital Sign    Worried About Running Out of Food in the Last Year: Never true    Ran Out of Food in the Last Year: Never true  Transportation Needs: No Transportation Needs (11/01/2021)   PRAPARE - Hydrologist (Medical): No    Lack of Transportation (Non-Medical): No  Physical Activity: Inactive (11/01/2021)   Exercise Vital Sign    Days of Exercise per Week: 0 days     Minutes of Exercise per Session: 0 min  Stress: No Stress Concern Present (11/01/2021)   [place]    Feeling of Stress : Not at all  Social Connections: Moderately Isolated (11/01/2021)   Social Connection and Isolation Panel [NHANES]    Frequency of Communication with Friends and Family: More than three times a week    Frequency of Social Gatherings with Friends and Family: Once a week    Attends Religious Services: More than 4 times per year    Active Member of [employer] or Organizations: No    Attends Archivist Meetings: Never    Marital Status: Widowed  Intimate Partner Violence: Not At Risk (11/01/2021)   Humiliation, Afraid, Rape, and Kick questionnaire    Fear of Current or Ex-Partner: No    Emotionally Abused: No    Physically Abused: No    Sexually Abused: No    Outpatient Medications Prior to Visit  Medication Sig Dispense Refill   acetaminophen (TYLENOL) 325 MG tablet Take 650 mg by mouth every 6 (six) hours as needed.     Ascorbic Acid (VITAMIN C) 1000 MG tablet Take 1,000 mg by mouth daily.     b complex vitamins capsule Take 1 capsule by mouth daily.     ibuprofen (ADVIL) 200 MG tablet Take 200 mg by mouth every 6 (six) hours as needed.     Multiple Vitamin (MULTIVITAMIN) tablet Take 1 tablet by mouth daily.     Omega-3 Fatty Acids (FISH OIL PO) Take 1 capsule by mouth daily.     sildenafil (REVATIO) 20 MG tablet Take 1-2 tablets by mouth 30 minutes prior to sexual activity. 30 tablet 1   TRAVATAN Z 0.004 % SOLN ophthalmic solution Place 1 drop into both eyes daily.  1   COVID-19 mRNA bivalent vaccine, Pfizer, injection Inject into the muscle. (Patient not taking: Reported on 11/01/2021) 0.3 mL 0   COVID-19 mRNA vaccine 2023-2024 (COMIRNATY) SUSP injection Inject into the muscle. 0.3 mL 0   nicotine (NICODERM CQ) 21 mg/24hr patch Place 1 patch (21 mg total) onto the skin daily. 42 patch 0   RSV  vaccine recomb adjuvanted (AREXVY) 120 MCG/0.5ML injection Inject into the muscle. 0.5 mL 0  No facility-administered medications prior to visit.    No Known Allergies  Review of Systems  Constitutional:  Positive for weight loss (mild).       Wt Readings from Last 3 Encounters: 08/09/22 : 154 lb (69.9 kg) 11/01/21 : 162 lb (73.5 kg) 04/26/21 : 158 lb (71.7 kg)    HENT:  Positive for congestion (chronic post nasal drip, limited improvement with claritin or flonase- did not try together).   Eyes:  Negative for blurred vision.  Respiratory:  Negative for shortness of breath.   Cardiovascular:  Negative for leg swelling.  Gastrointestinal:  Negative for blood in stool, constipation and diarrhea.  Genitourinary:  Negative for dysuria and frequency.  Musculoskeletal:  Positive for back pain (chronic).  Skin:  Negative for rash.  Neurological:  Negative for headaches.  Psychiatric/Behavioral:         Denies depression/anxiety       Objective:    Physical Exam  BP 137/69 (BP Location: Left Arm, Patient Position: Sitting, Cuff Size: Small)   Pulse 84   Temp 97.8 F (36.6 C) (Oral)   Resp 16   Ht _0  (1.905 m)   Wt 154 lb (69.9 kg)   SpO2 98%   BMI 19.25 kg/m  Wt Readings from Last 3 Encounters:  08/09/22 154 lb (69.9 kg)  11/01/21 162 lb (73.5 kg)  04/26/21 158 lb (71.7 kg)   Physical Exam  Constitutional: He is oriented to person, place, and time. He appears well-developed and well-nourished. No distress.  HENT:  Head: Normocephalic and atraumatic.  Right Ear: Tympanic membrane and ear canal normal.  Left Ear: Tympanic membrane and ear canal normal.  Mouth/Throat: Oropharynx is clear and moist.  Eyes: Pupils are equal, round, and reactive to light. No scleral icterus.  Neck: Normal range of motion. No thyromegaly present.  Cardiovascular: Normal rate and regular rhythm.   No murmur heard. Pulmonary/Chest: Effort normal and breath sounds normal. No respiratory  distress. He has no wheezes. He has no rales. He exhibits no tenderness.  Abdominal: Soft. Bowel sounds are normal. He exhibits no distension and no mass. There is no tenderness. There is no rebound and no guarding.  Musculoskeletal: He exhibits no edema.  Lymphadenopathy:    He has no cervical adenopathy.  Neurological: He is alert and oriented to person, place, and time. He has normal patellar reflexes. He exhibits normal muscle tone. Coordination normal.  Skin: Skin is warm and dry. (Corn noted right foot plantar surface) Psychiatric: He has a normal mood and affect. His behavior is normal. Judgment and thought content normal.           Assessment & Plan:       Assessment & Plan:   Problem List Items Addressed This Visit       Unprioritized   Tobacco abuse    Encouraged complete cessation- pt to schedule lung cancer screening CT.       Rhinitis, allergic    Uncontrolled. Recommended that he try taking claritin and flonase once daily together and let me know if he does not find this helpful.       Preventative health care    Prevnar 20 today.  [facility], pool or [facility]. [place] up to date.       Glaucoma    Managed by optometry.  Clinically stable.       Corn of foot    New. Recommended gentle exfoliation with pumice stone  Other Visit Diagnoses     Hyperglycemia    -  Primary   Relevant Orders   Comp Met (CMET)   Screening for prostate cancer       Relevant Orders   PSA   Weight loss       Relevant Orders   TSH   Need for pneumococcal 20-valent conjugate vaccination       Relevant Orders   Pneumococcal conjugate vaccine 20-valent (Prevnar 20) (Completed)       I have discontinued Rose Fillers. Nila's nicotine, COVID-19 mRNA bivalent vaccine [employer]), COVID-19 mRNA vaccine 2023-2024, and Arexvy. I am also having him maintain his Travatan Z, multivitamin, b complex vitamins, vitamin C, Omega-3 Fatty Acids (FISH OIL PO),  sildenafil, acetaminophen, and ibuprofen.  No orders of the defined types were placed in this encounter.

## 2022-08-09 NOTE — Assessment & Plan Note (Signed)
Managed by optometry.  Clinically stable.

## 2022-08-09 NOTE — Assessment & Plan Note (Addendum)
Prevnar 20 today.  [facility], [facility] or [facility] yoga. Colo up to date.

## 2022-08-09 NOTE — Assessment & Plan Note (Signed)
Encouraged complete cessation- pt to schedule lung cancer screening CT.

## 2022-08-10 ENCOUNTER — Ambulatory Visit (HOSPITAL_BASED_OUTPATIENT_CLINIC_OR_DEPARTMENT_OTHER): Payer: Medicare HMO

## 2022-08-11 ENCOUNTER — Ambulatory Visit (HOSPITAL_BASED_OUTPATIENT_CLINIC_OR_DEPARTMENT_OTHER): Payer: Medicare HMO

## 2022-08-12 ENCOUNTER — Ambulatory Visit (HOSPITAL_BASED_OUTPATIENT_CLINIC_OR_DEPARTMENT_OTHER): Payer: Medicare HMO

## 2022-08-17 ENCOUNTER — Ambulatory Visit (HOSPITAL_BASED_OUTPATIENT_CLINIC_OR_DEPARTMENT_OTHER): Payer: Medicare HMO

## 2022-08-22 ENCOUNTER — Telehealth: Payer: Self-pay | Admitting: Family

## 2022-08-22 NOTE — Telephone Encounter (Signed)
Dawn from [facility] was calling to let us know the PA for the CT scan has been denied. She would just like ot know if pcp is going to appeal this or what they would like to do. Her number is [phone] ext. [phone].

## 2022-08-24 ENCOUNTER — Ambulatory Visit (HOSPITAL_BASED_OUTPATIENT_CLINIC_OR_DEPARTMENT_OTHER): Payer: Medicare HMO

## 2022-09-06 ENCOUNTER — Ambulatory Visit (HOSPITAL_BASED_OUTPATIENT_CLINIC_OR_DEPARTMENT_OTHER): Payer: Medicare HMO

## 2022-09-06 ENCOUNTER — Encounter (HOSPITAL_BASED_OUTPATIENT_CLINIC_OR_DEPARTMENT_OTHER): Payer: Self-pay

## 2022-09-19 ENCOUNTER — Encounter: Payer: Self-pay | Admitting: Family Medicine

## 2022-09-19 ENCOUNTER — Telehealth: Payer: Self-pay

## 2022-09-19 ENCOUNTER — Telehealth (INDEPENDENT_AMBULATORY_CARE_PROVIDER_SITE_OTHER): Payer: Medicare HMO | Admitting: Family Medicine

## 2022-09-19 DIAGNOSIS — U071 COVID-19: Secondary | ICD-10-CM

## 2022-09-19 MED ORDER — BENZONATATE 200 MG PO CAPS: 200.0000 mg | ORAL_CAPSULE | Freq: Two times a day (BID) | ORAL | 0 refills | Status: DC | PRN

## 2022-09-19 MED ORDER — NIRMATRELVIR/RITONAVIR (PAXLOVID)TABLET: 3.0000 | ORAL_TABLET | Freq: Two times a day (BID) | ORAL | 0 refills | Status: AC

## 2022-09-19 NOTE — Progress Notes (Signed)
Chief Complaint  Patient presents with   Covid Positive    Tested positive Sunday 09/18/22      Elwanda [place] here for URI complaints. Due to COVID-19 pandemic, we are interacting via web portal for an electronic face-to-face visit. I verified patient's ID using 2 identifiers. Patient agreed to proceed with visit via this method. Patient is at home, I am at office. Patient and I are present for visit.   Duration: 4 days  Associated symptoms: Fever (101.7 F), sinus congestion, rhinorrhea, ear fullness, sore throat, myalgia, and coughing Denies: sinus pain, itchy watery eyes, ear pain, ear drainage, wheezing, shortness of breath, loss of taste/smell, and N/V/D Treatment to date: ibuprofen Sick contacts: Yes; daughter Tested + for covid yesterday.   Past Medical History:  Diagnosis Date   Allergy    Arthritis    Blood transfusion without reported diagnosis    Chicken pox    Congenital hip deformity    Glaucoma    Neuromuscular disorder ([place]) 11/2019   sciatica    Objective No conversational dyspnea Age appropriate judgment and insight Nml affect and mood  COVID-19 - Plan: nirmatrelvir/ritonavir (PAXLOVID) 20 x 150 MG & 10 x 100MG TABS, benzonatate (TESSALON) 200 MG capsule  Given age, will send in [place] for 5 d. Tessalon Perles prn. Continue to push fluids, practice good hand hygiene, cover mouth when coughing. CDC quarantining guidelines discussed. F/u prn. If starting to experience irreplaceable fluid loss, shaking, or shortness of breath, seek immediate care. Pt voiced understanding and agreement to the plan.  [place], DO 09/19/22 2:23 PM

## 2022-09-19 NOTE — Telephone Encounter (Signed)
Initial Comment caller state her dad tested positive for covid. pt has a cough and congestion and shortness of breath Translation No Nurse Assessment Nurse: Dellie Catholic, RN, Cindy Date/Time (Eastern Time): 09/18/2022 4:50:04 PM Confirm and document reason for call. If symptomatic, describe symptoms. ---Caller states patient tested positive for Covid today at home. States he has a new cough and congested. Does the patient have any new or worsening symptoms? ---Yes Will a triage be completed? ---Yes Related visit to physician within the last 2 weeks? ---No Does the PT have any chronic conditions? (i.e. diabetes, asthma, this includes High risk factors for pregnancy, etc.) ---Yes List chronic conditions. ---Glaucoma , Smoker , Is this a behavioral health or substance abuse call? ---No Guidelines Guideline Title Affirmed Question Affirmed Notes Nurse Date/Time ([place] Time) COVID-19 - Diagnosed or Suspected [1] HIGH RISK patient (e.g., weak immune system, age > 79 years, obesity with BMI 30 or higher, pregnant, chronic lung disease or other chronic medical condition) AND [2] COVID [place], RN, Cindy 09/18/2022 4:52:04 PM PLEASE NOTE: All timestamps contained within this report are represented as [place] Standard Time. CONFIDENTIALTY NOTICE: This fax transmission is intended only for the addressee. It contains information that is legally privileged, confidential or otherwise protected from use or disclosure. If you are not the intended recipient, you are strictly prohibited from reviewing, disclosing, copying using or disseminating any of this information or taking any action in reliance on or regarding this information. If you have received this fax in error, please notify us immediately by telephone so that we can arrange for its return to [name]. Phone: [phone], Toll-Free: [phone], Fax: [phone] Page: 2 of 2 Call Id: [id number] Guidelines Guideline Title Affirmed Question  Affirmed Notes Nurse Date/Time Eilene Ghazi Time) symptoms (e.g., cough, fever) (Exceptions: Already seen by PCP and no new or worsening symptoms.) Disp. Time Eilene Ghazi Time) Disposition Final User 09/18/2022 4:48:48 PM Send to Urgent Donnamarie Rossetti 09/18/2022 4:57:03 PM Call PCP within 24 Hours Yes Dellie Catholic, RN, Jenny Reichmann Final Disposition 09/18/2022 4:57:03 PM Call PCP within 24 Hours Yes Dellie Catholic, RN, Jenny Reichmann Caller Disagree/Comply Comply Caller Understands Yes PreDisposition Call Doctor Care Advice Given Per Guideline CALL PCP WITHIN 24 HOURS: * IF OFFICE WILL BE OPEN: Call the office when it opens tomorrow morning. CALL BACK IF: * You become worse CARE ADVICE given per COVID-19 - DIAGNOSED OR SUSPECTED (Adult) guideline. * WEAR A MASK FOR 10 DAYS: Wear a well-fitted mask for 10 full days any time you are around others inside your home or in public. Do not go to places where you are unable to wear a mask. Referrals REFERRED TO PCP OFFICE

## 2022-09-19 NOTE — Telephone Encounter (Signed)
Appt scheduled today °

## 2022-09-22 ENCOUNTER — Emergency Department (HOSPITAL_COMMUNITY)
Admission: EM | Admit: 2022-09-22 | Discharge: 2022-09-22 | Disposition: A | Discharge status: Home / Self Care | Payer: Medicare HMO | Attending: Emergency Medicine | Admitting: Emergency Medicine

## 2022-09-22 ENCOUNTER — Other Ambulatory Visit (HOSPITAL_BASED_OUTPATIENT_CLINIC_OR_DEPARTMENT_OTHER): Payer: Self-pay

## 2022-09-22 ENCOUNTER — Emergency Department (HOSPITAL_COMMUNITY): Payer: Medicare HMO

## 2022-09-22 ENCOUNTER — Other Ambulatory Visit: Payer: Self-pay

## 2022-09-22 DIAGNOSIS — Z743 Need for continuous supervision: Secondary | ICD-10-CM | POA: Diagnosis not present

## 2022-09-22 DIAGNOSIS — Z79899 Other long term (current) drug therapy: Secondary | ICD-10-CM | POA: Insufficient documentation

## 2022-09-22 DIAGNOSIS — Y9301 Activity, walking, marching and hiking: Secondary | ICD-10-CM | POA: Diagnosis not present

## 2022-09-22 DIAGNOSIS — W01198A Fall on same level from slipping, tripping and stumbling with subsequent striking against other object, initial encounter: Secondary | ICD-10-CM | POA: Insufficient documentation

## 2022-09-22 DIAGNOSIS — R69 Illness, unspecified: Secondary | ICD-10-CM | POA: Diagnosis not present

## 2022-09-22 DIAGNOSIS — S0990XA Unspecified injury of head, initial encounter: Secondary | ICD-10-CM | POA: Diagnosis not present

## 2022-09-22 DIAGNOSIS — S2242XA Multiple fractures of ribs, left side, initial encounter for closed fracture: Secondary | ICD-10-CM | POA: Diagnosis not present

## 2022-09-22 DIAGNOSIS — S01112A Laceration without foreign body of left eyelid and periocular area, initial encounter: Secondary | ICD-10-CM | POA: Insufficient documentation

## 2022-09-22 DIAGNOSIS — R531 Weakness: Secondary | ICD-10-CM | POA: Diagnosis not present

## 2022-09-22 DIAGNOSIS — I6782 Cerebral ischemia: Secondary | ICD-10-CM | POA: Diagnosis not present

## 2022-09-22 DIAGNOSIS — M549 Dorsalgia, unspecified: Secondary | ICD-10-CM | POA: Diagnosis not present

## 2022-09-22 DIAGNOSIS — Y92009 Unspecified place in unspecified non-institutional (private) residence as the place of occurrence of the external cause: Secondary | ICD-10-CM | POA: Diagnosis not present

## 2022-09-22 DIAGNOSIS — F1010 Alcohol abuse, uncomplicated: Secondary | ICD-10-CM | POA: Insufficient documentation

## 2022-09-22 DIAGNOSIS — R9431 Abnormal electrocardiogram [ECG] [EKG]: Secondary | ICD-10-CM | POA: Diagnosis not present

## 2022-09-22 DIAGNOSIS — W19XXXA Unspecified fall, initial encounter: Secondary | ICD-10-CM | POA: Diagnosis not present

## 2022-09-22 DIAGNOSIS — Y907 Blood alcohol level of 200-239 mg/100 ml: Secondary | ICD-10-CM | POA: Insufficient documentation

## 2022-09-22 DIAGNOSIS — R0789 Other chest pain: Secondary | ICD-10-CM | POA: Diagnosis not present

## 2022-09-22 DIAGNOSIS — Z043 Encounter for examination and observation following other accident: Secondary | ICD-10-CM | POA: Diagnosis not present

## 2022-09-22 LAB — CBC
HCT: 44.4 % (ref 39.0–52.0)
Hemoglobin: 15 g/dL (ref 13.0–17.0)
MCH: 34.7 pg — ABNORMAL HIGH (ref 26.0–34.0)
MCHC: 33.8 g/dL (ref 30.0–36.0)
MCV: 102.8 fL — ABNORMAL HIGH (ref 80.0–100.0)
Platelets: 161 10*3/uL (ref 150–400)
RBC: 4.32 MIL/uL (ref 4.22–5.81)
RDW: 12.1 % (ref 11.5–15.5)
WBC: 6.2 10*3/uL (ref 4.0–10.5)
nRBC: 0 % (ref 0.0–0.2)

## 2022-09-22 LAB — URINALYSIS, ROUTINE W REFLEX MICROSCOPIC
Bilirubin Urine: NEGATIVE
Glucose, UA: NEGATIVE mg/dL
Hgb urine dipstick: NEGATIVE
Ketones, ur: NEGATIVE mg/dL
Leukocytes,Ua: NEGATIVE
Nitrite: NEGATIVE
Protein, ur: NEGATIVE mg/dL
Specific Gravity, Urine: 1.002 — ABNORMAL LOW (ref 1.005–1.030)
pH: 6 (ref 5.0–8.0)

## 2022-09-22 LAB — BASIC METABOLIC PANEL
Anion gap: 12 (ref 5–15)
BUN: 10 mg/dL (ref 8–23)
CO2: 24 mmol/L (ref 22–32)
Calcium: 8.8 mg/dL — ABNORMAL LOW (ref 8.9–10.3)
Chloride: 102 mmol/L (ref 98–111)
Creatinine, Ser: 0.87 mg/dL (ref 0.61–1.24)
GFR, Estimated: >60 mL/min (ref >60)
Glucose, Bld: 91 mg/dL (ref 70–99)
Potassium: 3.3 mmol/L — ABNORMAL LOW (ref 3.5–5.1)
Sodium: 138 mmol/L (ref 135–145)

## 2022-09-22 LAB — CBG MONITORING, ED: Glucose-Capillary: 93 mg/dL (ref 70–99)

## 2022-09-22 LAB — ETHANOL: Alcohol, Ethyl (B): 231 mg/dL — ABNORMAL HIGH (ref <10)

## 2022-09-22 MED ORDER — ACETAMINOPHEN 500 MG PO TABS
500.0000 mg | ORAL_TABLET | Freq: Four times a day (QID) | ORAL | 0 refills | Status: DC | PRN
  Filled 2022-09-22: qty 100, 25d supply, fill #0

## 2022-09-22 MED ORDER — ACETAMINOPHEN 500 MG PO TABS
1000.0000 mg | ORAL_TABLET | Freq: Once | ORAL | Status: AC
  Administered 2022-09-22: 1000 mg via ORAL
  Filled 2022-09-22: qty 2

## 2022-09-22 NOTE — Discharge Instructions (Signed)
You have been evaluated for your fall.  Your exam is remarkable for evidence of several broken ribs on the left side of your chest.  Please use incentive spirometer several times hourly for the next several days to decrease risk of developing pneumonia.  Take Tylenol as needed for aches and pain.  Continue to finish the rest of your Paxlovid.  Please avoid alcohol use as it is negatively impacting your health.  Use resource below to seek help.  Return to the ER if you have any concern.

## 2022-09-22 NOTE — ED Provider Notes (Signed)
[facility] Provider Note   CSN: [id number] Arrival date & time: 09/22/22  1233     History  Chief Complaint  Patient presents with   Back Pain   Fall    Brett [name] is a 67 y.o. male.  The history is provided by the patient and medical records. No language interpreter was used.  Back Pain Fall     67 year old male with significant history of neuromuscular disorder, congenital hip deformity, regular alcohol use, presenting for evaluation of a fall.  Patient report he was walking outside to empty his trash can when his leg gave out causing him to fall forward and striking his head against the ground.  No loss of consciousness.  He reported he was having difficulty standing up due to experiencing global weakness throughout his body.  He has to try to pull himself up with a light pole on the side of the road and later on bystander was able to come by and aid him.  Patient endorsed some pain to the left side of his chest from the impact as well as mild headache.  He mention his numbness and weakness sensation has since resolved.  Patient states he has had multiple falls similar to this in the past and he attributed to his congenital hip dysplasia causing gait abnormalities.  He denies any precipitating symptoms prior to the fall.  He is not on any blood thinner medication.  Report pain is minimal at this time.  He does admits to alcohol use and states he has 2 shots of liquor earlier today which is not unusual for him.  He denies any recent medication changes no dysuria no history of prior stroke.  Home Medications Prior to Admission medications   Medication Sig Start Date End Date Taking? Authorizing Provider  acetaminophen (TYLENOL) 325 MG tablet Take 650 mg by mouth every 6 (six) hours as needed.    [provider]  Ascorbic Acid (VITAMIN C) 1000 MG tablet Take 1,000 mg by mouth daily.    [provider]  b complex  vitamins capsule Take 1 capsule by mouth daily. 03/24/17   Debbrah Alar, NP  benzonatate (TESSALON) 200 MG capsule Take 1 capsule (200 mg total) by mouth 2 (two) times daily as needed for cough. 09/19/22   Shelda Pal, DO  ibuprofen (ADVIL) 200 MG tablet Take 200 mg by mouth every 6 (six) hours as needed.    [provider]  Multiple Vitamin (MULTIVITAMIN) tablet Take 1 tablet by mouth daily.    [provider]  nirmatrelvir/ritonavir (PAXLOVID) 20 x 150 MG & 10 x 100MG TABS Take 3 tablets by mouth 2 (two) times daily for 5 days. (Take nirmatrelvir 150 mg two tablets twice daily for 5 days and ritonavir 100 mg one tablet twice daily for 5 days) Patient GFR is 92 09/19/22 09/24/22  Wendling, Crosby Oyster, DO  Omega-3 Fatty Acids (FISH OIL PO) Take 1 capsule by mouth daily.    [provider]  sildenafil (REVATIO) 20 MG tablet Take 1-2 tablets by mouth 30 minutes prior to sexual activity. 04/26/21   Debbrah Alar, NP  TRAVATAN Z 0.004 % SOLN ophthalmic solution Place 1 drop into both eyes daily. 01/20/15   [provider]      Allergies    Patient has no known allergies.    Review of Systems   Review of Systems  Musculoskeletal:  Positive for back pain.  All other  systems reviewed and are negative.   Physical Exam Updated Vital Signs BP 119/73 (BP Location: Left Arm)   Pulse 87   Temp 97.7 F (36.5 C) (Oral)   Resp 16   Wt 69 kg   SpO2 96%   BMI 19.01 kg/m  Physical Exam Vitals and nursing note reviewed.  Constitutional:      General: He is not in acute distress.    Appearance: He is well-developed.     Comments: Elderly male laying in bed appears to be in no acute discomfort.  HENT:     Head: Normocephalic.     Comments: Small skin tear noted to left lateral region with minimal tenderness to palpation but no crepitus and no active bleeding. Eyes:     Extraocular Movements: Extraocular movements intact.      Conjunctiva/sclera: Conjunctivae normal.     Pupils: Pupils are equal, round, and reactive to light.  Neck:     Comments: No midline spine tenderness Cardiovascular:     Rate and Rhythm: Normal rate and regular rhythm.     Pulses: Normal pulses.     Heart sounds: Normal heart sounds.  Pulmonary:     Effort: Pulmonary effort is normal.     Breath sounds: Normal breath sounds.  Chest:     Chest wall: Tenderness (Mild tenderness to left anterior chest wall without any crepitus emphysema or overlying skin changes.) present.  Abdominal:     Palpations: Abdomen is soft.  Musculoskeletal:        General: No deformity.     Cervical back: Normal range of motion and neck supple.     Comments: Able to move all 4 extremities with equal effort.  Sensation is intact throughout.  No tenderness to lumbar spine or bilateral knees or ankle.  Skin:    Findings: No rash.  Neurological:     Mental Status: He is alert. Mental status is at baseline.  Psychiatric:        Mood and Affect: Mood normal.     ED Results / Procedures / Treatments   Labs (all labs ordered are listed, but only abnormal results are displayed) Labs Reviewed  BASIC METABOLIC PANEL - Abnormal; Notable for the following components:      Result Value   Potassium 3.3 (*)    Calcium 8.8 (*)    All other components within normal limits  CBC - Abnormal; Notable for the following components:   MCV 102.8 (*)    MCH 34.7 (*)    All other components within normal limits  URINALYSIS, ROUTINE W REFLEX MICROSCOPIC - Abnormal; Notable for the following components:   Color, Urine COLORLESS (*)    Specific Gravity, Urine 1.002 (*)    All other components within normal limits  ETHANOL - Abnormal; Notable for the following components:   Alcohol, Ethyl (B) 231 (*)    All other components within normal limits  CBG MONITORING, ED    EKG None  Date: 09/22/2022  Rate: 67  Rhythm: normal sinus rhythm  QRS Axis: normal  Intervals:  normal  ST/T Wave abnormalities: normal  Conduction Disutrbances: none  Narrative Interpretation:   Old EKG Reviewed: No significant changes noted    Radiology CT HEAD WO CONTRAST  Result Date: 09/22/2022 CLINICAL DATA:  Trauma/fall, ETOH EXAM: CT HEAD WITHOUT CONTRAST TECHNIQUE: Contiguous axial images were obtained from the base of the skull through the vertex without intravenous contrast. RADIATION DOSE REDUCTION: This exam was performed according to the departmental  dose-optimization program which includes automated exposure control, adjustment of the mA and/or kV according to patient size and/or use of iterative reconstruction technique. COMPARISON:  03/10/2020 FINDINGS: Brain: No evidence of acute infarction, hemorrhage, hydrocephalus, extra-axial collection or mass lesion/mass effect. Mild subcortical white matter and periventricular small vessel ischemic changes. Vascular: Mild intracranial atherosclerosis. Skull: Normal. Negative for fracture or focal lesion. Sinuses/Orbits: The visualized paranasal sinuses are essentially clear. The mastoid air cells are unopacified. Other: None. IMPRESSION: No evidence of acute intracranial abnormality. Mild small vessel ischemic changes. Electronically Signed   By: Julian Hy M.D.   On: 09/22/2022 13:51   DG Ribs Unilateral W/Chest Left  Result Date: 09/22/2022 CLINICAL DATA:  Fall EXAM: LEFT RIBS AND CHEST - 3 VIEW COMPARISON:  None Available. FINDINGS: Mild cortical buckling of the anterolateral 6th-8th left ribs. No evidence of pneumothorax or pleural effusion. Biapical pleural-parenchymal scarring. Both lungs are clear. Heart size and mediastinal contours are within normal limits. Dextrocurvature of the lumbar spine. IMPRESSION: Mild cortical buckling of the anterolateral 6th-8th left ribs, which may be due to nondisplaced fractures. No evidence of pneumothorax. Electronically Signed   By: Yetta Glassman M.D.   On: 09/22/2022 13:35     Procedures Procedures    Medications Ordered in ED Medications  acetaminophen (TYLENOL) tablet 1,000 mg (has no administration in time range)    ED Course/ Medical Decision Making/ A&P                             Medical Decision Making Amount and/or Complexity of Data Reviewed Labs: ordered. Radiology: ordered.   BP 119/73 (BP Location: Left Arm)   Pulse 87   Temp 97.7 F (36.5 C) (Oral)   Resp 16   Wt 69 kg   SpO2 96%   BMI 19.01 kg/m   31:40 PM 67 year old male with significant history of neuromuscular disorder, congenital hip deformity, regular alcohol use, presenting for evaluation of a fall.  Patient report he was walking outside to empty his trash can when his leg gave out causing him to fall forward and striking his head against the ground.  No loss of consciousness.  He reported he was having difficulty standing up due to experiencing global weakness throughout his body.  He has to try to pull himself up with a light pole on the side of the road and later on bystander was able to come by and aid him.  Patient endorsed some pain to the left side of his chest from the impact as well as mild headache.  He mention his numbness and weakness sensation has since resolved.  Patient states he has had multiple falls similar to this in the past and he attributed to his congenital hip dysplasia causing gait abnormalities.  He denies any precipitating symptoms prior to the fall.  He is not on any blood thinner medication.  Report pain is minimal at this time.  He does admits to alcohol use and states he has 2 shots of liquor earlier today which is not unusual for him.  He denies any recent medication changes no dysuria no history of prior stroke.  On exam this is an elderly male sitting in the chair appears to be in no acute discomfort.  Does have some small skin tear noted to the left orbital region without any crepitus.  No tenderness along the cervical spine along his midline spine.   No signs of injury across his chest  abdomen or his extremities.  He is able to move all 4 extremities with equal effort and sensation is intact throughout.  He is mentating at baseline.  Vital signs reviewed and overall reassuring no hypoxia no fever and no hypotension.  Patient mention he has had similar falls like this in the past and attributed to his hip dysplasia.  Given his age, will obtain basic labs, EKG, UA, CBG and will obtain head CT scan for further assessment.  He is up-to-date with tetanus.  2:27 PM -Labs ordered, independently viewed and interpreted by me.  Labs remarkable for ETOH 231.  Electrolytes are reassuring, ua without infection -The patient was maintained on a cardiac monitor.  I personally viewed and interpreted the cardiac monitored which showed an underlying rhythm of: NSR -Imaging independently viewed and interpreted by me and I agree with radiologist's interpretation.  Result remarkable for head CT and ribs xray shows possible nondisplaced fractures of left 6-8 ribs. This is the site of his pain.  Will provide incentive spirometer -This patient presents to the ED for concern of fall, this involves an extensive number of treatment options, and is a complaint that carries with it a high risk of complications and morbidity.  The differential diagnosis includes mechanical fall, orthostatic hypotension, stroke, caudal equina, cardiac arrhythmia -Co morbidities that complicate the patient evaluation includes hip dysplasia, alcohol use -Treatment includes incentive spirometer -Reevaluation of the patient after these medicines showed that the patient improved -PCP office notes or outside notes reviewed -Discussion with attending Dr. Jeanell Sparrow -Escalation to admission/observation considered: patients feels much better, is comfortable with discharge, and will follow up with PCP -Prescription medication considered, patient comfortable with tylenol -Social Determinant of Health considered  which includes physical inactivity  2:30 PM Workup today is remarkable for possible left 6-8 ribs non displaced fractures.  No other source of injury noted.  Patient able to ambulate on his own.  He has a mildly antalgic gait but this is secondary to his chronic hip dysplasia and this is not new.  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  He has a small skin tear on his left lower region which does not require repair.  Patient provided with incentive spirometer and supportive care but otherwise he is stable to be discharged home.  I suspect patient's fall is likely due to his alcohol intoxication as his alcohol level is elevated today.  His daughter is at bedside and at this time I felt patient is stable to be discharged home.  Give resources for outpatient detox treatment.  Incentive spirometer for rib fracture.  Return precaution given.  Tylenol given for pain with improvement of symptoms.        Final Clinical Impression(s) / ED Diagnoses Final diagnoses:  Fall at home, initial encounter  Alcohol abuse  Minor head injury, initial encounter  Closed fracture of multiple ribs of left side, initial encounter    Rx / DC Orders ED Discharge Orders          Ordered    acetaminophen (TYLENOL) 500 MG tablet  Every 6 hours PRN        09/22/22 1505              Domenic Moras, PA-C 09/22/22 1507    Pattricia Boss, MD 09/24/22 1534

## 2022-09-22 NOTE — ED Triage Notes (Signed)
BIBA for witnessed mechanical fall when taking trash out.  Pt reports hitting head Denies blood thinner usage and LOC.  Pt reports ETOH usage this am.  Pain from lower back to mid back  Pt reports chronic numbness/weakness to bilateral legs

## 2022-09-29 ENCOUNTER — Other Ambulatory Visit (HOSPITAL_BASED_OUTPATIENT_CLINIC_OR_DEPARTMENT_OTHER): Payer: Self-pay

## 2022-10-10 DIAGNOSIS — M25551 Pain in right hip: Secondary | ICD-10-CM | POA: Diagnosis not present

## 2022-10-10 DIAGNOSIS — Z96641 Presence of right artificial hip joint: Secondary | ICD-10-CM | POA: Diagnosis not present

## 2022-10-10 DIAGNOSIS — T8484XA Pain due to internal orthopedic prosthetic devices, implants and grafts, initial encounter: Secondary | ICD-10-CM | POA: Diagnosis not present

## 2022-10-11 ENCOUNTER — Other Ambulatory Visit (HOSPITAL_COMMUNITY): Payer: Self-pay | Admitting: Student

## 2022-10-11 DIAGNOSIS — R269 Unspecified abnormalities of gait and mobility: Secondary | ICD-10-CM | POA: Diagnosis not present

## 2022-10-11 DIAGNOSIS — M2041 Other hammer toe(s) (acquired), right foot: Secondary | ICD-10-CM | POA: Diagnosis not present

## 2022-10-11 DIAGNOSIS — M2042 Other hammer toe(s) (acquired), left foot: Secondary | ICD-10-CM | POA: Diagnosis not present

## 2022-10-11 DIAGNOSIS — L565 Disseminated superficial actinic porokeratosis (DSAP): Secondary | ICD-10-CM | POA: Diagnosis not present

## 2022-10-11 DIAGNOSIS — Z96641 Presence of right artificial hip joint: Secondary | ICD-10-CM

## 2022-10-11 DIAGNOSIS — M216X2 Other acquired deformities of left foot: Secondary | ICD-10-CM | POA: Diagnosis not present

## 2022-10-11 DIAGNOSIS — M2170 Unequal limb length (acquired), unspecified site: Secondary | ICD-10-CM | POA: Diagnosis not present

## 2022-10-11 DIAGNOSIS — M216X1 Other acquired deformities of right foot: Secondary | ICD-10-CM | POA: Diagnosis not present

## 2022-10-21 ENCOUNTER — Encounter (HOSPITAL_COMMUNITY)
Admission: RE | Admit: 2022-10-21 | Discharge: 2022-10-21 | Disposition: A | Discharge status: Home / Self Care | Payer: Medicare HMO | Source: Ambulatory Visit | Attending: Student | Admitting: Student

## 2022-10-21 DIAGNOSIS — Z96641 Presence of right artificial hip joint: Secondary | ICD-10-CM

## 2022-10-21 DIAGNOSIS — M1612 Unilateral primary osteoarthritis, left hip: Secondary | ICD-10-CM | POA: Diagnosis not present

## 2022-10-21 MED ORDER — TECHNETIUM TC 99M MEDRONATE IV KIT
19.9000 | PACK | Freq: Once | INTRAVENOUS | Status: AC | PRN
  Administered 2022-10-21: 19.9 via INTRAVENOUS

## 2022-10-24 DIAGNOSIS — L400 Psoriasis vulgaris: Secondary | ICD-10-CM | POA: Diagnosis not present

## 2022-10-24 DIAGNOSIS — L821 Other seborrheic keratosis: Secondary | ICD-10-CM | POA: Diagnosis not present

## 2022-10-24 DIAGNOSIS — L820 Inflamed seborrheic keratosis: Secondary | ICD-10-CM | POA: Diagnosis not present

## 2022-10-24 DIAGNOSIS — Z79899 Other long term (current) drug therapy: Secondary | ICD-10-CM | POA: Diagnosis not present

## 2022-11-02 DIAGNOSIS — L400 Psoriasis vulgaris: Secondary | ICD-10-CM | POA: Diagnosis not present

## 2022-11-08 DIAGNOSIS — M2042 Other hammer toe(s) (acquired), left foot: Secondary | ICD-10-CM | POA: Diagnosis not present

## 2022-11-08 DIAGNOSIS — M2041 Other hammer toe(s) (acquired), right foot: Secondary | ICD-10-CM | POA: Diagnosis not present

## 2022-11-08 DIAGNOSIS — R269 Unspecified abnormalities of gait and mobility: Secondary | ICD-10-CM | POA: Diagnosis not present

## 2022-11-08 DIAGNOSIS — L565 Disseminated superficial actinic porokeratosis (DSAP): Secondary | ICD-10-CM | POA: Diagnosis not present

## 2022-11-08 DIAGNOSIS — M216X1 Other acquired deformities of right foot: Secondary | ICD-10-CM | POA: Diagnosis not present

## 2022-11-08 DIAGNOSIS — M2170 Unequal limb length (acquired), unspecified site: Secondary | ICD-10-CM | POA: Diagnosis not present

## 2022-11-08 DIAGNOSIS — M216X2 Other acquired deformities of left foot: Secondary | ICD-10-CM | POA: Diagnosis not present

## 2022-11-09 DIAGNOSIS — Z96641 Presence of right artificial hip joint: Secondary | ICD-10-CM | POA: Diagnosis not present

## 2022-11-09 DIAGNOSIS — M25551 Pain in right hip: Secondary | ICD-10-CM | POA: Diagnosis not present

## 2022-11-12 ENCOUNTER — Telehealth: Payer: Self-pay | Admitting: Family

## 2022-11-12 DIAGNOSIS — Z720 Tobacco use: Secondary | ICD-10-CM

## 2022-11-12 NOTE — Telephone Encounter (Signed)
See mychart.  

## 2022-11-17 ENCOUNTER — Ambulatory Visit (HOSPITAL_BASED_OUTPATIENT_CLINIC_OR_DEPARTMENT_OTHER): Payer: Medicare HMO

## 2022-11-21 ENCOUNTER — Ambulatory Visit (HOSPITAL_BASED_OUTPATIENT_CLINIC_OR_DEPARTMENT_OTHER)
Admission: RE | Admit: 2022-11-21 | Discharge: 2022-11-21 | Disposition: A | Discharge status: Home / Self Care | Payer: Medicare HMO | Source: Ambulatory Visit | Attending: Family | Admitting: Family

## 2022-11-21 DIAGNOSIS — R69 Illness, unspecified: Secondary | ICD-10-CM | POA: Diagnosis not present

## 2022-11-21 DIAGNOSIS — F17210 Nicotine dependence, cigarettes, uncomplicated: Secondary | ICD-10-CM | POA: Diagnosis not present

## 2022-11-21 DIAGNOSIS — J432 Centrilobular emphysema: Secondary | ICD-10-CM | POA: Diagnosis not present

## 2022-11-21 DIAGNOSIS — I2510 Atherosclerotic heart disease of native coronary artery without angina pectoris: Secondary | ICD-10-CM | POA: Insufficient documentation

## 2022-11-21 DIAGNOSIS — Z122 Encounter for screening for malignant neoplasm of respiratory organs: Secondary | ICD-10-CM | POA: Diagnosis not present

## 2022-11-21 DIAGNOSIS — R918 Other nonspecific abnormal finding of lung field: Secondary | ICD-10-CM | POA: Diagnosis not present

## 2022-11-21 DIAGNOSIS — I700 Atherosclerosis of aorta: Secondary | ICD-10-CM | POA: Diagnosis not present

## 2022-11-21 DIAGNOSIS — Z720 Tobacco use: Secondary | ICD-10-CM

## 2022-12-05 DIAGNOSIS — L400 Psoriasis vulgaris: Secondary | ICD-10-CM | POA: Diagnosis not present

## 2022-12-08 DIAGNOSIS — M4317 Spondylolisthesis, lumbosacral region: Secondary | ICD-10-CM | POA: Diagnosis not present

## 2022-12-22 DIAGNOSIS — Z0100 Encounter for examination of eyes and vision without abnormal findings: Secondary | ICD-10-CM | POA: Diagnosis not present

## 2022-12-22 DIAGNOSIS — H401133 Primary open-angle glaucoma, bilateral, severe stage: Secondary | ICD-10-CM | POA: Diagnosis not present

## 2022-12-28 DIAGNOSIS — M48061 Spinal stenosis, lumbar region without neurogenic claudication: Secondary | ICD-10-CM | POA: Diagnosis not present

## 2022-12-28 DIAGNOSIS — M5416 Radiculopathy, lumbar region: Secondary | ICD-10-CM | POA: Diagnosis not present

## 2022-12-28 DIAGNOSIS — M545 Low back pain, unspecified: Secondary | ICD-10-CM | POA: Diagnosis not present

## 2022-12-28 DIAGNOSIS — R202 Paresthesia of skin: Secondary | ICD-10-CM | POA: Diagnosis not present

## 2022-12-28 DIAGNOSIS — R200 Anesthesia of skin: Secondary | ICD-10-CM | POA: Diagnosis not present

## 2023-01-04 DIAGNOSIS — M4317 Spondylolisthesis, lumbosacral region: Secondary | ICD-10-CM | POA: Diagnosis not present

## 2023-01-04 DIAGNOSIS — M4316 Spondylolisthesis, lumbar region: Secondary | ICD-10-CM | POA: Diagnosis not present

## 2023-01-04 DIAGNOSIS — M5136 Other intervertebral disc degeneration, lumbar region: Secondary | ICD-10-CM | POA: Diagnosis not present

## 2023-01-10 DIAGNOSIS — M5416 Radiculopathy, lumbar region: Secondary | ICD-10-CM | POA: Diagnosis not present

## 2023-01-17 DIAGNOSIS — H401133 Primary open-angle glaucoma, bilateral, severe stage: Secondary | ICD-10-CM | POA: Diagnosis not present

## 2023-02-02 DIAGNOSIS — M4316 Spondylolisthesis, lumbar region: Secondary | ICD-10-CM | POA: Diagnosis not present

## 2023-02-02 DIAGNOSIS — M5416 Radiculopathy, lumbar region: Secondary | ICD-10-CM | POA: Diagnosis not present

## 2023-02-02 DIAGNOSIS — M5136 Other intervertebral disc degeneration, lumbar region: Secondary | ICD-10-CM | POA: Diagnosis not present

## 2023-02-07 DIAGNOSIS — M2170 Unequal limb length (acquired), unspecified site: Secondary | ICD-10-CM | POA: Diagnosis not present

## 2023-02-07 DIAGNOSIS — M2041 Other hammer toe(s) (acquired), right foot: Secondary | ICD-10-CM | POA: Diagnosis not present

## 2023-02-07 DIAGNOSIS — M216X2 Other acquired deformities of left foot: Secondary | ICD-10-CM | POA: Diagnosis not present

## 2023-02-07 DIAGNOSIS — M2042 Other hammer toe(s) (acquired), left foot: Secondary | ICD-10-CM | POA: Diagnosis not present

## 2023-02-07 DIAGNOSIS — R269 Unspecified abnormalities of gait and mobility: Secondary | ICD-10-CM | POA: Diagnosis not present

## 2023-02-07 DIAGNOSIS — L565 Disseminated superficial actinic porokeratosis (DSAP): Secondary | ICD-10-CM | POA: Diagnosis not present

## 2023-02-07 DIAGNOSIS — M216X1 Other acquired deformities of right foot: Secondary | ICD-10-CM | POA: Diagnosis not present

## 2023-02-15 ENCOUNTER — Ambulatory Visit: Payer: Medicare HMO | Attending: Neurosurgery | Admitting: Physical Therapy

## 2023-02-15 DIAGNOSIS — R262 Difficulty in walking, not elsewhere classified: Secondary | ICD-10-CM | POA: Diagnosis not present

## 2023-02-15 DIAGNOSIS — R293 Abnormal posture: Secondary | ICD-10-CM | POA: Insufficient documentation

## 2023-02-15 DIAGNOSIS — M5459 Other low back pain: Secondary | ICD-10-CM | POA: Diagnosis not present

## 2023-02-15 DIAGNOSIS — M6281 Muscle weakness (generalized): Secondary | ICD-10-CM | POA: Insufficient documentation

## 2023-02-15 NOTE — Therapy (Signed)
OUTPATIENT PHYSICAL THERAPY THORACOLUMBAR EVALUATION   Patient Name: Brett [name] MRN: [id number] DOB:12/03/1955, 67 y.o., male Today's Date: 02/15/2023  END OF SESSION:  PT End of Session - 02/15/23 0904     Visit Number 1    Date for PT Re-Evaluation 03/22/23    Authorization Type [employer]    Progress Note Due on Visit 10    PT Start Time 0803    PT Stop Time 0845    PT Time Calculation (min) 42 min    Activity Tolerance Patient tolerated treatment well    Behavior During Therapy [facility] for tasks assessed/performed             Past Medical History:  Diagnosis Date   Allergy    Arthritis    Blood transfusion without reported diagnosis    Chicken pox    Congenital hip deformity    Glaucoma    Neuromuscular disorder (HCC) 11/2019   sciatica   Past Surgical History:  Procedure Laterality Date   COLONOSCOPY  06/17/2015   Pyrtle   COLONOSCOPY  07/20/2020   Pyrtle   HAMMER TOE SURGERY  07/28/2011   Procedure: HAMMER TOE CORRECTION;  Surgeon: Loreta Ave, MD;  Location: [facility];  Service: Orthopedics;  Laterality: Right;  right 2nd and 4th toes correction hammer toe, capsulotomy metatarsal-phalangeal joints   HERNIA REPAIR     HIP SURGERY  1986 & 2010   rt total hip-8/10-multiple rt hip surgeries- had 4 prior to 1986 as a child   lumber fusion  09/2020   POLYPECTOMY     THUMB ARTHROSCOPY  2008   rt   Patient Active Problem List   Diagnosis Date Noted   Corn of foot 08/09/2022   Spondylolisthesis at L5-S1 level 09/14/2020   Cervical neck pain with evidence of disc disease 09/22/2015   Erectile dysfunction 09/22/2015   Rhinitis, allergic 09/22/2015   Bilateral low back pain without sciatica 09/22/2015   Need for shingles vaccine 09/22/2015   Smoker 09/22/2015   Degenerative disc disease, cervical 01/29/2015   Preventative health care 01/29/2015   Tobacco abuse 05/30/2013   Rosacea 05/30/2013   Eczema 05/30/2013   Glaucoma  05/30/2013   Personal history of congenital hip dysplasia 05/30/2013    PCP: Sandford Craze, NP   REFERRING PROVIDER: Julio Sicks, MD   REFERRING DIAG: [phone] (ICD-10-CM) - Lumbar adjacent segment disease with spondylolisthesis  Rationale for Evaluation and Treatment: Rehabilitation  THERAPY DIAG:  Other low back pain  Difficulty in walking, not elsewhere classified  Abnormal posture  Muscle weakness (generalized)  ONSET DATE: worsening over last year, chronic  SUBJECTIVE:  SUBJECTIVE STATEMENT: Dr. Rhina Brackett him to see if PT would help his back, not sure it will.  The pain is getting worse, had to get a rollator, hard to walk now.  At night laying down the pain radiates down both legs and makes it hard to walk.  Trying to put off more surgery but thinks its a losing battle  PERTINENT HISTORY:  S/p prior L4/5 L5-S1 decompression and fusion surgery, R hip replacement and revision to congenital hip dysplasia, smoker, chronic LBP  PAIN:  Are you having pain? Yes: NPRS scale: 5-6/10 Pain location: low back radiating down both legs, feels it most in 1st and 2nd toes.  Pain description: sharp in back, pain in feet Aggravating factors: 9-10 at worst, everything aggravates the back, laying down to sleep makes the foot pain worst Relieving factors: oxycodone  PRECAUTIONS: Fall  WEIGHT BEARING RESTRICTIONS: No  FALLS:  Has patient fallen in last 6 months? Yes. Number of falls 1 due to hip, stepped wrong while carrying garbage  LIVING ENVIRONMENT: Lives with: lives with their daughter Lives in: House/apartment Stairs: Yes: External: 1 steps; post to use to step up Has following equipment at home: Dan Humphreys - 4 wheeled  OCCUPATION: retired  PLOF: Independent  PATIENT GOALS: try to put  off surgery if possible  NEXT MD VISIT: 03/23/2023  OBJECTIVE:   DIAGNOSTIC FINDINGS:  Per MD notes MRI demonstrates chronic severe lumbar disc degeneration with degenerative scoliosis at L1-2 L2-3 and L3-4.  He appears to undergone auto fusion at L1-2 and L2-3.  He has progressive disc degeneration with some foraminal stenosis and moderate lateral recess stenosis bilaterally at L3-4.    PATIENT SURVEYS:  Modified Oswestry 29/50   SCREENING FOR RED FLAGS: Bowel or bladder incontinence: No Spinal tumors: No Cauda equina syndrome: No Compression fracture: No Abdominal aneurysm: No  COGNITION: Overall cognitive status: Within functional limits for tasks assessed     SENSATION: WFL Reports tingling/numbness in heel and 1st/2nd toes bil  MUSCLE LENGTH: Hamstrings: significant tightness in R hamstring, moderate left  POSTURE: decreased lumbar lordosis and flexed trunk   PALPATION: Tenderness throughout lumbar paraspinals.   LUMBAR ROM:   AROM eval  Flexion To knees  Extension Maintains 30 deg flexion in standing, unable to correct to neutral due to pain  Right lateral flexion Limited 75%  Left lateral flexion Limited 75%  Right rotation Limited 95%   Left rotation Limited 95%   (Blank rows = not tested)  LOWER EXTREMITY ROM:   R hip ROM limited due to prosthesis, hip flexion limited to 90 deg, limited hip IR and ER  LOWER EXTREMITY MMT:    MMT Right eval Left eval  Hip flexion 4+ 4+  Hip extension 4 4+  Hip abduction 4 4  Hip adduction 4 4  Knee flexion 4 4+  Knee extension 4+ 5  Ankle dorsiflexion 5 (NWB) 5 NWB  Ankle plantarflexion     (Blank rows = not tested)  FUNCTIONAL TESTS:  30 seconds chair stand test 3 times, with UE assist and table at 22 inches.   GAIT: Distance walked: 69' Assistive device utilized: None Level of assistance: SBA Comments: arrives wearing back brace, forward flexed posture, frequent touches to wall and furniture for support,  reports using 4WRW for community navigation and furniture surfing at home.  Unsteady and visually slow gait speed.   TODAY'S TREATMENT:  DATE:   02/15/23  Eval Self Care:  See patient education   PATIENT EDUCATION:  Education details: findings, POC Person educated: Patient Education method: Explanation Education comprehension: verbalized understanding  HOME EXERCISE PROGRAM: TBD  ASSESSMENT:  CLINICAL IMPRESSION: URI TURNBOUGH is a 67 y.o. male who was seen today for physical therapy evaluation and treatment for severe constant chronic low back pain due to degeneration of L3-4 after fusion of L4-5 L5-S1.  He reports pain radiating down both legs worsening when laying down, and constant back pain otherwise, worsening with activity.   He also has limited hip mobility and weakness on R due to congenital hip dysplasia, hip replacement and revision.  He has not had physical therapy for his back before.  [name] would benefit from skilled physical therapy to decrease pain and improve core strength.  We did discuss different modalities (he has TENS at home) and TrDN which he is interested in trying.   OBJECTIVE IMPAIRMENTS: Abnormal gait, decreased activity tolerance, decreased mobility, difficulty walking, decreased ROM, decreased strength, hypomobility, increased fascial restrictions, increased muscle spasms, impaired flexibility, postural dysfunction, and pain.   ACTIVITY LIMITATIONS: carrying, lifting, bending, standing, sleeping, transfers, and locomotion level  PARTICIPATION LIMITATIONS: meal prep, cleaning, laundry, and community activity  PERSONAL FACTORS: Past/current experiences, Time since onset of injury/illness/exacerbation, and 1-2 comorbidities: S/p prior L4/5 L5-S1 decompression and fusion surgery, R hip replacement and revision to congenital  hip dysplasia, smoker, chronic LBP  are also affecting patient's functional outcome.   REHAB POTENTIAL: Good  CLINICAL DECISION MAKING: Evolving/moderate complexity  EVALUATION COMPLEXITY: Moderate   GOALS: Goals reviewed with patient? Yes  SHORT TERM GOALS: Target date: 03/01/2023   Patient will be independent with initial HEP.  Baseline:  Goal status: INITIAL   LONG TERM GOALS: Target date: 03/22/2023    Patient will be independent with advanced/ongoing HEP to improve outcomes and carryover.  Baseline:  Goal status: INITIAL  2.  Patient will report 50% improvement in low back pain to improve QOL.  Baseline:  Goal status: INITIAL  3. Patient will report 50% decrease in leg pain at night to improve sleep.    Baseline:  Goal status: INITIAL  4.  Patient will demonstrate improved functional strength as demonstrated by completing 5 sit to stands in 30 seconds. Baseline: 3 reps with UE assist, table height 22 inches Goal status: INITIAL  5.  Patient will report 6 points improvement on Modified Oswestry to demonstrate improved functional ability.  Baseline: 29/50 Goal status: INITIAL   6.  Patient will tolerate 30 min of walking with AD to exercise for health.  Baseline: painful, can only do 1 lap Goal status: INITIAL  7.  Patient to demonstrate ability to achieve and maintain good spinal alignment/posturing and  needed for daily activities. Baseline: forward flexed posture ~ 30 deg.  Goal status: INITIAL  PLAN:  PT FREQUENCY: 1-2x/week  PT DURATION: 4 weeks  PLANNED INTERVENTIONS: Therapeutic exercises, Therapeutic activity, Neuromuscular re-education, Balance training, Gait training, Patient/Family education, Self Care, Joint mobilization, Dry Needling, Electrical stimulation, Spinal mobilization, Cryotherapy, Moist heat, Taping, Traction, Manual therapy, and Re-evaluation.  PLAN FOR NEXT SESSION: start gentle neutral spine exercises, needs HEP, interested in  trying dry needling  - caution didn't place in prone position during IE and has L4-5, L5-S1 fusion.   Jena Gauss, PT 02/15/2023, 9:18 AM

## 2023-02-15 NOTE — Addendum Note (Signed)
Addended by: Jena Gauss on: 02/15/2023 09:21 AM   Modules accepted: Orders

## 2023-02-16 ENCOUNTER — Ambulatory Visit (INDEPENDENT_AMBULATORY_CARE_PROVIDER_SITE_OTHER): Payer: Medicare HMO | Admitting: *Deleted

## 2023-02-16 DIAGNOSIS — Z0000 Encounter for general adult medical examination without abnormal findings: Secondary | ICD-10-CM | POA: Diagnosis not present

## 2023-02-16 NOTE — Progress Notes (Signed)
Subjective:   Brett [name] is a 67 y.o. male who presents for Medicare Annual/Subsequent preventive examination.  Visit Complete: Virtual  I connected with  Brett [name] on 02/16/23 by a audio enabled telemedicine application and verified that I am speaking with the correct person using two identifiers.  Patient Location: Home  Provider Location: Office/Clinic  I discussed the limitations of evaluation and management by telemedicine. The patient expressed understanding and agreed to proceed.  Patient Medicare AWV questionnaire was completed by the patient on 02/10/23; I have confirmed that all information answered by patient is correct and no changes since this date.  Review of Systems     Cardiac Risk Factors include: advanced age (>45men, >21 women);male gender     Objective:    Today's Vitals   There is no height or weight on file to calculate BMI.     02/16/2023   10:27 AM 02/15/2023    8:07 AM 09/22/2022   12:45 PM 11/01/2021    8:37 AM 02/18/2021    7:46 AM 09/14/2020    2:00 PM 09/10/2020    8:17 AM  Advanced Directives  Does Patient Have a Medical Advance Directive? Yes Yes No Yes No No No  Type of Advance Directive Living will Healthcare Power of [employer] Power of Attorney     Does patient want to make changes to medical advance directive? No - Patient declined No - Patient declined       Copy of Healthcare Power of Attorney in Chart?  No - copy requested  No - copy requested     Would patient like information on creating a medical advance directive?   No - Patient declined  No - Patient declined No - Patient declined No - Patient declined    Current Medications (verified) Outpatient Encounter Medications as of 02/16/2023  Medication Sig   Ascorbic Acid (VITAMIN C) 1000 MG tablet Take 1,000 mg by mouth daily.   b complex vitamins capsule Take 1 capsule by mouth daily.   diclofenac (VOLTAREN) 75 MG EC tablet Take 75 mg by mouth 2 (two) times daily.    ibuprofen (ADVIL) 200 MG tablet Take 200 mg by mouth every 6 (six) hours as needed.   Multiple Vitamin (MULTIVITAMIN) tablet Take 1 tablet by mouth daily.   Omega-3 Fatty Acids (FISH OIL PO) Take 1 capsule by mouth daily.   Oxycodone HCl 10 MG TABS TAKE 1 TABLET BY MOUTH EVERY 6 HOURS AS NEEDED FOR PAIN Oral for 10 Days   sildenafil (REVATIO) 20 MG tablet Take 1-2 tablets by mouth 30 minutes prior to sexual activity.   TRAVATAN Z 0.004 % SOLN ophthalmic solution Place 1 drop into both eyes daily.   [DISCONTINUED] acetaminophen (TYLENOL) 500 MG tablet Take 1 tablet (500 mg total) by mouth every 6 (six) hours as needed.   [DISCONTINUED] benzonatate (TESSALON) 200 MG capsule Take 1 capsule (200 mg total) by mouth 2 (two) times daily as needed for cough. (Patient not taking: Reported on 02/15/2023)   No facility-administered encounter medications on file as of 02/16/2023.    Allergies (verified) Patient has no known allergies.   History: Past Medical History:  Diagnosis Date   Allergy    Arthritis    Blood transfusion without reported diagnosis    Chicken pox    Congenital hip deformity    Glaucoma    Neuromuscular disorder (HCC) 11/2019   sciatica   Past Surgical History:  Procedure Laterality Date  COLONOSCOPY  06/17/2015   Pyrtle   COLONOSCOPY  07/20/2020   Pyrtle   HAMMER TOE SURGERY  07/28/2011   Procedure: HAMMER TOE CORRECTION;  Surgeon: Loreta Ave, MD;  Location: [facility];  Service: Orthopedics;  Laterality: Right;  right 2nd and 4th toes correction hammer toe, capsulotomy metatarsal-phalangeal joints   HERNIA REPAIR     HIP SURGERY  1986 & 2010   rt total hip-8/10-multiple rt hip surgeries- had 4 prior to 1986 as a child   JOINT REPLACEMENT  2010   lumber fusion  09/2020   POLYPECTOMY     SPINE SURGERY  09/14/2020   THUMB ARTHROSCOPY  2008   rt   Family History  Problem Relation Age of Onset   Cancer Mother 47       history of colon cancer    Rosacea Mother    Colon cancer Mother 42   Colon polyps Mother    Rosacea Father    Lung disease Father        ?pulmonary fibrosis   Alcohol abuse Brother    Depression Brother    Colon cancer Maternal Grandmother    Endocrine tumor Daughter        pituitary tumor, POTTS   Thyroid disease Son        ?hyperthyroid   Cancer Cousin        colon   Colon cancer Cousin    Esophageal cancer Neg Hx    Rectal cancer Neg Hx    Stomach cancer Neg Hx    Social History   Socioeconomic History   Marital status: Widowed    Spouse name: Not on file   Number of children: Not on file   Years of education: Not on file   Highest education level: Not on file  Occupational History   Not on file  Tobacco Use   Smoking status: Every Day    Current packs/day: 0.50    Average packs/day: 0.5 packs/day for 49.5 years (24.8 ttl pk-yrs)    Types: Cigarettes    Start date: 51   Smokeless tobacco: Never   Tobacco comments:    0.5 ppd  Vaping Use   Vaping status: Never Used  Substance and Sexual Activity   Alcohol use: Yes    Alcohol/week: 10.0 standard drinks of alcohol    Types: 10 Standard drinks or equivalent per week   Drug use: No   Sexual activity: Not Currently    Birth control/protection: None  Other Topics Concern   Not on file  Social History Narrative   Quality control Tech- gauges/callibrations.  (Retired)   Some colleg/tech school   wife passed   3 grown children (oldest daughter is living with them) youngest daughter lives in [place].  Curator at [employer].  Son lives near [place]- framing/art.   Social Determinants of Health   Financial Resource Strain: Low Risk  (02/10/2023)   Overall Financial Resource Strain (CARDIA)    Difficulty of Paying Living Expenses: Not very hard  Food Insecurity: No Food Insecurity (02/10/2023)   Hunger Vital Sign    Worried About Running Out of Food in the Last Year: Never true    Ran Out of Food in the Last Year: Never true   Transportation Needs: No Transportation Needs (02/10/2023)   PRAPARE - Administrator, Civil Service (Medical): No    Lack of Transportation (Non-Medical): No  Physical Activity: Inactive (02/10/2023)   Exercise Vital Sign  Days of Exercise per Week: 0 days    Minutes of Exercise per Session: 10 min  Stress: No Stress Concern Present (02/10/2023)   [employer] of Occupational Health - Occupational Stress Questionnaire    Feeling of Stress : Not at all  Social Connections: Unknown (02/10/2023)   Social Connection and Isolation Panel [NHANES]    Frequency of Communication with Friends and Family: More than three times a week    Frequency of Social Gatherings with Friends and Family: Twice a week    Attends Religious Services: Not on Marketing executive or Organizations: Yes    Attends Banker Meetings: More than 4 times per year    Marital Status: Widowed    Tobacco Counseling Ready to quit: Not Answered Counseling given: Not Answered Tobacco comments: 0.5 ppd   Clinical Intake:  Pre-visit preparation completed: Yes  Pain : No/denies pain  Nutritional Risks: None Diabetes: No  How often do you need to have someone help you when you read instructions, pamphlets, or other written materials from your doctor or pharmacy?: 2 - Rarely  Interpreter Needed?: No  Information entered by :: Donne Anon, CMA   Activities of Daily Living    02/10/2023    3:18 PM  In your present state of health, do you have any difficulty performing the following activities:  Hearing? 1  Vision? 0  Difficulty concentrating or making decisions? 0  Walking or climbing stairs? 1  Dressing or bathing? 0  Doing errands, shopping? 0  Preparing Food and eating ? N  Using the Toilet? N  In the past six months, have you accidently leaked urine? N  Do you have problems with loss of bowel control? N  Managing your Medications? N  Managing your Finances? N   Housekeeping or managing your Housekeeping? N    Patient Care Team: Sandford Craze, NP as PCP - General (Internal Medicine)  Indicate any recent Medical Services you may have received from other than [facility] providers in the past year (date may be approximate).     Assessment:   This is a routine wellness examination for Brett [name].  Hearing/Vision screen No results found.  Dietary issues and exercise activities discussed:     Goals Addressed   None    Depression Screen    02/16/2023   10:34 AM 11/01/2021    8:42 AM 04/26/2021    7:06 AM 04/24/2020    7:03 AM 03/24/2017   11:59 AM 10/23/2015   10:45 AM  PHQ 2/9 Scores  PHQ - 2 Score 1 0 0 0 0 0  PHQ- 9 Score     2   Exception Documentation      Patient refusal    Fall Risk    02/10/2023    3:18 PM 11/01/2021    8:39 AM 04/26/2021    7:06 AM 04/24/2020    7:03 AM 10/23/2015   10:45 AM  Fall Risk   Falls in the past year? 1 1 1  0 No  Number falls in past yr: 0 0 0 0   Injury with Fall? 1 0 0 0   Risk for fall due to : Impaired balance/gait;Orthopedic patient      Follow up Falls evaluation completed Falls prevention discussed       MEDICARE RISK AT HOME:   TIMED UP AND GO:  Was the test performed?  No    Cognitive Function:        02/16/2023  10:40 AM  6CIT Screen  What Year? 0 points  What month? 0 points  What time? 0 points  Count back from 20 0 points  Months in reverse 0 points  Repeat phrase 2 points  Total Score 2 points    Immunizations Immunization History  Administered Date(s) Administered   COVID-19, mRNA, vaccine(Comirnaty)12 years and older 05/19/2022   Fluad Quad(high Dose 65+) 04/24/2020, 04/26/2021, 05/27/2022   Influenza,inj,Quad PF,6+ Mos 04/13/2018, 04/19/2019   Influenza-Unspecified 05/09/2015, 06/08/2017, 05/20/2022   PFIZER Comirnaty(Gray Top)Covid-19 Tri-Sucrose Vaccine 12/29/2020   PFIZER(Purple Top)SARS-COV-2 Vaccination 09/12/2019, 10/07/2019, 05/12/2020, 05/27/2022    PNEUMOCOCCAL CONJUGATE-20 08/09/2022   Pfizer Covid-19 Vaccine Bivalent Booster 8yrs & up 04/28/2021   Pneumococcal Polysaccharide-23 04/24/2020   RSV IGIV 06/03/2022   Respiratory Syncytial Virus Vaccine,Recomb Aduvanted(Arexvy) 05/27/2022   Tdap 01/29/2015   Zoster Recombinant(Shingrix) 04/13/2018, 06/19/2018   Zoster, Live 09/22/2015    TDAP status: Up to date  Flu Vaccine status: Up to date  Pneumococcal vaccine status: Up to date  Covid-19 vaccine status: Information provided on how to obtain vaccines.   Qualifies for Shingles Vaccine? Yes   Zostavax completed Yes   Shingrix Completed?: Yes  Screening Tests Health Maintenance  Topic Date Due   COVID-19 Vaccine (7 - 2023-24 season) 07/22/2022   Medicare Annual Wellness (AWV)  11/02/2022   INFLUENZA VACCINE  03/09/2023   Lung Cancer Screening  11/21/2023   Colonoscopy  02/19/2024   DTaP/Tdap/Td (2 - Td or Tdap) 01/28/2025   Pneumonia Vaccine 101+ Years old  Completed   Hepatitis C Screening  Completed   Zoster Vaccines- Shingrix  Completed   HPV VACCINES  Aged Out    Health Maintenance  Health Maintenance Due  Topic Date Due   COVID-19 Vaccine (7 - 2023-24 season) 07/22/2022   Medicare Annual Wellness (AWV)  11/02/2022    Colorectal cancer screening: Type of screening: Colonoscopy. Completed 02/18/21. Repeat every 3 years  Lung Cancer Screening: (Low Dose CT Chest recommended if Age 64-80 years, 20 pack-year currently smoking OR have quit w/in 15years.) does qualify.   Lung Cancer Screening Referral: last scan completed on 11/21/22  Additional Screening:  Hepatitis C Screening: does qualify; Completed 10/23/15  Vision Screening: Recommended annual ophthalmology exams for early detection of glaucoma and other disorders of the eye. Is the patient up to date with their annual eye exam?  Yes  Who is the provider or what is the name of the office in which the patient attends annual eye exams? Dr. Lissa Morales [facility] If pt is not established with a provider, would they like to be referred to a provider to establish care? No .   Dental Screening: Recommended annual dental exams for proper oral hygiene  Diabetic Foot Exam: N/a  Community Resource Referral / Chronic Care Management: CRR required this visit?  No   CCM required this visit?  No     Plan:     I have personally reviewed and noted the following in the patient's chart:   Medical and social history Use of alcohol, tobacco or illicit drugs  Current medications and supplements including opioid prescriptions. Patient is currently taking opioid prescriptions. Information provided to patient regarding non-opioid alternatives. Patient advised to discuss non-opioid treatment plan with their provider. Functional ability and status Nutritional status Physical activity Advanced directives List of other physicians Hospitalizations, surgeries, and ER visits in previous 12 months Vitals Screenings to include cognitive, depression, and falls Referrals and appointments  In addition, I have  reviewed and discussed with patient certain preventive protocols, quality metrics, and best practice recommendations. A written personalized care plan for preventive services as well as general preventive health recommendations were provided to patient.     Donne Anon, CMA   02/16/2023   After Visit Summary: (MyChart) Due to this being a telephonic visit, the after visit summary with patients personalized plan was offered to patient via MyChart   Nurse Notes: None

## 2023-02-16 NOTE — Patient Instructions (Signed)
[name] , Thank you for taking time to come for your Medicare Wellness Visit. I appreciate your ongoing commitment to your health goals. Please review the following plan we discussed and let me know if I can assist you in the future.     This is a list of the screening recommended for you and due dates:  Health Maintenance  Topic Date Due   COVID-19 Vaccine (7 - 2023-24 season) 07/22/2022   Flu Shot  03/09/2023   Screening for Lung Cancer  11/21/2023   Medicare Annual Wellness Visit  02/16/2024   Colon Cancer Screening  02/19/2024   DTaP/Tdap/Td vaccine (2 - Td or Tdap) 01/28/2025   Pneumonia Vaccine  Completed   Hepatitis C Screening  Completed   Zoster (Shingles) Vaccine  Completed   HPV Vaccine  Aged Out    Next appointment: Follow up in one year for your annual wellness visit.   Preventive Care 44 Years and Older, Male Preventive care refers to lifestyle choices and visits with your health care provider that can promote health and wellness. What does preventive care include? A yearly physical exam. This is also called an annual well check. Dental exams once or twice a year. Routine eye exams. Ask your health care provider how often you should have your eyes checked. Personal lifestyle choices, including: Daily care of your teeth and gums. Regular physical activity. Eating a healthy diet. Avoiding tobacco and drug use. Limiting alcohol use. Practicing safe sex. Taking low doses of aspirin every day. Taking vitamin and mineral supplements as recommended by your health care provider. What happens during an annual well check? The services and screenings done by your health care provider during your annual well check will depend on your age, overall health, lifestyle risk factors, and family history of disease. Counseling  Your health care provider may ask you questions about your: Alcohol use. Tobacco use. Drug use. Emotional well-being. Home and relationship  well-being. Sexual activity. Eating habits. History of falls. Memory and ability to understand (cognition). Work and work Astronomer. Screening  You may have the following tests or measurements: Height, weight, and BMI. Blood pressure. Lipid and cholesterol levels. These may be checked every 5 years, or more frequently if you are over 16 years old. Skin check. Lung cancer screening. You may have this screening every year starting at age 94 if you have a 30-pack-year history of smoking and currently smoke or have quit within the past 15 years. Fecal occult blood test (FOBT) of the stool. You may have this test every year starting at age 53. Flexible sigmoidoscopy or colonoscopy. You may have a sigmoidoscopy every 5 years or a colonoscopy every 10 years starting at age 103. Prostate cancer screening. Recommendations will vary depending on your family history and other risks. Hepatitis C blood test. Hepatitis B blood test. Sexually transmitted disease (STD) testing. Diabetes screening. This is done by checking your blood sugar (glucose) after you have not eaten for a while (fasting). You may have this done every 1-3 years. Abdominal aortic aneurysm (AAA) screening. You may need this if you are a current or former smoker. Osteoporosis. You may be screened starting at age 35 if you are at high risk. Talk with your health care provider about your test results, treatment options, and if necessary, the need for more tests. Vaccines  Your health care provider may recommend certain vaccines, such as: Influenza vaccine. This is recommended every year. Tetanus, diphtheria, and acellular pertussis (Tdap, Td) vaccine. You  may need a Td booster every 10 years. Zoster vaccine. You may need this after age 34. Pneumococcal 13-valent conjugate (PCV13) vaccine. One dose is recommended after age 58. Pneumococcal polysaccharide (PPSV23) vaccine. One dose is recommended after age 61. Talk to your health care  provider about which screenings and vaccines you need and how often you need them. This information is not intended to replace advice given to you by your health care provider. Make sure you discuss any questions you have with your health care provider. Document Released: 08/21/2015 Document Revised: 04/13/2016 Document Reviewed: 05/26/2015 Elsevier Interactive Patient Education  2017 [employer].  Fall Prevention in the Home Falls can cause injuries. They can happen to people of all ages. There are many things you can do to make your home safe and to help prevent falls. What can I do on the outside of my home? Regularly fix the edges of walkways and driveways and fix any cracks. Remove anything that might make you trip as you walk through a door, such as a raised step or threshold. Trim any bushes or trees on the path to your home. Use bright outdoor lighting. Clear any walking paths of anything that might make someone trip, such as rocks or tools. Regularly check to see if handrails are loose or broken. Make sure that both sides of any steps have handrails. Any raised decks and porches should have guardrails on the edges. Have any leaves, snow, or ice cleared regularly. Use sand or salt on walking paths during winter. Clean up any spills in your garage right away. This includes oil or grease spills. What can I do in the bathroom? Use night lights. Install grab bars by the toilet and in the tub and shower. Do not use towel bars as grab bars. Use non-skid mats or decals in the tub or shower. If you need to sit down in the shower, use a plastic, non-slip stool. Keep the floor dry. Clean up any water that spills on the floor as soon as it happens. Remove soap buildup in the tub or shower regularly. Attach bath mats securely with double-sided non-slip rug tape. Do not have throw rugs and other things on the floor that can make you trip. What can I do in the bedroom? Use night lights. Make  sure that you have a light by your bed that is easy to reach. Do not use any sheets or blankets that are too big for your bed. They should not hang down onto the floor. Have a firm chair that has side arms. You can use this for support while you get dressed. Do not have throw rugs and other things on the floor that can make you trip. What can I do in the kitchen? Clean up any spills right away. Avoid walking on wet floors. Keep items that you use a lot in easy-to-reach places. If you need to reach something above you, use a strong step stool that has a grab bar. Keep electrical cords out of the way. Do not use floor polish or wax that makes floors slippery. If you must use wax, use non-skid floor wax. Do not have throw rugs and other things on the floor that can make you trip. What can I do with my stairs? Do not leave any items on the stairs. Make sure that there are handrails on both sides of the stairs and use them. Fix handrails that are broken or loose. Make sure that handrails are as long as the  stairways. Check any carpeting to make sure that it is firmly attached to the stairs. Fix any carpet that is loose or worn. Avoid having throw rugs at the top or bottom of the stairs. If you do have throw rugs, attach them to the floor with carpet tape. Make sure that you have a light switch at the top of the stairs and the bottom of the stairs. If you do not have them, ask someone to add them for you. What else can I do to help prevent falls? Wear shoes that: Do not have high heels. Have rubber bottoms. Are comfortable and fit you well. Are closed at the toe. Do not wear sandals. If you use a stepladder: Make sure that it is fully opened. Do not climb a closed stepladder. Make sure that both sides of the stepladder are locked into place. Ask someone to hold it for you, if possible. Clearly mark and make sure that you can see: Any grab bars or handrails. First and last steps. Where the  edge of each step is. Use tools that help you move around (mobility aids) if they are needed. These include: Canes. Walkers. Scooters. Crutches. Turn on the lights when you go into a dark area. Replace any light bulbs as soon as they burn out. Set up your furniture so you have a clear path. Avoid moving your furniture around. If any of your floors are uneven, fix them. If there are any pets around you, be aware of where they are. Review your medicines with your doctor. Some medicines can make you feel dizzy. This can increase your chance of falling. Ask your doctor what other things that you can do to help prevent falls. This information is not intended to replace advice given to you by your health care provider. Make sure you discuss any questions you have with your health care provider. Document Released: 05/21/2009 Document Revised: 12/31/2015 Document Reviewed: 08/29/2014 Elsevier Interactive Patient Education  2017 [employer].

## 2023-02-19 NOTE — Therapy (Signed)
OUTPATIENT PHYSICAL THERAPY THORACOLUMBAR TREATMENT   Patient Name: Brett [name] MRN: [id number] DOB:1955-11-02, 67 y.o., male Today's Date: 02/20/2023  END OF SESSION:  PT End of Session - 02/20/23 0757     Visit Number 2    Date for PT Re-Evaluation 03/22/23    Authorization Type [employer]    Progress Note Due on Visit 10    PT Start Time 0800    PT Stop Time 0843    PT Time Calculation (min) 43 min    Activity Tolerance Patient tolerated treatment well    Behavior During Therapy [facility] for tasks assessed/performed              Past Medical History:  Diagnosis Date   Allergy    Arthritis    Blood transfusion without reported diagnosis    Chicken pox    Congenital hip deformity    Glaucoma    Neuromuscular disorder (HCC) 11/2019   sciatica   Past Surgical History:  Procedure Laterality Date   COLONOSCOPY  06/17/2015   Pyrtle   COLONOSCOPY  07/20/2020   Pyrtle   HAMMER TOE SURGERY  07/28/2011   Procedure: HAMMER TOE CORRECTION;  Surgeon: Loreta Ave, MD;  Location: [facility];  Service: Orthopedics;  Laterality: Right;  right 2nd and 4th toes correction hammer toe, capsulotomy metatarsal-phalangeal joints   HERNIA REPAIR     HIP SURGERY  1986 & 2010   rt total hip-8/10-multiple rt hip surgeries- had 4 prior to 1986 as a child   JOINT REPLACEMENT  2010   lumber fusion  09/2020   POLYPECTOMY     SPINE SURGERY  09/14/2020   THUMB ARTHROSCOPY  2008   rt   Patient Active Problem List   Diagnosis Date Noted   Corn of foot 08/09/2022   Spondylolisthesis at L5-S1 level 09/14/2020   Cervical neck pain with evidence of disc disease 09/22/2015   Erectile dysfunction 09/22/2015   Rhinitis, allergic 09/22/2015   Bilateral low back pain without sciatica 09/22/2015   Need for shingles vaccine 09/22/2015   Smoker 09/22/2015   Degenerative disc disease, cervical 01/29/2015   Preventative health care 01/29/2015   Tobacco abuse 05/30/2013    Rosacea 05/30/2013   Eczema 05/30/2013   Glaucoma 05/30/2013   Personal history of congenital hip dysplasia 05/30/2013    PCP: Sandford Craze, NP   REFERRING PROVIDER: Julio Sicks, MD   REFERRING DIAG: [phone] (ICD-10-CM) - Lumbar adjacent segment disease with spondylolisthesis  Rationale for Evaluation and Treatment: Rehabilitation  THERAPY DIAG:  Other low back pain  Difficulty in walking, not elsewhere classified  Abnormal posture  Muscle weakness (generalized)  ONSET DATE: worsening over last year, chronic  SUBJECTIVE:  SUBJECTIVE STATEMENT: Walking up here pain is up to 8/10.   PERTINENT HISTORY:  S/p prior L4/5 L5-S1 decompression and fusion surgery, R hip replacement and revision to congenital hip dysplasia, smoker, chronic LBP  PAIN:  Are you having pain? Yes: NPRS scale: 5-6/10 Pain location: low back radiating down both legs, feels it most in 1st and 2nd toes.  Pain description: sharp in back, pain in feet Aggravating factors: 9-10 at worst, everything aggravates the back, laying down to sleep makes the foot pain worst Relieving factors: oxycodone  PRECAUTIONS: Fall  WEIGHT BEARING RESTRICTIONS: No  FALLS:  Has patient fallen in last 6 months? Yes. Number of falls 1 due to hip, stepped wrong while carrying garbage  LIVING ENVIRONMENT: Lives with: lives with their daughter Lives in: House/apartment Stairs: Yes: External: 1 steps; post to use to step up Has following equipment at home: Dan Humphreys - 4 wheeled  OCCUPATION: retired  PLOF: Independent  PATIENT GOALS: try to put off surgery if possible  NEXT MD VISIT: 03/23/2023  OBJECTIVE:   DIAGNOSTIC FINDINGS:  Per MD notes MRI demonstrates chronic severe lumbar disc degeneration with degenerative scoliosis  at L1-2 L2-3 and L3-4.  He appears to undergone auto fusion at L1-2 and L2-3.  He has progressive disc degeneration with some foraminal stenosis and moderate lateral recess stenosis bilaterally at L3-4.    PATIENT SURVEYS:  Modified Oswestry 29/50   SCREENING FOR RED FLAGS: Bowel or bladder incontinence: No Spinal tumors: No Cauda equina syndrome: No Compression fracture: No Abdominal aneurysm: No  COGNITION: Overall cognitive status: Within functional limits for tasks assessed     SENSATION: WFL Reports tingling/numbness in heel and 1st/2nd toes bil  MUSCLE LENGTH: Hamstrings: significant tightness in R hamstring, moderate left  POSTURE: decreased lumbar lordosis and flexed trunk   PALPATION: Tenderness throughout lumbar paraspinals.   LUMBAR ROM:   AROM eval  Flexion To knees  Extension Maintains 30 deg flexion in standing, unable to correct to neutral due to pain  Right lateral flexion Limited 75%  Left lateral flexion Limited 75%  Right rotation Limited 95%   Left rotation Limited 95%   (Blank rows = not tested)  LOWER EXTREMITY ROM:   R hip ROM limited due to prosthesis, hip flexion limited to 90 deg, limited hip IR and ER  LOWER EXTREMITY MMT:    MMT Right eval Left eval  Hip flexion 4+ 4+  Hip extension 4 4+  Hip abduction 4 4  Hip adduction 4 4  Knee flexion 4 4+  Knee extension 4+ 5  Ankle dorsiflexion 5 (NWB) 5 NWB  Ankle plantarflexion     (Blank rows = not tested)  FUNCTIONAL TESTS:  30 seconds chair stand test 3 times, with UE assist and table at 22 inches.   GAIT: Distance walked: 45' Assistive device utilized: None Level of assistance: SBA Comments: arrives wearing back brace, forward flexed posture, frequent touches to wall and furniture for support, reports using 4WRW for community navigation and furniture surfing at home.  Unsteady and visually slow gait speed.   TODAY'S TREATMENT:  DATE:  02/20/23 Nustep L3 x 3 min then decreased to L1 x 3 min Sit to stand from high mat table x 5 with intermittent UE assist LAQ x 10 ea Seated IR x 10 B Standing heel raises x 10 Standing wall extension 2x10 Manual Therapy: to decrease muscle spasm and pain and improve mobility Skilled palpation and monitoring of soft tissues during DN Trigger Point Dry-Needling  Treatment instructions: Expect mild to moderate muscle soreness. S/S of pneumothorax if dry needled over a lung field, and to seek immediate medical attention should they occur. Patient verbalized understanding of these instructions and education. Patient Consent Given: Yes Education handout provided: Yes Muscles treated: R gluteals and piriformis Electrical stimulation performed: No Parameters: N/A Treatment response/outcome: Twitch Response Elicited and Palpable Increase in Muscle Length STM to R gluteals and piriformis   02/15/23  Eval Self Care:  See patient education   PATIENT EDUCATION:  Education details: initial HEP and DN rational, procedure, outcomes, potential side effects, and recommended post-treatment exercises/activity  Person educated: Patient Education method: Explanation, Demonstration, and Handouts Education comprehension: verbalized understanding and returned demonstration  HOME EXERCISE PROGRAM: Access Code: [email or web address] URL: [email or web address] Date: 02/20/2023 Prepared by: Raynelle Fanning  Exercises - Standing Lumbar Extension at Wall - Forearms  - 4-5 x daily - 7 x weekly - 2 sets - 10 reps - Standing Heel Raise with Support  - 1 x daily - 3-4 x weekly - 3 sets - 10 reps - Seated Long Arc Quad  - 1 x daily - 7 x weekly - 3 sets - 10 reps - 5 sec hold - Sit to Stand with Counter Support  - 1 x daily - 7 x weekly - 2 sets - 5-10 reps  ASSESSMENT:  CLINICAL IMPRESSION: Mervil was able to tolerate initial TE without  complaint of increased pain.HEP issued. Initial trial of DN with excellent twitch responses in right gluteals and piriformis. Advised pt to bring his waling stick or rollator to PT as he appears unsteady much of the time until he gets moving. He continues to demonstrate potential for improvement and would benefit from continued skilled therapy to address impairments.   OBJECTIVE IMPAIRMENTS: Abnormal gait, decreased activity tolerance, decreased mobility, difficulty walking, decreased ROM, decreased strength, hypomobility, increased fascial restrictions, increased muscle spasms, impaired flexibility, postural dysfunction, and pain.   ACTIVITY LIMITATIONS: carrying, lifting, bending, standing, sleeping, transfers, and locomotion level  PARTICIPATION LIMITATIONS: meal prep, cleaning, laundry, and community activity  PERSONAL FACTORS: Past/current experiences, Time since onset of injury/illness/exacerbation, and 1-2 comorbidities: S/p prior L4/5 L5-S1 decompression and fusion surgery, R hip replacement and revision to congenital hip dysplasia, smoker, chronic LBP  are also affecting patient's functional outcome.   REHAB POTENTIAL: Good  CLINICAL DECISION MAKING: Evolving/moderate complexity  EVALUATION COMPLEXITY: Moderate   GOALS: Goals reviewed with patient? Yes  SHORT TERM GOALS: Target date: 03/01/2023   Patient will be independent with initial HEP.  Baseline:  Goal status: INITIAL   LONG TERM GOALS: Target date: 03/22/2023    Patient will be independent with advanced/ongoing HEP to improve outcomes and carryover.  Baseline:  Goal status: INITIAL  2.  Patient will report 50% improvement in low back pain to improve QOL.  Baseline:  Goal status: INITIAL  3. Patient will report 50% decrease in leg pain at night to improve sleep.    Baseline:  Goal status: INITIAL  4.  Patient will demonstrate improved functional strength as demonstrated by completing 5 sit to stands in 30  seconds. Baseline: 3 reps with UE assist, table height 22 inches Goal status: INITIAL  5.  Patient will report 6 points improvement on Modified Oswestry to demonstrate improved functional ability.  Baseline: 29/50 Goal status: INITIAL   6.  Patient will tolerate 30 min of walking with AD to exercise for health.  Baseline: painful, can only do 1 lap Goal status: INITIAL  7.  Patient to demonstrate ability to achieve and maintain good spinal alignment/posturing and  needed for daily activities. Baseline: forward flexed posture ~ 30 deg.  Goal status: INITIAL  PLAN:  PT FREQUENCY: 1-2x/week  PT DURATION: 4 weeks  PLANNED INTERVENTIONS: Therapeutic exercises, Therapeutic activity, Neuromuscular re-education, Balance training, Gait training, Patient/Family education, Self Care, Joint mobilization, Dry Needling, Electrical stimulation, Spinal mobilization, Cryotherapy, Moist heat, Taping, Traction, Manual therapy, and Re-evaluation.  PLAN FOR NEXT SESSION: Assess DN and continue as indicated, continue gentle neutral spine exercises, dry needling  - caution didn't place in prone position during IE and has L4-5, L5-S1 fusion.  Solon Palm, PT 02/20/2023, 8:48 AM  [facility] [address] [place], [place] [address] [phone]  Fax: [phone]

## 2023-02-20 ENCOUNTER — Encounter: Payer: Self-pay | Admitting: Physical Therapy

## 2023-02-20 ENCOUNTER — Ambulatory Visit: Payer: Medicare HMO | Admitting: Physical Therapy

## 2023-02-20 DIAGNOSIS — R293 Abnormal posture: Secondary | ICD-10-CM

## 2023-02-20 DIAGNOSIS — R262 Difficulty in walking, not elsewhere classified: Secondary | ICD-10-CM | POA: Diagnosis not present

## 2023-02-20 DIAGNOSIS — M5459 Other low back pain: Secondary | ICD-10-CM

## 2023-02-20 DIAGNOSIS — M6281 Muscle weakness (generalized): Secondary | ICD-10-CM

## 2023-02-22 ENCOUNTER — Encounter: Payer: Self-pay | Admitting: Physical Therapy

## 2023-02-22 ENCOUNTER — Ambulatory Visit: Payer: Medicare HMO | Admitting: Physical Therapy

## 2023-02-22 DIAGNOSIS — M5459 Other low back pain: Secondary | ICD-10-CM | POA: Diagnosis not present

## 2023-02-22 DIAGNOSIS — R262 Difficulty in walking, not elsewhere classified: Secondary | ICD-10-CM | POA: Diagnosis not present

## 2023-02-22 DIAGNOSIS — R293 Abnormal posture: Secondary | ICD-10-CM

## 2023-02-22 DIAGNOSIS — M6281 Muscle weakness (generalized): Secondary | ICD-10-CM

## 2023-02-22 NOTE — Therapy (Addendum)
OUTPATIENT PHYSICAL THERAPY THORACOLUMBAR TREATMENT   Patient Name: Brett [name] MRN: [id number] DOB:[birth year], 67 y.o., male Today's Date: 02/22/2023  END OF SESSION:  PT End of Session - 02/22/23 0807     Visit Number 3    Date for PT Re-Evaluation 03/22/23    Authorization Type [employer]    Progress Note Due on Visit 10    PT Start Time 0803    PT Stop Time 0852    PT Time Calculation (min) 49 min    Activity Tolerance Patient tolerated treatment well    Behavior During Therapy [facility] for tasks assessed/performed              Past Medical History:  Diagnosis Date   Allergy    Arthritis    Blood transfusion without reported diagnosis    Chicken pox    Congenital hip deformity    Glaucoma    Neuromuscular disorder (HCC) 11/2019   sciatica   Past Surgical History:  Procedure Laterality Date   COLONOSCOPY  06/17/2015   Pyrtle   COLONOSCOPY  07/20/2020   Pyrtle   HAMMER TOE SURGERY  07/28/2011   Procedure: HAMMER TOE CORRECTION;  Surgeon: Loreta Ave, MD;  Location: [facility];  Service: Orthopedics;  Laterality: Right;  right 2nd and 4th toes correction hammer toe, capsulotomy metatarsal-phalangeal joints   HERNIA REPAIR     HIP SURGERY  1986 & 2010   rt total hip-8/10-multiple rt hip surgeries- had 4 prior to 1986 as a child   JOINT REPLACEMENT  2010   lumber fusion  09/2020   POLYPECTOMY     SPINE SURGERY  09/14/2020   THUMB ARTHROSCOPY  2008   rt   Patient Active Problem List   Diagnosis Date Noted   Corn of foot 08/09/2022   Spondylolisthesis at L5-S1 level 09/14/2020   Cervical neck pain with evidence of disc disease 09/22/2015   Erectile dysfunction 09/22/2015   Rhinitis, allergic 09/22/2015   Bilateral low back pain without sciatica 09/22/2015   Need for shingles vaccine 09/22/2015   Smoker 09/22/2015   Degenerative disc disease, cervical 01/29/2015   Preventative health care 01/29/2015   Tobacco abuse 05/30/2013    Rosacea 05/30/2013   Eczema 05/30/2013   Glaucoma 05/30/2013   Personal history of congenital hip dysplasia 05/30/2013    PCP: Sandford Craze, NP   REFERRING PROVIDER: Julio Sicks, MD   REFERRING DIAG: [phone] (ICD-10-CM) - Lumbar adjacent segment disease with spondylolisthesis  Rationale for Evaluation and Treatment: Rehabilitation  THERAPY DIAG:  Other low back pain  Difficulty in walking, not elsewhere classified  Abnormal posture  Muscle weakness (generalized)  ONSET DATE: worsening over last year, chronic  SUBJECTIVE:  SUBJECTIVE STATEMENT: Not a good day, did ok with the DN, did not notice any difference though.  Brought walking stick today, too difficult to pull rollator in and out of car.   PERTINENT HISTORY:  S/p prior L4/5 L5-S1 decompression and fusion surgery, R hip replacement and revision to congenital hip dysplasia, smoker, chronic LBP  PAIN:  Are you having pain? Yes: NPRS scale: 7/10 Pain location: low back radiating down both legs, feels it most in 1st and 2nd toes.  Pain description: sharp in back, pain in feet Aggravating factors: 9-10 at worst, everything aggravates the back, laying down to sleep makes the foot pain worst Relieving factors: oxycodone  PRECAUTIONS: Fall  WEIGHT BEARING RESTRICTIONS: No  FALLS:  Has patient fallen in last 6 months? Yes. Number of falls 1 due to hip, stepped wrong while carrying garbage  LIVING ENVIRONMENT: Lives with: lives with their daughter Lives in: House/apartment Stairs: Yes: External: 1 steps; post to use to step up Has following equipment at home: Dan Humphreys - 4 wheeled  OCCUPATION: retired  PLOF: Independent  PATIENT GOALS: try to put off surgery if possible  NEXT MD VISIT: 03/23/2023  OBJECTIVE:    DIAGNOSTIC FINDINGS:  Per MD notes MRI demonstrates chronic severe lumbar disc degeneration with degenerative scoliosis at L1-2 L2-3 and L3-4.  He appears to undergone auto fusion at L1-2 and L2-3.  He has progressive disc degeneration with some foraminal stenosis and moderate lateral recess stenosis bilaterally at L3-4.    PATIENT SURVEYS:  Modified Oswestry 29/50   SCREENING FOR RED FLAGS: Bowel or bladder incontinence: No Spinal tumors: No Cauda equina syndrome: No Compression fracture: No Abdominal aneurysm: No  COGNITION: Overall cognitive status: Within functional limits for tasks assessed     SENSATION: WFL Reports tingling/numbness in heel and 1st/2nd toes bil  MUSCLE LENGTH: Hamstrings: significant tightness in R hamstring, moderate left  POSTURE: decreased lumbar lordosis and flexed trunk   PALPATION: Tenderness throughout lumbar paraspinals.   LUMBAR ROM:   AROM eval  Flexion To knees  Extension Maintains 30 deg flexion in standing, unable to correct to neutral due to pain  Right lateral flexion Limited 75%  Left lateral flexion Limited 75%  Right rotation Limited 95%   Left rotation Limited 95%   (Blank rows = not tested)  LOWER EXTREMITY ROM:   R hip ROM limited due to prosthesis, hip flexion limited to 90 deg, limited hip IR and ER  LOWER EXTREMITY MMT:    MMT Right eval Left eval  Hip flexion 4+ 4+  Hip extension 4 4+  Hip abduction 4 4  Hip adduction 4 4  Knee flexion 4 4+  Knee extension 4+ 5  Ankle dorsiflexion 5 (NWB) 5 NWB  Ankle plantarflexion     (Blank rows = not tested)  FUNCTIONAL TESTS:  30 seconds chair stand test 3 times, with UE assist and table at 22 inches.   GAIT: Distance walked: 64' Assistive device utilized: None Level of assistance: SBA Comments: arrives wearing back brace, forward flexed posture, frequent touches to wall and furniture for support, reports using 4WRW for community navigation and furniture surfing  at home.  Unsteady and visually slow gait speed.   TODAY'S TREATMENT:  DATE:  02/22/2023 Therapeutic Exercise: to improve strength and mobility.  Demo, verbal and tactile cues throughout for technique. Nustep L3 x 5 min  Supine Diaphragmatic breathing with TrA contraction 10 x 3 sec hold PPT x 10  Small LTR x 10  Supine marches x 10  Supine hip adduction ball squeeze 10 x 5 sec hold Supine heel slide x 10 each  Manual Therapy: to decrease muscle spasm and pain and improve mobility STM/TPR to lumbar paraspinals and QL   02/20/23 Nustep L3 x 3 min then decreased to L1 x 3 min Sit to stand from high mat table x 5 with intermittent UE assist LAQ x 10 ea Seated IR x 10 B Standing heel raises x 10 Standing wall extension 2x10 Manual Therapy: to decrease muscle spasm and pain and improve mobility Skilled palpation and monitoring of soft tissues during DN Trigger Point Dry-Needling  Treatment instructions: Expect mild to moderate muscle soreness. S/S of pneumothorax if dry needled over a lung field, and to seek immediate medical attention should they occur. Patient verbalized understanding of these instructions and education. Patient Consent Given: Yes Education handout provided: Yes Muscles treated: R gluteals and piriformis Electrical stimulation performed: No Parameters: N/A Treatment response/outcome: Twitch Response Elicited and Palpable Increase in Muscle Length STM to R gluteals and piriformis   02/15/23  Eval Self Care:  See patient education   PATIENT EDUCATION:  Education details: initial HEP and DN rational, procedure, outcomes, potential side effects, and recommended post-treatment exercises/activity  Person educated: Patient Education method: Explanation, Demonstration, and Handouts Education comprehension: verbalized understanding and returned  demonstration  HOME EXERCISE PROGRAM: Access Code: [email or web address] URL: [email or web address] Date: 02/22/2023 Prepared by: Harrie Foreman  Exercises - Standing Lumbar Extension at Wall - Forearms  - 4-5 x daily - 7 x weekly - 2 sets - 10 reps - Standing Heel Raise with Support  - 1 x daily - 3-4 x weekly - 3 sets - 10 reps - Seated Long Arc Quad  - 1 x daily - 7 x weekly - 3 sets - 10 reps - 5 sec hold - Sit to Stand with Counter Support  - 1 x daily - 7 x weekly - 2 sets - 5-10 reps - Supine Diaphragmatic Breathing  - 1 x daily - 7 x weekly - 3 sets - 10 reps - Supine Posterior Pelvic Tilt  - 1 x daily - 7 x weekly - 3 sets - 10 reps - Supine Lower Trunk Rotation  - 1 x daily - 7 x weekly - 3 sets - 10 reps - Supine March  - 1 x daily - 7 x weekly - 3 sets - 10 reps - Supine Hip Adduction Isometric with Ball  - 1 x daily - 7 x weekly - 3 sets - 10 reps - Supine Heel Slide  - 1 x daily - 7 x weekly - 3 sets - 10 reps  ASSESSMENT:  CLINICAL IMPRESSION: Progressed exercises today focusing on neutral spine, as very painful today.  He tolerated neutral spine in very limited ROM, still noted frequent spasms from pain even then.  Updated HEP.  STM to low back at end of session to decrease muscle spasm.  Also provided information on inexpensive TENs unit.  CRAY MONNIN continues to demonstrate potential for improvement and would benefit from continued skilled therapy to address impairments.     OBJECTIVE IMPAIRMENTS: Abnormal gait, decreased activity tolerance, decreased mobility, difficulty walking, decreased ROM, decreased strength, hypomobility, increased fascial  restrictions, increased muscle spasms, impaired flexibility, postural dysfunction, and pain.   ACTIVITY LIMITATIONS: carrying, lifting, bending, standing, sleeping, transfers, and locomotion level  PARTICIPATION LIMITATIONS: meal prep, cleaning, laundry, and community activity  PERSONAL FACTORS: Past/current  experiences, Time since onset of injury/illness/exacerbation, and 1-2 comorbidities: S/p prior L4/5 L5-S1 decompression and fusion surgery, R hip replacement and revision to congenital hip dysplasia, smoker, chronic LBP  are also affecting patient's functional outcome.   REHAB POTENTIAL: Good  CLINICAL DECISION MAKING: Evolving/moderate complexity  EVALUATION COMPLEXITY: Moderate   GOALS: Goals reviewed with patient? Yes  SHORT TERM GOALS: Target date: 03/01/2023   Patient will be independent with initial HEP.  Baseline:  Goal status: IN PROGRESS   LONG TERM GOALS: Target date: 03/22/2023    Patient will be independent with advanced/ongoing HEP to improve outcomes and carryover.  Baseline:  Goal status: IN PROGRESS  2.  Patient will report 50% improvement in low back pain to improve QOL.  Baseline:  Goal status: IN PROGRESS  3. Patient will report 50% decrease in leg pain at night to improve sleep.    Baseline:  Goal status: IN PROGRESS  4.  Patient will demonstrate improved functional strength as demonstrated by completing 5 sit to stands in 30 seconds. Baseline: 3 reps with UE assist, table height 22 inches Goal status: IN PROGRESS  5.  Patient will report 6 points improvement on Modified Oswestry to demonstrate improved functional ability.  Baseline: 29/50 Goal status: IN PROGRESS   6.  Patient will tolerate 30 min of walking with AD to exercise for health.  Baseline: painful, can only do 1 lap Goal status: IN PROGRESS  7.  Patient to demonstrate ability to achieve and maintain good spinal alignment/posturing and  needed for daily activities. Baseline: forward flexed posture ~ 30 deg.  Goal status: IN PROGRESS  PLAN:  PT FREQUENCY: 1-2x/week  PT DURATION: 4 weeks  PLANNED INTERVENTIONS: Therapeutic exercises, Therapeutic activity, Neuromuscular re-education, Balance training, Gait training, Patient/Family education, Self Care, Joint mobilization, Dry Needling,  Electrical stimulation, Spinal mobilization, Cryotherapy, Moist heat, Taping, Traction, Manual therapy, and Re-evaluation.  PLAN FOR NEXT SESSION: Assess DN and continue as indicated, continue gentle neutral spine exercises, dry needling  - caution has L4-5, L5-S1 fusion.  Jena Gauss, PT  02/22/2023, 9:00 AM   [facility] [address] [place], [place] [address] [phone]  Fax: [phone]   PHYSICAL THERAPY DISCHARGE SUMMARY  Visits from Start of Care: 3  Current functional level related to goals / functional outcomes: No improvement    Remaining deficits: Continued severe LBP   Education / Equipment: HEP  Plan: Patient agrees to discharge.  Patient goals were not met. Patient is being discharged due to needing surgery.  He called on 03/06/23 to cancel all visits.  He missed last visit after fall and returned to Dr. Jordan Likes.      Jena Gauss, PT, DPT  03/06/2023 5:08 PM

## 2023-02-27 ENCOUNTER — Ambulatory Visit: Payer: Medicare HMO | Admitting: Physical Therapy

## 2023-03-01 ENCOUNTER — Encounter: Payer: Medicare HMO | Admitting: Physical Therapy

## 2023-03-02 DIAGNOSIS — M4316 Spondylolisthesis, lumbar region: Secondary | ICD-10-CM | POA: Diagnosis not present

## 2023-03-02 DIAGNOSIS — M5136 Other intervertebral disc degeneration, lumbar region: Secondary | ICD-10-CM | POA: Diagnosis not present

## 2023-03-07 ENCOUNTER — Ambulatory Visit: Payer: Medicare HMO

## 2023-03-09 ENCOUNTER — Encounter: Payer: Medicare HMO | Admitting: Physical Therapy

## 2023-03-14 ENCOUNTER — Encounter: Payer: Medicare HMO | Admitting: Physical Therapy

## 2023-03-16 ENCOUNTER — Encounter: Payer: Medicare HMO | Admitting: Physical Therapy

## 2023-03-22 ENCOUNTER — Encounter: Payer: Medicare HMO | Admitting: Physical Therapy

## 2023-04-06 DIAGNOSIS — L400 Psoriasis vulgaris: Secondary | ICD-10-CM | POA: Diagnosis not present

## 2023-04-13 DIAGNOSIS — M5136 Other intervertebral disc degeneration, lumbar region: Secondary | ICD-10-CM | POA: Diagnosis not present

## 2023-04-28 ENCOUNTER — Other Ambulatory Visit (HOSPITAL_BASED_OUTPATIENT_CLINIC_OR_DEPARTMENT_OTHER): Payer: Self-pay

## 2023-04-28 MED ORDER — COVID-19 MRNA VAC-TRIS(PFIZER) 30 MCG/0.3ML IM SUSY
0.3000 mL | PREFILLED_SYRINGE | Freq: Once | INTRAMUSCULAR | 0 refills | Status: AC
  Filled 2023-04-28: qty 0.3, 1d supply, fill #0

## 2023-04-28 MED ORDER — INFLUENZA VAC A&B SURF ANT ADJ 0.5 ML IM SUSY
0.5000 mL | PREFILLED_SYRINGE | Freq: Once | INTRAMUSCULAR | 0 refills | Status: AC
  Filled 2023-04-28: qty 0.5, 1d supply, fill #0

## 2023-05-04 ENCOUNTER — Other Ambulatory Visit: Payer: Self-pay | Admitting: Neurosurgery

## 2023-05-08 ENCOUNTER — Encounter (HOSPITAL_COMMUNITY): Payer: Self-pay | Admitting: Neurosurgery

## 2023-05-08 ENCOUNTER — Other Ambulatory Visit: Payer: Self-pay

## 2023-05-08 NOTE — Pre-Procedure Instructions (Signed)
PCP - Macario Carls, NP  Chest x-ray - 09/22/22 EKG - 09/22/22  Anesthesia review: N  Patient verbally denies any shortness of breath, fever, cough and chest pain during phone call   -------------  SDW INSTRUCTIONS given:  Your procedure is scheduled on Tuesday, October 1st.  Report to [facility] Main Entrance "A" at 0820 A.M., and check in at the Admitting office.  Call this number if you have problems the morning of surgery:  [phone]   Remember:  Do not eat or drink after midnight the night before your surgery    Take these medicines the morning of surgery with A SIP OF WATER  fluticasone (FLONASE)  Eye drops Oxycodone-if needed  As of today, STOP taking any Aspirin (unless otherwise instructed by your surgeon) Aleve, Naproxen, Ibuprofen, Motrin, Advil, Goody's, BC's, all herbal medications, fish oil, and all vitamins.                      Do not wear jewelry, make up, or nail polish            Do not wear lotions, powders, perfumes/colognes, or deodorant.            Do not shave 48 hours prior to surgery.  Men may shave face and neck.            Do not bring valuables to the hospital.            [facility] is not responsible for any belongings or valuables.  Do NOT Smoke (Tobacco/Vaping) 24 hours prior to your procedure If you use a CPAP at night, you may bring all equipment for your overnight stay.   Contacts, glasses, dentures or bridgework may not be worn into surgery.      For patients admitted to the hospital, discharge time will be determined by your treatment team.   Patients discharged the day of surgery will not be allowed to drive home, and someone needs to stay with them for 24 hours.    Special instructions:   [place]- Preparing For Surgery  Before surgery, you can play an important role. Because skin is not sterile, your skin needs to be as free of germs as possible. You can reduce the number of germs on your skin by washing with CHG  (chlorahexidine gluconate) Soap before surgery.  CHG is an antiseptic cleaner which kills germs and bonds with the skin to continue killing germs even after washing.    Oral Hygiene is also important to reduce your risk of infection.  Remember - BRUSH YOUR TEETH THE MORNING OF SURGERY WITH YOUR REGULAR TOOTHPASTE  Please do not use if you have an allergy to CHG or antibacterial soaps. If your skin becomes reddened/irritated stop using the CHG.  Do not shave (including legs and underarms) for at least 48 hours prior to first CHG shower. It is OK to shave your face.  Please follow these instructions carefully.   Shower the NIGHT BEFORE SURGERY and the MORNING OF SURGERY with DIAL Soap.   Pat yourself dry with a CLEAN TOWEL.  Wear CLEAN PAJAMAS to bed the night before surgery  Place CLEAN SHEETS on your bed the night of your first shower and DO NOT SLEEP WITH PETS.   Day of Surgery: Please shower morning of surgery  Wear Clean/Comfortable clothing the morning of surgery Do not apply any deodorants/lotions.   Remember to brush your teeth WITH YOUR REGULAR TOOTHPASTE.   Questions were  answered. Patient verbalized understanding of instructions.      '

## 2023-05-09 ENCOUNTER — Inpatient Hospital Stay (HOSPITAL_COMMUNITY)
Admission: RE | Admit: 2023-05-09 | Discharge: 2023-05-10 | DRG: 458 | Disposition: A | Discharge status: Home / Self Care | Payer: Medicare HMO | Source: Ambulatory Visit | Attending: Neurosurgery | Admitting: Neurosurgery

## 2023-05-09 ENCOUNTER — Encounter (HOSPITAL_COMMUNITY): Payer: Self-pay | Admitting: Neurosurgery

## 2023-05-09 ENCOUNTER — Inpatient Hospital Stay (HOSPITAL_COMMUNITY): Payer: Medicare HMO

## 2023-05-09 ENCOUNTER — Inpatient Hospital Stay (HOSPITAL_COMMUNITY): Payer: Medicare HMO | Admitting: Anesthesiology

## 2023-05-09 ENCOUNTER — Other Ambulatory Visit: Payer: Self-pay

## 2023-05-09 ENCOUNTER — Inpatient Hospital Stay (HOSPITAL_COMMUNITY)
Admission: RE | Disposition: A | Discharge status: Home / Self Care | Payer: Self-pay | Source: Ambulatory Visit | Attending: Neurosurgery

## 2023-05-09 DIAGNOSIS — D696 Thrombocytopenia, unspecified: Secondary | ICD-10-CM | POA: Diagnosis present

## 2023-05-09 DIAGNOSIS — M51369 Other intervertebral disc degeneration, lumbar region without mention of lumbar back pain or lower extremity pain: Secondary | ICD-10-CM | POA: Diagnosis present

## 2023-05-09 DIAGNOSIS — F17210 Nicotine dependence, cigarettes, uncomplicated: Secondary | ICD-10-CM | POA: Diagnosis present

## 2023-05-09 DIAGNOSIS — M48061 Spinal stenosis, lumbar region without neurogenic claudication: Secondary | ICD-10-CM | POA: Diagnosis present

## 2023-05-09 DIAGNOSIS — H409 Unspecified glaucoma: Secondary | ICD-10-CM | POA: Diagnosis not present

## 2023-05-09 DIAGNOSIS — M4156 Other secondary scoliosis, lumbar region: Secondary | ICD-10-CM | POA: Diagnosis present

## 2023-05-09 DIAGNOSIS — M4316 Spondylolisthesis, lumbar region: Secondary | ICD-10-CM | POA: Diagnosis present

## 2023-05-09 DIAGNOSIS — Z9889 Other specified postprocedural states: Secondary | ICD-10-CM | POA: Diagnosis not present

## 2023-05-09 DIAGNOSIS — Z800 Family history of malignant neoplasm of digestive organs: Secondary | ICD-10-CM | POA: Diagnosis not present

## 2023-05-09 DIAGNOSIS — Z811 Family history of alcohol abuse and dependence: Secondary | ICD-10-CM

## 2023-05-09 DIAGNOSIS — Z818 Family history of other mental and behavioral disorders: Secondary | ICD-10-CM

## 2023-05-09 DIAGNOSIS — Z8349 Family history of other endocrine, nutritional and metabolic diseases: Secondary | ICD-10-CM | POA: Diagnosis not present

## 2023-05-09 DIAGNOSIS — Z96641 Presence of right artificial hip joint: Secondary | ICD-10-CM | POA: Diagnosis present

## 2023-05-09 DIAGNOSIS — M5116 Intervertebral disc disorders with radiculopathy, lumbar region: Principal | ICD-10-CM | POA: Diagnosis present

## 2023-05-09 DIAGNOSIS — Z981 Arthrodesis status: Secondary | ICD-10-CM | POA: Diagnosis not present

## 2023-05-09 DIAGNOSIS — Z8619 Personal history of other infectious and parasitic diseases: Secondary | ICD-10-CM

## 2023-05-09 DIAGNOSIS — Z83719 Family history of colon polyps, unspecified: Secondary | ICD-10-CM

## 2023-05-09 DIAGNOSIS — Z836 Family history of other diseases of the respiratory system: Secondary | ICD-10-CM | POA: Diagnosis not present

## 2023-05-09 HISTORY — PX: ANTERIOR LAT LUMBAR FUSION: SHX1168

## 2023-05-09 HISTORY — DX: Spondylolisthesis, lumbar region: M43.16

## 2023-05-09 LAB — CBC
HCT: 47.1 % (ref 39.0–52.0)
Hemoglobin: 15.6 g/dL (ref 13.0–17.0)
MCH: 34.9 pg — ABNORMAL HIGH (ref 26.0–34.0)
MCHC: 33.1 g/dL (ref 30.0–36.0)
MCV: 105.4 fL — ABNORMAL HIGH (ref 80.0–100.0)
Platelets: 142 10*3/uL — ABNORMAL LOW (ref 150–400)
RBC: 4.47 MIL/uL (ref 4.22–5.81)
RDW: 12.4 % (ref 11.5–15.5)
WBC: 7.1 10*3/uL (ref 4.0–10.5)
nRBC: 0 % (ref 0.0–0.2)

## 2023-05-09 LAB — TYPE AND SCREEN
ABO/RH(D): B POS
Antibody Screen: NEGATIVE

## 2023-05-09 LAB — SURGICAL PCR SCREEN
MRSA, PCR: NEGATIVE
Staphylococcus aureus: NEGATIVE

## 2023-05-09 SURGERY — ANTERIOR LATERAL LUMBAR FUSION 1 LEVEL
Anesthesia: General | Laterality: Right

## 2023-05-09 MED ORDER — CHLORHEXIDINE GLUCONATE CLOTH 2 % EX PADS: 6.0000 | MEDICATED_PAD | Freq: Once | CUTANEOUS | Status: DC

## 2023-05-09 MED ORDER — PHENYLEPHRINE HCL (PRESSORS) 10 MG/ML IV SOLN
INTRAVENOUS | Status: DC | PRN
Start: 2023-05-09 — End: 2023-05-09
  Administered 2023-05-09: 80 ug via INTRAVENOUS
  Administered 2023-05-09: 160 ug via INTRAVENOUS
  Administered 2023-05-09: 80 ug via INTRAVENOUS

## 2023-05-09 MED ORDER — FENTANYL CITRATE (PF) 250 MCG/5ML IJ SOLN
INTRAMUSCULAR | Status: AC
  Filled 2023-05-09: qty 5

## 2023-05-09 MED ORDER — PROPOFOL 10 MG/ML IV BOLUS
INTRAVENOUS | Status: DC | PRN
  Administered 2023-05-09: 150 mg via INTRAVENOUS

## 2023-05-09 MED ORDER — FENTANYL CITRATE (PF) 250 MCG/5ML IJ SOLN
INTRAMUSCULAR | Status: DC | PRN
  Administered 2023-05-09: 50 ug via INTRAVENOUS
  Administered 2023-05-09 (×2): 100 ug via INTRAVENOUS

## 2023-05-09 MED ORDER — ONDANSETRON HCL 4 MG/2ML IJ SOLN
INTRAMUSCULAR | Status: DC | PRN
  Administered 2023-05-09: 4 mg via INTRAVENOUS

## 2023-05-09 MED ORDER — CEFAZOLIN SODIUM-DEXTROSE 2-4 GM/100ML-% IV SOLN
2.0000 g | INTRAVENOUS | Status: AC
  Administered 2023-05-09: 2 g via INTRAVENOUS
  Filled 2023-05-09: qty 100

## 2023-05-09 MED ORDER — LIDOCAINE 2% (20 MG/ML) 5 ML SYRINGE
INTRAMUSCULAR | Status: DC | PRN
  Administered 2023-05-09: 100 mg via INTRAVENOUS

## 2023-05-09 MED ORDER — OXYCODONE HCL 5 MG PO TABS
10.0000 mg | ORAL_TABLET | ORAL | Status: DC | PRN
  Administered 2023-05-09 – 2023-05-10 (×5): 10 mg via ORAL
  Filled 2023-05-09 (×5): qty 2

## 2023-05-09 MED ORDER — EPHEDRINE SULFATE-NACL 50-0.9 MG/10ML-% IV SOSY
PREFILLED_SYRINGE | INTRAVENOUS | Status: DC | PRN
  Administered 2023-05-09: 10 mg via INTRAVENOUS
  Administered 2023-05-09: 5 mg via INTRAVENOUS

## 2023-05-09 MED ORDER — CALCIUM CARBONATE ANTACID 750 MG PO CHEW: 1.0000 | CHEWABLE_TABLET | Freq: Every day | ORAL | Status: DC | PRN

## 2023-05-09 MED ORDER — FLUTICASONE PROPIONATE 50 MCG/ACT NA SUSP
1.0000 | Freq: Every day | NASAL | Status: DC
  Filled 2023-05-09: qty 16

## 2023-05-09 MED ORDER — OXYCODONE HCL 5 MG PO TABS
5.0000 mg | ORAL_TABLET | Freq: Once | ORAL | Status: AC | PRN
  Administered 2023-05-09: 5 mg via ORAL

## 2023-05-09 MED ORDER — LACTATED RINGERS IV SOLN: INTRAVENOUS | Status: DC

## 2023-05-09 MED ORDER — ACETAMINOPHEN 325 MG PO TABS
650.0000 mg | ORAL_TABLET | ORAL | Status: DC | PRN
  Filled 2023-05-09: qty 2

## 2023-05-09 MED ORDER — ONDANSETRON HCL 4 MG PO TABS: 4.0000 mg | ORAL_TABLET | Freq: Four times a day (QID) | ORAL | Status: DC | PRN

## 2023-05-09 MED ORDER — DEXAMETHASONE SODIUM PHOSPHATE 10 MG/ML IJ SOLN
INTRAMUSCULAR | Status: DC | PRN
  Administered 2023-05-09: 10 mg via INTRAVENOUS

## 2023-05-09 MED ORDER — BUPIVACAINE HCL (PF) 0.25 % IJ SOLN
INTRAMUSCULAR | Status: DC | PRN
  Administered 2023-05-09: 30 mL

## 2023-05-09 MED ORDER — ONDANSETRON HCL 4 MG/2ML IJ SOLN: 4.0000 mg | Freq: Four times a day (QID) | INTRAMUSCULAR | Status: DC | PRN

## 2023-05-09 MED ORDER — ONDANSETRON HCL 4 MG/2ML IJ SOLN
INTRAMUSCULAR | Status: AC
  Filled 2023-05-09: qty 2

## 2023-05-09 MED ORDER — TIMOLOL MALEATE 0.5 % OP SOLN
1.0000 [drp] | Freq: Two times a day (BID) | OPHTHALMIC | Status: DC
  Administered 2023-05-09 – 2023-05-10 (×2): 1 [drp] via OPHTHALMIC
  Filled 2023-05-09: qty 5

## 2023-05-09 MED ORDER — MAGNESIUM OXIDE -MG SUPPLEMENT 400 (240 MG) MG PO TABS
400.0000 mg | ORAL_TABLET | Freq: Every day | ORAL | Status: DC
  Administered 2023-05-09 – 2023-05-10 (×2): 400 mg via ORAL
  Filled 2023-05-09 (×2): qty 1

## 2023-05-09 MED ORDER — ESMOLOL HCL 100 MG/10ML IV SOLN
INTRAVENOUS | Status: DC | PRN
  Administered 2023-05-09: 30 mg via INTRAVENOUS
  Administered 2023-05-09: 20 mg via INTRAVENOUS

## 2023-05-09 MED ORDER — THROMBIN 5000 UNITS EX SOLR
CUTANEOUS | Status: AC
  Filled 2023-05-09: qty 10000

## 2023-05-09 MED ORDER — SODIUM CHLORIDE 0.9% FLUSH: 3.0000 mL | Freq: Two times a day (BID) | INTRAVENOUS | Status: DC

## 2023-05-09 MED ORDER — HYDROMORPHONE HCL 1 MG/ML IJ SOLN: 1.0000 mg | INTRAMUSCULAR | Status: DC | PRN

## 2023-05-09 MED ORDER — CHLORHEXIDINE GLUCONATE 0.12 % MT SOLN
15.0000 mL | Freq: Once | OROMUCOSAL | Status: AC
  Administered 2023-05-09: 15 mL via OROMUCOSAL
  Filled 2023-05-09: qty 15

## 2023-05-09 MED ORDER — VITAMIN C 500 MG PO TABS
500.0000 mg | ORAL_TABLET | Freq: Every day | ORAL | Status: DC
  Administered 2023-05-09 – 2023-05-10 (×2): 500 mg via ORAL
  Filled 2023-05-09 (×2): qty 1

## 2023-05-09 MED ORDER — FENTANYL CITRATE (PF) 100 MCG/2ML IJ SOLN
25.0000 ug | INTRAMUSCULAR | Status: DC | PRN
  Administered 2023-05-09: 100 ug via INTRAVENOUS

## 2023-05-09 MED ORDER — ADULT MULTIVITAMIN W/MINERALS CH
1.0000 | ORAL_TABLET | Freq: Every day | ORAL | Status: DC
  Administered 2023-05-09 – 2023-05-10 (×2): 1 via ORAL
  Filled 2023-05-09 (×2): qty 1

## 2023-05-09 MED ORDER — HYDROCODONE-ACETAMINOPHEN 10-325 MG PO TABS: 1.0000 | ORAL_TABLET | ORAL | Status: DC | PRN

## 2023-05-09 MED ORDER — OXYCODONE HCL 5 MG PO TABS
ORAL_TABLET | ORAL | Status: AC
  Filled 2023-05-09: qty 1

## 2023-05-09 MED ORDER — METOPROLOL TARTRATE 5 MG/5ML IV SOLN
INTRAVENOUS | Status: DC | PRN
Start: 2023-05-09 — End: 2023-05-09
  Administered 2023-05-09 (×2): 5 mg via INTRAVENOUS

## 2023-05-09 MED ORDER — ONDANSETRON HCL 4 MG/2ML IJ SOLN: 4.0000 mg | Freq: Once | INTRAMUSCULAR | Status: DC | PRN

## 2023-05-09 MED ORDER — CALCIUM CARBONATE ANTACID 500 MG PO CHEW: 1.0000 | CHEWABLE_TABLET | Freq: Every day | ORAL | Status: DC | PRN

## 2023-05-09 MED ORDER — BUPIVACAINE HCL (PF) 0.25 % IJ SOLN
INTRAMUSCULAR | Status: AC
  Filled 2023-05-09: qty 30

## 2023-05-09 MED ORDER — ACETAMINOPHEN 650 MG RE SUPP: 650.0000 mg | RECTAL | Status: DC | PRN

## 2023-05-09 MED ORDER — SODIUM CHLORIDE 0.9 % IV SOLN: 250.0000 mL | INTRAVENOUS | Status: DC

## 2023-05-09 MED ORDER — FENTANYL CITRATE (PF) 100 MCG/2ML IJ SOLN
INTRAMUSCULAR | Status: AC
  Filled 2023-05-09: qty 4

## 2023-05-09 MED ORDER — SUCCINYLCHOLINE CHLORIDE 200 MG/10ML IV SOSY
PREFILLED_SYRINGE | INTRAVENOUS | Status: DC | PRN
  Administered 2023-05-09: 100 mg via INTRAVENOUS

## 2023-05-09 MED ORDER — BISACODYL 10 MG RE SUPP: 10.0000 mg | Freq: Every day | RECTAL | Status: DC | PRN

## 2023-05-09 MED ORDER — THROMBIN (RECOMBINANT) 5000 UNITS EX SOLR
CUTANEOUS | Status: DC | PRN
  Administered 2023-05-09: 10 mL via TOPICAL

## 2023-05-09 MED ORDER — ORAL CARE MOUTH RINSE: 15.0000 mL | Freq: Once | OROMUCOSAL | Status: AC

## 2023-05-09 MED ORDER — SUCCINYLCHOLINE CHLORIDE 200 MG/10ML IV SOSY
PREFILLED_SYRINGE | INTRAVENOUS | Status: AC
  Filled 2023-05-09: qty 10

## 2023-05-09 MED ORDER — MENTHOL 3 MG MT LOZG: 1.0000 | LOZENGE | OROMUCOSAL | Status: DC | PRN

## 2023-05-09 MED ORDER — DEXAMETHASONE SODIUM PHOSPHATE 10 MG/ML IJ SOLN
INTRAMUSCULAR | Status: AC
  Filled 2023-05-09: qty 1

## 2023-05-09 MED ORDER — LATANOPROST 0.005 % OP SOLN
1.0000 [drp] | Freq: Every day | OPHTHALMIC | Status: DC
  Filled 2023-05-09: qty 2.5

## 2023-05-09 MED ORDER — OXYCODONE HCL 5 MG/5ML PO SOLN: 5.0000 mg | Freq: Once | ORAL | Status: AC | PRN

## 2023-05-09 MED ORDER — MAGNESIUM GLYCINATE 100 MG PO CAPS: 400.0000 mg | ORAL_CAPSULE | Freq: Every day | ORAL | Status: DC

## 2023-05-09 MED ORDER — PROPOFOL 500 MG/50ML IV EMUL
INTRAVENOUS | Status: DC | PRN
Start: 2023-05-09 — End: 2023-05-09
  Administered 2023-05-09: 70 ug/kg/min via INTRAVENOUS

## 2023-05-09 MED ORDER — SODIUM CHLORIDE 0.9% FLUSH: 3.0000 mL | INTRAVENOUS | Status: DC | PRN

## 2023-05-09 MED ORDER — PHENOL 1.4 % MT LIQD: 1.0000 | OROMUCOSAL | Status: DC | PRN

## 2023-05-09 MED ORDER — 0.9 % SODIUM CHLORIDE (POUR BTL) OPTIME
TOPICAL | Status: DC | PRN
  Administered 2023-05-09 (×2): 1000 mL

## 2023-05-09 MED ORDER — POLYETHYLENE GLYCOL 3350 17 G PO PACK: 17.0000 g | PACK | Freq: Every day | ORAL | Status: DC | PRN

## 2023-05-09 MED ORDER — B COMPLEX VITAMINS PO CAPS: 1.0000 | ORAL_CAPSULE | Freq: Every day | ORAL | Status: DC

## 2023-05-09 MED ORDER — PHENYLEPHRINE HCL-NACL 20-0.9 MG/250ML-% IV SOLN
INTRAVENOUS | Status: DC | PRN
Start: 2023-05-09 — End: 2023-05-09
  Administered 2023-05-09: 30 ug/min via INTRAVENOUS

## 2023-05-09 MED ORDER — VITAMIN B-12 100 MCG PO TABS
100.0000 ug | ORAL_TABLET | Freq: Every day | ORAL | Status: DC
  Administered 2023-05-10: 100 ug via ORAL
  Filled 2023-05-09 (×2): qty 1

## 2023-05-09 MED ORDER — ACETAMINOPHEN 10 MG/ML IV SOLN: 1000.0000 mg | Freq: Once | INTRAVENOUS | Status: DC | PRN

## 2023-05-09 MED ORDER — LIDOCAINE 2% (20 MG/ML) 5 ML SYRINGE
INTRAMUSCULAR | Status: AC
  Filled 2023-05-09: qty 5

## 2023-05-09 MED ORDER — DIAZEPAM 5 MG PO TABS
5.0000 mg | ORAL_TABLET | Freq: Four times a day (QID) | ORAL | Status: DC | PRN
  Administered 2023-05-09: 5 mg via ORAL
  Filled 2023-05-09: qty 1

## 2023-05-09 MED ORDER — CEFAZOLIN SODIUM-DEXTROSE 1-4 GM/50ML-% IV SOLN
1.0000 g | Freq: Three times a day (TID) | INTRAVENOUS | Status: AC
  Administered 2023-05-09 – 2023-05-10 (×2): 1 g via INTRAVENOUS
  Filled 2023-05-09 (×2): qty 50

## 2023-05-09 MED ORDER — PROPOFOL 10 MG/ML IV BOLUS
INTRAVENOUS | Status: AC
  Filled 2023-05-09: qty 20

## 2023-05-09 MED ORDER — PHENYLEPHRINE HCL-NACL 20-0.9 MG/250ML-% IV SOLN: INTRAVENOUS | Status: DC | PRN

## 2023-05-09 MED ORDER — FLEET ENEMA RE ENEM: 1.0000 | ENEMA | Freq: Once | RECTAL | Status: DC | PRN

## 2023-05-09 SURGICAL SUPPLY — 52 items
ADH SKN CLS APL DERMABOND .7 (GAUZE/BANDAGES/DRESSINGS) ×2
APL SKNCLS STERI-STRIP NONHPOA (GAUZE/BANDAGES/DRESSINGS) ×1
BAG COUNTER SPONGE SURGICOUNT (BAG) ×2 IMPLANT
BAG DECANTER FOR FLEXI CONT (MISCELLANEOUS) ×2 IMPLANT
BAG SPNG CNTER NS LX DISP (BAG) ×1
BENZOIN TINCTURE PRP APPL 2/3 (GAUZE/BANDAGES/DRESSINGS) ×2 IMPLANT
BLADE CLIPPER SURG (BLADE) IMPLANT
BOLT PLATE XLIF 5.5X55 LRG (Bolt) IMPLANT
BONE MATRIX OSTEOCEL PRO MED (Bone Implant) IMPLANT
DERMABOND ADVANCED .7 DNX12 (GAUZE/BANDAGES/DRESSINGS) ×4 IMPLANT
DRAPE C-ARM 42X72 X-RAY (DRAPES) ×2 IMPLANT
DRAPE C-ARMOR (DRAPES) ×2 IMPLANT
DRAPE LAPAROTOMY 100X72X124 (DRAPES) ×2 IMPLANT
DRAPE SURG 17X23 STRL (DRAPES) ×4 IMPLANT
DRSG OPSITE POSTOP 4X6 (GAUZE/BANDAGES/DRESSINGS) IMPLANT
ELECT BLADE 6.5 EXT (BLADE) IMPLANT
ELECT REM PT RETURN 9FT ADLT (ELECTROSURGICAL) ×1
ELECTRODE REM PT RTRN 9FT ADLT (ELECTROSURGICAL) ×2 IMPLANT
FEE INTRAOP MONITOR IMPULS NCS (MISCELLANEOUS) IMPLANT
GAUZE 4X4 16PLY ~~LOC~~+RFID DBL (SPONGE) IMPLANT
GLOVE BIOGEL PI IND STRL 7.5 (GLOVE) IMPLANT
GLOVE ECLIPSE 9.0 STRL (GLOVE) ×2 IMPLANT
GLOVE EXAM NITRILE XL STR (GLOVE) IMPLANT
GLOVE SURG SS PI 7.0 STRL IVOR (GLOVE) IMPLANT
GOWN STRL REUS W/ TWL LRG LVL3 (GOWN DISPOSABLE) IMPLANT
GOWN STRL REUS W/ TWL XL LVL3 (GOWN DISPOSABLE) ×4 IMPLANT
GOWN STRL REUS W/TWL 2XL LVL3 (GOWN DISPOSABLE) IMPLANT
GOWN STRL REUS W/TWL LRG LVL3 (GOWN DISPOSABLE) ×1
GOWN STRL REUS W/TWL XL LVL3 (GOWN DISPOSABLE) ×2
INTRAOP MONITOR FEE IMPULS NCS (MISCELLANEOUS)
KIT BASIN OR (CUSTOM PROCEDURE TRAY) ×2 IMPLANT
KIT DILATOR XLIF 5 (KITS) IMPLANT
KIT SURGICAL ACCESS MAXCESS 4 (KITS) IMPLANT
KIT TURNOVER KIT B (KITS) ×2 IMPLANT
MODULE NVM5 NEXT GEN EMG (NEUROSURGERY SUPPLIES) IMPLANT
MODULUS XLW 10X22X50MM 10DEG (Spine Construct) IMPLANT
NDL HYPO 22X1.5 SAFETY MO (MISCELLANEOUS) ×2 IMPLANT
NEEDLE HYPO 22X1.5 SAFETY MO (MISCELLANEOUS) ×1 IMPLANT
NS IRRIG 1000ML POUR BTL (IV SOLUTION) ×2 IMPLANT
PACK LAMINECTOMY NEURO (CUSTOM PROCEDURE TRAY) ×2 IMPLANT
PLATE 2H 10MM (Plate) IMPLANT
SCREW DECADE 5.5X50 (Screw) IMPLANT
SPONGE SURGIFOAM ABS GEL SZ50 (HEMOSTASIS) IMPLANT
SPONGE T-LAP 4X18 ~~LOC~~+RFID (SPONGE) IMPLANT
STRIP CLOSURE SKIN 1/2X4 (GAUZE/BANDAGES/DRESSINGS) ×2 IMPLANT
SUT VIC AB 2-0 CT1 18 (SUTURE) ×4 IMPLANT
SUT VIC AB 3-0 SH 8-18 (SUTURE) ×4 IMPLANT
TAPE CLOTH 3X10 TAN LF (GAUZE/BANDAGES/DRESSINGS) ×2 IMPLANT
TOWEL GREEN STERILE (TOWEL DISPOSABLE) ×2 IMPLANT
TOWEL GREEN STERILE FF (TOWEL DISPOSABLE) ×2 IMPLANT
TRAY FOLEY MTR SLVR 16FR STAT (SET/KITS/TRAYS/PACK) ×2 IMPLANT
WATER STERILE IRR 1000ML POUR (IV SOLUTION) ×2 IMPLANT

## 2023-05-09 NOTE — Transfer of Care (Addendum)
Immediate Anesthesia Transfer of Care Note  Patient: Brett [name]  Procedure(s) Performed: Extreme Lateral Interbody Fusion Lumbar three-four -right (Right)  Patient Location: PACU  Anesthesia Type:General  Level of Consciousness: awake and alert   Airway & Oxygen Therapy: Patient Spontanous Breathing and Patient connected to nasal cannula oxygen  Post-op Assessment: Report given to RN and Post -op Vital signs reviewed and stable  Post vital signs: Reviewed  Last Vitals:  Vitals Value Taken Time  BP 140/93 05/09/23 1315  Temp    Pulse 82 05/09/23 1318  Resp 26 05/09/23 1318  SpO2 87 % 05/09/23 1318  Vitals shown include unfiled device data.  Last Pain:  Vitals:   05/09/23 0900  TempSrc: Oral  PainSc: 5       Patients Stated Pain Goal: 0 (05/09/23 0900)  Complications: No notable events documented.

## 2023-05-09 NOTE — H&P (Signed)
Brett [name] is an 67 y.o. male.   Chief Complaint: Back pain HPI: 67 year old male remotely status post L4-5 and L5-S1 decompression and fusion surgery presents with worsening mechanical back pain and intermittent radicular symptoms failing conservative management workup demonstrates evidence of progressive disc degeneration with progressive lateral listhesis at L3-4.  Situation complicated by degenerative scoliotic deformity above that which is fixed with what appears to be autofusion at L1-2 and L2-3 laterally on the right side.  The patient has failed conservative management presents now for L3-4 decompression and fusion in hopes improving his symptoms.  Past Medical History:  Diagnosis Date   Allergy    Arthritis    Blood transfusion without reported diagnosis    Chicken pox    Congenital hip deformity    Glaucoma    Neuromuscular disorder (HCC) 11/2019   sciatica    Past Surgical History:  Procedure Laterality Date   COLONOSCOPY  06/17/2015   Pyrtle   COLONOSCOPY  07/20/2020   Pyrtle   HAMMER TOE SURGERY  07/28/2011   Procedure: HAMMER TOE CORRECTION;  Surgeon: Loreta Ave, MD;  Location: [facility];  Service: Orthopedics;  Laterality: Right;  right 2nd and 4th toes correction hammer toe, capsulotomy metatarsal-phalangeal joints   HERNIA REPAIR     HIP SURGERY  1986 & 2010   rt total hip-8/10-multiple rt hip surgeries- had 4 prior to 1986 as a child   JOINT REPLACEMENT  2010   lumber fusion  09/2020   POLYPECTOMY     SPINE SURGERY  09/14/2020   THUMB ARTHROSCOPY  2008   rt    Family History  Problem Relation Age of Onset   Cancer Mother 53       history of colon cancer   Rosacea Mother    Colon cancer Mother 67   Colon polyps Mother    Rosacea Father    Lung disease Father        ?pulmonary fibrosis   Alcohol abuse Brother    Depression Brother    Colon cancer Maternal Grandmother    Endocrine tumor Daughter        pituitary tumor, POTTS    Thyroid disease Son        ?hyperthyroid   Cancer Cousin        colon   Colon cancer Cousin    Esophageal cancer Neg Hx    Rectal cancer Neg Hx    Stomach cancer Neg Hx    Social History:  reports that he has been smoking cigarettes. He started smoking about 49 years ago. He has a 24.9 pack-year smoking history. He has never used smokeless tobacco. He reports current alcohol use of about 10.0 standard drinks of alcohol per week. He reports that he does not use drugs.  Allergies: No Known Allergies  Medications Prior to Admission  Medication Sig Dispense Refill   ascorbic acid (VITAMIN C) 500 MG tablet Take 500 mg by mouth daily.     b complex vitamins capsule Take 1 capsule by mouth daily.     calcium carbonate (TUMS EX) 750 MG chewable tablet Chew 1-2 tablets by mouth daily as needed for heartburn.     fluticasone (FLONASE) 50 MCG/ACT nasal spray Place 1 spray into both nostrils daily.     MAGNESIUM GLYCINATE PO Take 400 mg by mouth daily.     Multiple Vitamin (MULTIVITAMIN) tablet Take 1 tablet by mouth daily. Centrum silver     Oxycodone HCl 10 MG TABS  Take 10 mg by mouth every 6 (six) hours as needed (Pain).     timolol (TIMOPTIC) 0.5 % ophthalmic solution Place 1 drop into both eyes 2 (two) times daily.     TRAVATAN Z 0.004 % SOLN ophthalmic solution Place 1 drop into both eyes every morning.  1   sildenafil (REVATIO) 20 MG tablet Take 1-2 tablets by mouth 30 minutes prior to sexual activity. 30 tablet 1    Results for orders placed or performed during the hospital encounter of 05/09/23 (from the past 48 hour(s))  Type and screen     Status: None (Preliminary result)   Collection Time: 05/09/23  9:01 AM  Result Value Ref Range   ABO/RH(D) B POS    Antibody Screen NEG    Sample Expiration      05/12/2023,2359 Performed at [facility], [address] [place], [place] [address]   CBC per protocol     Status: Abnormal   Collection Time: 05/09/23  9:13 AM  Result  Value Ref Range   WBC 7.1 4.0 - 10.5 K/uL   RBC 4.47 4.22 - 5.81 MIL/uL   Hemoglobin 15.6 13.0 - 17.0 g/dL   HCT 29.5 62.1 - 30.8 %   MCV 105.4 (H) 80.0 - 100.0 fL   MCH 34.9 (H) 26.0 - 34.0 pg   MCHC 33.1 30.0 - 36.0 g/dL   RDW 65.7 84.6 - 96.2 %   Platelets 142 (L) 150 - 400 K/uL   nRBC 0.0 0.0 - 0.2 %    Comment: Performed at [facility], [address] [place], [place] [address]   No results found.  Pertinent items noted in HPI and remainder of comprehensive ROS otherwise negative.  Blood pressure (!) 152/85, pulse 62, temperature 97.6 F (36.4 C), temperature source Oral, resp. rate 16, height 6\' 2"  (1.88 m), weight 70.3 kg, SpO2 98%.  Patient is awake and alert.  He is oriented and appropriate.  Speech is fluent.  Judgment insight are intact.  Cranial nerve function normal bilaterally motor examination reveals intact motor strength bilaterally sensory examination with no major sensory deficits.  Reflexes are hypoactive but symmetric.  No with long track signs.  Gait antalgic.  Posture moderately flexed peer examination head ears eyes nose and throat is unremarkable chest and abdomen are benign.  Extremities are free of major deformity. Assessment/Plan L3-4 adjacent level degeneration with degenerative lateral listhesis and foraminal stenosis.  Plan right sided L3-4 anterior lateral retroperitoneal interbody decompression and fusion utilizing interbody cages, local harvested autograft, and lateral plate instrumentation.  Risks and benefits been explained.  Patient wishes to proceed.  (We initially discussed doing the procedure from a left-sided approach but reviewing his anatomy the surgery will be better performed on the right side.  Sherilyn Cooter A Tayvia Faughnan 05/09/2023, 10:13 AM

## 2023-05-09 NOTE — Anesthesia Postprocedure Evaluation (Signed)
Anesthesia Post Note  Patient: Brett [name]  Procedure(s) Performed: Extreme Lateral Interbody Fusion Lumbar three-four -right (Right)     Patient location during evaluation: PACU Anesthesia Type: General Level of consciousness: awake and alert Pain management: pain level controlled Vital Signs Assessment: post-procedure vital signs reviewed and stable Respiratory status: spontaneous breathing Cardiovascular status: blood pressure returned to baseline Postop Assessment: no headache Anesthetic complications: no   There were no known notable events for this encounter.  Last Vitals:  Vitals:   05/09/23 1415 05/09/23 1430  BP: (!) 155/84 (!) 162/84  Pulse: 62 78  Resp: 18 19  Temp:    SpO2: 94% 94%    Last Pain:  Vitals:   05/09/23 1345  TempSrc:   PainSc: 4                  Mariann Barter

## 2023-05-09 NOTE — Op Note (Signed)
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  Asst. Surgeon: Doran Durand, NP  Anesthesia: General  Indication: 67 year old male status post prior L4-5 and L5-S1 decompression and fusion presents with worsening back pain and lower extremity symptoms failing conservative management workup demonstrates evidence of severe adjacent segment disease with lateral listhesis at L3-4.  Patient presents now for L3-4 decompression and fusion surgery  Operative note: After induction of anesthesia, patient positioned in the left lateral cubitus position and appropriately padded.  Patient's right flank was prepped and draped sterilely.  Incision made overlying the L3-4 disc space and a secondary incision made in the right posterior flank.  Using the posterior flank incision blunt dissection was then made into the retroperitoneal space.  The peritoneal sac and contents were mobilized anteriorly.  A dilator was then passed through the lateral incision docking into the right L3-4 disc space.  Neuromonitoring was performed to ensure safe passage of the dilator and ensure there were no adjacent neural structures.  A K wire was then impacted into the disc space at L3-4.  The dilators were sequentially enlarged again stimulating directly and utilizing neuromonitoring to confirm safe passage.  Self-retaining retractor was then passed over the dilators.  This was also stimulated and found to be in safe position and not in contact with the lumbar plexus.  The retractor was opened slightly.  The dilators  were removed.  The lateral disc space was inspected and directly stimulated again ensuring no overlying neural structures.  A shim was then passed into the L3-4 disc space.  The retractor was widened exposing the disc space and the vertebral bodies of L3 and L4.  A small amount of psoas muscle was divided and the disc space was then incised with a 15 blade.  Discectomy was then performed using various instruments and curettes.  Contralateral release was then performed by impacting Cobb elevator through the contralateral annulus.  The disc space was then progressively distracted.  A 10 mm distractor fit very snugly and provided good reduction of the patient's deformity.  A 10 mm x 22 mm x 50 mm NuVasive titanium printed cage was then packed with Osteocel plus.  The disc base was further cleaned and the endplates were curetted.  The cage was then impacted in place and confirmed to be in good position both the AP and lateral plane.  A 10 mm lateral NuVasive plate was then placed over the L3 and L4 vertebral bodies and disc space.  This is then attached under fluoroscopic guidance using 55 mm screw anchors.  The screw anchors were passed in a bicortical fashion.  Final tightening was performed and then the locking mechanisms were engaged in both screws.  The lateral plate was tightened and locked into place.  The retractor was removed.  Final images reveal good position of the cage and the hardware at the proper operative level with normal alignment of the spine.  Wound was irrigated.  Wounds were then closed in layers with Vicryl sutures.  Steri-Strips and sterile dressing were applied.  No apparent complications.  Patient tolerated the procedure well and he returns to the recovery room postop.

## 2023-05-09 NOTE — Brief Op Note (Signed)
05/09/2023  12:44 PM  PATIENT:  Brett [name]  67 y.o. male  PRE-OPERATIVE DIAGNOSIS:  LUMBAR ADJACENT SEGMENT DISEASE WITH SPONDYLOLISTHESIS  POST-OPERATIVE DIAGNOSIS:  LUMBAR ADJACENT SEGMENT DISEASE WITH SPONDYLOLISTHESIS  PROCEDURE:  Procedure(s) with comments: Extreme Lateral Interbody Fusion Lumbar three-four -right (Right) - 3C  SURGEON:  Surgeons and Role:    Julio Sicks, MD - Primary  PHYSICIAN ASSISTANT:   ASSISTANTSMarland Mcalpine   ANESTHESIA:   general  EBL:  Minimal   BLOOD ADMINISTERED:none  DRAINS: none   LOCAL MEDICATIONS USED:  MARCAINE     SPECIMEN:  No Specimen  DISPOSITION OF SPECIMEN:  N/A  COUNTS:  YES  TOURNIQUET:  * No tourniquets in log *  DICTATION: .Dragon Dictation  PLAN OF CARE: Admit to inpatient   PATIENT DISPOSITION:  PACU - hemodynamically stable.   Delay start of Pharmacological VTE agent (>24hrs) due to surgical blood loss or risk of bleeding: yes

## 2023-05-09 NOTE — Anesthesia Preprocedure Evaluation (Signed)
Anesthesia Evaluation  Patient identified by MRN, date of birth, ID band Patient awake    Reviewed: Allergy & Precautions, NPO status , Patient's Chart, lab work & pertinent test results, reviewed documented beta blocker date and time   History of Anesthesia Complications Negative for: history of anesthetic complications  Airway Mallampati: III  TM Distance: >3 FB Neck ROM: Full    Dental no notable dental hx.    Pulmonary neg shortness of breath, neg sleep apnea, neg COPD, Current Smoker, neg PE   breath sounds clear to auscultation       Cardiovascular (-) hypertension(-) angina (-) CAD, (-) Past MI, (-) Cardiac Stents, (-) CABG, (-) Peripheral Vascular Disease and (-) Orthopnea (-) dysrhythmias (-) pacemaker Rhythm:Regular     Neuro/Psych neg Seizures  Neuromuscular disease    GI/Hepatic ,neg GERD  ,,(+) neg Cirrhosis        Endo/Other  neg diabetes    Renal/GU Renal disease     Musculoskeletal  (+) Arthritis ,    Abdominal   Peds  Hematology  (+) Blood dyscrasia (mild thrombocytopenia)   Anesthesia Other Findings   Reproductive/Obstetrics                              Anesthesia Physical Anesthesia Plan  ASA: 2  Anesthesia Plan: General   Post-op Pain Management:    Induction: Intravenous  PONV Risk Score and Plan: 1 and Ondansetron and Dexamethasone  Airway Management Planned: Oral ETT  Additional Equipment:   Intra-op Plan:   Post-operative Plan: Extubation in OR  Informed Consent: I have reviewed the patients History and Physical, chart, labs and discussed the procedure including the risks, benefits and alternatives for the proposed anesthesia with the patient or authorized representative who has indicated his/her understanding and acceptance.     Dental advisory given  Plan Discussed with:   Anesthesia Plan Comments:          Anesthesia Quick  Evaluation

## 2023-05-10 ENCOUNTER — Encounter (HOSPITAL_COMMUNITY): Payer: Self-pay | Admitting: Neurosurgery

## 2023-05-10 MED ORDER — TIZANIDINE HCL 2 MG PO TABS: 2.0000 mg | ORAL_TABLET | Freq: Four times a day (QID) | ORAL | 1 refills | Status: DC | PRN

## 2023-05-10 MED ORDER — OXYCODONE HCL 10 MG PO TABS: 10.0000 mg | ORAL_TABLET | Freq: Four times a day (QID) | ORAL | 0 refills | Status: DC | PRN

## 2023-05-10 MED FILL — Thrombin For Soln 5000 Unit: CUTANEOUS | Qty: 2 | Status: AC

## 2023-05-10 NOTE — Discharge Instructions (Signed)

## 2023-05-10 NOTE — Evaluation (Signed)
Physical Therapy Evaluation  Patient Details Name: Brett [name] MRN: [id number] DOB: Mar 06, 1956 Today's Date: 05/10/2023  History of Present Illness  Pt is a 67 y/o male who presents s/p L3-L4 ALIF on 05/09/23. PMH significant for arthritis, congenital hip deformity s/p THA, glaucoma.  Clinical Impression  Pt admitted with above diagnosis. At the time of PT eval, pt was able to demonstrate transfers and ambulation with gross CGA to supervision for safety and RW for support. Pt with grossly flexed trunk throughout mobility and with difficulty maintaining corrective changes. Pt was educated on precautions, brace application/wearing schedule, appropriate activity progression, and car transfer. Pt currently with functional limitations due to the deficits listed below (see PT Problem List). Pt will benefit from skilled PT to increase their independence and safety with mobility to allow discharge to the venue listed below.          If plan is discharge home, recommend the following: A little help with walking and/or transfers;A little help with bathing/dressing/bathroom;Assistance with cooking/housework;Assist for transportation;Help with stairs or ramp for entrance   Can travel by private vehicle        Equipment Recommendations Rolling walker (2 wheels)  Recommendations for Other Services       Functional Status Assessment Patient has had a recent decline in their functional status and demonstrates the ability to make significant improvements in function in a reasonable and predictable amount of time.     Precautions / Restrictions Precautions Precautions: Fall;Back Precaution Booklet Issued: Yes (comment) Required Braces or Orthoses: Spinal Brace Spinal Brace: Lumbar corset Restrictions Weight Bearing Restrictions: No      Mobility  Bed Mobility Overal bed mobility: Needs Assistance Bed Mobility: Rolling, Sidelying to Sit Rolling: Supervision Sidelying to sit: Supervision        General bed mobility comments: Pt's bed is adjustable at home. Pt performing log roll with HOB slightly elevated and rails lowered to simulate home environment. Pt was cued for optimal technique.    Transfers Overall transfer level: Needs assistance Equipment used: Rolling walker (2 wheels) Transfers: Sit to/from Stand Sit to Stand: Supervision           General transfer comment: VC's for improved posture with sit<>stand and for hand placement on seated surface for safety.    Ambulation/Gait Ambulation/Gait assistance: Contact guard assist, Supervision Gait Distance (Feet): 300 Feet Assistive device: Rolling walker (2 wheels) Gait Pattern/deviations: Step-through pattern, Decreased stride length, Trunk flexed Gait velocity: Decreased Gait velocity interpretation: 1.31 - 2.62 ft/sec, indicative of limited community ambulator   General Gait Details: VC's for improved posture, closer walker proximity and forward gaze. No overt LOB noted. Pt reports he will be using the rollator at home. Encouraged pt to adjust rollator high enough and hold rollator close enough to maintain back precautions.  Stairs            Wheelchair Mobility     Tilt Bed    Modified Rankin (Stroke Patients Only)       Balance Overall balance assessment: Needs assistance Sitting-balance support: Feet supported Sitting balance-Leahy Scale: Good     Standing balance support: Single extremity supported, During functional activity Standing balance-Leahy Scale: Fair Standing balance comment: needs at least 1 UE supported externally in standing                             Pertinent Vitals/Pain Pain Assessment Pain Assessment: Faces Faces Pain Scale: Hurts a little bit Pain  Location: back Pain Descriptors / Indicators: Operative site guarding, Sore Pain Intervention(s): Limited activity within patient's tolerance, Monitored during session, Repositioned    Home Living  Family/patient expects to be discharged to:: Private residence Living Arrangements: Children Available Help at Discharge: Family;Available 24 hours/day Type of Home: Other(Comment) (Townhome) Home Access: Stairs to enter   [employer] of Steps: 1   Home Layout: One level Home Equipment: Educational psychologist (4 wheels);Grab bars - tub/shower;Hand held shower head      Prior Function Prior Level of Function : Independent/Modified Independent             Mobility Comments: denies falls ADLs Comments: mod I, sponge bathes most days due to back pain. Drives     Extremity/Trunk Assessment   Upper Extremity Assessment Upper Extremity Assessment: Defer to OT evaluation    Lower Extremity Assessment Lower Extremity Assessment: Generalized weakness (Mild; consistent with pre-op diagnosis)    Cervical / Trunk Assessment Cervical / Trunk Assessment: Back Surgery  Communication   Communication Communication: No apparent difficulties  Cognition Arousal: Alert Behavior During Therapy: WFL for tasks assessed/performed Overall Cognitive Status: Within Functional Limits for tasks assessed                                          General Comments General comments (skin integrity, edema, etc.): VSS on RA    Exercises     Assessment/Plan    PT Assessment Patient needs continued PT services  PT Problem List Decreased strength;Decreased activity tolerance;Decreased balance;Decreased mobility;Decreased knowledge of use of DME;Decreased safety awareness;Decreased knowledge of precautions;Pain       PT Treatment Interventions DME instruction;Gait training;Functional mobility training;Therapeutic activities;Therapeutic exercise;Balance training;Cognitive remediation;Patient/family education    PT Goals (Current goals can be found in the Care Plan section)  Acute Rehab PT Goals Patient Stated Goal: home today PT Goal Formulation: With patient Time For  Goal Achievement: 05/17/23 Potential to Achieve Goals: Good    Frequency Min 5X/week     Co-evaluation               AM-PAC PT "6 Clicks" Mobility  Outcome Measure Help needed turning from your back to your side while in a flat bed without using bedrails?: A Little Help needed moving from lying on your back to sitting on the side of a flat bed without using bedrails?: A Little Help needed moving to and from a bed to a chair (including a wheelchair)?: A Little Help needed standing up from a chair using your arms (e.g., wheelchair or bedside chair)?: A Little Help needed to walk in hospital room?: A Little Help needed climbing 3-5 steps with a railing? : A Little 6 Click Score: 18    End of Session Equipment Utilized During Treatment: Gait belt;Back brace Activity Tolerance: Patient tolerated treatment well Patient left: in bed;with call bell/phone within reach Nurse Communication: Mobility status PT Visit Diagnosis: Unsteadiness on feet (R26.81);Pain Pain - part of body:  (back)    Time: 1610-9604 PT Time Calculation (min) (ACUTE ONLY): 17 min   Charges:   PT Evaluation $PT Eval Low Complexity: 1 Low   PT General Charges $$ ACUTE PT VISIT: 1 Visit         Conni Slipper, PT, DPT Acute Rehabilitation Services Secure Chat Preferred Office: [phone]   Marylynn Pearson 05/10/2023, 11:53 AM

## 2023-05-10 NOTE — Discharge Summary (Signed)
Physician Discharge Summary  Patient ID: Brett [name] MRN: [id number] DOB/AGE: 15-May-1956 67 y.o.  Admit date: 05/09/2023 Discharge date: 05/10/2023  Admission Diagnoses:  Discharge Diagnoses:  Principal Problem:   Lumbar adjacent segment disease with spondylolisthesis   Discharged Condition: good  Hospital Course: Patient admitted to the hospital where he underwent uncomplicated L3-4 decompression and fusion surgery postoperative doing very well.  Back and lower extremity symptoms much improved.  Standing ambulating and voiding without difficulty.  Ready for discharge home.  Consults:   Significant Diagnostic Studies:   Treatments:   Discharge Exam: Blood pressure (!) 116/56, pulse 82, temperature 98.6 F (37 C), resp. rate 16, height 6\' 2"  (1.88 m), weight 70.3 kg, SpO2 93%. Awake and alert.  Oriented and appropriate.  Motor and sensory function intact.  Wound clean and dry.  Chest and abdomen benign.  Disposition: Discharge disposition: 01-Home or Self Care        Allergies as of 05/10/2023   No Known Allergies      Medication List     TAKE these medications    ascorbic acid 500 MG tablet Commonly known as: VITAMIN C Take 500 mg by mouth daily.   b complex vitamins capsule Take 1 capsule by mouth daily.   calcium carbonate 750 MG chewable tablet Commonly known as: TUMS EX Chew 1-2 tablets by mouth daily as needed for heartburn.   fluticasone 50 MCG/ACT nasal spray Commonly known as: FLONASE Place 1 spray into both nostrils daily.   MAGNESIUM GLYCINATE PO Take 400 mg by mouth daily.   multivitamin tablet Take 1 tablet by mouth daily. Centrum silver   Oxycodone HCl 10 MG Tabs Take 1 tablet (10 mg total) by mouth every 6 (six) hours as needed (Pain).   sildenafil 20 MG tablet Commonly known as: REVATIO Take 1-2 tablets by mouth 30 minutes prior to sexual activity.   timolol 0.5 % ophthalmic solution Commonly known as: TIMOPTIC Place 1  drop into both eyes 2 (two) times daily.   tiZANidine 2 MG tablet Commonly known as: ZANAFLEX Take 1 tablet (2 mg total) by mouth every 6 (six) hours as needed for muscle spasms.   Travatan Z 0.004 % Soln ophthalmic solution Generic drug: Travoprost (BAK Free) Place 1 drop into both eyes every morning.               Durable Medical Equipment  (From admission, onward)           Start     Ordered   05/09/23 1459  DME Walker rolling  Once       Question:  Patient needs a walker to treat with the following condition  Answer:  Lumbar adjacent segment disease with spondylolisthesis   05/09/23 1458   05/09/23 1459  DME 3 n 1  Once        05/09/23 1458             Signed: Sherilyn Cooter A Shaine Newmark 05/10/2023, 8:12 AM

## 2023-05-10 NOTE — Plan of Care (Signed)
Pt doing well. Pt given D/C instructions with verbal understanding. Rx's were sent to the pharmacy by MD. Pt's incision is clean and dry with no sign of infection. Pt's IV was removed prior to D/C. Pt received RW from [employer] per order. Pt D/C'd home via wheelchair per MD order. Pt is stable @ D/C and has no other needs at this time. Rema Fendt, RN

## 2023-05-10 NOTE — Evaluation (Signed)
Occupational Therapy Evaluation Patient Details Name: Brett [name] MRN: [id number] DOB: 22-May-1956 Today's Date: 05/10/2023   History of Present Illness Brett [name] is a 67 yo male who underwent Extreme Lateral Interbody Fusion Lumbar three-four -right 10/1. PMHx: arthritis, congenital hip deformity, glaucoma   Clinical Impression   Brett [name] was evaluated s/p the above spine surgery. He is mod I at baseline and lives with his daughter who can assist at discharge. Upon evaluation pt was limited by mild pain, back precautions, baseline limited R hip limited mobility and decreased activity tolerance. Overall he needed min A for LB ADLs and up to CGA for transfers/mobility with RW. Provided cues and education on spinal precautions and compensatory techniques throughout, handout provided and pt demonstrated great recall during ADLs and mobility. Pt does not require further acute OT services, pt with active d/c place for home today. Recommend d/c home with support of family.         If plan is discharge home, recommend the following: A little help with walking and/or transfers;A little help with bathing/dressing/bathroom;Assistance with cooking/housework;Direct supervision/assist for medications management;Direct supervision/assist for financial management;Assist for transportation;Help with stairs or ramp for entrance    Functional Status Assessment  Patient has had a recent decline in their functional status and demonstrates the ability to make significant improvements in function in a reasonable and predictable amount of time.  Equipment Recommendations  Other (comment) (RW)       Precautions / Restrictions Precautions Precautions: Fall;Back Precaution Booklet Issued: Yes (comment) Required Braces or Orthoses: Spinal Brace Spinal Brace: Lumbar corset Restrictions Weight Bearing Restrictions: No      Mobility Bed Mobility Overal bed mobility: Needs Assistance Bed Mobility:  Rolling, Sidelying to Sit Rolling: Supervision Sidelying to sit: Supervision       General bed mobility comments: pt's bed is adjustable at home    Transfers Overall transfer level: Needs assistance Equipment used: Rolling walker (2 wheels) Transfers: Sit to/from Stand Sit to Stand: Contact guard assist                  Balance Overall balance assessment: Needs assistance Sitting-balance support: Feet supported Sitting balance-Leahy Scale: Good     Standing balance support: Single extremity supported, During functional activity Standing balance-Leahy Scale: Fair Standing balance comment: needs at least 1 UE supported externally in standing                           ADL either performed or assessed with clinical judgement   ADL Overall ADL's : Needs assistance/impaired Eating/Feeding: Independent   Grooming: Supervision/safety   Upper Body Bathing: Set up;Sitting   Lower Body Bathing: Minimal assistance;Sit to/from stand   Upper Body Dressing : Set up;Sitting   Lower Body Dressing: Minimal assistance   Toilet Transfer: Supervision/safety;Ambulation;Rolling walker (2 wheels)   Toileting- Clothing Manipulation and Hygiene: Supervision/safety;Sit to/from stand;Sitting/lateral lean       Functional mobility during ADLs: Supervision/safety;Rolling walker (2 wheels) General ADL Comments: cues for compensatory techniques and back precautions throughout; min A needed to thread clothing on RLE     Vision Baseline Vision/History: 1 Wears glasses Vision Assessment?: No apparent visual deficits     Perception Perception: Within Functional Limits       Praxis Praxis: WFL       Pertinent Vitals/Pain Pain Assessment Pain Assessment: 0-10 Pain Score: 2  Pain Location: back Pain Descriptors / Indicators: Discomfort Pain Intervention(s): Monitored during session  Extremity/Trunk Assessment Upper Extremity Assessment Upper Extremity  Assessment: Overall WFL for tasks assessed   Lower Extremity Assessment Lower Extremity Assessment: Defer to PT evaluation   Cervical / Trunk Assessment Cervical / Trunk Assessment: Back Surgery   Communication Communication Communication: No apparent difficulties   Cognition Arousal: Alert Behavior During Therapy: WFL for tasks assessed/performed Overall Cognitive Status: Within Functional Limits for tasks assessed             General Comments  VSS on RA            Home Living Family/patient expects to be discharged to:: Private residence Living Arrangements: Children Available Help at Discharge: Family;Available 24 hours/day Type of Home: Other(Comment) (Townhome) Home Access: Stairs to enter [employer] of Steps: 1   Home Layout: One level     Bathroom Shower/Tub: Producer, television/film/video: Handicapped height     Home Equipment: Educational psychologist (4 wheels);Grab bars - tub/shower;Hand held shower head          Prior Functioning/Environment Prior Level of Function : Independent/Modified Independent             Mobility Comments: denies falls ADLs Comments: mod I, sponge bathes most days due to back pain. Drives        OT Problem List: Decreased range of motion;Impaired balance (sitting and/or standing);Decreased activity tolerance;Decreased knowledge of use of DME or AE;Decreased safety awareness;Decreased knowledge of precautions         OT Goals(Current goals can be found in the care plan section) Acute Rehab OT Goals Patient Stated Goal: home OT Goal Formulation: With patient Time For Goal Achievement: 05/10/23 Potential to Achieve Goals: Good   AM-PAC OT "6 Clicks" Daily Activity     Outcome Measure Help from another person eating meals?: None Help from another person taking care of personal grooming?: A Little Help from another person toileting, which includes using toliet, bedpan, or urinal?: A Little Help from  another person bathing (including washing, rinsing, drying)?: A Little Help from another person to put on and taking off regular upper body clothing?: A Little Help from another person to put on and taking off regular lower body clothing?: A Little 6 Click Score: 19   End of Session Equipment Utilized During Treatment: Rolling walker (2 wheels);Back brace Nurse Communication: Mobility status  Activity Tolerance: Patient tolerated treatment well Patient left: in bed;with call bell/phone within reach  OT Visit Diagnosis: Unsteadiness on feet (R26.81);Other abnormalities of gait and mobility (R26.89);Muscle weakness (generalized) (M62.81)                Time: 1914-7829 OT Time Calculation (min): 24 min Charges:  OT General Charges $OT Visit: 1 Visit OT Evaluation $OT Eval Low Complexity: 1 Low  Derenda Mis, OTR/L Acute Rehabilitation Services Office [phone] Secure Chat Communication Preferred   Donia Pounds 05/10/2023, 10:11 AM

## 2023-05-12 ENCOUNTER — Telehealth: Payer: Self-pay | Admitting: Family

## 2023-05-12 NOTE — Telephone Encounter (Signed)
Please contact pt to schedule a post op follow up visit with me.

## 2023-05-23 ENCOUNTER — Ambulatory Visit: Payer: Medicare HMO | Admitting: Family

## 2023-05-23 ENCOUNTER — Ambulatory Visit (INDEPENDENT_AMBULATORY_CARE_PROVIDER_SITE_OTHER): Payer: Medicare HMO | Admitting: Family

## 2023-05-23 VITALS — BP 138/75 | HR 78 | Temp 97.8°F | Resp 18 | Ht 74.0 in | Wt 155.0 lb

## 2023-05-23 DIAGNOSIS — M51369 Other intervertebral disc degeneration, lumbar region without mention of lumbar back pain or lower extremity pain: Secondary | ICD-10-CM

## 2023-05-23 DIAGNOSIS — Z8679 Personal history of other diseases of the circulatory system: Secondary | ICD-10-CM | POA: Insufficient documentation

## 2023-05-23 DIAGNOSIS — M4316 Spondylolisthesis, lumbar region: Secondary | ICD-10-CM

## 2023-05-23 HISTORY — DX: Personal history of other diseases of the circulatory system: Z86.79

## 2023-05-23 NOTE — Patient Instructions (Signed)
VISIT SUMMARY:  During your recent visit, we discussed your recovery from the lumbar fusion surgery, your hip pain, and a newly noted cardiac arrhythmia. You reported feeling much better since the surgery with significantly reduced pain levels. You also mentioned having trouble with your hip, which you are managing by ensuring you stand up straight. We also noted a cardiac arrhythmia post-operatively, but you reported no symptoms related to this.  YOUR PLAN:  -POST-OPERATIVE LUMBAR FUSION: This refers to your recent surgery where the L3 and L4 discs in your lower back were fused together. You are to continue with your current pain management and physical therapy as directed by your surgeon.  -CARDIAC ARRHYTHMIA: This is a condition where your heart beats irregularly. You had an episode of a heart arrythmia briefly following your surgeon. We have referred you to a cardiologist for further evaluation and management.  -HIP PAIN: This is a chronic issue you've been experiencing. You've had a previous partial hip replacement. Due to the risk of pelvic fracture, no current surgical intervention is planned. You should continue with your current pain management and physical therapy as directed by your orthopedic surgeon.  INSTRUCTIONS:  Please make sure to schedule an appointment with a cardiologist for further evaluation of your cardiac arrhythmia. Continue with your current pain management and physical therapy as directed by your surgeon and orthopedic surgeon.

## 2023-05-23 NOTE — Progress Notes (Signed)
Subjective:     Patient ID: Brett [name], male    DOB: 1956-01-27, 67 y.o.   MRN: [id number]  Chief Complaint  Patient presents with   Follow-up    Had back back surgery 05/09/23.     HPI  Discussed the use of AI scribe software for clinical note transcription with the patient, who gave verbal consent to proceed.  History of Present Illness   The patient, with a history of lumbar spine fusion, presents for a post-operative follow-up. He reports that his lumbar spine is now fully fused, with L4, L5, and the sacrum surgically fused two years ago, and the remaining discs fused naturally over time. He recently underwent another surgery for the remaining L3 and L4 discs, which had disintegrated. The surgery was performed through the side, which was expected to result in a faster recovery time than if the back muscles had been cut. He reports feeling much better since the surgery, with significantly reduced pain levels. He also mentions having trouble with his hip, which frequently gives out. He has been told by multiple doctors that he lacks the bone structure for a complete hip replacement, and that such a procedure would likely shatter his pelvic bone. He is managing his hip issue by ensuring he stands up straight, as leaning forward exacerbates the problem. He is currently mobile and able to walk around his house.     He underwent L3-4 decompression and fusion surgery on 05/09/23 with Dr. Julio Sicks.  Dr. Roslynn Amble, Anesthesiologist reached out to me post-operatively to let me know that after intubation and with foley catheter placement the patient appeared to go into atrial flutter (or potentially some other type of SVT). They were able to break the dysrhythmia with esmolol and then metoprolol. His 12-lead ECG in PACU was normal. He felt that the arrhythmia may have been related to stimulation/pain (with the endotracheal tube/foley) but that he he probably would benefit from further cardiac  workup.   BP Readings from Last 3 Encounters:  05/23/23 138/75  05/10/23 (!) 116/56  09/22/22 119/73     There are no preventive care reminders to display for this patient.  Past Medical History:  Diagnosis Date   Allergy    Arthritis    Blood transfusion without reported diagnosis    Chicken pox    Congenital hip deformity    Glaucoma    Neuromuscular disorder (HCC) 11/2019   sciatica    Past Surgical History:  Procedure Laterality Date   ANTERIOR LAT LUMBAR FUSION Right 05/09/2023   Procedure: Extreme Lateral Interbody Fusion Lumbar three-four -right;  Surgeon: Julio Sicks, MD;  Location: [facility] OR;  Service: Neurosurgery;  Laterality: Right;  3C   COLONOSCOPY  06/17/2015   Pyrtle   COLONOSCOPY  07/20/2020   Pyrtle   HAMMER TOE SURGERY  07/28/2011   Procedure: HAMMER TOE CORRECTION;  Surgeon: Loreta Ave, MD;  Location:  SURGERY CENTER;  Service: Orthopedics;  Laterality: Right;  right 2nd and 4th toes correction hammer toe, capsulotomy metatarsal-phalangeal joints   HERNIA REPAIR     HIP SURGERY  1986 & 2010   rt total hip-8/10-multiple rt hip surgeries- had 4 prior to 1986 as a child   JOINT REPLACEMENT  2010   lumber fusion  09/2020   POLYPECTOMY     SPINE SURGERY  09/14/2020   THUMB ARTHROSCOPY  2008   rt    Family History  Problem Relation Age of Onset   Cancer  Mother 32       history of colon cancer   Rosacea Mother    Colon cancer Mother 11   Colon polyps Mother    Rosacea Father    Lung disease Father        ?pulmonary fibrosis   Alcohol abuse Brother    Depression Brother    Colon cancer Maternal Grandmother    Endocrine tumor Daughter        pituitary tumor, POTTS   Thyroid disease Son        ?hyperthyroid   Cancer Cousin        colon   Colon cancer Cousin    Esophageal cancer Neg Hx    Rectal cancer Neg Hx    Stomach cancer Neg Hx     Social History   Socioeconomic History   Marital status: Widowed    Spouse name: Not on  file   Number of children: Not on file   Years of education: Not on file   Highest education level: Associate degree: occupational, Scientist, product/process development, or vocational program  Occupational History   Not on file  Tobacco Use   Smoking status: Every Day    Current packs/day: 0.50    Average packs/day: 0.5 packs/day for 49.8 years (24.9 ttl pk-yrs)    Types: Cigarettes    Start date: 54   Smokeless tobacco: Never   Tobacco comments:    0.5 ppd  Vaping Use   Vaping status: Never Used  Substance and Sexual Activity   Alcohol use: Yes    Alcohol/week: 10.0 standard drinks of alcohol    Types: 10 Standard drinks or equivalent per week   Drug use: No   Sexual activity: Not Currently    Birth control/protection: None  Other Topics Concern   Not on file  Social History Narrative   Quality control Tech- gauges/callibrations.  (Retired)   Some colleg/tech school   wife passed   3 grown children (oldest daughter is living with them) youngest daughter lives in [place].  Curator at [employer].  Son lives near [place]- framing/art.   Social Determinants of Health   Financial Resource Strain: Low Risk  (05/23/2023)   Overall Financial Resource Strain (CARDIA)    Difficulty of Paying Living Expenses: Not very hard  Food Insecurity: No Food Insecurity (05/23/2023)   Hunger Vital Sign    Worried About Running Out of Food in the Last Year: Never true    Ran Out of Food in the Last Year: Never true  Recent Concern: Food Insecurity - Food Insecurity Present (05/16/2023)   Hunger Vital Sign    Worried About Running Out of Food in the Last Year: Never true    Ran Out of Food in the Last Year: Sometimes true  Transportation Needs: No Transportation Needs (05/23/2023)   PRAPARE - Administrator, Civil Service (Medical): No    Lack of Transportation (Non-Medical): No  Physical Activity: Inactive (05/23/2023)   Exercise Vital Sign    Days of Exercise per Week: 0 days    Minutes of  Exercise per Session: 10 min  Stress: No Stress Concern Present (05/23/2023)   [employer] of Occupational Health - Occupational Stress Questionnaire    Feeling of Stress : Only a little  Social Connections: Moderately Integrated (05/23/2023)   Social Connection and Isolation Panel [NHANES]    Frequency of Communication with Friends and Family: More than three times a week    Frequency of Social Gatherings with  Friends and Family: Once a week    Attends Religious Services: 1 to 4 times per year    Active Member of Clubs or Organizations: No    Attends Engineer, structural: More than 4 times per year    Marital Status: Widowed  Intimate Partner Violence: Not At Risk (02/16/2023)   Humiliation, Afraid, Rape, and Kick questionnaire    Fear of Current or Ex-Partner: No    Emotionally Abused: No    Physically Abused: No    Sexually Abused: No    Outpatient Medications Prior to Visit  Medication Sig Dispense Refill   ascorbic acid (VITAMIN C) 500 MG tablet Take 500 mg by mouth daily.     b complex vitamins capsule Take 1 capsule by mouth daily.     calcium carbonate (TUMS EX) 750 MG chewable tablet Chew 1-2 tablets by mouth daily as needed for heartburn.     fluticasone (FLONASE) 50 MCG/ACT nasal spray Place 1 spray into both nostrils daily.     MAGNESIUM GLYCINATE PO Take 400 mg by mouth daily.     Multiple Vitamin (MULTIVITAMIN) tablet Take 1 tablet by mouth daily. Centrum silver     Oxycodone HCl 10 MG TABS Take 1 tablet (10 mg total) by mouth every 6 (six) hours as needed (Pain). 30 tablet 0   STELARA 45 MG/0.5ML injection SMARTSIG:1 Syringe(s) SUB-Q Every 12 Weeks     timolol (TIMOPTIC) 0.5 % ophthalmic solution Place 1 drop into both eyes 2 (two) times daily.     tiZANidine (ZANAFLEX) 2 MG tablet Take 1 tablet (2 mg total) by mouth every 6 (six) hours as needed for muscle spasms. 30 tablet 1   TRAVATAN Z 0.004 % SOLN ophthalmic solution Place 1 drop into both eyes every  morning.  1   sildenafil (REVATIO) 20 MG tablet Take 1-2 tablets by mouth 30 minutes prior to sexual activity. 30 tablet 1   No facility-administered medications prior to visit.    No Known Allergies  ROS See HPI    Objective:    Physical Exam Constitutional:      General: He is not in acute distress.    Appearance: He is well-developed.  HENT:     Head: Normocephalic and atraumatic.  Cardiovascular:     Rate and Rhythm: Normal rate and regular rhythm.     Heart sounds: No murmur heard. Pulmonary:     Effort: Pulmonary effort is normal. No respiratory distress.     Breath sounds: Normal breath sounds. No wheezing or rales.  Skin:    General: Skin is warm and dry.     Comments: 2 right posterior/lateral incisions noted.  Clean dry and intact with some surrounding ecchymosis.   Neurological:     Mental Status: He is alert and oriented to person, place, and time.  Psychiatric:        Behavior: Behavior normal.        Thought Content: Thought content normal.      BP 138/75   Pulse 78   Temp 97.8 F (36.6 C) (Oral)   Resp 18   Ht 6\' 2"  (1.88 m)   Wt 155 lb (70.3 kg)   SpO2 97%   BMI 19.90 kg/m  Wt Readings from Last 3 Encounters:  05/23/23 155 lb (70.3 kg)  05/09/23 155 lb (70.3 kg)  09/22/22 152 lb 1.9 oz (69 kg)       Assessment & Plan:   Problem List Items Addressed This Visit  Unprioritized   Lumbar adjacent segment disease with spondylolisthesis    S/p fusion  L3-4 on 10/1.  He is doing well post operatively and notes a significant improvement in his pain. Management per neurosurgery.      History of cardiac dysrhythmia - Primary    Will refer to cardiology for further evaluation.  Will likely need Zio Monitor- will defer to cardioloyg.       Relevant Orders   Ambulatory referral to Cardiology   30 minutes spent on today's visit. Time was spent reviewing hospital records, interviewing and examining patient.  I have discontinued Rockey Guarino.  Weatherholtz's sildenafil. I am also having him maintain his Travatan Z, multivitamin, b complex vitamins, ascorbic acid, timolol, fluticasone, MAGNESIUM GLYCINATE PO, calcium carbonate, Oxycodone HCl, tiZANidine, and Stelara.  No orders of the defined types were placed in this encounter.

## 2023-05-23 NOTE — Assessment & Plan Note (Signed)
Will refer to cardiology for further evaluation.  Will likely need Zio Monitor- will defer to cardioloyg.

## 2023-05-23 NOTE — Assessment & Plan Note (Signed)
S/p fusion  L3-4 on 10/1.  He is doing well post operatively and notes a significant improvement in his pain. Management per neurosurgery.

## 2023-06-08 DIAGNOSIS — M5416 Radiculopathy, lumbar region: Secondary | ICD-10-CM | POA: Diagnosis not present

## 2023-06-16 ENCOUNTER — Other Ambulatory Visit: Payer: Self-pay

## 2023-06-16 DIAGNOSIS — M48061 Spinal stenosis, lumbar region without neurogenic claudication: Secondary | ICD-10-CM

## 2023-06-16 DIAGNOSIS — Q659 Congenital deformity of hip, unspecified: Secondary | ICD-10-CM | POA: Insufficient documentation

## 2023-06-16 DIAGNOSIS — B019 Varicella without complication: Secondary | ICD-10-CM | POA: Insufficient documentation

## 2023-06-16 DIAGNOSIS — Z5189 Encounter for other specified aftercare: Secondary | ICD-10-CM | POA: Insufficient documentation

## 2023-06-16 DIAGNOSIS — M1990 Unspecified osteoarthritis, unspecified site: Secondary | ICD-10-CM | POA: Insufficient documentation

## 2023-06-16 DIAGNOSIS — T7840XA Allergy, unspecified, initial encounter: Secondary | ICD-10-CM | POA: Insufficient documentation

## 2023-06-16 HISTORY — DX: Spinal stenosis, lumbar region without neurogenic claudication: M48.061

## 2023-06-19 ENCOUNTER — Ambulatory Visit: Payer: Medicare HMO

## 2023-06-19 VITALS — BP 134/72 | HR 64 | Ht 74.0 in | Wt 152.0 lb

## 2023-06-19 DIAGNOSIS — I471 Supraventricular tachycardia, unspecified: Secondary | ICD-10-CM

## 2023-06-19 HISTORY — DX: Supraventricular tachycardia, unspecified: I47.10

## 2023-06-19 NOTE — Progress Notes (Signed)
Cardiology Consultation:    Date:  06/19/2023   ID:  MORDCHA BURDETT, DOB May 04, 1956, MRN [id number]  PCP:  Sandford Craze, NP  Cardiologist:  Luretha Murphy, MD   Referring MD: Sandford Craze, NP   No chief complaint on file.    ASSESSMENT AND PLAN:   Brett [name] 67 year old male patient with no significant prior cardiac history, has chronic back pain and prior lumbar spinal fusion and recently underwent fusion of L3-L4 on May 09, 2023, intraoperatively anesthetist noted short paroxysmal SVT episodes requiring esmolol which terminated the rhythm and restored sinus rhythm was referred for further evaluation with cardiology.  He remains asymptomatic and in sinus rhythm at this time during office visit.  Problem List Items Addressed This Visit     Paroxysmal SVT (supraventricular tachycardia) (HCC) - Primary    Short brief episode during spinal fusion surgery under general anesthesia, terminated with esmolol. Unfortunately no rhythm strips available. Remains in sinus rhythm at this time. Remains asymptomatic.  Will obtain transthoracic echocardiogram to rule out any structural and functional issues. Will obtain Zio patch for 14 days to assess for any underlying asymptomatic cardiac arrhythmias.  Advised to cut back on alcohol consumption. Advised to quit smoking.       Relevant Orders   EKG 12-Lead (Completed)   LONG TERM MONITOR (3-14 DAYS)   ECHOCARDIOGRAM COMPLETE   Follow-up in the clinic based on test results.   History of Present Illness:    Brett [name] is a 67 y.o. male who is being seen today for the evaluation of brief paroxysmal SVT episodes identified during his lumbar spine surgery on May 09, 2023.  At the request of Sandford Craze, NP.  Has no significant prior cardiac history. Has had chronic back pain related to lumbar spine issues with prior lumbar spine fusion surgery now recently underwent L3-L4 fusion on October  1.  Here for the visit by himself.  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  Denies any ongoing symptoms.  Denies any palpitations, lightheadedness, dizziness, syncopal episodes.  Balance is good, undergoing therapy with improved functional status.  Recently had surgery for lumbar spine decompression and fusion on 05-09-2023. There was concern about atrial flutter/SVT intraoperatively after sedation and/intubation and Foley catheter placement per anesthetist and was terminated with esmolol.  No further recurrence. He reports no symptoms.  EKG in the clinic today shows sinus rhythm heart rate 64/min.  PR interval 116 ms, QTc 404 ms.  Drinks 1 to 2 glasses of wine a day. Smokes half to a pack of cigarettes a day. No recreational drug use.   Past Medical History:  Diagnosis Date   Allergy    Arthritis    Bilateral low back pain without sciatica 09/22/2015   Blood transfusion without reported diagnosis    Cervical neck pain with evidence of disc disease 09/22/2015   Chicken pox    Congenital hip deformity    Corn of foot 08/09/2022   Degenerative disc disease, cervical 01/29/2015   Eczema 05/30/2013   Erectile dysfunction 09/22/2015   Glaucoma 05/30/2013   History of cardiac dysrhythmia 05/23/2023   Isthmic spondylolisthesis 05/05/2020   Lumbar adjacent segment disease with spondylolisthesis 05/09/2023   Need for shingles vaccine 09/22/2015   Neuromuscular disorder (HCC) 11/2019   sciatica   Pain of left hip joint 11/01/2018   Personal history of congenital hip dysplasia 05/30/2013   Preventative health care 01/29/2015   Rhinitis, allergic 09/22/2015  Rosacea 05/30/2013   Scoliosis deformity of spine 05/05/2020   Spinal stenosis of lumbar region 06/16/2023   Spondylolisthesis at L5-S1 level 09/14/2020   Tobacco abuse 05/30/2013    Past Surgical History:  Procedure Laterality Date   ANTERIOR LAT LUMBAR FUSION Right 05/09/2023    Procedure: Extreme Lateral Interbody Fusion Lumbar three-four -right;  Surgeon: Julio Sicks, MD;  Location: [facility] OR;  Service: Neurosurgery;  Laterality: Right;  3C   COLONOSCOPY  06/17/2015   Pyrtle   COLONOSCOPY  07/20/2020   Pyrtle   HAMMER TOE SURGERY  07/28/2011   Procedure: HAMMER TOE CORRECTION;  Surgeon: Loreta Ave, MD;  Location: [facility];  Service: Orthopedics;  Laterality: Right;  right 2nd and 4th toes correction hammer toe, capsulotomy metatarsal-phalangeal joints   HERNIA REPAIR     HIP SURGERY  1986 & 2010   rt total hip-8/10-multiple rt hip surgeries- had 4 prior to 1986 as a child   JOINT REPLACEMENT  2010   lumber fusion  09/2020   POLYPECTOMY     SPINE SURGERY  09/14/2020   THUMB ARTHROSCOPY  2008   rt    Current Medications: Current Meds  Medication Sig   ascorbic acid (VITAMIN C) 500 MG tablet Take 500 mg by mouth daily.   b complex vitamins capsule Take 1 capsule by mouth daily.   calcium carbonate (TUMS EX) 750 MG chewable tablet Chew 1-2 tablets by mouth daily as needed for heartburn.   fluticasone (FLONASE) 50 MCG/ACT nasal spray Place 1 spray into both nostrils daily.   MAGNESIUM GLYCINATE PO Take 400 mg by mouth daily.   Multiple Vitamin (MULTIVITAMIN) tablet Take 1 tablet by mouth daily. Centrum silver   Oxycodone HCl 10 MG TABS Take 1 tablet (10 mg total) by mouth every 6 (six) hours as needed (Pain).   STELARA 45 MG/0.5ML injection Inject 45 mg into the skin as directed. Every 12 weeks   timolol (TIMOPTIC) 0.5 % ophthalmic solution Place 1 drop into both eyes 2 (two) times daily.   tiZANidine (ZANAFLEX) 2 MG tablet Take 1 tablet (2 mg total) by mouth every 6 (six) hours as needed for muscle spasms.   TRAVATAN Z 0.004 % SOLN ophthalmic solution Place 1 drop into both eyes every morning.     Allergies:   Patient has no known allergies.   Social History   Socioeconomic History   Marital status: Widowed    Spouse name: Not on file    Number of children: Not on file   Years of education: Not on file   Highest education level: Associate degree: occupational, Scientist, product/process development, or vocational program  Occupational History   Not on file  Tobacco Use   Smoking status: Every Day    Current packs/day: 0.50    Average packs/day: 0.5 packs/day for 49.9 years (24.9 ttl pk-yrs)    Types: Cigarettes    Start date: 22   Smokeless tobacco: Never   Tobacco comments:    0.5 ppd  Vaping Use   Vaping status: Never Used  Substance and Sexual Activity   Alcohol use: Yes    Alcohol/week: 10.0 standard drinks of alcohol    Types: 10 Standard drinks or equivalent per week   Drug use: No   Sexual activity: Not Currently    Birth control/protection: None  Other Topics Concern   Not on file  Social History Narrative   Quality control Tech- gauges/callibrations.  (Retired)   Some colleg/tech school   wife passed  3 grown children (oldest daughter is living with them) youngest daughter lives in [place].  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- framing/art.   Social Determinants of Health   Financial Resource Strain: Low Risk  (05/23/2023)   Overall Financial Resource Strain (CARDIA)    Difficulty of Paying Living Expenses: Not very hard  Food Insecurity: No Food Insecurity (05/23/2023)   Hunger Vital Sign    Worried About Running Out of Food in the Last Year: Never true    Ran Out of Food in the Last Year: Never true  Recent Concern: Food Insecurity - Food Insecurity Present (05/16/2023)   Hunger Vital Sign    Worried About Running Out of Food in the Last Year: Never true    Ran Out of Food in the Last Year: Sometimes true  Transportation Needs: No Transportation Needs (05/23/2023)   PRAPARE - Administrator, Civil Service (Medical): No    Lack of Transportation (Non-Medical): No  Physical Activity: Inactive (05/23/2023)   Exercise Vital Sign    Days of Exercise per Week: 0 days    Minutes of  Exercise per Session: 10 min  Stress: No Stress Concern Present (05/23/2023)   [employer] of Occupational Health - Occupational Stress Questionnaire    Feeling of Stress : Only a little  Social Connections: Moderately Integrated (05/23/2023)   Social Connection and Isolation Panel [NHANES]    Frequency of Communication with Friends and Family: More than three times a week    Frequency of Social Gatherings with Friends and Family: Once a week    Attends Religious Services: 1 to 4 times per year    Active Member of [employer] or Organizations: No    Attends Engineer, structural: More than 4 times per year    Marital Status: Widowed     Family History: The patient's family history includes Alcohol abuse in his brother; Cancer in his cousin; Cancer (age of onset: 56) in his mother; Colon cancer in his cousin and maternal grandmother; Colon cancer (age of onset: 48) in his mother; Colon polyps in his mother; Depression in his brother; Endocrine tumor in his daughter; Lung disease in his father; Rosacea in his father and mother; Thyroid disease in his son. There is no history of Esophageal cancer, Rectal cancer, or Stomach cancer. ROS:   Please see the history of present illness.    All 14 point review of systems negative except as described per history of present illness.  EKGs/Labs/Other Studies Reviewed:    The following studies were reviewed today:   EKG:  EKG Interpretation Date/Time:  Monday June 19 2023 11:26:28 EST Ventricular Rate:  64 PR Interval:  116 QRS Duration:  78 QT Interval:  392 QTC Calculation: 404 R Axis:   75  Text Interpretation: Normal sinus rhythm Possible Anterior infarct , age undetermined When compared with ECG of 09-May-2023 13:25, QT has shortened Confirmed by Huntley Dec reddy [phone]) on 06/19/2023 11:56:07 AM    Recent Labs: 08/09/2022: ALT 18; TSH 1.71 09/22/2022: BUN 10; Creatinine, Ser 0.87; Potassium 3.3; Sodium 138 05/09/2023:  Hemoglobin 15.6; Platelets 142  Recent Lipid Panel    Component Value Date/Time   CHOL 178 04/24/2020 0741   TRIG 79 04/24/2020 0741   HDL 78 04/24/2020 0741   CHOLHDL 2.3 04/24/2020 0741   VLDL 9.8 04/19/2019 0725   LDLCALC 83 04/24/2020 0741    Physical Exam:    VS:  BP 134/72   Pulse  64   Ht 6\' 2"  (1.88 m)   Wt 152 lb 0.6 oz (69 kg)   SpO2 96%   BMI 19.52 kg/m     Wt Readings from Last 3 Encounters:  06/19/23 152 lb 0.6 oz (69 kg)  05/23/23 155 lb (70.3 kg)  05/09/23 155 lb (70.3 kg)     GENERAL:  Well nourished, well developed in no acute distress NECK: No JVD; No carotid bruits CARDIAC: RRR, S1 and S2 present, no murmurs, no rubs, no gallops CHEST:  Clear to auscultation without rales, wheezing or rhonchi  Extremities: No pitting pedal edema. Pulses bilaterally symmetric with radial 2+ and dorsalis pedis 2+ NEUROLOGIC:  Alert and oriented x 3  Medication Adjustments/Labs and Tests Ordered: Current medicines are reviewed at length with the patient today.  Concerns regarding medicines are outlined above.  Orders Placed This Encounter  Procedures   LONG TERM MONITOR (3-14 DAYS)   EKG 12-Lead   ECHOCARDIOGRAM COMPLETE   No orders of the defined types were placed in this encounter.   Signed, Cecille Amsterdam, MD, MPH, [facility]. 06/19/2023 12:14 PM    [facility]

## 2023-06-19 NOTE — Assessment & Plan Note (Signed)
Short brief episode during spinal fusion surgery under general anesthesia, terminated with esmolol. Unfortunately no rhythm strips available. Remains in sinus rhythm at this time. Remains asymptomatic.  Will obtain transthoracic echocardiogram to rule out any structural and functional issues. Will obtain Zio patch for 14 days to assess for any underlying asymptomatic cardiac arrhythmias.  Advised to cut back on alcohol consumption. Advised to quit smoking.

## 2023-06-19 NOTE — Patient Instructions (Signed)
Medication Instructions:  Your physician recommends that you continue on your current medications as directed. Please refer to the Current Medication list given to you today.  *If you need a refill on your cardiac medications before your next appointment, please call your pharmacy*   Lab Work: None ordered If you have labs (blood work) drawn today and your tests are completely normal, you will receive your results only by: MyChart Message (if you have MyChart) OR A paper copy in the mail If you have any lab test that is abnormal or we need to change your treatment, we will call you to review the results.   Testing/Procedures: Your physician has requested that you have an echocardiogram. Echocardiography is a painless test that uses sound waves to create images of your heart. It provides your doctor with information about the size and shape of your heart and how well your heart's chambers and valves are working. This procedure takes approximately one hour. There are no restrictions for this procedure. Please do NOT wear cologne, perfume, aftershave, or lotions (deodorant is allowed). Please arrive 15 minutes prior to your appointment time.  A zio monitor was ordered today. It will remain on for 14 days. Remove 07/03/23. You will then return monitor and event diary in provided box. It takes 1-2 weeks for report to be downloaded and returned to [name]. We will call you with the results. If monitor falls off or has orange flashing light, please call Zio for further instructions.   Follow-Up: At [facility], you and your health needs are our priority.  As part of our continuing mission to provide you with exceptional heart care, we have created designated Provider Care Teams.  These Care Teams include your primary Cardiologist (physician) and Advanced Practice Providers (APPs -  Physician Assistants and Nurse Practitioners) who all work together to provide you with the care you need, when you need  it.  We recommend signing up for the patient portal called "MyChart".  Sign up information is provided on this After Visit Summary.  MyChart is used to connect with patients for Virtual Visits (Telemedicine).  Patients are able to view lab/test results, encounter notes, upcoming appointments, etc.  Non-urgent messages can be sent to your provider as well.   To learn more about what you can do with MyChart, go to [email or web address].    Your next appointment:   Based on Results from test  The format for your next appointment:   In Person  Provider:   Vern Claude [name], MD   Other Instructions Echocardiogram An echocardiogram is a test that uses sound waves (ultrasound) to produce images of the heart. Images from an echocardiogram can provide important information about: Heart size and shape. The size and thickness and movement of your heart's walls. Heart muscle function and strength. Heart valve function or if you have stenosis. Stenosis is when the heart valves are too narrow. If blood is flowing backward through the heart valves (regurgitation). A tumor or infectious growth around the heart valves. Areas of heart muscle that are not working well because of poor blood flow or injury from a heart attack. Aneurysm detection. An aneurysm is a weak or damaged part of an artery wall. The wall bulges out from the normal force of blood pumping through the body. Tell a health care provider about: Any allergies you have. All medicines you are taking, including vitamins, herbs, eye drops, creams, and over-the-counter medicines. Any blood disorders you have. Any surgeries you have had. Any  medical conditions you have. Whether you are pregnant or may be pregnant. What are the risks? Generally, this is a safe test. However, problems may occur, including an allergic reaction to dye (contrast) that may be used during the test. What happens before the test? No specific preparation is  needed. You may eat and drink normally. What happens during the test? You will take off your clothes from the waist up and put on a hospital gown. Electrodes or electrocardiogram (ECG)patches may be placed on your chest. The electrodes or patches are then connected to a device that monitors your heart rate and rhythm. You will lie down on a table for an ultrasound exam. A gel will be applied to your chest to help sound waves pass through your skin. A handheld device, called a transducer, will be pressed against your chest and moved over your heart. The transducer produces sound waves that travel to your heart and bounce back (or "echo" back) to the transducer. These sound waves will be captured in real-time and changed into images of your heart that can be viewed on a video monitor. The images will be recorded on a computer and reviewed by your health care provider. You may be asked to change positions or hold your breath for a short time. This makes it easier to get different views or better views of your heart. In some cases, you may receive contrast through an IV in one of your veins. This can improve the quality of the pictures from your heart. The procedure may vary among health care providers and hospitals.   What can I expect after the test? You may return to your normal, everyday life, including diet, activities, and medicines, unless your health care provider tells you not to do that. Follow these instructions at home: It is up to you to get the results of your test. Ask your health care provider, or the department that is doing the test, when your results will be ready. Keep all follow-up visits. This is important. Summary An echocardiogram is a test that uses sound waves (ultrasound) to produce images of the heart. Images from an echocardiogram can provide important information about the size and shape of your heart, heart muscle function, heart valve function, and other possible heart  problems. You do not need to do anything to prepare before this test. You may eat and drink normally. After the echocardiogram is completed, you may return to your normal, everyday life, unless your health care provider tells you not to do that. This information is not intended to replace advice given to you by your health care provider. Make sure you discuss any questions you have with your health care provider. Document Revised: 03/17/2020 Document Reviewed: 03/17/2020 Elsevier Patient Education  2021 Elsevier Inc.   Important Information About Sugar

## 2023-07-03 DIAGNOSIS — H524 Presbyopia: Secondary | ICD-10-CM | POA: Diagnosis not present

## 2023-07-03 DIAGNOSIS — H401133 Primary open-angle glaucoma, bilateral, severe stage: Secondary | ICD-10-CM | POA: Diagnosis not present

## 2023-07-12 ENCOUNTER — Ambulatory Visit (HOSPITAL_BASED_OUTPATIENT_CLINIC_OR_DEPARTMENT_OTHER)
Admission: RE | Admit: 2023-07-12 | Discharge: 2023-07-12 | Disposition: A | Discharge status: Home / Self Care | Payer: Medicare HMO | Source: Ambulatory Visit

## 2023-07-12 DIAGNOSIS — F17200 Nicotine dependence, unspecified, uncomplicated: Secondary | ICD-10-CM | POA: Insufficient documentation

## 2023-07-12 DIAGNOSIS — I471 Supraventricular tachycardia, unspecified: Secondary | ICD-10-CM

## 2023-07-12 DIAGNOSIS — I4719 Other supraventricular tachycardia: Secondary | ICD-10-CM | POA: Insufficient documentation

## 2023-07-12 DIAGNOSIS — I4892 Unspecified atrial flutter: Secondary | ICD-10-CM | POA: Diagnosis not present

## 2023-07-12 DIAGNOSIS — M4316 Spondylolisthesis, lumbar region: Secondary | ICD-10-CM | POA: Diagnosis not present

## 2023-07-12 DIAGNOSIS — M5416 Radiculopathy, lumbar region: Secondary | ICD-10-CM | POA: Diagnosis not present

## 2023-07-12 LAB — ECHOCARDIOGRAM COMPLETE
AR max vel: 1.97 cm2
AV Area VTI: 1.77 cm2
AV Area mean vel: 2.03 cm2
AV Mean grad: 3 mm[Hg]
AV Peak grad: 4.6 mm[Hg]
Ao pk vel: 1.07 m/s
Area-P 1/2: 3.23 cm2
Calc EF: 48.5 %
MV M vel: 1.69 m/s
MV Peak grad: 11.4 mm[Hg]
S' Lateral: 3.6 cm
Single Plane A2C EF: 48.3 %
Single Plane A4C EF: 48.4 %

## 2023-08-06 DIAGNOSIS — I4729 Other ventricular tachycardia: Secondary | ICD-10-CM | POA: Insufficient documentation

## 2023-08-06 HISTORY — DX: Other ventricular tachycardia: I47.29

## 2023-08-10 ENCOUNTER — Telehealth: Payer: Self-pay

## 2023-08-10 DIAGNOSIS — I4729 Other ventricular tachycardia: Secondary | ICD-10-CM

## 2023-08-10 DIAGNOSIS — I471 Supraventricular tachycardia, unspecified: Secondary | ICD-10-CM

## 2023-08-10 DIAGNOSIS — I429 Cardiomyopathy, unspecified: Secondary | ICD-10-CM

## 2023-08-10 NOTE — Telephone Encounter (Signed)
 Patient is calling because he was informed he needed to have a PET stress test schedule (see results from Echo). There is not an order in yet. Patient was wanting to know if he needs to have the test completed before he see Dr. Liborio on 08/21/23. Please advise.

## 2023-08-10 NOTE — Telephone Encounter (Signed)
 PET scan ordered per Dr. Vincent Gros

## 2023-08-10 NOTE — Telephone Encounter (Signed)
 PET instructions sent to pt via My Chart. Test ordered and sent to prior auth

## 2023-08-18 ENCOUNTER — Encounter: Payer: Medicare HMO | Admitting: Family

## 2023-08-21 ENCOUNTER — Ambulatory Visit: Payer: Medicare HMO

## 2023-08-24 DIAGNOSIS — M5416 Radiculopathy, lumbar region: Secondary | ICD-10-CM | POA: Diagnosis not present

## 2023-09-04 ENCOUNTER — Encounter: Payer: Medicare HMO | Admitting: Family

## 2023-09-18 ENCOUNTER — Ambulatory Visit: Payer: Medicare HMO

## 2023-10-02 ENCOUNTER — Telehealth (HOSPITAL_COMMUNITY): Payer: Self-pay | Admitting: *Deleted

## 2023-10-02 NOTE — Telephone Encounter (Signed)
 Reaching out to patient to offer assistance regarding upcoming cardiac imaging study; pt verbalizes understanding of appt date/time, parking situation and where to check in, pre-test NPO status, and verified current allergies; name and call back number provided for further questions should they arise  Brett Brick RN Navigator Cardiac Imaging [facility] [phone] office [phone] cell  Patient aware to avoid caffeine 12 hours prior to his cardiac PET scan.

## 2023-10-03 ENCOUNTER — Ambulatory Visit (HOSPITAL_COMMUNITY)
Admission: RE | Admit: 2023-10-03 | Discharge: 2023-10-03 | Disposition: A | Discharge status: Home / Self Care | Payer: Medicare HMO | Source: Ambulatory Visit

## 2023-10-03 DIAGNOSIS — I700 Atherosclerosis of aorta: Secondary | ICD-10-CM | POA: Insufficient documentation

## 2023-10-03 DIAGNOSIS — J439 Emphysema, unspecified: Secondary | ICD-10-CM | POA: Insufficient documentation

## 2023-10-03 DIAGNOSIS — R918 Other nonspecific abnormal finding of lung field: Secondary | ICD-10-CM | POA: Diagnosis not present

## 2023-10-03 DIAGNOSIS — I471 Supraventricular tachycardia, unspecified: Secondary | ICD-10-CM | POA: Insufficient documentation

## 2023-10-03 DIAGNOSIS — I2510 Atherosclerotic heart disease of native coronary artery without angina pectoris: Secondary | ICD-10-CM | POA: Insufficient documentation

## 2023-10-03 LAB — NM PET CT CARDIAC PERFUSION MULTI W/ABSOLUTE BLOODFLOW
LV dias vol: 130 mL (ref 62–150)
LV sys vol: 91 mL
MBFR: 2.59
Nuc Rest EF: 30 %
Nuc Stress EF: 46 %
Rest MBF: 0.92 ml/g/min
Rest Nuclear Isotope Dose: 19 mCi
ST Depression (mm): 0 mm
Stress MBF: 2.38 ml/g/min
Stress Nuclear Isotope Dose: 19 mCi

## 2023-10-03 MED ORDER — RUBIDIUM RB82 GENERATOR (RUBYFILL)
19.0500 | PACK | Freq: Once | INTRAVENOUS | Status: AC
  Administered 2023-10-03: 19.05 via INTRAVENOUS

## 2023-10-03 MED ORDER — RUBIDIUM RB82 GENERATOR (RUBYFILL)
19.0000 | PACK | Freq: Once | INTRAVENOUS | Status: AC
  Administered 2023-10-03: 19 via INTRAVENOUS

## 2023-10-03 MED ORDER — REGADENOSON 0.4 MG/5ML IV SOLN
INTRAVENOUS | Status: AC
  Filled 2023-10-03: qty 5

## 2023-10-03 MED ORDER — REGADENOSON 0.4 MG/5ML IV SOLN
0.4000 mg | Freq: Once | INTRAVENOUS | Status: AC
  Administered 2023-10-03: 0.4 mg via INTRAVENOUS

## 2023-10-03 NOTE — Progress Notes (Signed)
Tolerated lexiscan well 

## 2023-10-05 ENCOUNTER — Other Ambulatory Visit: Payer: Self-pay

## 2023-10-09 ENCOUNTER — Telehealth: Payer: Self-pay

## 2023-10-09 DIAGNOSIS — Z79899 Other long term (current) drug therapy: Secondary | ICD-10-CM | POA: Diagnosis not present

## 2023-10-09 DIAGNOSIS — L400 Psoriasis vulgaris: Secondary | ICD-10-CM | POA: Diagnosis not present

## 2023-10-09 NOTE — Telephone Encounter (Signed)
 My chart message sent by Dr. Vincent Gros, he sent PCP results and message as well. He has appt to follow up on 10-11-23 to discuss results.

## 2023-10-10 NOTE — Progress Notes (Unsigned)
 Cardiology Consultation:    Date:  10/11/2023   ID:  Brett [name], DOB 02-14-1956, MRN [id number]  PCP:  Sandford Craze, NP  Cardiologist:  Luretha Murphy, MD   Referring MD: Sandford Craze, NP   No chief complaint on file.    ASSESSMENT AND PLAN:   [name] 68 year old male with history of asymptomatic nonsustained supraventricular tachycardia runs, asymptomatic nonsustained ventricular tachycardia on Zio patch recently, LVEF 50 to 55% with grade 1 diastolic dysfunction on echocardiogram December 2024, no ischemia on cardiac PET stress test 10-03-2023 but with EF reported as reduced 46% on stress images and associated with coronary calcifications here for follow-up visit to discuss the findings.  He does not have any active cardiac symptoms signs or symptoms of heart failure or angina.   Problem List Items Addressed This Visit     Nonsustained ventricular tachycardia (HCC), identified on event monitor November 2024, 1 episode 10 beats duration, asymptomatic. - Primary   Cardiomyopathy (HCC), reduced LVEF on cardiac PET imaging   No significant cardiac symptoms.  Reduced LVEF. Recent echocardiogram was normal with LVEF 50 to 55%.  Will further evaluate cardiac structure and function with a cardiac MRI given the discrepancy between the test results and new drop in LVEF noted on cardiac PET imaging reporting LVEF 46% on stress.  He is agreeable. To be scheduled and done at [facility] pending insurance approval.       Return to clinic based on test results.  History of Present Illness:    Brett [name] is a 68 y.o. male who is being seen today for follow-up visit. Last visit with me in the office was 06-19-2023. PCP is Sandford Craze, NP.   Asymptomatic supraventricular tachycardia nonsustained paroxysmal runs and supraventricular ectopic burden 1.7%, asymptomatic nonsustained ventricular tachycardia on Zio patch monitor, ventricular  ectopy burden 1%, low normal LVEF 50 to 55% with grade 1 diastolic dysfunction on echocardiogram December 2024, no significant ischemia burden on cardiac PET stress test 10-03-2023 but with LVEF calculated 46% and coronary calcifications noted [specifically RCA/LAD distribution], small 5 mm left lower lobe pulmonary nodule noted on cardiac PET/CT imaging, smokes half pack of cigarettes a day, drinks 1 to 2 glasses of wine a day.  Has longstanding history of chronic back pain and prior lumbar spinal fusion, intraoperatively during his L3-L4 fusion surgery October 2024 noted short runs of paroxysmal SVT requiring esmolol for termination.  Subsequently he saw [name]  for initial consult.  We initially evaluated with echocardiogram and Zio patch which showed short runs of NSVT and paroxysmal SVT and hence subsequently underwent cardiac PET stress test which shows no ischemia but considered EF was low on rest and stress images.  Cardiac MRI was recommended for further assessment.  Here for the visit today by himself.  Denies any ongoing active cardiac symptoms. Continues to ambulate using a rollator.  He sustained another fall in January while ambulating without his walker.  Denies any syncopal episodes.  Denies any orthopnea, paroxysmal nocturnal dyspnea, pedal edema.    Past Medical History:  Diagnosis Date   Allergy    Arthritis    Bilateral low back pain without sciatica 09/22/2015   Blood transfusion without reported diagnosis    Cervical neck pain with evidence of disc disease 09/22/2015   Chicken pox    Congenital hip deformity    Corn of foot 08/09/2022   Degenerative disc disease, cervical 01/29/2015   Eczema 05/30/2013   Erectile dysfunction 09/22/2015  Glaucoma 05/30/2013   History of cardiac dysrhythmia 05/23/2023   Isthmic spondylolisthesis 05/05/2020   Lumbar adjacent segment disease with spondylolisthesis 05/09/2023   Need for shingles vaccine 09/22/2015   Neuromuscular disorder  (HCC) 11/2019   sciatica   Nonsustained ventricular tachycardia (HCC), identified on event monitor November 2024, 1 episode 10 beats duration, asymptomatic. 08/06/2023   Pain of left hip joint 11/01/2018   Paroxysmal SVT (supraventricular tachycardia) (HCC), incidental finding IntraOp back surgery requiring esmolol; event monitor November 2024 for short runs, longest episode 17 beats. 06/19/2023   Event monitor November 2024, supraventricular ectopy burden 1.7%.  4 short runs of SVT, longest episode 17 beats.  No symptoms reported     Personal history of congenital hip dysplasia 05/30/2013   Preventative health care 01/29/2015   Rhinitis, allergic 09/22/2015   Rosacea 05/30/2013   Scoliosis deformity of spine 05/05/2020   Spinal stenosis of lumbar region 06/16/2023   Spondylolisthesis at L5-S1 level 09/14/2020   Tobacco abuse 05/30/2013    Past Surgical History:  Procedure Laterality Date   ANTERIOR LAT LUMBAR FUSION Right 05/09/2023   Procedure: Extreme Lateral Interbody Fusion Lumbar three-four -right;  Surgeon: Julio Sicks, MD;  Location: [facility] OR;  Service: Neurosurgery;  Laterality: Right;  3C   COLONOSCOPY  06/17/2015   Pyrtle   COLONOSCOPY  07/20/2020   Pyrtle   HAMMER TOE SURGERY  07/28/2011   Procedure: HAMMER TOE CORRECTION;  Surgeon: Loreta Ave, MD;  Location: [facility];  Service: Orthopedics;  Laterality: Right;  right 2nd and 4th toes correction hammer toe, capsulotomy metatarsal-phalangeal joints   HERNIA REPAIR     HIP SURGERY  1986 & 2010   rt total hip-8/10-multiple rt hip surgeries- had 4 prior to 1986 as a child   JOINT REPLACEMENT  2010   lumber fusion  09/2020   POLYPECTOMY     SPINE SURGERY  09/14/2020   THUMB ARTHROSCOPY  2008   rt    Current Medications: Current Meds  Medication Sig   ascorbic acid (VITAMIN C) 500 MG tablet Take 500 mg by mouth daily.   b complex vitamins capsule Take 1 capsule by mouth daily.   calcium carbonate (TUMS  EX) 750 MG chewable tablet Chew 1-2 tablets by mouth daily as needed for heartburn.   fluticasone (FLONASE) 50 MCG/ACT nasal spray Place 1 spray into both nostrils daily.   Multiple Vitamin (MULTIVITAMIN) tablet Take 1 tablet by mouth daily. Centrum silver   Oxycodone HCl 10 MG TABS Take 1 tablet (10 mg total) by mouth every 6 (six) hours as needed (Pain).   STELARA 45 MG/0.5ML injection Inject 45 mg into the skin as directed. Every 12 weeks   timolol (TIMOPTIC) 0.5 % ophthalmic solution Place 1 drop into both eyes 2 (two) times daily.   TRAVATAN Z 0.004 % SOLN ophthalmic solution Place 1 drop into both eyes every morning.     Allergies:   Patient has no known allergies.   Social History   Socioeconomic History   Marital status: Widowed    Spouse name: Not on file   Number of children: Not on file   Years of education: Not on file   Highest education level: Associate degree: occupational, Scientist, product/process development, or vocational program  Occupational History   Not on file  Tobacco Use   Smoking status: Every Day    Current packs/day: 0.50    Average packs/day: 0.5 packs/day for 50.2 years (25.1 ttl pk-yrs)    Types: Cigarettes  Start date: 1975   Smokeless tobacco: Never   Tobacco comments:    0.5 ppd  Vaping Use   Vaping status: Never Used  Substance and Sexual Activity   Alcohol use: Yes    Alcohol/week: 10.0 standard drinks of alcohol    Types: 10 Standard drinks or equivalent per week   Drug use: No   Sexual activity: Not Currently    Birth control/protection: None  Other Topics Concern   Not on file  Social History Narrative   Quality control Tech- gauges/callibrations.  (Retired)   Some colleg/tech school   wife passed   3 grown children (oldest daughter is living with them) youngest daughter lives in [place].  Curator at [employer].  Son lives near [place]- framing/art.   Social Drivers of Health   Financial Resource Strain: Medium Risk (08/30/2023)   Overall  Financial Resource Strain (CARDIA)    Difficulty of Paying Living Expenses: Somewhat hard  Food Insecurity: No Food Insecurity (08/30/2023)   Hunger Vital Sign    Worried About Running Out of Food in the Last Year: Never true    Ran Out of Food in the Last Year: Never true  Transportation Needs: No Transportation Needs (08/30/2023)   PRAPARE - Administrator, Civil Service (Medical): No    Lack of Transportation (Non-Medical): No  Physical Activity: Inactive (08/30/2023)   Exercise Vital Sign    Days of Exercise per Week: 0 days    Minutes of Exercise per Session: 10 min  Stress: No Stress Concern Present (08/30/2023)   [employer] of Occupational Health - Occupational Stress Questionnaire    Feeling of Stress : Not at all  Social Connections: Moderately Integrated (08/30/2023)   Social Connection and Isolation Panel [NHANES]    Frequency of Communication with Friends and Family: Three times a week    Frequency of Social Gatherings with Friends and Family: Once a week    Attends Religious Services: More than 4 times per year    Active Member of [employer] or Organizations: Yes    Attends Banker Meetings: More than 4 times per year    Marital Status: Widowed     Family History: The patient's family history includes Alcohol abuse in his brother; Cancer in his cousin; Cancer (age of onset: 64) in his mother; Colon cancer in his cousin and maternal grandmother; Colon cancer (age of onset: 29) in his mother; Colon polyps in his mother; Depression in his brother; Endocrine tumor in his daughter; Lung disease in his father; Rosacea in his father and mother; Thyroid disease in his son. There is no history of Esophageal cancer, Rectal cancer, or Stomach cancer. ROS:   Please see the history of present illness.    All 14 point review of systems negative except as described per history of present illness.  EKGs/Labs/Other Studies Reviewed:    The following studies were  reviewed today:   EKG:       Recent Labs: 05/09/2023: Hemoglobin 15.6; Platelets 142  Recent Lipid Panel    Component Value Date/Time   CHOL 178 04/24/2020 0741   TRIG 79 04/24/2020 0741   HDL 78 04/24/2020 0741   CHOLHDL 2.3 04/24/2020 0741   VLDL 9.8 04/19/2019 0725   LDLCALC 83 04/24/2020 0741    Physical Exam:    VS:  BP 132/82   Pulse 82   Ht 6\' 2"  (1.88 m)   Wt 152 lb (68.9 kg)   SpO2 94%  BMI 19.52 kg/m     Wt Readings from Last 3 Encounters:  10/11/23 152 lb (68.9 kg)  06/19/23 152 lb 0.6 oz (69 kg)  05/23/23 155 lb (70.3 kg)     GENERAL:  Well nourished, well developed in no acute distress NECK: No JVD;  CARDIAC: RRR, S1 and S2 present, no murmurs, no rubs, no gallops CHEST:  Clear to auscultation without rales, wheezing or rhonchi  Extremities: No pitting pedal edema. Pulses bilaterally symmetric with radial 2+ and dorsalis pedis 2+ NEUROLOGIC:  Alert and oriented x 3  Medication Adjustments/Labs and Tests Ordered: Current medicines are reviewed at length with the patient today.  Concerns regarding medicines are outlined above.  No orders of the defined types were placed in this encounter.  No orders of the defined types were placed in this encounter.   Signed, Cecille Amsterdam, MD, MPH, [facility]. 10/11/2023 9:19 AM    [facility]

## 2023-10-11 ENCOUNTER — Ambulatory Visit: Payer: Medicare HMO

## 2023-10-11 VITALS — BP 132/82 | HR 82 | Ht 74.0 in | Wt 152.0 lb

## 2023-10-11 DIAGNOSIS — I429 Cardiomyopathy, unspecified: Secondary | ICD-10-CM | POA: Insufficient documentation

## 2023-10-11 DIAGNOSIS — I4729 Other ventricular tachycardia: Secondary | ICD-10-CM | POA: Diagnosis not present

## 2023-10-11 NOTE — Assessment & Plan Note (Signed)
 No significant cardiac symptoms.  Reduced LVEF. Recent echocardiogram was normal with LVEF 50 to 55%.  Will further evaluate cardiac structure and function with a cardiac MRI given the discrepancy between the test results and new drop in LVEF noted on cardiac PET imaging reporting LVEF 46% on stress.  He is agreeable. To be scheduled and done at [facility] pending insurance approval.

## 2023-10-11 NOTE — Patient Instructions (Addendum)
 Medication Instructions:  Your physician recommends that you continue on your current medications as directed. Please refer to the Current Medication list given to you today.  *If you need a refill on your cardiac medications before your next appointment, please call your pharmacy*   Lab Work: Your physician recommends that you have a H&H today in the office.  If you have labs (blood work) drawn today and your tests are completely normal, you will receive your results only by: MyChart Message (if you have MyChart) OR A paper copy in the mail If you have any lab test that is abnormal or we need to change your treatment, we will call you to review the results.   Testing/Procedures:   You are scheduled for Cardiac MRI at the location below. ?  [facility] [address] [place], [place] [address] Please take advantage of the free valet parking available at the [facility] and [employer] (Entrance C).  Proceed to the [facility] Radiology Department (First Floor) for check-in.   Magnetic resonance imaging (MRI) is a painless test that produces images of the inside of the body without using Xrays.  During an MRI, strong magnets and radio waves work together in a Data processing manager to form detailed images.   MRI images may provide more details about a medical condition than X-rays, CT scans, and ultrasounds can provide.  You may be given earphones to listen for instructions.  You may eat a light breakfast and take medications as ordered with the exception of furosemide, hydrochlorothiazide, chlorthalidone or spironolactone (or any other fluid pill). If you are undergoing a stress MRI, please avoid stimulants for 12 hr prior to test. (I.e. Caffeine, nicotine, chocolate, or antihistamine medications)  An IV will be inserted into one of your veins. Contrast material will be injected into your IV. It will leave your body through your urine within a day. You may be told to drink  plenty of fluids to help flush the contrast material out of your system.  You will be asked to remove all metal, including: Watch, jewelry, and other metal objects including hearing aids, hair pieces and dentures. Also wearable glucose monitoring systems (ie. Freestyle Libre and Omnipods) (Braces and fillings normally are not a problem.)   TEST WILL TAKE APPROXIMATELY 1 HOUR  PLEASE NOTIFY SCHEDULING AT LEAST 24 HOURS IN ADVANCE IF YOU ARE UNABLE TO KEEP YOUR APPOINTMENT. [phone]  For more information and frequently asked questions, please visit our website : [email or web address]  Please call the Cardiac Imaging Nurse Navigators with any questions/concerns. [phone] Office     Follow-Up: At [facility], you and your health needs are our priority.  As part of our continuing mission to provide you with exceptional heart care, we have created designated Provider Care Teams.  These Care Teams include your primary Cardiologist (physician) and Advanced Practice Providers (APPs -  Physician Assistants and Nurse Practitioners) who all work together to provide you with the care you need, when you need it.  We recommend signing up for the patient portal called "MyChart".  Sign up information is provided on this After Visit Summary.  MyChart is used to connect with patients for Virtual Visits (Telemedicine).  Patients are able to view lab/test results, encounter notes, upcoming appointments, etc.  Non-urgent messages can be sent to your provider as well.   To learn more about what you can do with MyChart, go to [email or web address].    Your next appointment:   Follow up based on  results  The format for your next appointment:   In Person  Provider:   Huntley Dec, MD    Other Instructions Nuclear Medicine Exam: What to Expect A nuclear medicine exam is a safe and painless imaging test. It can help your health care provider find diseases. It also shows  how your organs work and how they're structured. For the exam, you'll be given a radioactive substance called a tracer. This will be absorbed by your body's organs. The tracer will show up when the scanner takes pictures of your body. There are a few kinds of this exam. They include: A CT scan. An MRI. A PET scan. A SPECT scan. Tell your health care provider about: Any allergies you have. All medicines you take. These include vitamins, herbs, eye drops, and creams. Any problems you or family members have had with anesthesia. Any bleeding problems you have. Any surgeries you've had. Any medical conditions you have. Whether you're pregnant, may be pregnant, or are breastfeeding. What are the risks? Your provider will talk with you about risks. These may include: Exposure to a small amount of radiation. An allergic reaction to the tracer. This is rare. What happens before the exam? Medicines Ask about changing or stopping: Any medicines you take. Any vitamins, herbs, or supplements you take. Do not take aspirin or ibuprofen unless you're told to. General instructions Eat and drink as told. Do not wear jewelry. Wear loose, comfortable clothes. You may be asked to wear a hospital gown for the exam. Bring past imaging studies with you if you have them. These include X-rays. What happens during the exam?  An IV will be put into a vein in your hand or arm. You'll be asked to lie on a table or sit in a chair. You'll be given the tracer. It may be given as: A pill or liquid to swallow. A shot. Medicine through your IV. A gas to breathe in. A large scanning machine will be used to make pictures of your body. After the pictures are taken, you may have to wait until your provider can make sure that enough pictures were taken. These steps may vary. Ask what you can expect. What can I expect after the exam? Ask when your exam results will be ready and how to get them. You may need to call  or meet with your provider to get your results. Also ask: What are my treatment options? What other tests do I need? What are my next steps? Follow these instructions at home: Drink more fluids as told. You may need to drink 6-8 glasses of water. This helps to remove the tracer from your body. Ask what things are safe for you to do at home. Ask when you can go back to work or school. Get help right away if: You have trouble breathing. This symptom may be an emergency. Call 911 right away. Do not wait to see if the symptom will go away. Do not drive yourself to the hospital. This information is not intended to replace advice given to you by your health care provider. Make sure you discuss any questions you have with your health care provider. Document Revised: 02/08/2023 Document Reviewed: 02/08/2023 Elsevier Patient Education  2024 Elsevier Inc.  Important Information About Sugar

## 2023-10-12 DIAGNOSIS — M5416 Radiculopathy, lumbar region: Secondary | ICD-10-CM | POA: Diagnosis not present

## 2023-10-12 DIAGNOSIS — Z79899 Other long term (current) drug therapy: Secondary | ICD-10-CM | POA: Diagnosis not present

## 2023-10-12 DIAGNOSIS — L400 Psoriasis vulgaris: Secondary | ICD-10-CM | POA: Diagnosis not present

## 2023-10-12 DIAGNOSIS — M4316 Spondylolisthesis, lumbar region: Secondary | ICD-10-CM | POA: Diagnosis not present

## 2023-10-12 LAB — HEMOGLOBIN AND HEMATOCRIT, BLOOD
Hematocrit: 44.3 % (ref 37.5–51.0)
Hemoglobin: 15.5 g/dL (ref 13.0–17.7)

## 2023-10-17 ENCOUNTER — Encounter (HOSPITAL_COMMUNITY): Payer: Self-pay

## 2023-11-13 ENCOUNTER — Telehealth: Payer: Self-pay | Admitting: Family

## 2023-11-13 ENCOUNTER — Encounter (HOSPITAL_COMMUNITY): Payer: Self-pay

## 2023-11-13 DIAGNOSIS — Z720 Tobacco use: Secondary | ICD-10-CM

## 2023-11-13 NOTE — Telephone Encounter (Signed)
 See order(s).

## 2023-11-15 ENCOUNTER — Ambulatory Visit (HOSPITAL_COMMUNITY)
Admission: RE | Admit: 2023-11-15 | Discharge: 2023-11-15 | Disposition: A | Discharge status: Home / Self Care | Source: Ambulatory Visit

## 2023-11-15 ENCOUNTER — Other Ambulatory Visit: Payer: Self-pay

## 2023-11-15 DIAGNOSIS — I429 Cardiomyopathy, unspecified: Secondary | ICD-10-CM | POA: Diagnosis not present

## 2023-11-15 MED ORDER — GADOBUTROL 1 MMOL/ML IV SOLN
10.0000 mL | Freq: Once | INTRAVENOUS | Status: AC | PRN
  Administered 2023-11-15: 10 mL via INTRAVENOUS

## 2023-11-20 ENCOUNTER — Telehealth: Payer: Self-pay

## 2023-11-20 NOTE — Telephone Encounter (Signed)
 Advised that results are not completed. Pt verbalized understanding and had no additional questions

## 2023-11-20 NOTE — Telephone Encounter (Signed)
 Pt is requesting a callback regarding results since he's seen them on MyChart but he'd like to speak with MD or nurse. Please advise

## 2023-11-23 ENCOUNTER — Ambulatory Visit (HOSPITAL_BASED_OUTPATIENT_CLINIC_OR_DEPARTMENT_OTHER)
Admission: RE | Admit: 2023-11-23 | Discharge: 2023-11-23 | Disposition: A | Discharge status: Home / Self Care | Source: Ambulatory Visit | Attending: Family | Admitting: Family

## 2023-11-23 DIAGNOSIS — R918 Other nonspecific abnormal finding of lung field: Secondary | ICD-10-CM | POA: Diagnosis not present

## 2023-11-23 DIAGNOSIS — Z122 Encounter for screening for malignant neoplasm of respiratory organs: Secondary | ICD-10-CM | POA: Insufficient documentation

## 2023-11-23 DIAGNOSIS — J439 Emphysema, unspecified: Secondary | ICD-10-CM | POA: Insufficient documentation

## 2023-11-23 DIAGNOSIS — I700 Atherosclerosis of aorta: Secondary | ICD-10-CM | POA: Insufficient documentation

## 2023-11-23 DIAGNOSIS — Z720 Tobacco use: Secondary | ICD-10-CM | POA: Insufficient documentation

## 2023-11-23 DIAGNOSIS — F17210 Nicotine dependence, cigarettes, uncomplicated: Secondary | ICD-10-CM | POA: Insufficient documentation

## 2023-11-30 ENCOUNTER — Encounter: Payer: Self-pay | Admitting: Family

## 2023-12-04 ENCOUNTER — Encounter (HOSPITAL_BASED_OUTPATIENT_CLINIC_OR_DEPARTMENT_OTHER): Payer: Self-pay

## 2023-12-04 ENCOUNTER — Other Ambulatory Visit: Payer: Self-pay

## 2023-12-04 ENCOUNTER — Other Ambulatory Visit (HOSPITAL_BASED_OUTPATIENT_CLINIC_OR_DEPARTMENT_OTHER): Payer: Self-pay

## 2023-12-04 ENCOUNTER — Telehealth: Payer: Self-pay

## 2023-12-04 DIAGNOSIS — R911 Solitary pulmonary nodule: Secondary | ICD-10-CM

## 2023-12-04 DIAGNOSIS — I429 Cardiomyopathy, unspecified: Secondary | ICD-10-CM

## 2023-12-04 MED ORDER — METOPROLOL SUCCINATE ER 25 MG PO TB24
25.0000 mg | ORAL_TABLET | Freq: Every day | ORAL | 3 refills | Status: AC
  Filled 2023-12-04: qty 90, 90d supply, fill #0

## 2023-12-04 NOTE — Telephone Encounter (Signed)
Sending to DOD

## 2023-12-04 NOTE — Telephone Encounter (Signed)
 Copied from CRM [id number]. Topic: Clinical - Lab/Test Results >> Dec 04, 2023  4:14 PM Chuck Crater wrote: Reason for CRM: [facility] wanted to make sure that we recieved the results for patient ct lung screening. Tiffany would like a courtesy callback so she can let the radiologist know.

## 2023-12-04 NOTE — Telephone Encounter (Signed)
 IMPRESSION: 1. New nodular consolidation in the posterior left lower lobe measures at least 11.7 mm. Lung-RADS 4B, suspicious. Additional imaging evaluation or consultation with Pulmonology or Thoracic Surgery recommended. These results will be called to the ordering clinician or representative by the Radiologist Assistant, and communication documented in the PACS or [employer]. 2. Proximal/mid gastric wall thickening. 3. Aortic atherosclerosis (ICD10-I70.0). Coronary artery calcification. 4.  Emphysema (ICD10-J43.9).     Electronically Signed   By: Shearon Denis M.D.   On: 12/04/2023 15:42

## 2023-12-05 DIAGNOSIS — R911 Solitary pulmonary nodule: Secondary | ICD-10-CM | POA: Insufficient documentation

## 2023-12-05 NOTE — Telephone Encounter (Signed)
 Reviewed abnormal CT lung findings of LLL mass.  Discussed with patient and daughter need to see pulmonology for consultation. Referral placed and pt/daughter were given number for pulmonology to call to schedule.

## 2023-12-05 NOTE — Addendum Note (Signed)
 Addended by: Dorrene Gaucher on: 12/05/2023 12:47 PM   Modules accepted: Orders

## 2023-12-06 ENCOUNTER — Encounter: Payer: Self-pay | Admitting: Family

## 2023-12-06 ENCOUNTER — Ambulatory Visit (INDEPENDENT_AMBULATORY_CARE_PROVIDER_SITE_OTHER): Admitting: Family

## 2023-12-06 VITALS — BP 130/84 | HR 72 | Temp 98.6°F | Resp 16 | Ht 74.0 in | Wt 154.0 lb

## 2023-12-06 DIAGNOSIS — Z125 Encounter for screening for malignant neoplasm of prostate: Secondary | ICD-10-CM

## 2023-12-06 DIAGNOSIS — Z720 Tobacco use: Secondary | ICD-10-CM

## 2023-12-06 DIAGNOSIS — R739 Hyperglycemia, unspecified: Secondary | ICD-10-CM

## 2023-12-06 DIAGNOSIS — R911 Solitary pulmonary nodule: Secondary | ICD-10-CM

## 2023-12-06 DIAGNOSIS — Z0001 Encounter for general adult medical examination with abnormal findings: Secondary | ICD-10-CM | POA: Diagnosis not present

## 2023-12-06 DIAGNOSIS — Z0000 Encounter for general adult medical examination without abnormal findings: Secondary | ICD-10-CM

## 2023-12-06 DIAGNOSIS — Z1211 Encounter for screening for malignant neoplasm of colon: Secondary | ICD-10-CM

## 2023-12-06 MED ORDER — VARENICLINE TARTRATE (STARTER) 0.5 MG X 11 & 1 MG X 42 PO TBPK: ORAL_TABLET | ORAL | 0 refills | Status: DC

## 2023-12-06 NOTE — Progress Notes (Signed)
 Subjective:     Patient ID: Brett [name], male    DOB: Sep 20, 1955, 68 y.o.   MRN: [id number]  Chief Complaint  Patient presents with   Annual Exam    HPI  Discussed the use of AI scribe software for clinical note transcription with the patient, who gave verbal consent to proceed.  History of Present Illness  Brett [name] is a 68 year old male who presents for an annual physical exam. He is concerned about a lung nodule identified on a recent CT scan, absent in the previous year's scan. There is a family history of pulmonary fibrosis affecting his father, uncle, and aunt. He has an upcoming appointment with a pulmonary specialist.   He has ongoing back issues, including three hernias and two surgeries, with the latest in January for a fractured disc. He experiences hip pain and has been unable to drive for five months.   He is gradually increasing physical activity, using a walker and attempting short walks. He has cardiac issues, including plaque buildup revealed by a stress test and irregular heartbeats since surgery in October. He has undergone stress and cardiac MRIs and is scheduled for genetic testing due to a family history of heart issues. A heart-related test is planned for August.   He has toenail fungus on one toe, causing occasional pain, managed with debridement being performed during pedicures.   He experiences congestion and cough, attributed to post-nasal drip, with limited relief from Flonase  and saline spray.   He maintains a healthy diet, eating two meals a day, and has started eating poached eggs and fruit for breakfast. He is attempting to increase physical activity despite back issues. He has glaucoma and cataracts in his left eye, which are slowly worsening, and is up to date with eye exams. He is also current with dental care, having had a recent cleaning and minor repair on a chipped tooth. No current headaches, depression, or anxiety. Regular bowel  movements and no urinary concerns.   Immunizations: up to date Diet: healthy 2 meals a day Exercise:  limited due to back pain Colonoscopy: due in July Vision: up to date Dental:  up to date   Lab Results  Component Value Date   PSA 1.01 12/06/2023   PSA 2.71 08/09/2022   PSA 1.10 04/26/2021         Health Maintenance Due  Topic Date Due   COVID-19 Vaccine (8 - Pfizer risk 2024-25 season) 10/26/2023   Colonoscopy  02/19/2024    Past Medical History:  Diagnosis Date   Allergy    Arthritis    Bilateral low back pain without sciatica 09/22/2015   Blood transfusion without reported diagnosis    Cervical neck pain with evidence of disc disease 09/22/2015   Chicken pox    Congenital hip deformity    Corn of foot 08/09/2022   Degenerative disc disease, cervical 01/29/2015   Eczema 05/30/2013   Erectile dysfunction 09/22/2015   Glaucoma 05/30/2013   History of cardiac dysrhythmia 05/23/2023   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 08/06/2023   Pain of left hip joint 11/01/2018   Paroxysmal SVT (supraventricular tachycardia) (HCC), incidental finding IntraOp back surgery requiring esmolol ; event monitor November 2024 for short runs, longest episode 17  beats. 06/19/2023   Event monitor November 2024, supraventricular ectopy burden 1.7%.  4 short runs of SVT, longest episode 17 beats.  No symptoms reported     Personal history of congenital hip dysplasia 05/30/2013   Preventative health care 01/29/2015   Rhinitis, allergic 09/22/2015   Rosacea 05/30/2013   Scoliosis deformity of spine 05/05/2020   Spinal stenosis of lumbar region 06/16/2023   Spondylolisthesis at L5-S1 level 09/14/2020    Tobacco abuse 05/30/2013    Past Surgical History:  Procedure Laterality Date   ANTERIOR LAT LUMBAR FUSION Right 05/09/2023   Procedure: Extreme Lateral Interbody Fusion Lumbar three-four -right;  Surgeon: Agustina Aldrich, MD;  Location: [facility] OR;  Service: Neurosurgery;  Laterality: Right;  3C   COLONOSCOPY  06/17/2015   Pyrtle   COLONOSCOPY  07/20/2020   Pyrtle   HAMMER TOE SURGERY  07/28/2011   Procedure: HAMMER TOE CORRECTION;  Surgeon: Ferd Householder, MD;  Location: [facility];  Service: Orthopedics;  Laterality: Right;  right 2nd and 4th toes correction hammer toe, capsulotomy metatarsal-phalangeal joints   HERNIA REPAIR     HIP SURGERY  1986 & 2010   rt total hip-8/10-multiple rt hip surgeries- had 4 prior to 1986 as a child   JOINT REPLACEMENT  2010   lumber fusion  09/2020   POLYPECTOMY     SPINE SURGERY  09/14/2020   THUMB ARTHROSCOPY  2008   rt    Family History  Problem Relation Age of Onset   Cancer Mother 91       history of colon cancer   Rosacea Mother    Colon cancer Mother 22   Colon polyps Mother    Rosacea Father    Lung disease Father        ?pulmonary fibrosis   Alcohol abuse Brother    Depression Brother    Colon cancer Maternal Grandmother    Endocrine tumor Daughter        pituitary tumor, POTTS   Thyroid  disease Son        ?hyperthyroid   Cancer Cousin        colon   Colon cancer Cousin    Esophageal cancer Neg Hx    Rectal cancer Neg Hx    Stomach cancer Neg Hx     Social History   Socioeconomic History   Marital status: Widowed    Spouse name: Not on file   Number of children: Not on file   Years of education: Not on file   Highest education level: Associate degree: occupational, Scientist, product/process development, or vocational program  Occupational History   Not on file  Tobacco Use   Smoking status: Every Day    Current packs/day: 0.50    Average packs/day: 0.5 packs/day for 50.3 years (25.2 ttl pk-yrs)    Types: Cigarettes    Start date:  47   Smokeless tobacco: Never   Tobacco comments:    0.5 ppd  Vaping Use   Vaping status: Never Used  Substance and Sexual Activity   Alcohol use: Yes    Alcohol/week: 10.0 standard drinks of alcohol    Types: 10 Standard drinks or equivalent per week   Drug use: No   Sexual activity: Not Currently    Birth control/protection: None  Other Topics Concern   Not on file  Social History Narrative   Quality control Tech- gauges/callibrations.  (Retired)   Some colleg/tech school   wife passed   3 grown children (oldest daughter is living  with them) youngest daughter lives in [place].  Curator at [employer].  Son lives near [place]- framing/art.   Social Drivers of Health   Financial Resource Strain: Medium Risk (12/03/2023)   Overall Financial Resource Strain (CARDIA)    Difficulty of Paying Living Expenses: Somewhat hard  Food Insecurity: No Food Insecurity (12/03/2023)   Hunger Vital Sign    Worried About Running Out of Food in the Last Year: Never true    Ran Out of Food in the Last Year: Never true  Transportation Needs: No Transportation Needs (12/03/2023)   PRAPARE - Administrator, Civil Service (Medical): No    Lack of Transportation (Non-Medical): No  Physical Activity: Inactive (12/03/2023)   Exercise Vital Sign    Days of Exercise per Week: 0 days    Minutes of Exercise per Session: 10 min  Stress: No Stress Concern Present (12/03/2023)   [employer] of Occupational Health - Occupational Stress Questionnaire    Feeling of Stress : Not at all  Social Connections: Moderately Integrated (12/03/2023)   Social Connection and Isolation Panel [NHANES]    Frequency of Communication with Friends and Family: Three times a week    Frequency of Social Gatherings with Friends and Family: Once a week    Attends Religious Services: More than 4 times per year    Active Member of [employer] or Organizations: Yes    Attends Banker Meetings: More than  4 times per year    Marital Status: Widowed  Intimate Partner Violence: Not At Risk (02/16/2023)   Humiliation, Afraid, Rape, and Kick questionnaire    Fear of Current or Ex-Partner: No    Emotionally Abused: No    Physically Abused: No    Sexually Abused: No    Outpatient Medications Prior to Visit  Medication Sig Dispense Refill   calcium  carbonate (TUMS EX) 750 MG chewable tablet Chew 1-2 tablets by mouth daily as needed for heartburn.     metoprolol  succinate (TOPROL  XL) 25 MG 24 hr tablet Take 1 tablet (25 mg total) by mouth daily. 90 tablet 3   metoprolol  tartrate (LOPRESSOR ) 25 MG tablet Take 25 mg by mouth 2 (two) times daily.     Multiple Vitamin (MULTIVITAMIN) tablet Take 1 tablet by mouth daily. Centrum silver     Oxycodone  HCl 10 MG TABS Take 1 tablet (10 mg total) by mouth every 6 (six) hours as needed (Pain). 30 tablet 0   STELARA 45 MG/0.5ML injection Inject 45 mg into the skin as directed. Every 12 weeks     timolol  (TIMOPTIC ) 0.5 % ophthalmic solution Place 1 drop into both eyes 2 (two) times daily.     TRAVATAN Z 0.004 % SOLN ophthalmic solution Place 1 drop into both eyes every morning.  1   Ustekinumab (STELARA IV) Inject into the vein.     ascorbic acid  (VITAMIN C ) 500 MG tablet Take 500 mg by mouth daily.     b complex vitamins capsule Take 1 capsule by mouth daily.     fluticasone  (FLONASE ) 50 MCG/ACT nasal spray Place 1 spray into both nostrils daily.     No facility-administered medications prior to visit.    No Known Allergies  Review of Systems  Constitutional:  Negative for weight loss.  HENT:  Positive for congestion (rhinorrhea). Negative for hearing loss.   Eyes:  Negative for blurred vision.  Respiratory:  Negative for cough.   Cardiovascular:  Negative for leg swelling.  Gastrointestinal:  Negative for constipation and diarrhea.  Genitourinary:  Negative for dysuria and frequency.  Musculoskeletal:  Positive for back pain.  Skin:  Negative for  rash.  Neurological:  Negative for headaches.  Psychiatric/Behavioral:         Denies depression/anxiety       Objective:    Physical Exam   BP 130/84 (BP Location: Left Arm, Patient Position: Sitting, Cuff Size: Normal)   Pulse 72   Temp 98.6 F (37 C) (Oral)   Resp 16   Ht 6\' 2"  (1.88 m)   Wt 154 lb (69.9 kg)   SpO2 95%   BMI 19.77 kg/m  Wt Readings from Last 3 Encounters:  12/07/23 154 lb (69.9 kg)  12/06/23 154 lb (69.9 kg)  10/11/23 152 lb (68.9 kg)   Physical Exam  Constitutional: He is oriented to person, place, and time. He appears well-developed and well-nourished. No distress.  HENT:  Head: Normocephalic and atraumatic.  Right Ear: Tympanic membrane and ear canal normal.  Left Ear: Tympanic membrane and ear canal normal.  Mouth/Throat: Oropharynx is clear and moist.  Eyes: Pupils are equal, round, and reactive to light. No scleral icterus.  Neck: Normal range of motion. No thyromegaly present.  Cardiovascular: Normal rate and regular rhythm.   No murmur heard. Pulmonary/Chest: Effort normal and breath sounds normal. No respiratory distress. He has no wheezes. He has no rales. He exhibits no tenderness.  Abdominal: Soft. Bowel sounds are normal. He exhibits no distension and no mass. There is no tenderness. There is no rebound and no guarding.  Musculoskeletal: He exhibits no edema.  Lymphadenopathy:    He has no cervical adenopathy.  Neurological: He is alert and oriented to person, place, and time. He has normal patellar reflexes. He exhibits normal muscle tone. Coordination normal.  Skin: Skin is warm and dry. Hypertrophic right second toenail Psychiatric: He has a normal mood and affect. His behavior is normal. Judgment and thought content normal.           Assessment & Plan:       Assessment & Plan:   Problem List Items Addressed This Visit       Unprioritized   Tobacco abuse   Trial of chantix . Common side effects including rare risk of  suicide ideation was discussed with the patient today.  Patient is instructed to go directly to the ED if this occurs.  We discussed that patient can continue to smoke for 1 week after starting chantix , but then must discontinue cigarettes.  He is also instructed to contact us  prior to completion of the starter month pack for an rx for the continuation month pack.  5 minutes spent with patient today on tobacco cessation counseling.        Relevant Medications   Varenicline  Tartrate, Starter, (CHANTIX  STARTING MONTH PAK) 0.5 MG X 11 & 1 MG X 42 TBPK   Preventative health care - Primary    Routine visit to review health status and preventive care. Discussed recent health events and lifestyle habits.  - Order standard blood work.  - Schedule colonoscopy for July.  - Update PSA test in labs.       Lung nodule seen on imaging study    New lung nodule identified, high suspicion for lung cancer due to smoking history. Discussed that early detection suggests favorable prognosis if cancerous.  - Proceed with pulmonology appointment for further evaluation. -  Discuss potential biopsy or PET scan with pulmonologist.  Other Visit Diagnoses       Screening for colon cancer       Relevant Orders   Ambulatory referral to Gastroenterology     Screening for prostate cancer       Relevant Orders   PSA (Completed)     Hyperglycemia       Relevant Orders   Comprehensive metabolic panel with GFR (Completed)       I have discontinued [place]. Morimoto's b complex vitamins, ascorbic acid , and fluticasone . I am also having him start on Varenicline  Tartrate (Starter). Additionally, I am having him maintain his Travatan Z, multivitamin, timolol , calcium  carbonate, Oxycodone  HCl, Stelara, metoprolol  succinate, Ustekinumab (STELARA IV), and metoprolol  tartrate.  Meds ordered this encounter  Medications   Varenicline  Tartrate, Starter, (CHANTIX  STARTING MONTH PAK) 0.5 MG X 11 & 1 MG X 42 TBPK     Sig: Take per package instructions    Dispense:  53 each    Refill:  0    Supervising Provider:   Randie Bustle A [[id number]]

## 2023-12-07 ENCOUNTER — Ambulatory Visit

## 2023-12-07 ENCOUNTER — Encounter: Payer: Self-pay | Admitting: Family

## 2023-12-07 VITALS — BP 138/74 | HR 66 | Ht 74.0 in | Wt 154.0 lb

## 2023-12-07 DIAGNOSIS — I429 Cardiomyopathy, unspecified: Secondary | ICD-10-CM | POA: Diagnosis not present

## 2023-12-07 LAB — COMPREHENSIVE METABOLIC PANEL WITH GFR
ALT: 16 U/L (ref 0–53)
AST: 25 U/L (ref 0–37)
Albumin: 4.2 g/dL (ref 3.5–5.2)
Alkaline Phosphatase: 49 U/L (ref 39–117)
BUN: 17 mg/dL (ref 6–23)
CO2: 29 meq/L (ref 19–32)
Calcium: 9.4 mg/dL (ref 8.4–10.5)
Chloride: 99 meq/L (ref 96–112)
Creatinine, Ser: 0.82 mg/dL (ref 0.40–1.50)
GFR: 89.66 mL/min (ref >60.00)
Glucose, Bld: 96 mg/dL (ref 70–99)
Potassium: 3.9 meq/L (ref 3.5–5.1)
Sodium: 137 meq/L (ref 135–145)
Total Bilirubin: 0.6 mg/dL (ref 0.2–1.2)
Total Protein: 6.8 g/dL (ref 6.0–8.3)

## 2023-12-07 LAB — PSA: PSA: 1.01 ng/mL (ref 0.10–4.00)

## 2023-12-07 MED ORDER — EMPAGLIFLOZIN 10 MG PO TABS: 10.0000 mg | ORAL_TABLET | Freq: Every day | ORAL | 1 refills | Status: DC

## 2023-12-07 MED ORDER — ENTRESTO 24-26 MG PO TABS: 1.0000 | ORAL_TABLET | Freq: Two times a day (BID) | ORAL | 2 refills | Status: DC

## 2023-12-07 MED ORDER — ASPIRIN 81 MG PO TBEC: 81.0000 mg | DELAYED_RELEASE_TABLET | Freq: Every day | ORAL | Status: AC

## 2023-12-07 NOTE — Patient Instructions (Addendum)
 Medication Instructions:  Start Entresto  24-26 mg 1 tablet twice a day   Start Aspirin  81 mg once a day   Start jardiance  10 mg once a day    *If you need a refill on your cardiac medications before your next appointment, please call your pharmacy*   Lab Work: Your physician recommends that you return for lab work in: 2 weeks   You can have these done at [address] at  [facility] You need to have labs done when you are fasting.  You can come Monday through Friday 8:00 am to 11:30 am and 1:00 to 4:00. You do not need to make an appointment as the order has already been placed. The labs you are going to have done are BMET.  If you have labs (blood work) drawn today and your tests are completely normal, you will receive your results only by: MyChart Message (if you have MyChart) OR A paper copy in the mail If you have any lab test that is abnormal or we need to change your treatment, we will call you to review the results.   Testing/Procedures: None Ordered   Follow-Up: At [facility], you and your health needs are our priority.  As part of our continuing mission to provide you with exceptional heart care, we have created designated Provider Care Teams.  These Care Teams include your primary Cardiologist (physician) and Advanced Practice Providers (APPs -  Physician Assistants and Nurse Practitioners) who all work together to provide you with the care you need, when you need it.  We recommend signing up for the patient portal called "MyChart".  Sign up information is provided on this After Visit Summary.  MyChart is used to connect with patients for Virtual Visits (Telemedicine).  Patients are able to view lab/test results, encounter notes, upcoming appointments, etc.  Non-urgent messages can be sent to your provider as well.   To learn more about what you can do with MyChart, go to [email or web address].    Your next appointment:   1 year follow up

## 2023-12-07 NOTE — Assessment & Plan Note (Signed)
Trial of chantix. Common side effects including rare risk of suicide ideation was discussed with the patient today.  Patient is instructed to go directly to the ED if this occurs.  We discussed that patient can continue to smoke for 1 week after starting chantix, but then must discontinue cigarettes.  He is also instructed to contact us prior to completion of the starter month pack for an rx for the continuation month pack.  5 minutes spent with patient today on tobacco cessation counseling.   

## 2023-12-07 NOTE — Assessment & Plan Note (Signed)
  Routine visit to review health status and preventive care. Discussed recent health events and lifestyle habits.  - Order standard blood work.  - Schedule colonoscopy for July.  - Update PSA test in labs.

## 2023-12-07 NOTE — Progress Notes (Signed)
 Cardiology Consultation:    Date:  12/07/2023   ID:  Brett [name], DOB Dec 08, 1955, MRN [id number]  PCP:  Dorrene Gaucher, NP  Cardiologist:  Daymon Evans Dystany Duffy, MD   Referring MD: Dorrene Gaucher, NP   Chief Complaint  Patient presents with   Follow-up     ASSESSMENT AND PLAN:   [name] 68 year old male was initially seen for short runs of paroxysmal SVT noted during spine surgery in October 2024 requiring esmolol  for termination, subsequently Zio patch noted asymptomatic supraventricular tachycardia burden 1.7% and asymptomatic runs of nonsustained ventricular tachycardia on Zio patch monitor with ventricular ectopy burden 1%, low normal LVEF 50 to 55% on echocardiogram December 2024 and no ischemia on cardiac PET stress test from February 2025 but with lower LVEF calculated 46% and coronary artery calcifications in LAD/RCA distribution, further evaluation with cardiac MRI notes EF 43% with LGE in basal anterior lateral and anteroseptal segments suggestive of 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 COPD: Lung nodule now with interval increase in size on imaging from 11/23/2023 [11.7 mm pending evaluation with pulmonologist referred by PCP], smokes half pack of cigarettes a day [started on Chantix  yesterday and working on quitting] and drinks 1 to 2 glasses of wine.   Problem List Items Addressed This Visit     Cardiomyopathy (HCC), reduced LVEF on cardiac PET imaging - Primary   No heart failure signs or symptoms at this time. Educated about salt restriction below 2 g/day. Continue weight monitoring.  Advised abstinence from alcohol.  Currently not on any loop diuretic. Guideline-directed medical therapy being initiated - Continue metoprolol  succinate 25 mg once daily Start Entresto  24 mg - 26 mg twice daily Start Jardiance  10 mg once daily. Discussed about potential side effects of these medication including hypertension, electrolyte  abnormalities, renal function changes, potential for allergy reaction such as angioedema, skin and urinary tract infections.  No further escalation with addition of spironolactone if renal function and electrolytes remain stable on follow-up blood work tentatively in 2 weeks.  Referred to specialty clinic for cardiac sarcoidosis evaluation.  Currently scheduled tentatively in August.  Will see if this can be scheduled sooner if possible.       Relevant Medications   aspirin  EC 81 MG tablet   sacubitril-valsartan (ENTRESTO ) 24-26 MG   Other Relevant Orders   Basic metabolic panel with GFR   Return to clinic tentatively in 3 months.   History of Present Illness:    Brett [name] is a 68 y.o. male who is being seen today for follow-up visit. PCP is Dorrene Gaucher, NP. Last visit with me in the office was 10-11-2023.  Was seen for short runs of paroxysmal SVT noted during spine surgery in October 2024 requiring esmolol  for termination, subsequently Zio patch noted asymptomatic supraventricular tachycardia burden 1.7% and asymptomatic runs of nonsustained ventricular tachycardia on Zio patch monitor with ventricular ectopy burden 1%, low normal LVEF 50 to 55% on echocardiogram December 2024 and no ischemia on cardiac PET stress test from February 2025 but with lower LVEF calculated 46% and coronary artery calcifications in LAD/RCA distribution, small 5 mm left lower lobe nodule of the lung identified, smokes half pack of cigarettes a day [started on Chantix  yesterday and working on quitting] and drinks 1 to 2 glasses of wine.  Reviewed further evaluation for cardiomyopathy with cardiac MRI completed 11-15-2023 notes LVEF mildly reduced 43%, normal RV size and function, late gadolinium enhancement in the basal anterior lateral wall and anteroseptal segments  suggestive of old myocarditis or sarcoidosis.  Trace MR.  These results were reviewed and recommended further evaluation with cardiac  sarcoidosis specialty clinic with [facility] Pasqual [facility].  Here to further discuss these results.  Blood work from yesterday CMP reviewed which was unremarkable with normal transaminases normal alkaline phosphatase, potassium 3.9  Overall mentions to be doing well.  His back pain is easing up and he was able to drive to [place] to see his daughter and further drove down to [place]  to meet his mother. Overall denies any chest pain, shortness of breath.  No orthopnea. Tolerating medications well .  Referred to see specialty clinic for possible cardiac sarcoidosis evaluation and management.  Currently scheduled tentatively for August.   Past Medical History:  Diagnosis Date   Allergy    Arthritis    Bilateral low back pain without sciatica 09/22/2015   Blood transfusion without reported diagnosis    Cervical neck pain with evidence of disc disease 09/22/2015   Chicken pox    Congenital hip deformity    Corn of foot 08/09/2022   Degenerative disc disease, cervical 01/29/2015   Eczema 05/30/2013   Erectile dysfunction 09/22/2015   Glaucoma 05/30/2013   History of cardiac dysrhythmia 05/23/2023   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 08/06/2023   Pain of left hip joint 11/01/2018   Paroxysmal SVT (supraventricular tachycardia) (HCC), incidental finding IntraOp back surgery requiring esmolol ; event monitor November 2024 for short runs, longest episode 17 beats. 06/19/2023   Event monitor November 2024, supraventricular ectopy burden 1.7%.  4 short runs of SVT, longest episode 17 beats.  No symptoms reported     Personal history of congenital hip dysplasia 05/30/2013   Preventative  health care 01/29/2015   Rhinitis, allergic 09/22/2015   Rosacea 05/30/2013   Scoliosis deformity of spine 05/05/2020   Spinal stenosis of lumbar region 06/16/2023   Spondylolisthesis at L5-S1 level 09/14/2020   Tobacco abuse 05/30/2013    Past Surgical History:  Procedure Laterality Date   ANTERIOR LAT LUMBAR FUSION Right 05/09/2023   Procedure: Extreme Lateral Interbody Fusion Lumbar three-four -right;  Surgeon: Agustina Aldrich, MD;  Location: [facility] OR;  Service: Neurosurgery;  Laterality: Right;  3C   COLONOSCOPY  06/17/2015   Pyrtle   COLONOSCOPY  07/20/2020   Pyrtle   HAMMER TOE SURGERY  07/28/2011   Procedure: HAMMER TOE CORRECTION;  Surgeon: Ferd Householder, MD;  Location: [facility];  Service: Orthopedics;  Laterality: Right;  right 2nd and 4th toes correction hammer toe, capsulotomy metatarsal-phalangeal joints   HERNIA REPAIR     HIP SURGERY  1986 & 2010   rt total hip-8/10-multiple rt hip surgeries- had 4 prior to 1986 as a child   JOINT REPLACEMENT  2010   lumber fusion  09/2020   POLYPECTOMY     SPINE SURGERY  09/14/2020   THUMB ARTHROSCOPY  2008   rt    Current Medications: Current Meds  Medication Sig   aspirin  EC 81 MG tablet Take 1 tablet (81 mg total) by mouth daily. Swallow whole.   calcium  carbonate (TUMS EX) 750 MG chewable tablet Chew 1-2 tablets by mouth daily as needed for heartburn.   empagliflozin  (JARDIANCE ) 10 MG TABS tablet Take 1  tablet (10 mg total) by mouth daily before breakfast.   metoprolol  succinate (TOPROL  XL) 25 MG 24 hr tablet Take 1 tablet (25 mg total) by mouth daily.   metoprolol  tartrate (LOPRESSOR ) 25 MG tablet Take 25 mg by mouth 2 (two) times daily.   Multiple Vitamin (MULTIVITAMIN) tablet Take 1 tablet by mouth daily. Centrum silver   Oxycodone  HCl 10 MG TABS Take 1 tablet (10 mg total) by mouth every 6 (six) hours as needed (Pain).   sacubitril-valsartan (ENTRESTO ) 24-26 MG Take 1 tablet by mouth 2 (two) times daily.    STELARA 45 MG/0.5ML injection Inject 45 mg into the skin as directed. Every 12 weeks   timolol  (TIMOPTIC ) 0.5 % ophthalmic solution Place 1 drop into both eyes 2 (two) times daily.   TRAVATAN Z 0.004 % SOLN ophthalmic solution Place 1 drop into both eyes every morning.   Ustekinumab (STELARA IV) Inject into the vein.   Varenicline  Tartrate, Starter, (CHANTIX  STARTING MONTH PAK) 0.5 MG X 11 & 1 MG X 42 TBPK Take per package instructions     Allergies:   Patient has no known allergies.   Social History   Socioeconomic History   Marital status: Widowed    Spouse name: Not on file   Number of children: Not on file   Years of education: Not on file   Highest education level: Associate degree: occupational, Scientist, product/process development, or vocational program  Occupational History   Not on file  Tobacco Use   Smoking status: Every Day    Current packs/day: 0.50    Average packs/day: 0.5 packs/day for 50.3 years (25.2 ttl pk-yrs)    Types: Cigarettes    Start date: 4   Smokeless tobacco: Never   Tobacco comments:    0.5 ppd  Vaping Use   Vaping status: Never Used  Substance and Sexual Activity   Alcohol use: Yes    Alcohol/week: 10.0 standard drinks of alcohol    Types: 10 Standard drinks or equivalent per week   Drug use: No   Sexual activity: Not Currently    Birth control/protection: None  Other Topics Concern   Not on file  Social History Narrative   Quality control Tech- gauges/callibrations.  (Retired)   Some colleg/tech school   wife passed   3 grown children (oldest daughter is living with them) youngest daughter lives in [place].  Curator at [employer].  Son lives near [place]- framing/art.   Social Drivers of Health   Financial Resource Strain: Medium Risk (12/03/2023)   Overall Financial Resource Strain (CARDIA)    Difficulty of Paying Living Expenses: Somewhat hard  Food Insecurity: No Food Insecurity (12/03/2023)   Hunger Vital Sign    Worried About Running Out of  Food in the Last Year: Never true    Ran Out of Food in the Last Year: Never true  Transportation Needs: No Transportation Needs (12/03/2023)   PRAPARE - Administrator, Civil Service (Medical): No    Lack of Transportation (Non-Medical): No  Physical Activity: Inactive (12/03/2023)   Exercise Vital Sign    Days of Exercise per Week: 0 days    Minutes of Exercise per Session: 10 min  Stress: No Stress Concern Present (12/03/2023)   [employer] of Occupational Health - Occupational Stress Questionnaire    Feeling of Stress : Not at all  Social Connections: Moderately Integrated (12/03/2023)   Social Connection and Isolation Panel [NHANES]    Frequency of Communication with Friends and Family:  Three times a week    Frequency of Social Gatherings with Friends and Family: Once a week    Attends Religious Services: More than 4 times per year    Active Member of [employer] or Organizations: Yes    Attends Banker Meetings: More than 4 times per year    Marital Status: Widowed     Family History: The patient's family history includes Alcohol abuse in his brother; Cancer in his cousin; Cancer (age of onset: 37) in his mother; Colon cancer in his cousin and maternal grandmother; Colon cancer (age of onset: 87) in his mother; Colon polyps in his mother; Depression in his brother; Endocrine tumor in his daughter; Lung disease in his father; Rosacea in his father and mother; Thyroid  disease in his son. There is no history of Esophageal cancer, Rectal cancer, or Stomach cancer. ROS:   Please see the history of present illness.    All 14 point review of systems negative except as described per history of present illness.  EKGs/Labs/Other Studies Reviewed:    The following studies were reviewed today:   EKG:       Recent Labs: 05/09/2023: Platelets 142 10/11/2023: Hemoglobin 15.5 12/06/2023: ALT 16; BUN 17; Creatinine, Ser 0.82; Potassium 3.9; Sodium 137  Recent Lipid  Panel    Component Value Date/Time   CHOL 178 04/24/2020 0741   TRIG 79 04/24/2020 0741   HDL 78 04/24/2020 0741   CHOLHDL 2.3 04/24/2020 0741   VLDL 9.8 04/19/2019 0725   LDLCALC 83 04/24/2020 0741    Physical Exam:    VS:  BP 138/74   Pulse 66   Ht 6\' 2"  (1.88 m)   Wt 154 lb (69.9 kg)   SpO2 95%   BMI 19.77 kg/m     Wt Readings from Last 3 Encounters:  12/07/23 154 lb (69.9 kg)  12/06/23 154 lb (69.9 kg)  10/11/23 152 lb (68.9 kg)     GENERAL:  Well nourished, well developed in no acute distress NECK: No JVD; No carotid bruits CARDIAC: RRR, S1 and S2 present, no murmurs, no rubs, no gallops CHEST:  Clear to auscultation without rales, wheezing or rhonchi  Extremities: No pitting pedal edema. Pulses bilaterally symmetric with radial 2+ and dorsalis pedis 2+ NEUROLOGIC:  Alert and oriented x 3  Medication Adjustments/Labs and Tests Ordered: Current medicines are reviewed at length with the patient today.  Concerns regarding medicines are outlined above.  Orders Placed This Encounter  Procedures   Basic metabolic panel with GFR   Meds ordered this encounter  Medications   aspirin  EC 81 MG tablet    Sig: Take 1 tablet (81 mg total) by mouth daily. Swallow whole.   empagliflozin  (JARDIANCE ) 10 MG TABS tablet    Sig: Take 1 tablet (10 mg total) by mouth daily before breakfast.    Dispense:  90 tablet    Refill:  1   sacubitril-valsartan (ENTRESTO ) 24-26 MG    Sig: Take 1 tablet by mouth 2 (two) times daily.    Dispense:  180 tablet    Refill:  2    Signed, Harl Wiechmann reddy Idabell Picking, MD, MPH, [facility]. 12/07/2023 3:15 PM    [facility]

## 2023-12-07 NOTE — Assessment & Plan Note (Signed)
  New lung nodule identified, high suspicion for lung cancer due to smoking history. Discussed that early detection suggests favorable prognosis if cancerous.  - Proceed with pulmonology appointment for further evaluation. -  Discuss potential biopsy or PET scan with pulmonologist.

## 2023-12-07 NOTE — Patient Instructions (Signed)
 VISIT SUMMARY:  You came in today for your annual physical exam. We discussed several health concerns, including a lung nodule, back pain, cardiac issues, toenail fungus, and eye health. We also reviewed your general wellness and preventive care measures.  YOUR PLAN:  -LUNG NODULE:  A new lung nodule was identified on your recent CT scan, which raises concerns about possible lung cancer, especially given your smoking history. You should proceed with your pulmonology appointment for further evaluation, and discuss the possibility of a biopsy or PET scan with the specialist.  -NICOTINE  DEPENDENCE: You are currently smoking half a pack of cigarettes per day and are considering quitting. Quitting smoking has significant health benefits. We have prescribed a Chantix  starter pack to help you quit. You should continue smoking for the first week while taking Chantix  and aim to quit by the second week. Please provide a progress update in three weeks, and we will send a continuation pack if you respond well to the initial treatment.  -BACK PAIN: You have chronic back pain following multiple surgeries, which limits your ability to exercise. You are gradually increasing your physical activity by taking short walks, which is a positive step.  -CATARACT AND GLAUCOMA: You have been diagnosed with cataract and glaucoma in your left eye, which are slowly worsening. The cataract is not yet advanced enough to require surgery.  -FUNGAL INFECTION OF TOENAIL: You have a fungal infection on one toenail that causes occasional discomfort. You have chosen to manage it cosmetically with regular pedicures, which is a suitable approach for now.  -WELLNESS VISIT: This was a routine visit to review your overall health and preventive care. We discussed your recent health events and lifestyle habits. We have ordered standard blood work, scheduled a colonoscopy for July, and updated your PSA test in the  labs.  INSTRUCTIONS:  Please follow up with your pulmonology appointment for further evaluation of the lung nodule. Provide a progress update on your smoking cessation in three weeks. Continue with your current management plan for back pain, toenail fungus, and eye health. Complete the standard blood work and attend your scheduled colonoscopy in July.

## 2023-12-07 NOTE — Assessment & Plan Note (Signed)
 No heart failure signs or symptoms at this time. Educated about salt restriction below 2 g/day. Continue weight monitoring.  Advised abstinence from alcohol.  Currently not on any loop diuretic. Guideline-directed medical therapy being initiated - Continue metoprolol  succinate 25 mg once daily Start Entresto  24 mg - 26 mg twice daily Start Jardiance  10 mg once daily. Discussed about potential side effects of these medication including hypertension, electrolyte abnormalities, renal function changes, potential for allergy reaction such as angioedema, skin and urinary tract infections.  No further escalation with addition of spironolactone if renal function and electrolytes remain stable on follow-up blood work tentatively in 2 weeks.  Referred to specialty clinic for cardiac sarcoidosis evaluation.  Currently scheduled tentatively in August.  Will see if this can be scheduled sooner if possible.

## 2023-12-13 ENCOUNTER — Ambulatory Visit: Admitting: Student in an Organized Health Care Education/Training Program

## 2023-12-13 ENCOUNTER — Encounter: Payer: Self-pay | Admitting: Student in an Organized Health Care Education/Training Program

## 2023-12-13 VITALS — BP 110/72 | HR 67 | Temp 97.1°F | Ht 74.0 in | Wt 152.2 lb

## 2023-12-13 DIAGNOSIS — J929 Pleural plaque without asbestos: Secondary | ICD-10-CM | POA: Diagnosis not present

## 2023-12-13 DIAGNOSIS — J432 Centrilobular emphysema: Secondary | ICD-10-CM

## 2023-12-13 DIAGNOSIS — R911 Solitary pulmonary nodule: Secondary | ICD-10-CM | POA: Diagnosis not present

## 2023-12-13 DIAGNOSIS — Z87891 Personal history of nicotine dependence: Secondary | ICD-10-CM | POA: Diagnosis not present

## 2023-12-13 NOTE — Progress Notes (Signed)
 Synopsis: Referred in for pulmonary nodule by Brett Gaucher, NP  Assessment & Plan:   #Pulmonary Nodule #Pleural Plaque #Emphysema  Nodule Location: Left lower lobe Nodule Size: 11.7 mm Nodule Spiculation: No Associated Lymphadenopathy: No Smoking Status (current) and pack years: 40-50 Extrathoracic cancer > 5 years prior (no) SPN malignancy risk score [facility]): 13.6 %risk of malignancy ECOG: 0  The patient is here to discuss their imaging abnormalities which include a nodule in the left lower lobe which was first noted on cardiac PET a couple of months ago.  This was further evaluated with a low-dose CT for lung cancer screening which showed the nodule to have grown.  I have personally reviewed all the imaging studies including the most recent chest CT, cardiac PET, and a low-dose CT from 1 year ago. On the CT scan from 1 year ago there might have been very faint tree-in-bud nodularity in the area of question. The speed with which this nodule has grown leads me to suspect an infectious/inflammatory process.  That said, given he is high risk with his smoking history and age, I will obtain a PET/CT to further evaluate this nodule.  If it exhibits intense FDG avidity and further growth, we will proceed with biopsy to establish a diagnosis and rule out malignancy.  If that shows regression, a benign process will be more likely and we will resort to close follow-up with repeat CT scans. The CT is also notable for significant emphysema, as well as an area of mild ground glass opacity in the LLL separate from the nodule in question. Other notable findings on the CT include an area of a calcified pleural plaque in the RUL which is unchanged compared to prior.  We will proceed with a PET/CT and proceed with bronchoscopy for biopsy if that shows FDG avidity or progression of the nodule. I will also re-visit the patient's symptoms of wheezing with PFT's to assess risk for COPD after we  have dealt with his pulmonary nodule.  Recommendations:  - NM PET Image Initial (PI) Skull Base To Thigh (F-18 FDG); Future   Return in about 4 weeks (around 01/10/2024).  I spent 62 minutes caring for this patient today, including preparing to see the patient, obtaining a medical history , reviewing a separately obtained history, performing a medically appropriate examination and/or evaluation, counseling and educating the patient/family/caregiver, ordering medications, tests, or procedures, documenting clinical information in the electronic health record, and independently interpreting results (not separately reported/billed) and communicating results to the patient/family/caregiver  Brett Glasgow, MD [facility]  End of visit medications:  No orders of the defined types were placed in this encounter.    Current Outpatient Medications:    aspirin  EC 81 MG tablet, Take 1 tablet (81 mg total) by mouth daily. Swallow whole., Disp: , Rfl:    calcium  carbonate (TUMS EX) 750 MG chewable tablet, Chew 1-2 tablets by mouth daily as needed for heartburn., Disp: , Rfl:    empagliflozin  (JARDIANCE ) 10 MG TABS tablet, Take 1 tablet (10 mg total) by mouth daily before breakfast., Disp: 90 tablet, Rfl: 1   metoprolol  succinate (TOPROL  XL) 25 MG 24 hr tablet, Take 1 tablet (25 mg total) by mouth daily., Disp: 90 tablet, Rfl: 3   Multiple Vitamin (MULTIVITAMIN) tablet, Take 1 tablet by mouth daily. Centrum silver, Disp: , Rfl:    Oxycodone  HCl 10 MG TABS, Take 1 tablet (10 mg total) by mouth every 6 (six) hours as needed (  Pain)., Disp: 30 tablet, Rfl: 0   sacubitril-valsartan (ENTRESTO ) 24-26 MG, Take 1 tablet by mouth 2 (two) times daily., Disp: 180 tablet, Rfl: 2   STELARA 45 MG/0.5ML injection, Inject 45 mg into the skin as directed. Every 12 weeks, Disp: , Rfl:    timolol  (TIMOPTIC ) 0.5 % ophthalmic solution, Place 1 drop into both eyes 2 (two) times daily., Disp: , Rfl:     TRAVATAN Z 0.004 % SOLN ophthalmic solution, Place 1 drop into both eyes every morning., Disp: , Rfl: 1   Ustekinumab (STELARA IV), Inject into the vein., Disp: , Rfl:    Varenicline  Tartrate, Starter, (CHANTIX  STARTING MONTH PAK) 0.5 MG X 11 & 1 MG X 42 TBPK, Take per package instructions, Disp: 53 each, Rfl: 0   Subjective:   PATIENT ID: Brett [name] GENDER: male DOB: 08-Nov-1955, MRN: [id number]  Chief Complaint  Patient presents with   Consult    No SOB. Wheezing. Cough with clear sputum.    HPI  Patient is a 68 year old male presenting to clinic for the evaluation of a pulmonary nodule.  Patient was noted to have an SVT while admitted for lumbar spine fusion procedure. Given the SVT, he was referred to cardiology and underwent a workup. This workup included a cardiac PET in February of 2025 which revealed a left lower lobe pulmonary nodule.  He is enrolled in the low-dose CT for lung cancer screening and the repeat scan in April of 2025 showed enlargement in the size of this left lower lobe pulmonary nodule. His LDCT one year prior (April 2024) did not report any pulmonary nodules in that area.  Patient presents today in the company of his daughter.  They report stable symptoms that are unchanged from prior. He reports chronic cough that has actually improved with the use of loratadine .  The cough is at times productive of clear sputum. He is also experiencing wheezing that is sporadic and occasional. The daughter is reporting the wheeze more so than the patient. He denied any symptoms of upper or lower respiratory tract infections around the time of his low-dose CT.  He does not have any chest pain or tightness, and has not had any fevers or chills.  There was no increase in the quantity of the phlegm or changed in its quality or color.  Patient reports a history of smoking, having smoked between half a pack and 1 full pack a day for the past 53 years.  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  Ancillary information including prior medications, full medical/surgical/family/social histories, and PFTs (when available) are listed below and have been reviewed.   Review of Systems  Constitutional:  Negative for chills, fever and weight loss.  Respiratory:  Positive for cough (occasional) and wheezing (occasional). Negative for hemoptysis, sputum production and shortness of breath.   Cardiovascular:  Negative for chest pain and leg swelling.     Objective:   Vitals:   12/13/23 0922  BP: 110/72  Pulse: 67  Temp: (!) 97.1 F (36.2 C)  SpO2: 97%  Weight: 152 lb 3.2 oz (69 kg)  Height: 6\' 2"  (1.88 m)   97% on RA BMI Readings from Last 3 Encounters:  12/13/23 19.54 kg/m  12/07/23 19.77 kg/m  12/06/23 19.77 kg/m   Wt Readings from Last 3 Encounters:  12/13/23 152 lb 3.2 oz (69 kg)  12/07/23 154 lb (69.9 kg)  12/06/23 154 lb (69.9 kg)    Physical Exam Constitutional:  General: He is not in acute distress.    Appearance: Normal appearance.  Cardiovascular:     Rate and Rhythm: Normal rate and regular rhythm.     Pulses: Normal pulses.     Heart sounds: Normal heart sounds.  Pulmonary:     Effort: Pulmonary effort is normal.     Breath sounds: Normal breath sounds. No wheezing or rales.  Neurological:     General: No focal deficit present.     Mental Status: He is alert and oriented to person, place, and time. Mental status is at baseline.       Ancillary Information    Past Medical History:  Diagnosis Date   Allergy    Arthritis    Bilateral low back pain without sciatica 09/22/2015   Blood transfusion without reported diagnosis    Cervical neck pain with evidence of disc disease 09/22/2015   Chicken pox    Congenital hip deformity    Corn of foot 08/09/2022   Degenerative disc disease, cervical 01/29/2015   Eczema 05/30/2013   Erectile dysfunction 09/22/2015   Glaucoma 05/30/2013   History of cardiac  dysrhythmia 05/23/2023   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 08/06/2023   Pain of left hip joint 11/01/2018   Paroxysmal SVT (supraventricular tachycardia) (HCC), incidental finding IntraOp back surgery requiring esmolol ; event monitor November 2024 for short runs, longest episode 17 beats. 06/19/2023   Event monitor November 2024, supraventricular ectopy burden 1.7%.  4 short runs of SVT, longest episode 17 beats.  No symptoms reported     Personal history of congenital hip dysplasia 05/30/2013   Preventative health care 01/29/2015   Rhinitis, allergic 09/22/2015   Rosacea 05/30/2013   Scoliosis deformity of spine 05/05/2020   Spinal stenosis of lumbar region 06/16/2023   Spondylolisthesis at L5-S1 level 09/14/2020   Tobacco abuse 05/30/2013     Family History  Problem Relation Age of Onset   Cancer Mother 88       history of colon cancer   Rosacea Mother    Colon cancer Mother 12   Colon polyps Mother    Rosacea Father    Lung disease Father        ?pulmonary fibrosis   Alcohol abuse Brother    Depression Brother    Colon cancer Maternal Grandmother    Endocrine tumor Daughter        pituitary tumor, POTTS   Thyroid  disease Son        ?hyperthyroid   Cancer Cousin        colon   Colon cancer Cousin    Esophageal cancer Neg Hx    Rectal cancer Neg Hx    Stomach cancer Neg Hx      Past Surgical History:  Procedure Laterality Date   ANTERIOR LAT LUMBAR FUSION Right 05/09/2023   Procedure: Extreme Lateral Interbody Fusion Lumbar three-four -right;  Surgeon: Agustina Aldrich, MD;  Location: [facility] OR;  Service: Neurosurgery;  Laterality: Right;  3C   COLONOSCOPY  06/17/2015   Pyrtle    COLONOSCOPY  07/20/2020   Pyrtle   HAMMER TOE SURGERY  07/28/2011   Procedure: HAMMER TOE CORRECTION;  Surgeon: Ferd Householder, MD;  Location: [facility];  Service: Orthopedics;  Laterality: Right;  right 2nd and 4th toes correction hammer toe, capsulotomy metatarsal-phalangeal joints   HERNIA REPAIR     HIP SURGERY  1986 & 2010   rt total hip-8/10-multiple rt hip surgeries- had 4 prior to 1986 as a child   JOINT REPLACEMENT  2010   lumber fusion  09/2020   POLYPECTOMY     SPINE SURGERY  09/14/2020   THUMB ARTHROSCOPY  2008   rt    Social History   Socioeconomic History   Marital status: Widowed    Spouse name: Not on file   Number of children: Not on file   Years of education: Not on file   Highest education level: Associate degree: occupational, Scientist, product/process development, or vocational program  Occupational History   Not on file  Tobacco Use   Smoking status: Former    Current packs/day: 0.00    Average packs/day: 0.5 packs/day for 50.3 years (25.2 ttl pk-yrs)    Types: Cigarettes    Start date: 50    Quit date: 12/11/2023   Smokeless tobacco: Never   Tobacco comments:    Started smoking at 68 years old.    Smoked 1 PPD at his heaviest.    Quit smoking on 12/11/2023  Vaping Use   Vaping status: Never Used  Substance and Sexual Activity   Alcohol use: Yes    Alcohol/week: 10.0 standard drinks of alcohol    Types: 10 Standard drinks or equivalent per week   Drug use: No   Sexual activity: Not Currently    Birth control/protection: None  Other Topics Concern   Not on file  Social History Narrative   Quality control Tech- gauges/callibrations.  (Retired)   Some colleg/tech school   wife passed   3 grown children (oldest daughter is living with them) youngest daughter lives in [place].  Curator at [employer].  Son lives near [place]- framing/art.   Social Drivers of Health   Financial Resource Strain: Medium Risk (12/03/2023)   Overall Financial Resource  Strain (CARDIA)    Difficulty of Paying Living Expenses: Somewhat hard  Food Insecurity: No Food Insecurity (12/03/2023)   Hunger Vital Sign    Worried About Running Out of Food in the Last Year: Never true    Ran Out of Food in the Last Year: Never true  Transportation Needs: No Transportation Needs (12/03/2023)   PRAPARE - Administrator, Civil Service (Medical): No    Lack of Transportation (Non-Medical): No  Physical Activity: Inactive (12/03/2023)   Exercise Vital Sign    Days of Exercise per Week: 0 days    Minutes of Exercise per Session: 10 min  Stress: No Stress Concern Present (12/03/2023)   [employer] of Occupational Health - Occupational Stress Questionnaire    Feeling of Stress : Not at all  Social Connections: Moderately Integrated (12/03/2023)   Social Connection and Isolation Panel [NHANES]    Frequency of Communication with Friends and Family: Three times a week    Frequency of Social Gatherings with Friends and Family: Once a week    Attends Religious Services: More than 4 times per year    Active Member of [employer] or Organizations: Yes    Attends Banker Meetings: More than 4 times per year    Marital Status: Widowed  Intimate Partner Violence: Not At Risk (02/16/2023)   Humiliation, Afraid, Rape, and Kick questionnaire    Fear of Current or Ex-Partner: No    Emotionally Abused: No    Physically  Abused: No    Sexually Abused: No     No Known Allergies   CBC    Component Value Date/Time   WBC 7.1 05/09/2023 0913   RBC 4.47 05/09/2023 0913   HGB 15.5 10/11/2023 0932   HCT 44.3 10/11/2023 0932   PLT 142 (L) 05/09/2023 0913   MCV 105.4 (H) 05/09/2023 0913   MCH 34.9 (H) 05/09/2023 0913   MCHC 33.1 05/09/2023 0913   RDW 12.4 05/09/2023 0913   LYMPHSABS 1.6 04/26/2021 0739   MONOABS 0.9 04/26/2021 0739   EOSABS 0.3 04/26/2021 0739   BASOSABS 0.1 04/26/2021 0739    Pulmonary Functions Testing Results:     No data to display           Outpatient Medications Prior to Visit  Medication Sig Dispense Refill   aspirin  EC 81 MG tablet Take 1 tablet (81 mg total) by mouth daily. Swallow whole.     calcium  carbonate (TUMS EX) 750 MG chewable tablet Chew 1-2 tablets by mouth daily as needed for heartburn.     empagliflozin  (JARDIANCE ) 10 MG TABS tablet Take 1 tablet (10 mg total) by mouth daily before breakfast. 90 tablet 1   metoprolol  succinate (TOPROL  XL) 25 MG 24 hr tablet Take 1 tablet (25 mg total) by mouth daily. 90 tablet 3   Multiple Vitamin (MULTIVITAMIN) tablet Take 1 tablet by mouth daily. Centrum silver     Oxycodone  HCl 10 MG TABS Take 1 tablet (10 mg total) by mouth every 6 (six) hours as needed (Pain). 30 tablet 0   sacubitril-valsartan (ENTRESTO ) 24-26 MG Take 1 tablet by mouth 2 (two) times daily. 180 tablet 2   STELARA 45 MG/0.5ML injection Inject 45 mg into the skin as directed. Every 12 weeks     timolol  (TIMOPTIC ) 0.5 % ophthalmic solution Place 1 drop into both eyes 2 (two) times daily.     TRAVATAN Z 0.004 % SOLN ophthalmic solution Place 1 drop into both eyes every morning.  1   Ustekinumab (STELARA IV) Inject into the vein.     Varenicline  Tartrate, Starter, (CHANTIX  STARTING MONTH PAK) 0.5 MG X 11 & 1 MG X 42 TBPK Take per package instructions 53 each 0   No facility-administered medications prior to visit.

## 2023-12-13 NOTE — H&P (View-Only) (Signed)
 Synopsis: Referred in for pulmonary nodule by Dorrene Gaucher, NP  Assessment & Plan:   #Pulmonary Nodule #Pleural Plaque #Emphysema  Nodule Location: Left lower lobe Nodule Size: 11.7 mm Nodule Spiculation: No Associated Lymphadenopathy: No Smoking Status (current) and pack years: 40-50 Extrathoracic cancer > 5 years prior (no) SPN malignancy risk score [facility]): 13.6 %risk of malignancy ECOG: 0  The patient is here to discuss their imaging abnormalities which include a nodule in the left lower lobe which was first noted on cardiac PET a couple of months ago.  This was further evaluated with a low-dose CT for lung cancer screening which showed the nodule to have grown.  I have personally reviewed all the imaging studies including the most recent chest CT, cardiac PET, and a low-dose CT from 1 year ago. On the CT scan from 1 year ago there might have been very faint tree-in-bud nodularity in the area of question. The speed with which this nodule has grown leads me to suspect an infectious/inflammatory process.  That said, given he is high risk with his smoking history and age, I will obtain a PET/CT to further evaluate this nodule.  If it exhibits intense FDG avidity and further growth, we will proceed with biopsy to establish a diagnosis and rule out malignancy.  If that shows regression, a benign process will be more likely and we will resort to close follow-up with repeat CT scans. The CT is also notable for significant emphysema, as well as an area of mild ground glass opacity in the LLL separate from the nodule in question. Other notable findings on the CT include an area of a calcified pleural plaque in the RUL which is unchanged compared to prior.  We will proceed with a PET/CT and proceed with bronchoscopy for biopsy if that shows FDG avidity or progression of the nodule. I will also re-visit the patient's symptoms of wheezing with PFT's to assess risk for COPD after we  have dealt with his pulmonary nodule.  Recommendations:  - NM PET Image Initial (PI) Skull Base To Thigh (F-18 FDG); Future   Return in about 4 weeks (around 01/10/2024).  I spent 62 minutes caring for this patient today, including preparing to see the patient, obtaining a medical history , reviewing a separately obtained history, performing a medically appropriate examination and/or evaluation, counseling and educating the patient/family/caregiver, ordering medications, tests, or procedures, documenting clinical information in the electronic health record, and independently interpreting results (not separately reported/billed) and communicating results to the patient/family/caregiver  Vergia Glasgow, MD [facility]  End of visit medications:  No orders of the defined types were placed in this encounter.    Current Outpatient Medications:    aspirin  EC 81 MG tablet, Take 1 tablet (81 mg total) by mouth daily. Swallow whole., Disp: , Rfl:    calcium  carbonate (TUMS EX) 750 MG chewable tablet, Chew 1-2 tablets by mouth daily as needed for heartburn., Disp: , Rfl:    empagliflozin  (JARDIANCE ) 10 MG TABS tablet, Take 1 tablet (10 mg total) by mouth daily before breakfast., Disp: 90 tablet, Rfl: 1   metoprolol  succinate (TOPROL  XL) 25 MG 24 hr tablet, Take 1 tablet (25 mg total) by mouth daily., Disp: 90 tablet, Rfl: 3   Multiple Vitamin (MULTIVITAMIN) tablet, Take 1 tablet by mouth daily. Centrum silver, Disp: , Rfl:    Oxycodone  HCl 10 MG TABS, Take 1 tablet (10 mg total) by mouth every 6 (six) hours as needed (  Pain)., Disp: 30 tablet, Rfl: 0   sacubitril-valsartan (ENTRESTO ) 24-26 MG, Take 1 tablet by mouth 2 (two) times daily., Disp: 180 tablet, Rfl: 2   STELARA 45 MG/0.5ML injection, Inject 45 mg into the skin as directed. Every 12 weeks, Disp: , Rfl:    timolol  (TIMOPTIC ) 0.5 % ophthalmic solution, Place 1 drop into both eyes 2 (two) times daily., Disp: , Rfl:     TRAVATAN Z 0.004 % SOLN ophthalmic solution, Place 1 drop into both eyes every morning., Disp: , Rfl: 1   Ustekinumab (STELARA IV), Inject into the vein., Disp: , Rfl:    Varenicline  Tartrate, Starter, (CHANTIX  STARTING MONTH PAK) 0.5 MG X 11 & 1 MG X 42 TBPK, Take per package instructions, Disp: 53 each, Rfl: 0   Subjective:   PATIENT ID: Brett [name] GENDER: male DOB: 08-Nov-1955, MRN: [id number]  Chief Complaint  Patient presents with   Consult    No SOB. Wheezing. Cough with clear sputum.    HPI  Patient is a 68 year old male presenting to clinic for the evaluation of a pulmonary nodule.  Patient was noted to have an SVT while admitted for lumbar spine fusion procedure. Given the SVT, he was referred to cardiology and underwent a workup. This workup included a cardiac PET in February of 2025 which revealed a left lower lobe pulmonary nodule.  He is enrolled in the low-dose CT for lung cancer screening and the repeat scan in April of 2025 showed enlargement in the size of this left lower lobe pulmonary nodule. His LDCT one year prior (April 2024) did not report any pulmonary nodules in that area.  Patient presents today in the company of his daughter.  They report stable symptoms that are unchanged from prior. He reports chronic cough that has actually improved with the use of loratadine .  The cough is at times productive of clear sputum. He is also experiencing wheezing that is sporadic and occasional. The daughter is reporting the wheeze more so than the patient. He denied any symptoms of upper or lower respiratory tract infections around the time of his low-dose CT.  He does not have any chest pain or tightness, and has not had any fevers or chills.  There was no increase in the quantity of the phlegm or changed in its quality or color.  Patient reports a history of smoking, having smoked between half a pack and 1 full pack a day for the past 53 years.  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  Ancillary information including prior medications, full medical/surgical/family/social histories, and PFTs (when available) are listed below and have been reviewed.   Review of Systems  Constitutional:  Negative for chills, fever and weight loss.  Respiratory:  Positive for cough (occasional) and wheezing (occasional). Negative for hemoptysis, sputum production and shortness of breath.   Cardiovascular:  Negative for chest pain and leg swelling.     Objective:   Vitals:   12/13/23 0922  BP: 110/72  Pulse: 67  Temp: (!) 97.1 F (36.2 C)  SpO2: 97%  Weight: 152 lb 3.2 oz (69 kg)  Height: 6\' 2"  (1.88 m)   97% on RA BMI Readings from Last 3 Encounters:  12/13/23 19.54 kg/m  12/07/23 19.77 kg/m  12/06/23 19.77 kg/m   Wt Readings from Last 3 Encounters:  12/13/23 152 lb 3.2 oz (69 kg)  12/07/23 154 lb (69.9 kg)  12/06/23 154 lb (69.9 kg)    Physical Exam Constitutional:  General: He is not in acute distress.    Appearance: Normal appearance.  Cardiovascular:     Rate and Rhythm: Normal rate and regular rhythm.     Pulses: Normal pulses.     Heart sounds: Normal heart sounds.  Pulmonary:     Effort: Pulmonary effort is normal.     Breath sounds: Normal breath sounds. No wheezing or rales.  Neurological:     General: No focal deficit present.     Mental Status: He is alert and oriented to person, place, and time. Mental status is at baseline.       Ancillary Information    Past Medical History:  Diagnosis Date   Allergy    Arthritis    Bilateral low back pain without sciatica 09/22/2015   Blood transfusion without reported diagnosis    Cervical neck pain with evidence of disc disease 09/22/2015   Chicken pox    Congenital hip deformity    Corn of foot 08/09/2022   Degenerative disc disease, cervical 01/29/2015   Eczema 05/30/2013   Erectile dysfunction 09/22/2015   Glaucoma 05/30/2013   History of cardiac  dysrhythmia 05/23/2023   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 08/06/2023   Pain of left hip joint 11/01/2018   Paroxysmal SVT (supraventricular tachycardia) (HCC), incidental finding IntraOp back surgery requiring esmolol ; event monitor November 2024 for short runs, longest episode 17 beats. 06/19/2023   Event monitor November 2024, supraventricular ectopy burden 1.7%.  4 short runs of SVT, longest episode 17 beats.  No symptoms reported     Personal history of congenital hip dysplasia 05/30/2013   Preventative health care 01/29/2015   Rhinitis, allergic 09/22/2015   Rosacea 05/30/2013   Scoliosis deformity of spine 05/05/2020   Spinal stenosis of lumbar region 06/16/2023   Spondylolisthesis at L5-S1 level 09/14/2020   Tobacco abuse 05/30/2013     Family History  Problem Relation Age of Onset   Cancer Mother 88       history of colon cancer   Rosacea Mother    Colon cancer Mother 12   Colon polyps Mother    Rosacea Father    Lung disease Father        ?pulmonary fibrosis   Alcohol abuse Brother    Depression Brother    Colon cancer Maternal Grandmother    Endocrine tumor Daughter        pituitary tumor, POTTS   Thyroid  disease Son        ?hyperthyroid   Cancer Cousin        colon   Colon cancer Cousin    Esophageal cancer Neg Hx    Rectal cancer Neg Hx    Stomach cancer Neg Hx      Past Surgical History:  Procedure Laterality Date   ANTERIOR LAT LUMBAR FUSION Right 05/09/2023   Procedure: Extreme Lateral Interbody Fusion Lumbar three-four -right;  Surgeon: Agustina Aldrich, MD;  Location: [facility] OR;  Service: Neurosurgery;  Laterality: Right;  3C   COLONOSCOPY  06/17/2015   Pyrtle    COLONOSCOPY  07/20/2020   Pyrtle   HAMMER TOE SURGERY  07/28/2011   Procedure: HAMMER TOE CORRECTION;  Surgeon: Ferd Householder, MD;  Location: [facility];  Service: Orthopedics;  Laterality: Right;  right 2nd and 4th toes correction hammer toe, capsulotomy metatarsal-phalangeal joints   HERNIA REPAIR     HIP SURGERY  1986 & 2010   rt total hip-8/10-multiple rt hip surgeries- had 4 prior to 1986 as a child   JOINT REPLACEMENT  2010   lumber fusion  09/2020   POLYPECTOMY     SPINE SURGERY  09/14/2020   THUMB ARTHROSCOPY  2008   rt    Social History   Socioeconomic History   Marital status: Widowed    Spouse name: Not on file   Number of children: Not on file   Years of education: Not on file   Highest education level: Associate degree: occupational, Scientist, product/process development, or vocational program  Occupational History   Not on file  Tobacco Use   Smoking status: Former    Current packs/day: 0.00    Average packs/day: 0.5 packs/day for 50.3 years (25.2 ttl pk-yrs)    Types: Cigarettes    Start date: 50    Quit date: 12/11/2023   Smokeless tobacco: Never   Tobacco comments:    Started smoking at 68 years old.    Smoked 1 PPD at his heaviest.    Quit smoking on 12/11/2023  Vaping Use   Vaping status: Never Used  Substance and Sexual Activity   Alcohol use: Yes    Alcohol/week: 10.0 standard drinks of alcohol    Types: 10 Standard drinks or equivalent per week   Drug use: No   Sexual activity: Not Currently    Birth control/protection: None  Other Topics Concern   Not on file  Social History Narrative   Quality control Tech- gauges/callibrations.  (Retired)   Some colleg/tech school   wife passed   3 grown children (oldest daughter is living with them) youngest daughter lives in [place].  Curator at [employer].  Son lives near [place]- framing[place]art.   Social Drivers of Health   Financial Resource Strain: Medium Risk (12/03/2023)   Overall Financial Resource  Strain (CARDIA)    Difficulty of Paying Living Expenses: Somewhat hard  Food Insecurity: No Food Insecurity (12/03/2023)   Hunger Vital Sign    Worried About Running Out of Food in the Last Year: Never true    Ran Out of Food in the Last Year: Never true  Transportation Needs: No Transportation Needs (12/03/2023)   PRAPARE - Administrator, Civil Service (Medical): No    Lack of Transportation (Non-Medical): No  Physical Activity: Inactive (12/03/2023)   Exercise Vital Sign    Days of Exercise per Week: 0 days    Minutes of Exercise per Session: 10 min  Stress: No Stress Concern Present (12/03/2023)   [employer] of Occupational Health - Occupational Stress Questionnaire    Feeling of Stress : Not at all  Social Connections: Moderately Integrated (12/03/2023)   Social Connection and Isolation Panel [NHANES]    Frequency of Communication with Friends and Family: Three times a week    Frequency of Social Gatherings with Friends and Family: Once a week    Attends Religious Services: More than 4 times per year    Active Member of [employer] or Organizations: Yes    Attends Banker Meetings: More than 4 times per year    Marital Status: Widowed  Intimate Partner Violence: Not At Risk (02/16/2023)   Humiliation, Afraid, Rape, and Kick questionnaire    Fear of Current or Ex-Partner: No    Emotionally Abused: No    Physically  Abused: No    Sexually Abused: No     No Known Allergies   CBC    Component Value Date/Time   WBC 7.1 05/09/2023 0913   RBC 4.47 05/09/2023 0913   HGB 15.5 10/11/2023 0932   HCT 44.3 10/11/2023 0932   PLT 142 (L) 05/09/2023 0913   MCV 105.4 (H) 05/09/2023 0913   MCH 34.9 (H) 05/09/2023 0913   MCHC 33.1 05/09/2023 0913   RDW 12.4 05/09/2023 0913   LYMPHSABS 1.6 04/26/2021 0739   MONOABS 0.9 04/26/2021 0739   EOSABS 0.3 04/26/2021 0739   BASOSABS 0.1 04/26/2021 0739    Pulmonary Functions Testing Results:     No data to display           Outpatient Medications Prior to Visit  Medication Sig Dispense Refill   aspirin  EC 81 MG tablet Take 1 tablet (81 mg total) by mouth daily. Swallow whole.     calcium  carbonate (TUMS EX) 750 MG chewable tablet Chew 1-2 tablets by mouth daily as needed for heartburn.     empagliflozin  (JARDIANCE ) 10 MG TABS tablet Take 1 tablet (10 mg total) by mouth daily before breakfast. 90 tablet 1   metoprolol  succinate (TOPROL  XL) 25 MG 24 hr tablet Take 1 tablet (25 mg total) by mouth daily. 90 tablet 3   Multiple Vitamin (MULTIVITAMIN) tablet Take 1 tablet by mouth daily. Centrum silver     Oxycodone  HCl 10 MG TABS Take 1 tablet (10 mg total) by mouth every 6 (six) hours as needed (Pain). 30 tablet 0   sacubitril-valsartan (ENTRESTO ) 24-26 MG Take 1 tablet by mouth 2 (two) times daily. 180 tablet 2   STELARA 45 MG/0.5ML injection Inject 45 mg into the skin as directed. Every 12 weeks     timolol  (TIMOPTIC ) 0.5 % ophthalmic solution Place 1 drop into both eyes 2 (two) times daily.     TRAVATAN Z 0.004 % SOLN ophthalmic solution Place 1 drop into both eyes every morning.  1   Ustekinumab (STELARA IV) Inject into the vein.     Varenicline  Tartrate, Starter, (CHANTIX  STARTING MONTH PAK) 0.5 MG X 11 & 1 MG X 42 TBPK Take per package instructions 53 each 0   No facility-administered medications prior to visit.

## 2023-12-21 ENCOUNTER — Other Ambulatory Visit (HOSPITAL_COMMUNITY)

## 2023-12-21 DIAGNOSIS — I429 Cardiomyopathy, unspecified: Secondary | ICD-10-CM | POA: Diagnosis not present

## 2023-12-22 ENCOUNTER — Ambulatory Visit (HOSPITAL_COMMUNITY)
Admission: RE | Admit: 2023-12-22 | Discharge: 2023-12-22 | Disposition: A | Discharge status: Home / Self Care | Source: Ambulatory Visit | Attending: Student in an Organized Health Care Education/Training Program | Admitting: Student in an Organized Health Care Education/Training Program

## 2023-12-22 DIAGNOSIS — R911 Solitary pulmonary nodule: Secondary | ICD-10-CM | POA: Insufficient documentation

## 2023-12-22 LAB — BASIC METABOLIC PANEL WITH GFR
BUN/Creatinine Ratio: 17 (ref 10–24)
BUN: 15 mg/dL (ref 8–27)
CO2: 25 mmol/L (ref 20–29)
Calcium: 9.4 mg/dL (ref 8.6–10.2)
Chloride: 100 mmol/L (ref 96–106)
Creatinine, Ser: 0.87 mg/dL (ref 0.76–1.27)
Glucose: 128 mg/dL — ABNORMAL HIGH (ref 70–99)
Potassium: 4.9 mmol/L (ref 3.5–5.2)
Sodium: 138 mmol/L (ref 134–144)
eGFR: 93 mL/min/{1.73_m2} (ref >59)

## 2023-12-22 LAB — GLUCOSE, CAPILLARY: Glucose-Capillary: 95 mg/dL (ref 70–99)

## 2023-12-22 MED ORDER — FLUDEOXYGLUCOSE F - 18 (FDG) INJECTION
7.0000 | Freq: Once | INTRAVENOUS | Status: AC
  Administered 2023-12-22: 7.57 via INTRAVENOUS

## 2023-12-28 ENCOUNTER — Ambulatory Visit: Payer: Self-pay | Admitting: Student in an Organized Health Care Education/Training Program

## 2023-12-28 ENCOUNTER — Telehealth: Payer: Self-pay

## 2023-12-28 DIAGNOSIS — K118 Other diseases of salivary glands: Secondary | ICD-10-CM

## 2023-12-28 DIAGNOSIS — R911 Solitary pulmonary nodule: Secondary | ICD-10-CM

## 2023-12-28 NOTE — Telephone Encounter (Signed)
 Robotic bronch with EBUS 01/09/2024 at 9:45am Lung Nodule [id number], [id number], [id number]  Brett [name] please bronch infor.

## 2023-12-28 NOTE — Telephone Encounter (Signed)
 Brett [name] Radiology on 01/08/2024 Arrive at 3:00pm CT chest- 3:30pm  CT Neck- 4:00pm   I have notified the patient and his daughter of the date and times. Bronch email has been sent.

## 2023-12-29 NOTE — Telephone Encounter (Signed)
 Noted. Nothing further needed.

## 2023-12-29 NOTE — Telephone Encounter (Signed)
 For the codes 40981, [id number], [phone] Prior Auth Not Required Refer # [id number]

## 2024-01-01 ENCOUNTER — Encounter: Payer: Self-pay | Admitting: Family

## 2024-01-01 DIAGNOSIS — Z720 Tobacco use: Secondary | ICD-10-CM

## 2024-01-02 ENCOUNTER — Other Ambulatory Visit (HOSPITAL_BASED_OUTPATIENT_CLINIC_OR_DEPARTMENT_OTHER): Payer: Self-pay

## 2024-01-02 MED ORDER — VARENICLINE TARTRATE 1 MG PO TABS
1.0000 mg | ORAL_TABLET | Freq: Two times a day (BID) | ORAL | 1 refills | Status: DC
  Filled 2024-01-02: qty 60, 30d supply, fill #0
  Filled 2024-02-01: qty 60, 30d supply, fill #1

## 2024-01-04 ENCOUNTER — Encounter: Payer: Self-pay | Admitting: Student in an Organized Health Care Education/Training Program

## 2024-01-04 ENCOUNTER — Encounter
Admission: RE | Admit: 2024-01-04 | Discharge: 2024-01-04 | Disposition: A | Discharge status: Home / Self Care | Source: Ambulatory Visit | Attending: Student in an Organized Health Care Education/Training Program | Admitting: Student in an Organized Health Care Education/Training Program

## 2024-01-04 ENCOUNTER — Other Ambulatory Visit: Payer: Self-pay

## 2024-01-04 ENCOUNTER — Other Ambulatory Visit (HOSPITAL_BASED_OUTPATIENT_CLINIC_OR_DEPARTMENT_OTHER): Payer: Self-pay

## 2024-01-04 ENCOUNTER — Telehealth: Payer: Self-pay | Admitting: *Deleted

## 2024-01-04 VITALS — Ht 74.0 in | Wt 156.0 lb

## 2024-01-04 DIAGNOSIS — R911 Solitary pulmonary nodule: Secondary | ICD-10-CM

## 2024-01-04 DIAGNOSIS — Z01812 Encounter for preprocedural laboratory examination: Secondary | ICD-10-CM

## 2024-01-04 HISTORY — DX: Solitary pulmonary nodule: R91.1

## 2024-01-04 NOTE — Patient Instructions (Addendum)
 Your procedure is scheduled on: Tuesday , 01/09/2024  Report to the Registration Desk on the 1st floor of the [facility]. To find out your arrival time, please call [phone] between 1PM - 3PM on: Monday, June 09/2023  If your arrival time is 6:00 am, do not arrive before that time as the [facility] entrance doors do not open until 6:00 am.  REMEMBER: Instructions that are not followed completely may result in serious medical risk, up to and including death; or upon the discretion of your surgeon and anesthesiologist your surgery may need to be rescheduled.  Do not eat food after midnight the night before surgery.  No gum chewing or hard candies.  One week prior to surgery: Stop Anti-inflammatories (NSAIDS) such as Advil , Aleve, Ibuprofen , Motrin , Naproxen, Naprosyn and Aspirin  based products such as Excedrin, Goody's Powder, BC Powder. Stop ANY OVER THE COUNTER supplements until after surgery.   You may however, continue to take Tylenol  if needed for pain up until the day of surgery.   Hold Jardiance  three days before surgery. Last dose would be Friday, May 30,  Do not take Aspirin  on day of surgery only.    Continue taking all of your other prescription medications up until the day of surgery.  ON THE DAY OF SURGERY ONLY TAKE THESE MEDICATIONS WITH SIPS OF WATER:  metoprolol  succinate (TOPROL  XL) sacubitril-valsartan (ENTRESTO ) Oxycodone  as needed  Use eyedrops as prescribed.   No Alcohol for 24 hours before or after surgery.  No Smoking including e-cigarettes for 24 hours before surgery.  No chewable tobacco products for at least 6 hours before surgery.  No nicotine  patches on the day of surgery.  Do not use any "recreational" drugs for at least a week (preferably 2 weeks) before your surgery.  Please be advised that the combination of cocaine and anesthesia may have negative outcomes, up to and including death. If you test positive for cocaine, your surgery will be  cancelled.  On the morning of surgery brush your teeth with toothpaste and water, you may rinse your mouth with mouthwash if you wish. Do not swallow any toothpaste or mouthwash.  Use CHG Soap or wipes as directed on instruction sheet.  Do not wear jewelry, make-up, hairpins, clips or nail polish.  For welded (permanent) jewelry: bracelets, anklets, waist bands, etc.  Please have this removed prior to surgery.  If it is not removed, there is a chance that hospital personnel will need to cut it off on the day of surgery.  Do not wear lotions, powders, or perfumes.   Do not shave body hair from the neck down 48 hours before surgery.  Contact lenses, hearing aids and dentures may not be worn into surgery.  Do not bring valuables to the hospital. [facility] is not responsible for any missing/lost belongings or valuables.    Notify your doctor if there is any change in your medical condition (cold, fever, infection).  Wear comfortable clothing (specific to your surgery type) to the hospital.  After surgery, you can help prevent lung complications by doing breathing exercises.  Take deep breaths and cough every 1-2 hours. Your doctor may order a device called an Incentive Spirometer to help you take deep breaths.  If you are being admitted to the hospital overnight, leave your suitcase in the car. After surgery it may be brought to your room.  If you are being discharged the day of surgery, you will not be allowed to drive home. You will  need a responsible individual to drive you home and stay with you for 24 hours after surgery.   Please call the Pre-admissions Testing Dept. at [phone] if you have any questions about these instructions.  Surgery Visitation Policy:  Patients having surgery or a procedure may have two visitors.  Children under the age of 22 must have an adult with them who is not the patient.

## 2024-01-04 NOTE — Telephone Encounter (Signed)
-----   Message from Shirline Dover sent at 01/04/2024 10:16 AM EDT ----- Regarding: Request for pre-operative cardiac clearance Request for pre-operative cardiac clearance:  1. What type of surgery is being performed?  ENB/EBUS  2. When is this surgery scheduled?  01/09/2024  3. Type of clearance being requested (medical, pharmacy, both)? MEDICAL   4. Are there any medications that need to be held prior to surgery? N/A - patient to continue daily LOW DOSE ASPIRIN  throughout the perioperative course  5. Practice name and name of physician performing surgery?  Performing surgeon: Dr. Vergia Glasgow, MD Requesting clearance: Renate Caroline, FNP-C    6. Anesthesia type (none, local, MAC, general)? GENERAL  7. What is the office phone and fax number?   Fax: [phone]  ATTENTION: Unable to create telephone message as per your standard workflow. Directed by [facility] providers to send requests for cardiac clearance to this pool for appropriate distribution to provider covering pre-operative clearances.   Renate Caroline, MSN, APRN, FNP-C, CEN [facility]  Peri-operative Services Nurse Practitioner Phone: [phone] 01/04/24 10:16 AM

## 2024-01-04 NOTE — Telephone Encounter (Signed)
   Patient Name: Brett [name]  DOB: August 02, 1956 MRN: [id number]  Primary Cardiologist: None  Chart reviewed as part of pre-operative protocol coverage.  Patient was recently seen in the office on 12/07/2023 by Dr. Ronell Coe.    Per Dr. Ronell Coe, "From cardiac standpoint at last office visit mild cardiomyopathy with euvolemic, compensated and no active acute heart failure signs or symptoms.  Okay to proceed with his procedure as being planned. Cardiac PET/CT stress test without ischemia. Okay to hold aspirin  as needed from cardiac standpoint. Thank you"    I will route this recommendation to the requesting party via Epic fax function and remove from pre-op pool.  Please call with questions.  Jude Norton, NP 01/04/2024, 4:15 PM

## 2024-01-04 NOTE — Telephone Encounter (Signed)
   Pre-operative Risk Assessment    Patient Name: Brett [name]  DOB: 1955/10/29 MRN: [id number]   Date of last office visit: 12/07/23 DR. SREEDHAR [name] Date of next office visit: 03/26/24 DR. [facility]   Request for Surgical Clearance    Procedure:  ENB/EBUS  Date of Surgery:  Clearance 01/09/24                                Surgeon:  DR. KHABIB [name] Surgeon's Group or Practice Name:  [facility] Phone number:  [phone] Renate Caroline, FNP Fax number:  [phone]   Type of Clearance Requested:   - Medical ; PER SURGERY SCHEDULER, PT CAN CONTINUE ON ASA THROUGHOUT PERIOPERATIVE PERIOD   Type of Anesthesia:  General    Additional requests/questions:    Princeton Broom   01/04/2024, 10:34 AM

## 2024-01-07 ENCOUNTER — Other Ambulatory Visit: Payer: Self-pay | Admitting: Family

## 2024-01-07 DIAGNOSIS — Z720 Tobacco use: Secondary | ICD-10-CM

## 2024-01-08 ENCOUNTER — Ambulatory Visit (HOSPITAL_COMMUNITY)
Admission: RE | Admit: 2024-01-08 | Discharge: 2024-01-08 | Disposition: A | Discharge status: Home / Self Care | Source: Ambulatory Visit | Attending: Student in an Organized Health Care Education/Training Program | Admitting: Student in an Organized Health Care Education/Training Program

## 2024-01-08 ENCOUNTER — Encounter
Admission: RE | Admit: 2024-01-08 | Discharge: 2024-01-08 | Disposition: A | Discharge status: Home / Self Care | Source: Ambulatory Visit | Attending: Student in an Organized Health Care Education/Training Program | Admitting: Student in an Organized Health Care Education/Training Program

## 2024-01-08 DIAGNOSIS — I700 Atherosclerosis of aorta: Secondary | ICD-10-CM | POA: Insufficient documentation

## 2024-01-08 DIAGNOSIS — R911 Solitary pulmonary nodule: Secondary | ICD-10-CM | POA: Insufficient documentation

## 2024-01-08 DIAGNOSIS — Z01818 Encounter for other preprocedural examination: Secondary | ICD-10-CM | POA: Diagnosis not present

## 2024-01-08 DIAGNOSIS — I2510 Atherosclerotic heart disease of native coronary artery without angina pectoris: Secondary | ICD-10-CM | POA: Diagnosis not present

## 2024-01-08 DIAGNOSIS — Z01812 Encounter for preprocedural laboratory examination: Secondary | ICD-10-CM

## 2024-01-08 DIAGNOSIS — J432 Centrilobular emphysema: Secondary | ICD-10-CM | POA: Diagnosis not present

## 2024-01-08 DIAGNOSIS — J439 Emphysema, unspecified: Secondary | ICD-10-CM | POA: Insufficient documentation

## 2024-01-08 DIAGNOSIS — K118 Other diseases of salivary glands: Secondary | ICD-10-CM | POA: Insufficient documentation

## 2024-01-08 DIAGNOSIS — R9431 Abnormal electrocardiogram [ECG] [EKG]: Secondary | ICD-10-CM | POA: Insufficient documentation

## 2024-01-08 DIAGNOSIS — R918 Other nonspecific abnormal finding of lung field: Secondary | ICD-10-CM | POA: Diagnosis not present

## 2024-01-08 DIAGNOSIS — Z01810 Encounter for preprocedural cardiovascular examination: Secondary | ICD-10-CM | POA: Diagnosis not present

## 2024-01-08 DIAGNOSIS — R930 Abnormal findings on diagnostic imaging of skull and head, not elsewhere classified: Secondary | ICD-10-CM | POA: Insufficient documentation

## 2024-01-08 LAB — CBC
HCT: 44 % (ref 39.0–52.0)
Hemoglobin: 14.8 g/dL (ref 13.0–17.0)
MCH: 33.1 pg (ref 26.0–34.0)
MCHC: 33.6 g/dL (ref 30.0–36.0)
MCV: 98.4 fL (ref 80.0–100.0)
Platelets: 172 10*3/uL (ref 150–400)
RBC: 4.47 MIL/uL (ref 4.22–5.81)
RDW: 12 % (ref 11.5–15.5)
WBC: 8.1 10*3/uL (ref 4.0–10.5)
nRBC: 0 % (ref 0.0–0.2)

## 2024-01-08 MED ORDER — GADOBUTROL 1 MMOL/ML IV SOLN
7.0000 mL | Freq: Once | INTRAVENOUS | Status: AC | PRN
  Administered 2024-01-08: 7 mL via INTRAVENOUS

## 2024-01-08 NOTE — Progress Notes (Signed)
 Cardiac clearance from 5-29     Jude Norton, NP  Nurse Practitioner Cardiology   Telephone Encounter Signed   Encounter Date: 01/04/2024  Related encounter: Telephone from 01/04/2024 in [facility] of [employer]. [facility]   Signed          Patient Name: Brett [name]  DOB: February 09, 1956 MRN: [id number]   Primary Cardiologist: None   Chart reviewed as part of pre-operative protocol coverage.  Patient was recently seen in the office on 12/07/2023 by Dr. Ronell Coe.     Per Dr. Ronell Coe, "From cardiac standpoint at last office visit mild cardiomyopathy with euvolemic, compensated and no active acute heart failure signs or symptoms.   Okay to proceed with his procedure as being planned. Cardiac PET/CT stress test without ischemia. Okay to hold aspirin  as needed from cardiac standpoint. Thank you"      I will route this recommendation to the requesting party via Epic fax function and remove from pre-op pool.   Please call with questions.   Jude Norton, NP 01/04/2024, 4:15 PM

## 2024-01-09 ENCOUNTER — Encounter: Payer: Self-pay | Admitting: Student in an Organized Health Care Education/Training Program

## 2024-01-09 ENCOUNTER — Ambulatory Visit: Admitting: Certified Registered Nurse Anesthetist

## 2024-01-09 ENCOUNTER — Ambulatory Visit

## 2024-01-09 ENCOUNTER — Other Ambulatory Visit: Payer: Self-pay

## 2024-01-09 ENCOUNTER — Encounter
Admission: RE | Disposition: A | Discharge status: Home / Self Care | Payer: Self-pay | Source: Home / Self Care | Attending: Student in an Organized Health Care Education/Training Program

## 2024-01-09 ENCOUNTER — Ambulatory Visit
Admission: RE | Admit: 2024-01-09 | Discharge: 2024-01-09 | Disposition: A | Discharge status: Home / Self Care | Attending: Student in an Organized Health Care Education/Training Program | Admitting: Student in an Organized Health Care Education/Training Program

## 2024-01-09 DIAGNOSIS — Z48813 Encounter for surgical aftercare following surgery on the respiratory system: Secondary | ICD-10-CM | POA: Diagnosis not present

## 2024-01-09 DIAGNOSIS — Z5986 Financial insecurity: Secondary | ICD-10-CM | POA: Diagnosis not present

## 2024-01-09 DIAGNOSIS — R911 Solitary pulmonary nodule: Secondary | ICD-10-CM | POA: Insufficient documentation

## 2024-01-09 DIAGNOSIS — I509 Heart failure, unspecified: Secondary | ICD-10-CM | POA: Insufficient documentation

## 2024-01-09 DIAGNOSIS — C3432 Malignant neoplasm of lower lobe, left bronchus or lung: Secondary | ICD-10-CM | POA: Diagnosis not present

## 2024-01-09 DIAGNOSIS — K219 Gastro-esophageal reflux disease without esophagitis: Secondary | ICD-10-CM | POA: Insufficient documentation

## 2024-01-09 DIAGNOSIS — J439 Emphysema, unspecified: Secondary | ICD-10-CM | POA: Insufficient documentation

## 2024-01-09 DIAGNOSIS — Z87891 Personal history of nicotine dependence: Secondary | ICD-10-CM | POA: Diagnosis not present

## 2024-01-09 HISTORY — PX: ENDOBRONCHIAL ULTRASOUND: SHX5096

## 2024-01-09 HISTORY — PX: BRONCHOSCOPY, WITH BIOPSY USING ELECTROMAGNETIC NAVIGATION: SHX7536

## 2024-01-09 SURGERY — BRONCHOSCOPY, WITH BIOPSY USING ELECTROMAGNETIC NAVIGATION
Anesthesia: General | Laterality: Bilateral

## 2024-01-09 MED ORDER — ONDANSETRON HCL 4 MG/2ML IJ SOLN
INTRAMUSCULAR | Status: AC
  Filled 2024-01-09: qty 2

## 2024-01-09 MED ORDER — CHLORHEXIDINE GLUCONATE 0.12 % MT SOLN
OROMUCOSAL | Status: AC
  Filled 2024-01-09: qty 15

## 2024-01-09 MED ORDER — PROPOFOL 500 MG/50ML IV EMUL
INTRAVENOUS | Status: DC | PRN
Start: 2024-01-09 — End: 2024-01-09
  Administered 2024-01-09: 100 ug/kg/min via INTRAVENOUS

## 2024-01-09 MED ORDER — ACETAMINOPHEN 10 MG/ML IV SOLN: 1000.0000 mg | Freq: Once | INTRAVENOUS | Status: DC | PRN

## 2024-01-09 MED ORDER — OXYCODONE HCL 5 MG PO TABS: 5.0000 mg | ORAL_TABLET | Freq: Once | ORAL | Status: DC | PRN

## 2024-01-09 MED ORDER — MIDAZOLAM HCL 2 MG/2ML IJ SOLN
INTRAMUSCULAR | Status: DC | PRN
  Administered 2024-01-09: 1.5 mg via INTRAVENOUS

## 2024-01-09 MED ORDER — DEXAMETHASONE SODIUM PHOSPHATE 10 MG/ML IJ SOLN
INTRAMUSCULAR | Status: DC | PRN
  Administered 2024-01-09: 10 mg via INTRAVENOUS

## 2024-01-09 MED ORDER — OXYCODONE HCL 5 MG/5ML PO SOLN: 5.0000 mg | Freq: Once | ORAL | Status: DC | PRN

## 2024-01-09 MED ORDER — PROPOFOL 1000 MG/100ML IV EMUL
INTRAVENOUS | Status: AC
  Filled 2024-01-09: qty 100

## 2024-01-09 MED ORDER — LACTATED RINGERS IV SOLN: INTRAVENOUS | Status: DC

## 2024-01-09 MED ORDER — ROCURONIUM BROMIDE 10 MG/ML (PF) SYRINGE
PREFILLED_SYRINGE | INTRAVENOUS | Status: AC
  Filled 2024-01-09: qty 10

## 2024-01-09 MED ORDER — SUGAMMADEX SODIUM 200 MG/2ML IV SOLN
INTRAVENOUS | Status: DC | PRN
  Administered 2024-01-09: 141.6 mg via INTRAVENOUS

## 2024-01-09 MED ORDER — MIDAZOLAM HCL 2 MG/2ML IJ SOLN
INTRAMUSCULAR | Status: AC
  Filled 2024-01-09: qty 2

## 2024-01-09 MED ORDER — PHENYLEPHRINE HCL-NACL 20-0.9 MG/250ML-% IV SOLN
INTRAVENOUS | Status: AC
Start: 2024-01-09 — End: ?
  Filled 2024-01-09: qty 250

## 2024-01-09 MED ORDER — FENTANYL CITRATE (PF) 100 MCG/2ML IJ SOLN
INTRAMUSCULAR | Status: AC
Start: 2024-01-09 — End: ?
  Filled 2024-01-09: qty 2

## 2024-01-09 MED ORDER — FENTANYL CITRATE (PF) 100 MCG/2ML IJ SOLN: 25.0000 ug | INTRAMUSCULAR | Status: DC | PRN

## 2024-01-09 MED ORDER — LIDOCAINE HCL (PF) 2 % IJ SOLN
INTRAMUSCULAR | Status: AC
  Filled 2024-01-09: qty 5

## 2024-01-09 MED ORDER — ONDANSETRON HCL 4 MG/2ML IJ SOLN
4.0000 mg | Freq: Once | INTRAMUSCULAR | Status: DC | PRN
Start: 2024-01-09 — End: 2024-01-09

## 2024-01-09 MED ORDER — LIDOCAINE HCL (CARDIAC) PF 100 MG/5ML IV SOSY
PREFILLED_SYRINGE | INTRAVENOUS | Status: DC | PRN
  Administered 2024-01-09: 70 mg via INTRAVENOUS

## 2024-01-09 MED ORDER — PROPOFOL 10 MG/ML IV BOLUS
INTRAVENOUS | Status: DC | PRN
  Administered 2024-01-09: 150 mg via INTRAVENOUS

## 2024-01-09 MED ORDER — ROCURONIUM BROMIDE 100 MG/10ML IV SOLN
INTRAVENOUS | Status: DC | PRN
  Administered 2024-01-09: 40 mg via INTRAVENOUS

## 2024-01-09 MED ORDER — PHENYLEPHRINE HCL-NACL 20-0.9 MG/250ML-% IV SOLN
INTRAVENOUS | Status: DC | PRN
Start: 2024-01-09 — End: 2024-01-09
  Administered 2024-01-09: 25 ug/min via INTRAVENOUS

## 2024-01-09 MED ORDER — CHLORHEXIDINE GLUCONATE 0.12 % MT SOLN
15.0000 mL | Freq: Once | OROMUCOSAL | Status: AC
Start: 2024-01-09 — End: 2024-01-09
  Administered 2024-01-09: 15 mL via OROMUCOSAL

## 2024-01-09 MED ORDER — ORAL CARE MOUTH RINSE
15.0000 mL | Freq: Once | OROMUCOSAL | Status: AC
Start: 2024-01-09 — End: 2024-01-09

## 2024-01-09 MED ORDER — FENTANYL CITRATE (PF) 100 MCG/2ML IJ SOLN
INTRAMUSCULAR | Status: DC | PRN
Start: 2024-01-09 — End: 2024-01-09
  Administered 2024-01-09: 50 ug via INTRAVENOUS

## 2024-01-09 MED ORDER — DEXAMETHASONE SODIUM PHOSPHATE 10 MG/ML IJ SOLN
INTRAMUSCULAR | Status: AC
  Filled 2024-01-09: qty 1

## 2024-01-09 MED ORDER — PHENYLEPHRINE HCL-NACL 20-0.9 MG/250ML-% IV SOLN
INTRAVENOUS | Status: AC
  Filled 2024-01-09: qty 250

## 2024-01-09 NOTE — Anesthesia Preprocedure Evaluation (Addendum)
 Anesthesia Evaluation  Patient identified by MRN, date of birth, ID band Patient awake    Reviewed: Allergy & Precautions, NPO status , Patient's Chart, lab work & pertinent test results  History of Anesthesia Complications Negative for: history of anesthetic complications  Airway Mallampati: II   Neck ROM: Full    Dental   Caps :   Pulmonary former smoker (quit 12/11/23)   Pulmonary exam normal breath sounds clear to auscultation       Cardiovascular +CHF (cardiomyopathy, EF 45%)  Normal cardiovascular exam+ dysrhythmias Supra Ventricular Tachycardia  Rhythm:Regular Rate:Normal  ECG 01/08/24:  Sinus bradycardia Septal infarct (cited on or before 19-Jun-2023) Abnormal ECG When compared with ECG of 19-Jun-2023 11:26, No significant change was found   Neuro/Psych negative neurological ROS     GI/Hepatic ,GERD  ,,  Endo/Other  negative endocrine ROS    Renal/GU negative Renal ROS     Musculoskeletal  (+) Arthritis ,    Abdominal   Peds  Hematology negative hematology ROS (+)   Anesthesia Other Findings Cardiology note 12/07/23:  No heart failure signs or symptoms at this time. Educated about salt restriction below 2 g/day. Continue weight monitoring.   Advised abstinence from alcohol.   Currently not on any loop diuretic. Guideline-directed medical therapy being initiated - Continue metoprolol  succinate 25 mg once daily Start Entresto  24 mg - 26 mg twice daily Start Jardiance  10 mg once daily. Discussed about potential side effects of these medication including hypertension, electrolyte abnormalities, renal function changes, potential for allergy reaction such as angioedema, skin and urinary tract infections.   No further escalation with addition of spironolactone if renal function and electrolytes remain stable on follow-up blood work tentatively in 2 weeks.   Referred to specialty clinic for cardiac  sarcoidosis evaluation.  Currently scheduled tentatively in August.  Will see if this can be scheduled sooner if possible.   Reproductive/Obstetrics                             Anesthesia Physical Anesthesia Plan  ASA: 2  Anesthesia Plan: General   Post-op Pain Management:    Induction: Intravenous  PONV Risk Score and Plan: 2 and Ondansetron , Dexamethasone  and Treatment may vary due to age or medical condition  Airway Management Planned: Oral ETT  Additional Equipment:   Intra-op Plan:   Post-operative Plan: Extubation in OR  Informed Consent: I have reviewed the patients History and Physical, chart, labs and discussed the procedure including the risks, benefits and alternatives for the proposed anesthesia with the patient or authorized representative who has indicated his/her understanding and acceptance.     Dental advisory given  Plan Discussed with: CRNA  Anesthesia Plan Comments: (Patient consented for risks of anesthesia including but not limited to:  - adverse reactions to medications - damage to eyes, teeth, lips or other oral mucosa - nerve damage due to positioning  - sore throat or hoarseness - damage to heart, brain, nerves, lungs, other parts of body or loss of life  Informed patient about role of CRNA in peri- and intra-operative care.  Patient voiced understanding.)        Anesthesia Quick Evaluation

## 2024-01-09 NOTE — Op Note (Addendum)
 Video Bronchoscopy with Robotic Assisted Bronchoscopic Navigation   Date of Operation: 01/09/2024   Pre-op Diagnosis: lung nodule  Surgeon: Darnelle Elders  Anesthesia: General endotracheal anesthesia  Operation: Flexible video fiberoptic bronchoscopy with robotic assistance and biopsies.  Estimated Blood Loss: Minimal  Complications: None  Indications and History: Brett [name] is a 68 y.o. male with history of smoking presenting with a lung nodule.  Recommendation made to achieve a tissue diagnosis via robotic assisted navigational bronchoscopy.  The risks, benefits, complications, treatment options and expected outcomes were discussed with the patient.  The possibilities of pneumothorax, pneumonia, reaction to medication, pulmonary aspiration, perforation of a viscus, bleeding, failure to diagnose a condition and creating a complication requiring transfusion or operation were discussed with the patient who freely signed the consent.    Description of Procedure: The patient was seen in the Preoperative Area, was examined and was deemed appropriate to proceed.  The patient was taken to [facility] Endoscopy room 3, identified as [name] and the procedure verified as Flexible Video Fiberoptic Bronchoscopy.  A Time Out was held and the above information confirmed.   Prior to the date of the procedure a high-resolution CT scan of the chest was performed. Utilizing ION software program a virtual tracheobronchial tree was generated to allow the creation of distinct navigation pathways to the patient's parenchymal abnormalities. After being taken to the operating room general anesthesia was initiated and the patient  was orally intubated. The video fiberoptic bronchoscope was introduced via the endotracheal tube and a general inspection was performed which showed normal right and left lung anatomy. Aspiration of the bilateral mainstems was completed to remove any remaining secretions. Robotic catheter  inserted into patient's endotracheal tube.   Target #1 LLL nodule: The distinct navigation pathways prepared prior to this procedure were then utilized to navigate to patient's lesion identified on CT scan. The robotic catheter was secured into place and the vision probe was withdrawn.  Lesion location was approximated using fluoroscopy.  Local registration and targeting was performed using GE 3D OEC mobile C-arm three-dimensional imaging. Under fluoroscopic guidance transbronchial brushings, transbronchial needle biopsies, and transbronchial forceps biopsies were performed to be sent for cytology, culture, and pathology. We used the radial EBUS, where a concentric image was acquired, to guide our brushing and biopsy.  Needle-in-lesion was confirmed using GE mobile C-arm.  A bronchioalveolar lavage was performed in the LLL and sent for microbiology.  Tool in lesion    At the end of the procedure, the EBUS bronchoscope was introduced and the hilar and mediastinal lymph node stations were examined. No enlarged lymph nodes were encountered and non were biopsied. We then performed a general airway inspection and there was no evidence of active bleeding. The bronchoscope was removed.  The patient tolerated the procedure well. There was no significant blood loss and there were no obvious complications. A post-procedural chest x-ray is pending.  Samples Target #1: LLL nodule 1. Transbronchial needle brushings from LLL nodule 2. Transbronchial Wang needle biopsies from LLL nodule 3. Transbronchial forceps biopsies from LLL nodule 4. Bronchoalveolar lavage from LLL  EBUS performed, no enlarged lymph nodes encountered.  Plans:  The patient will be discharged from the PACU to home when recovered from anesthesia and after chest x-ray is reviewed. We will review the cytology, pathology and microbiology results with the patient when they become available. Outpatient followup will be with myself.  Vergia Glasgow, MD [facility] 01/09/2024 12:01 PM *

## 2024-01-09 NOTE — Interval H&P Note (Signed)
 S: feels well, cough resolved, no chest pain and no shortness of breath  O: Vitals:   01/09/24 0851  BP: 128/80  Pulse: 70  Resp: 16  Temp: (!) 97.1 F (36.2 C)  SpO2: 98%   General: Well-appearing and in no distress. Well-nourished Eyes: Anicteric, no conjunctival pallor HEENT: Mucous membranes moist, no evidence of postnasal drip Lymphadenopathy: No cervical or supraclavicular adenopathy Respiratory: Trachea is midline, no respiratory distress, good bilateral air entry, no wheezes, rales, or rhonchi Cardiovascular: Heart with regular rate and rhythm, normal S1 and S2, no murmurs, rubs, or gallops Gastrointestinal: Normoactive bowel sounds, soft and nontender Neuro: Alert and oriented, no gross focal deficits  A/P: 68 year old male presenting with a LLL pulmonary nodule. He is scheduled for robotic assisted navigational bronchoscopy and EBUS/TBNA for staging and biopsy of the mediastinum. Risks and benefits were explained and patient is appropriate for the procedure.  Vergia Glasgow, MD [facility] 01/09/2024 10:13 AM

## 2024-01-09 NOTE — Anesthesia Postprocedure Evaluation (Signed)
 Anesthesia Post Note  Patient: Brett [name]  Procedure(s) Performed: ROBOTIC ASSISTED NAVIGATIONAL BRONCHOSCOPY (Bilateral) ENDOBRONCHIAL ULTRASOUND (EBUS) (Bilateral)  Patient location during evaluation: PACU Anesthesia Type: General Level of consciousness: awake and alert, oriented and patient cooperative Pain management: pain level controlled Vital Signs Assessment: post-procedure vital signs reviewed and stable Respiratory status: spontaneous breathing, nonlabored ventilation and respiratory function stable Cardiovascular status: blood pressure returned to baseline and stable Postop Assessment: adequate PO intake Anesthetic complications: no   No notable events documented.   Last Vitals:  Vitals:   01/09/24 1230 01/09/24 1248  BP: (!) 128/93 121/65  Pulse: 75   Resp: 13 15  Temp:  (!) 36.3 C  SpO2: 98% 97%    Last Pain:  Vitals:   01/09/24 1248  TempSrc: Temporal  PainSc: 0-No pain                 Ulmer Degen

## 2024-01-09 NOTE — Anesthesia Procedure Notes (Signed)
 Procedure Name: Intubation Date/Time: 01/09/2024 11:16 AM  Performed by: Clementine Cutting, CRNAPre-anesthesia Checklist: Patient identified, Patient being monitored, Timeout performed, Emergency Drugs available and Suction available Patient Re-evaluated:Patient Re-evaluated prior to induction Oxygen Delivery Method: Circle system utilized Preoxygenation: Pre-oxygenation with 100% oxygen Induction Type: IV induction Ventilation: Mask ventilation without difficulty Laryngoscope Size: 3 and McGrath Grade View: Grade I Tube type: Oral Tube size: 8.5 mm Number of attempts: 1 Airway Equipment and Method: Stylet and Video-laryngoscopy Placement Confirmation: ETT inserted through vocal cords under direct vision, positive ETCO2 and breath sounds checked- equal and bilateral Secured at: 24 cm Tube secured with: Tape Dental Injury: Teeth and Oropharynx as per pre-operative assessment

## 2024-01-09 NOTE — Transfer of Care (Signed)
 Immediate Anesthesia Transfer of Care Note  Patient: Brett [name]  Procedure(s) Performed: ROBOTIC ASSISTED NAVIGATIONAL BRONCHOSCOPY (Bilateral) ENDOBRONCHIAL ULTRASOUND (EBUS) (Bilateral)  Patient Location: PACU  Anesthesia Type:General  Level of Consciousness: awake and alert   Airway & Oxygen Therapy: Patient Spontanous Breathing and Patient connected to face mask oxygen  Post-op Assessment: Report given to RN and Post -op Vital signs reviewed and stable  Post vital signs: Reviewed and stable  Last Vitals:  Vitals Value Taken Time  BP 137/65 01/09/24 1211  Temp    Pulse 68 01/09/24 1212  Resp 22 01/09/24 1212  SpO2 100 % 01/09/24 1212  Vitals shown include unfiled device data.  Last Pain:  Vitals:   01/09/24 0851  TempSrc: Temporal  PainSc: 0-No pain         Complications: No notable events documented.

## 2024-01-10 ENCOUNTER — Encounter: Payer: Self-pay | Admitting: Student in an Organized Health Care Education/Training Program

## 2024-01-10 LAB — SURGICAL PATHOLOGY

## 2024-01-11 ENCOUNTER — Ambulatory Visit: Payer: Self-pay | Admitting: Family

## 2024-01-11 DIAGNOSIS — C3432 Malignant neoplasm of lower lobe, left bronchus or lung: Secondary | ICD-10-CM

## 2024-01-11 DIAGNOSIS — M4316 Spondylolisthesis, lumbar region: Secondary | ICD-10-CM | POA: Diagnosis not present

## 2024-01-11 LAB — ACID FAST SMEAR (AFB, MYCOBACTERIA)
Acid Fast Smear: NEGATIVE
Acid Fast Smear: NEGATIVE

## 2024-01-12 LAB — CULTURE, RESPIRATORY W GRAM STAIN
Gram Stain: NONE SEEN
Gram Stain: NONE SEEN

## 2024-01-12 LAB — FUNGUS CULTURE WITH STAIN

## 2024-01-15 ENCOUNTER — Ambulatory Visit: Payer: Self-pay | Admitting: Student in an Organized Health Care Education/Training Program

## 2024-01-15 DIAGNOSIS — K118 Other diseases of salivary glands: Secondary | ICD-10-CM

## 2024-01-15 LAB — CYTOLOGY - NON PAP

## 2024-01-16 ENCOUNTER — Inpatient Hospital Stay: Attending: Internal Medicine | Admitting: Internal Medicine

## 2024-01-16 ENCOUNTER — Inpatient Hospital Stay

## 2024-01-16 ENCOUNTER — Other Ambulatory Visit: Payer: Self-pay | Admitting: Internal Medicine

## 2024-01-16 VITALS — BP 105/71 | HR 80 | Temp 97.6°F | Resp 17 | Ht 74.0 in | Wt 163.0 lb

## 2024-01-16 DIAGNOSIS — Z800 Family history of malignant neoplasm of digestive organs: Secondary | ICD-10-CM | POA: Insufficient documentation

## 2024-01-16 DIAGNOSIS — C3432 Malignant neoplasm of lower lobe, left bronchus or lung: Secondary | ICD-10-CM | POA: Diagnosis not present

## 2024-01-16 DIAGNOSIS — M419 Scoliosis, unspecified: Secondary | ICD-10-CM | POA: Diagnosis not present

## 2024-01-16 DIAGNOSIS — I471 Supraventricular tachycardia, unspecified: Secondary | ICD-10-CM | POA: Diagnosis not present

## 2024-01-16 DIAGNOSIS — M549 Dorsalgia, unspecified: Secondary | ICD-10-CM | POA: Insufficient documentation

## 2024-01-16 DIAGNOSIS — C801 Malignant (primary) neoplasm, unspecified: Secondary | ICD-10-CM

## 2024-01-16 DIAGNOSIS — I5020 Unspecified systolic (congestive) heart failure: Secondary | ICD-10-CM | POA: Diagnosis not present

## 2024-01-16 DIAGNOSIS — H409 Unspecified glaucoma: Secondary | ICD-10-CM | POA: Insufficient documentation

## 2024-01-16 DIAGNOSIS — Z87891 Personal history of nicotine dependence: Secondary | ICD-10-CM | POA: Diagnosis not present

## 2024-01-16 DIAGNOSIS — C3490 Malignant neoplasm of unspecified part of unspecified bronchus or lung: Secondary | ICD-10-CM

## 2024-01-16 LAB — CBC WITH DIFFERENTIAL (CANCER CENTER ONLY)
Abs Immature Granulocytes: 0.03 10*3/uL (ref 0.00–0.07)
Basophils Absolute: 0.1 10*3/uL (ref 0.0–0.1)
Basophils Relative: 1 %
Eosinophils Absolute: 0.6 10*3/uL — ABNORMAL HIGH (ref 0.0–0.5)
Eosinophils Relative: 6 %
HCT: 42.4 % (ref 39.0–52.0)
Hemoglobin: 14.9 g/dL (ref 13.0–17.0)
Immature Granulocytes: 0 %
Lymphocytes Relative: 23 %
Lymphs Abs: 2 10*3/uL (ref 0.7–4.0)
MCH: 34 pg (ref 26.0–34.0)
MCHC: 35.1 g/dL (ref 30.0–36.0)
MCV: 96.8 fL (ref 80.0–100.0)
Monocytes Absolute: 1.1 10*3/uL — ABNORMAL HIGH (ref 0.1–1.0)
Monocytes Relative: 13 %
Neutro Abs: 5.1 10*3/uL (ref 1.7–7.7)
Neutrophils Relative %: 57 %
Platelet Count: 201 10*3/uL (ref 150–400)
RBC: 4.38 MIL/uL (ref 4.22–5.81)
RDW: 12 % (ref 11.5–15.5)
WBC Count: 8.9 10*3/uL (ref 4.0–10.5)
nRBC: 0 % (ref 0.0–0.2)

## 2024-01-16 LAB — CMP (CANCER CENTER ONLY)
ALT: 11 U/L (ref 0–44)
AST: 17 U/L (ref 15–41)
Albumin: 3.9 g/dL (ref 3.5–5.0)
Alkaline Phosphatase: 43 U/L (ref 38–126)
Anion gap: 5 (ref 5–15)
BUN: 19 mg/dL (ref 8–23)
CO2: 28 mmol/L (ref 22–32)
Calcium: 9 mg/dL (ref 8.9–10.3)
Chloride: 105 mmol/L (ref 98–111)
Creatinine: 0.92 mg/dL (ref 0.61–1.24)
GFR, Estimated: >60 mL/min (ref >60)
Glucose, Bld: 100 mg/dL — ABNORMAL HIGH (ref 70–99)
Potassium: 4.2 mmol/L (ref 3.5–5.1)
Sodium: 138 mmol/L (ref 135–145)
Total Bilirubin: 0.3 mg/dL (ref 0.0–1.2)
Total Protein: 6.6 g/dL (ref 6.5–8.1)

## 2024-01-16 LAB — CEA (ACCESS): CEA (CHCC): 3.02 ng/mL (ref 0.00–5.00)

## 2024-01-16 NOTE — Progress Notes (Signed)
 [facility] [phone]   [phone]  CONSULT NOTE  REFERRING PHYSICIAN: Dr. Vergia Glasgow  REASON FOR CONSULTATION:  68 years old white male recently diagnosed with lung cancer.  HPI Brett [name] is a 68 y.o. male with past medical history significant for osteoarthritis, allergy, chronic back pain, cervical disc disease, paroxysmal SVT, rosacea, scoliosis, spondylolisthesis and long history of smoking.  The patient was enrolled in the CT chest lung cancer screening program and he had CT of the chest on November 21, 2022 that showed multiple pulmonary nodules again noted throughout the lungs the largest of which is in the left lower lobe with a volume greater than mean diameter of 0.7 cm.  He had a PET/CT cardiac perfusion scan performed on 10/03/2023 by his cardiologist and incidentally it showed the left lower lobe pulmonary nodules including a 5 mm which was not readily apparent on the prior lung cancer screening CT.  Repeat CT chest lung cancer screening on 11/23/2023 showed new nodular consolidation in the posterior left lower lobe measuring 1.2 cm.  He also has proximal/mid gastric wall thickening.  A PET scan on 12/22/2023 showed significant abnormal uptake along the left lower lobe nodule described on the previous CT and worrisome for malignant lesion.  There are 2 mediastinal nodes which have some low-level uptake and are small in particular the small node in the AP window has been increasing in size slowly since February 2024.  There was isolated hypermetabolic focus along the deep margin of the left parotid gland suspicious for parotid lesion versus a node.  There was also small focus of uptake along the anterior aspect of the right fifth rib with a small area of sclerosis.  MRI of the neck on 01/08/2024 showed stable T1 hypodense 1.1 cm T1 hypointensity in the inferior left parotid gland at the site of the previously seen hypermetabolic activity on the  PET/CT suspicious for a mass and this could represent a primary parotid neoplasm or mildly enlarged intraparotid lymph node.  On 01/09/2024 the patient underwent video bronchoscopy with robotic assisted bronchoscopic navigation by Dr. Darnelle Elders.  The final cytology ([id number]) of the fine-needle aspiration as well as the bronchial brushing of the left lower lobe lung nodule showed non-small cell carcinoma. Immunohistochemical stains show that the scant tumor cells are positive for cytokeratin AE1/AE3, MOC31, CK7 and CK20 while they are negative for NKX3.1, CK5/6, TTF1 and p63.This immunoprofile is nonspecific but suggestive for a poorly differentiated adenocarcinoma.Differential diagnoses can include upper gastrointestinal and pancreatobiliary, lung, colon and urothelial primaries among other possibilities.  The patient came to the clinic today accompanied by his daughter Brett [name]. Discussed the use of AI scribe software for clinical note transcription with the patient, who gave verbal consent to proceed.  History of Present Illness   Brett [name] is a 68 year old male with lung cancer who presents for evaluation of his condition. He is accompanied by his daughter, Brett [name]. He was referred by his cardiologist after a PET cardiac perfusion scan showed a new nodule in the left lower lobe of the lung.  He was diagnosed with lung cancer following a series of diagnostic tests. During a back surgery in October of the previous year, his heart rate became irregular, leading to a referral to a cardiologist. A PET cardiac perfusion scan in February 2025 revealed a new 5 mm nodule in the left lower lobe of the lung, which was not present in the previous year's CT scan.  A follow-up low-dose CT scan in April 2025 confirmed a 1.2 cm nodule in the left lower lobe. A subsequent PET scan in May 2025 showed increased activity in the nodule, two mediastinal lymph nodes, and the left adrenal gland. A bronchoscopy with  biopsy on January 09, 2024, showed a negative forceps biopsy but a concerning needle biopsy for cancer. The lymph nodes could not be biopsied during the procedure.  He feels 'fantastic' with no symptoms such as chest pain, breathing issues, nausea, vomiting, diarrhea, or headaches. He notes some gas pain, which he attributes to his medication. He has not had an MRI of the brain yet, and a pulmonary function test is pending.  His past medical history includes a congenital hip defect, multiple hip surgeries, scoliosis, and back surgeries due to sciatic nerve issues. He also has supraventricular tachycardia and a reduced ejection fraction of 43%. He uses eye drops for glaucoma and has no history of diabetes, stroke, or heart attack.  His family history includes a mother who had colon cancer and a father with pulmonary fibrosis. He has three children, and his daughter Brett [name] lives with him. He smoked for 53 years but has quit recently with the help of Chantix . He consumes alcohol nightly but denies any drug abuse.      HPI  Past Medical History:  Diagnosis Date   Allergy    Arthritis    Bilateral low back pain without sciatica 09/22/2015   Blood transfusion without reported diagnosis    Cervical neck pain with evidence of disc disease 09/22/2015   Chicken pox    Congenital hip deformity    Corn of foot 08/09/2022   Degenerative disc disease, cervical 01/29/2015   Eczema 05/30/2013   Erectile dysfunction 09/22/2015   Glaucoma 05/30/2013   History of cardiac dysrhythmia 05/23/2023   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 08/06/2023   Pain of left hip joint 11/01/2018   Paroxysmal SVT  (supraventricular tachycardia) (HCC), incidental finding IntraOp back surgery requiring esmolol ; event monitor November 2024 for short runs, longest episode 17 beats. 06/19/2023   Event monitor November 2024, supraventricular ectopy burden 1.7%.  4 short runs of SVT, longest episode 17 beats.  No symptoms reported     Personal history of congenital hip dysplasia 05/30/2013   Preventative health care 01/29/2015   Rhinitis, allergic 09/22/2015   Rosacea 05/30/2013   Scoliosis deformity of spine 05/05/2020   Spinal stenosis of lumbar region 06/16/2023   Spondylolisthesis at L5-S1 level 09/14/2020   Tobacco abuse 05/30/2013    Past Surgical History:  Procedure Laterality Date   ANTERIOR LAT LUMBAR FUSION Right 05/09/2023   Procedure: Extreme Lateral Interbody Fusion Lumbar three-four -right;  Surgeon: Agustina Aldrich, MD;  Location: [facility];  Service: Neurosurgery;  Laterality: Right;  3C   BRONCHOSCOPY, WITH BIOPSY USING ELECTROMAGNETIC NAVIGATION Bilateral 01/09/2024   Procedure: ROBOTIC ASSISTED NAVIGATIONAL BRONCHOSCOPY;  Surgeon: Vergia Glasgow, MD;  Location: [facility] ORS;  Service: Pulmonary;  Laterality: Bilateral;   COLONOSCOPY  06/17/2015   Pyrtle   COLONOSCOPY  07/20/2020   Pyrtle   ENDOBRONCHIAL ULTRASOUND Bilateral 01/09/2024   Procedure: ENDOBRONCHIAL ULTRASOUND (EBUS);  Surgeon: Vergia Glasgow, MD;  Location: [facility] ORS;  Service: Pulmonary;  Laterality: Bilateral;  HAMMER TOE SURGERY  07/28/2011   Procedure: HAMMER TOE CORRECTION;  Surgeon: Ferd Householder, MD;  Location: [facility];  Service: Orthopedics;  Laterality: Right;  right 2nd and 4th toes correction hammer toe, capsulotomy metatarsal-phalangeal joints   HERNIA REPAIR     HIP SURGERY  1986 & 2010   rt total hip-8/10-multiple rt hip surgeries- had 4 prior to 1986 as a child   JOINT REPLACEMENT  2010   lumber fusion  09/2020   POLYPECTOMY     SPINE SURGERY  09/14/2020   THUMB ARTHROSCOPY  2008   rt    Family  History  Problem Relation Age of Onset   Cancer Mother 48       history of colon cancer   Rosacea Mother    Colon cancer Mother 29   Colon polyps Mother    Rosacea Father    Lung disease Father        ?pulmonary fibrosis   Alcohol abuse Brother    Depression Brother    Colon cancer Maternal Grandmother    Endocrine tumor Daughter        pituitary tumor, POTTS   Thyroid  disease Son        ?hyperthyroid   Cancer Cousin        colon   Colon cancer Cousin    Esophageal cancer Neg Hx    Rectal cancer Neg Hx    Stomach cancer Neg Hx     Social History Social History   Tobacco Use   Smoking status: Former    Current packs/day: 0.00    Average packs/day: 0.5 packs/day for 50.3 years (25.2 ttl pk-yrs)    Types: Cigarettes    Start date: 27    Quit date: 12/11/2023    Years since quitting: 0.0   Smokeless tobacco: Never   Tobacco comments:    Started smoking at 68 years old.    Smoked 1 PPD at his heaviest.    Quit smoking on 12/11/2023  Vaping Use   Vaping status: Never Used  Substance Use Topics   Alcohol use: Yes    Alcohol/week: 10.0 standard drinks of alcohol    Types: 10 Standard drinks or equivalent per week   Drug use: No    No Known Allergies  Current Outpatient Medications  Medication Sig Dispense Refill   aspirin  EC 81 MG tablet Take 1 tablet (81 mg total) by mouth daily. Swallow whole.     calcium  carbonate (TUMS EX) 750 MG chewable tablet Chew 1-2 tablets by mouth daily as needed for heartburn.     empagliflozin  (JARDIANCE ) 10 MG TABS tablet Take 1 tablet (10 mg total) by mouth daily before breakfast. 90 tablet 1   metoprolol  succinate (TOPROL  XL) 25 MG 24 hr tablet Take 1 tablet (25 mg total) by mouth daily. 90 tablet 3   Multiple Vitamin (MULTIVITAMIN) tablet Take 1 tablet by mouth daily. Centrum silver     Oxycodone  HCl 10 MG TABS Take 1 tablet (10 mg total) by mouth every 6 (six) hours as needed (Pain). 30 tablet 0   sacubitril-valsartan (ENTRESTO )  24-26 MG Take 1 tablet by mouth 2 (two) times daily. 180 tablet 2   STELARA 45 MG/0.5ML injection Inject 45 mg into the skin as directed. Every 12 weeks     timolol  (TIMOPTIC ) 0.5 % ophthalmic solution Place 1 drop into both eyes 2 (two) times daily.     TRAVATAN Z 0.004 % SOLN ophthalmic solution Place 1 drop  into both eyes every morning.  1   Ustekinumab (STELARA IV) Inject into the vein. (Patient not taking: Reported on 01/04/2024)     varenicline  (CHANTIX  CONTINUING MONTH PAK) 1 MG tablet Take 1 tablet (1 mg total) by mouth 2 (two) times daily. 60 tablet 1   No current facility-administered medications for this visit.    Review of Systems  Constitutional: positive for fatigue Eyes: negative Ears, nose, mouth, throat, and face: negative Respiratory: negative Cardiovascular: negative Gastrointestinal: negative Genitourinary:negative Integument/breast: negative Hematologic/lymphatic: negative Musculoskeletal:positive for back pain Neurological: negative Behavioral/Psych: negative Endocrine: negative Allergic/Immunologic: negative  Physical Exam  ZOX:WRUEA, healthy, no distress, well nourished, and well developed SKIN: skin color, texture, turgor are normal, no rashes or significant lesions HEAD: Normocephalic, No masses, lesions, tenderness or abnormalities EYES: normal, PERRLA, Conjunctiva are pink and non-injected EARS: External ears normal, Canals clear OROPHARYNX:no exudate, no erythema, and lips, buccal mucosa, and tongue normal  NECK: supple, no adenopathy, no JVD LYMPH:  no palpable lymphadenopathy, no hepatosplenomegaly LUNGS: clear to auscultation , and palpation HEART: regular rate & rhythm, no murmurs, and no gallops ABDOMEN:abdomen soft, non-tender, normal bowel sounds, and no masses or organomegaly BACK: Back symmetric, no curvature., No CVA tenderness EXTREMITIES:no joint deformities, effusion, or inflammation, no edema  NEURO: alert & oriented x 3 with fluent  speech, no focal motor/sensory deficits  PERFORMANCE STATUS: ECOG 1  LABORATORY DATA: Lab Results  Component Value Date   WBC 8.1 01/08/2024   HGB 14.8 01/08/2024   HCT 44.0 01/08/2024   MCV 98.4 01/08/2024   PLT 172 01/08/2024      Chemistry      Component Value Date/Time   NA 138 12/21/2023 0918   K 4.9 12/21/2023 0918   CL 100 12/21/2023 0918   CO2 25 12/21/2023 0918   BUN 15 12/21/2023 0918   CREATININE 0.87 12/21/2023 0918      Component Value Date/Time   CALCIUM  9.4 12/21/2023 0918   ALKPHOS 49 12/06/2023 1456   AST 25 12/06/2023 1456   ALT 16 12/06/2023 1456   BILITOT 0.6 12/06/2023 1456       RADIOGRAPHIC STUDIES: DG Chest Port 1 View Result Date: 01/09/2024 CLINICAL DATA:  Status post bronchoscopy EXAM: PORTABLE CHEST 1 VIEW COMPARISON:  September 22, 2022 FINDINGS: The heart size and mediastinal contours are within normal limits. Both lungs are clear. The visualized skeletal structures are unremarkable. IMPRESSION: No active disease. Electronically Signed   By: Fredrich Jefferson M.D.   On: 01/09/2024 14:07   DG C-Arm 1-60 Min-No Report Result Date: 01/09/2024 Fluoroscopy was utilized by the requesting physician.  No radiographic interpretation.   MR NECK SOFT TISSUE ONLY W WO CONTRAST Result Date: 01/08/2024 CLINICAL DATA:  Parotid region mass EXAM: MRI OF THE NECK WITH CONTRAST TECHNIQUE: Multiplanar, multisequence MR imaging was performed following the administration of intravenous contrast. CONTRAST:  7mL GADAVIST  GADOBUTROL  1 MMOL/ML IV SOLN COMPARISON:  None Available. FINDINGS: Pharynx and larynx: Salivary glands: Subtle T1 hypointense 1.1 cm T1 hypointensity in the inferior left parotid gland at the site of previously seen hypermetabolic activity on PET CT (series 3, image 9), suspicious for a mass. Otherwise, the parotid glands and submandibular glands are within normal limits. Thyroid : Unremarkable. Lymph nodes: Aside from the possible intraparotid lymph node  described above, no pathologically enlarged lymph nodes in the neck. Limited intracranial: Unremarkable. Visualized orbits: Not imaged. Mastoids and visualized paranasal sinuses: Clear. Skeleton: Degenerative/discogenic endplate signal changes at C6-C7. Upper chest: Clear. IMPRESSION: Subtle T1  hypointense 1.1 cm T1 hypointensity in the inferior left parotid gland at the site of previously seen hypermetabolic activity on PET CT, suspicious for a mass. This could represent a primary parotid neoplasm or mildly enlarged intraparotid lymph node. Recommend ENT consultation for management. Electronically Signed   By: Stevenson Elbe M.D.   On: 01/08/2024 20:11   NM PET Image Initial (PI) Skull Base To Thigh (F-18 FDG) Result Date: 12/22/2023 CLINICAL DATA:  Initial treatment strategy for lung nodule. EXAM: NUCLEAR MEDICINE PET SKULL BASE TO THIGH TECHNIQUE: 7.57 mCi F-18 FDG was injected intravenously. Full-ring PET imaging was performed from the skull base to thigh after the radiotracer. CT data was obtained and used for attenuation correction and anatomic localization. Fasting blood glucose: 95 mg/dl COMPARISON:  Lung cancer screening CT 11/23/2023 and older FINDINGS: Mediastinal blood pool activity: SUV max 2.1 Liver activity: SUV max 2.4 NECK: Focus of abnormal uptake with maximum SUV value of 6.9 corresponding to a focus deep to the left parotid gland. Parotid primary lesion versus a abnormal node is possible. Focus on image 26 of series 4 measures 14 mm. No additional areas of abnormal uptake in the neck including along other lymph node chains such as submandibular, posterior triangle or internal jugular regions. Near symmetric uptake of tracer along the visualized intracranial compartment. Incidental CT findings: Mild mucosal thickening along the maxillary sinuses, right greater than left. Also some mucosal thickening along the anterior ethmoid air cells and sphenoid sinuses. The mastoid air cells are clear.  Streak artifact related to the patient's dental hardware. The right parotid gland, the submandibular glands and thyroid  gland are grossly unremarkable. Only mild heterogeneity of the thyroid  gland without abnormal uptake. CHEST: No specific abnormal uptake seen above blood pool in the axillary region or hila. Slight asymmetric uptake identified along lymph node in the AP window with maximum SUV of 3.8. This node is small on image 88 with short axis diameter 7 mm. However this node has been slowly increasing in size from exams going back to April 2024. Additional area of uptake maximum SUV of 3.5 along a small node medially anterior to the top of the aortic arch as seen on image 76. This node is only 3 mm. Recommend follow up surveillance. Left lower lobe juxtapleural nodule identified once again. On prior CT this measured 12 mm and today on image 107 this measures 16 x 9 mm. Uptake has maximum SUV of 6.2. No additional areas of abnormal uptake along the lung parenchyma. Incidental CT findings: Emphysematous lung changes identified diffusely. No pleural effusion or pneumothorax. Minimal ground-glass laterally along the left lower lobe. Prominent coronary artery calcifications are seen. Heart is not enlarged. No pericardial effusion. The thoracic aorta is normal course and caliber with scattered partially calcified plaque. Right-sided apical pleural calcifications and thickening are identified. ABDOMEN/PELVIS: There is physiologic distribution radiotracer along the parenchymal organs, bowel and renal collecting systems. Incidental CT findings: Extensive vascular calcifications identified along the aorta and iliac vessels. Some branch vessel calcification as well. Grossly the liver, spleen, adrenal glands and pancreas are unremarkable. Gallbladder is nondilated. No abnormal calcifications seen within either kidney nor along the course of either ureter. Contracted urinary bladder. Moderate diffuse colonic stool.  Left-sided colonic diverticula. The stomach and small bowel are nondilated. No obvious free air or free fluid. SKELETON: There is a focus of abnormal uptake along the anterior right ribcage measuring 4.4 maximum SUV. There is some subtle sclerosis in this location along the anterior aspect of  the right fifth rib on image 106. There is also some minimal uptake along the eighth rib margin. Nonspecific. Maximum SUV of 3.9. There is some additional uptake noted in the lumbar spine at the disc space at L3-4 with there is a discs metallic spacer. There is extensive hardware along the lumbar spine from L3 through S1. Curvature of spine with prominent degenerative changes. Osteopenia right hip arthroplasty with streak artifact. Degenerative changes of the left hip. IMPRESSION: Significant abnormal uptake along the left lower lobe nodule described on the prior CT scan worrisome for malignant lesion. There are 2 mediastinal nodes which have some low-level uptake and are small but in particular the small node in the AP window has been increasing in size slowly going back to study of April 2024. Recommend attention on short follow-up. Isolated hypermetabolic focus along the deep margin of the left parotid gland. This could be a parotid lesion versus a node. Recommend dedicated imaging of the neck region such as CT or MRI. Small focus of uptake along the anterior aspect the right fifth rib with a small area of sclerosis. This would have a broad differential. There is also slight uptake along the anterior aspect of the eighth rib. Aggressive process is possible. Asymmetric uptake involving the metallic disc spacer at the L3-4 level of the spine. There is extensive degenerative changes along the lower lumbar spine and hardware fixation. Please correlate with any symptoms. Electronically Signed   By: Adrianna Horde M.D.   On: 12/22/2023 15:51    ASSESSMENT AND PLAN: Assessment and Plan    Lung cancer Adenocarcinoma of the left  lower lobe diagnosed. Initial CT on November 21, 2022, showed pulmonary nodules; a new 1.2 cm nodule identified on November 23, 2023. PET scan on Dec 22, 2023, indicated increased activity in the left lower lobe nodule and two mediastinal lymph nodes. Biopsy confirmed adenocarcinoma. Differential diagnosis includes other potential primary sites, but lung is the most likely source. Staging uncertain due to lymph node involvement; could be stage 1A or stage 2B. No symptoms such as chest pain or dyspnea. If lymph nodes are negative, stage 1; if positive, stage 2B. Surgical resection preferred if pulmonary function allows; otherwise, chemotherapy and radiation are alternatives. Goal is curative treatment. - Order MRI of the brain - Schedule pulmonary function test - Refer to surgeon Dr. Luna Salinas for evaluation on January 31, 2024 - Consider upper endoscopy to rule out gastrointestinal involvement - Coordinate with gastroenterologist for potential upper endoscopy and colonoscopy  Heart failure with reduced ejection fraction Ejection fraction decreased to 43%. SVT present with regular heart rate. No current symptoms.  Supraventricular tachycardia (SVT) SVT with regular heart rate. No current symptoms.  Scoliosis Scoliosis secondary to congenital hip defect and leg length discrepancy. No current symptoms.  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 No current symptoms.  Glaucoma Glaucoma managed with eye drops. No current symptoms.   The patient was advised to call immediately if he has any concerning symptoms in the interval. The patient voices understanding of current disease status and treatment options and is in agreement with the current care plan.  All questions were answered. The patient knows to call the clinic with any problems, questions or concerns. We can certainly see the patient much sooner if necessary.  Thank you so much for allowing  me to participate in the care of Brett [name]. I will continue to follow up the patient with you and assist in  his care. The total time spent in the appointment was 60 minutes including review of chart and various tests results, discussions about plan of care and coordination of care plan .  Disclaimer: This note was dictated with voice recognition software. Similar sounding words can inadvertently be transcribed and may not be corrected upon review.   Aurelio Blower January 16, 2024, 1:38 PM

## 2024-01-16 NOTE — Telephone Encounter (Signed)
 Please schedule IR consult. Thank you!

## 2024-01-16 NOTE — Telephone Encounter (Signed)
 Please schedule PFT as soon as possible. Thank you!

## 2024-01-16 NOTE — Telephone Encounter (Signed)
-----   Message from [place] [name] sent at 01/15/2024  8:39 PM EDT ----- Biopsy of the lung nodule is showing malignancy. I called the patient and informed him of the results. I've also placed a referral to oncology and thoracic surgery. He will need PFT's done before he sees thoracic surgery. Could you please schedule that for him (at our [address] office) as soon as possible.  Thank you Brett [name]

## 2024-01-16 NOTE — Telephone Encounter (Signed)
-----   Message from Eugenio Saenz [name] sent at 01/15/2024  8:40 PM EDT ----- Patient with parotid gland nodule that needs biopsy. IR consult placed. Please follow up to ensure consult is placed appropriately.  Thank you habib

## 2024-01-17 ENCOUNTER — Ambulatory Visit (HOSPITAL_COMMUNITY)
Admission: RE | Admit: 2024-01-17 | Discharge: 2024-01-17 | Disposition: A | Discharge status: Home / Self Care | Source: Ambulatory Visit | Attending: Student in an Organized Health Care Education/Training Program | Admitting: Student in an Organized Health Care Education/Training Program

## 2024-01-17 DIAGNOSIS — C3432 Malignant neoplasm of lower lobe, left bronchus or lung: Secondary | ICD-10-CM | POA: Insufficient documentation

## 2024-01-17 LAB — PULMONARY FUNCTION TEST
DL/VA % pred: 34 %
DL/VA: 1.38 ml/min/mmHg/L
DLCO cor % pred: 40 %
DLCO cor: 11.9 ml/min/mmHg
DLCO unc % pred: 40 %
DLCO unc: 12 ml/min/mmHg
FEF 25-75 Post: 0.81 L/s
FEF 25-75 Pre: 0.63 L/s
FEF2575-%Change-Post: 30 %
FEF2575-%Pred-Post: 27 %
FEF2575-%Pred-Pre: 21 %
FEV1-%Change-Post: 12 %
FEV1-%Pred-Post: 59 %
FEV1-%Pred-Pre: 53 %
FEV1-Post: 2.28 L
FEV1-Pre: 2.03 L
FEV1FVC-%Change-Post: 3 %
FEV1FVC-%Pred-Pre: 55 %
FEV6-%Change-Post: 7 %
FEV6-%Pred-Post: 90 %
FEV6-%Pred-Pre: 84 %
FEV6-Post: 4.43 L
FEV6-Pre: 4.13 L
FEV6FVC-%Change-Post: -1 %
FEV6FVC-%Pred-Post: 85 %
FEV6FVC-%Pred-Pre: 87 %
FVC-%Change-Post: 8 %
FVC-%Pred-Post: 105 %
FVC-%Pred-Pre: 96 %
FVC-Post: 5.43 L
FVC-Pre: 4.99 L
Post FEV1/FVC ratio: 42 %
Post FEV6/FVC ratio: 82 %
Pre FEV1/FVC ratio: 41 %
Pre FEV6/FVC Ratio: 83 %
RV % pred: 190 %
RV: 5.05 L
TLC % pred: 138 %
TLC: 10.86 L

## 2024-01-17 LAB — CANCER ANTIGEN 19-9: CA 19-9: 48 U/mL — ABNORMAL HIGH (ref 0–35)

## 2024-01-17 MED ORDER — ALBUTEROL SULFATE (2.5 MG/3ML) 0.083% IN NEBU
2.5000 mg | INHALATION_SOLUTION | Freq: Once | RESPIRATORY_TRACT | Status: AC
  Administered 2024-01-17: 2.5 mg via RESPIRATORY_TRACT

## 2024-01-17 NOTE — Progress Notes (Signed)
 Erica Hau, MD  Joelle Musca OK for US  guided FNA of left parotid lesion w/ local. Deep in gland by PET and subtle by MRI, so may not even be an actual lesion to sample. Confirm by US  at time of procedure.  GY       Previous Messages    ----- Message ----- From: Kamaal Cast Sent: 01/16/2024  11:54 AM EDT To: [place]; Ir Procedure Requests Subject: IR Radiologist Eval & Mgmt - Ct Biopsy ?      Procedure : CT Biopsy  Reason: biopsy of PET avid parotid gland nodule Dx: Nodule of parotid gland [K11.8 (ICD-10-CM)]    History :MR neck soft tissue only w/wo , CT super d chest w/o , NM Pet initial skull base to thigh , CT chest lung cancer screening , NM PET cardiac perf.  Provider : Vergia Glasgow, MD  Contact : [phone]

## 2024-01-18 ENCOUNTER — Ambulatory Visit: Admitting: Student in an Organized Health Care Education/Training Program

## 2024-01-23 ENCOUNTER — Encounter: Payer: Self-pay | Admitting: Gastroenterology

## 2024-01-23 ENCOUNTER — Ambulatory Visit: Admitting: Gastroenterology

## 2024-01-23 ENCOUNTER — Ambulatory Visit (HOSPITAL_COMMUNITY)
Admission: RE | Admit: 2024-01-23 | Discharge: 2024-01-23 | Disposition: A | Discharge status: Home / Self Care | Source: Ambulatory Visit | Attending: Internal Medicine | Admitting: Internal Medicine

## 2024-01-23 VITALS — BP 116/74 | HR 70 | Ht 74.0 in | Wt 164.0 lb

## 2024-01-23 DIAGNOSIS — I5020 Unspecified systolic (congestive) heart failure: Secondary | ICD-10-CM | POA: Diagnosis not present

## 2024-01-23 DIAGNOSIS — C3432 Malignant neoplasm of lower lobe, left bronchus or lung: Secondary | ICD-10-CM

## 2024-01-23 DIAGNOSIS — J324 Chronic pansinusitis: Secondary | ICD-10-CM | POA: Diagnosis not present

## 2024-01-23 DIAGNOSIS — G319 Degenerative disease of nervous system, unspecified: Secondary | ICD-10-CM | POA: Diagnosis not present

## 2024-01-23 DIAGNOSIS — Z87891 Personal history of nicotine dependence: Secondary | ICD-10-CM | POA: Diagnosis not present

## 2024-01-23 DIAGNOSIS — Z860101 Personal history of adenomatous and serrated colon polyps: Secondary | ICD-10-CM

## 2024-01-23 DIAGNOSIS — Z860100 Personal history of colon polyps, unspecified: Secondary | ICD-10-CM

## 2024-01-23 DIAGNOSIS — Z800 Family history of malignant neoplasm of digestive organs: Secondary | ICD-10-CM

## 2024-01-23 DIAGNOSIS — C3490 Malignant neoplasm of unspecified part of unspecified bronchus or lung: Secondary | ICD-10-CM | POA: Diagnosis not present

## 2024-01-23 MED ORDER — GADOBUTROL 1 MMOL/ML IV SOLN
7.0000 mL | Freq: Once | INTRAVENOUS | Status: AC | PRN
  Administered 2024-01-23: 7 mL via INTRAVENOUS

## 2024-01-23 MED ORDER — NA SULFATE-K SULFATE-MG SULF 17.5-3.13-1.6 GM/177ML PO SOLN: 1.0000 | ORAL | 0 refills | Status: DC

## 2024-01-23 NOTE — Patient Instructions (Signed)
 You have been scheduled for an endoscopy and colonoscopy. Please follow the written instructions given to you at your visit today.  If you use inhalers (even only as needed), please bring them with you on the day of your procedure.  _______________________________________________________  If your blood pressure at your visit was 140/90 or greater, please contact your primary care physician to follow up on this.  _______________________________________________________  If you are age 39 or older, your body mass index should be between 23-30. Your Body mass index is 21.06 kg/m. If this is out of the aforementioned range listed, please consider follow up with your Primary Care Provider.  If you are age 5 or younger, your body mass index should be between 19-25. Your Body mass index is 21.06 kg/m. If this is out of the aformentioned range listed, please consider follow up with your Primary Care Provider.   ________________________________________________________  The [facility] providers would like to encourage you to use MYCHART to communicate with providers for non-urgent requests or questions.  Due to long hold times on the telephone, sending your provider a message by [facility] may be a faster and more efficient way to get a response.  Please allow 48 business hours for a response.  Please remember that this is for non-urgent requests.  _______________________________________________________  I appreciate the opportunity to care for you. Bayley Baker Bon, PA____________________

## 2024-01-23 NOTE — Progress Notes (Signed)
 Chief Complaint: EGD/Colonoscopy Primary GI MD: Dr. 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  Following with oncology.  Discussing surgical resection versus chemo/radiation.  Scheduled to see surgeon January 31, 2024.  Referred here by Dr. Liam Redhead with oncology for upper endoscopy and colonoscopy to rule out gastrointestinal involvement  Discussed the use of AI scribe software for clinical note transcription with the patient, who gave verbal consent to proceed.  History of Present Illness He was recently diagnosed with lung cancer following a needle biopsy. The initial suspicion arose after a pulmonologist identified a spot on an MRI, initially thought to be an infection. He underwent a brain MRI and a PET scan from eyebrow to thigh,. He is set to meet with a surgeon on the 25th to discuss potential surgical resection of the lung.  He is due for a colonoscopy next month, with an endoscopy to be added to the procedure.    He is currently on three heart medications and has been experiencing gas pain, which he suspects may be related to these medications.   His past medical history includes glaucoma, for which he was previously only taking eye drops. He denies being on any blood thinners. His family history includes his husband's best friend's father who had lung cancer.   PREVIOUS GI WORKUP   Colonoscopy 02/2021 - A tattoo was seen in the proximal ascending colon. A post- polypectomy scar was found at the tattoo site. There was no evidence of residual polyp tissue.   - Diverticulosis in the sigmoid colon, in the descending colon and in the ascending colon.  - Small internal hemorrhoids. - No specimens collected. - Repeat 3 years (02/2024)  Colonoscopy 07/2020 - Three 4 to 6 mm polyps in the cecum, removed with a cold snare. Resected and retrieved.  - One 15 mm polyp in the proximal ascending colon, removed piecemeal using a cold snare. Resected and retrieved. Tattooed.  - Two 3 to 5 mm polyps in the ascending colon, removed with a cold snare. Resected andretrieved.  - Diverticulosis in the sigmoid colon, in the descending colon and in the ascending colon.  - Internal hemorrhoids.  Diagnosis Surgical [P], colon, ascending, cecum, polyps (6) - MULTIPLE FRAGMENTS OF TUBULAR ADENOMA(S) - SESSILE SERRATED POLYP (1 FRAGMENTS) - NO HIGH GRADE DYSPLASIA OR MALIGNANCY IDENTIFIED  Past Medical History:  Diagnosis Date   Allergy    Arthritis    Bilateral low back pain without sciatica 09/22/2015   Blood transfusion without reported diagnosis    Cervical neck pain with evidence of disc disease 09/22/2015   Chicken pox    Congenital hip deformity    Corn of foot 08/09/2022   Degenerative disc disease, cervical 01/29/2015   Eczema 05/30/2013   Erectile dysfunction 09/22/2015   Glaucoma 05/30/2013   History of cardiac dysrhythmia 05/23/2023   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 08/06/2023  Pain of left hip joint 11/01/2018   Paroxysmal SVT (supraventricular tachycardia) (HCC), incidental finding IntraOp back surgery requiring esmolol ; event monitor November 2024 for short runs, longest episode 17 beats. 06/19/2023   Event monitor November  2024, supraventricular ectopy burden 1.7%.  4 short runs of SVT, longest episode 17 beats.  No symptoms reported     Personal history of congenital hip dysplasia 05/30/2013   Preventative health care 01/29/2015   Rhinitis, allergic 09/22/2015   Rosacea 05/30/2013   Scoliosis deformity of spine 05/05/2020   Spinal stenosis of lumbar region 06/16/2023   Spondylolisthesis at L5-S1 level 09/14/2020   Tobacco abuse 05/30/2013    Past Surgical History:  Procedure Laterality Date   ANTERIOR LAT LUMBAR FUSION Right 05/09/2023   Procedure: Extreme Lateral Interbody Fusion Lumbar three-four -right;  Surgeon: Agustina Aldrich, MD;  Location: [facility] OR;  Service: Neurosurgery;  Laterality: Right;  3C   BRONCHOSCOPY, WITH BIOPSY USING ELECTROMAGNETIC NAVIGATION Bilateral 01/09/2024   Procedure: ROBOTIC ASSISTED NAVIGATIONAL BRONCHOSCOPY;  Surgeon: Vergia Glasgow, MD;  Location: [facility] ORS;  Service: Pulmonary;  Laterality: Bilateral;   COLONOSCOPY  06/17/2015   Pyrtle   COLONOSCOPY  07/20/2020   Pyrtle   ENDOBRONCHIAL ULTRASOUND Bilateral 01/09/2024   Procedure: ENDOBRONCHIAL ULTRASOUND (EBUS);  Surgeon: Vergia Glasgow, MD;  Location: [facility] ORS;  Service: Pulmonary;  Laterality: Bilateral;   HAMMER TOE SURGERY  07/28/2011   Procedure: HAMMER TOE CORRECTION;  Surgeon: Ferd Householder, MD;  Location: [facility];  Service: Orthopedics;  Laterality: Right;  right 2nd and 4th toes correction hammer toe, capsulotomy metatarsal-phalangeal joints   HERNIA REPAIR     HIP SURGERY  1986 & 2010   rt total hip-8/10-multiple rt hip surgeries- had 4 prior to 1986 as a child   JOINT REPLACEMENT  2010   lumber fusion  09/2020   POLYPECTOMY     SPINE SURGERY  09/14/2020   THUMB ARTHROSCOPY  2008   rt    Current Outpatient Medications  Medication Sig Dispense Refill   aspirin  EC 81 MG tablet Take 1 tablet (81 mg total) by mouth daily. Swallow whole.     calcium  carbonate (TUMS EX) 750 MG chewable tablet Chew 1-2  tablets by mouth daily as needed for heartburn.     empagliflozin  (JARDIANCE ) 10 MG TABS tablet Take 1 tablet (10 mg total) by mouth daily before breakfast. 90 tablet 1   metoprolol  succinate (TOPROL  XL) 25 MG 24 hr tablet Take 1 tablet (25 mg total) by mouth daily. 90 tablet 3   Multiple Vitamin (MULTIVITAMIN) tablet Take 1 tablet by mouth daily. Centrum silver     Na Sulfate-K Sulfate-Mg Sulfate concentrate (SUPREP) 17.5-3.13-1.6 GM/177ML SOLN Take 1 kit (354 mLs total) by mouth as directed. 354 mL 0   Oxycodone  HCl 10 MG TABS Take 1 tablet (10 mg total) by mouth every 6 (six) hours as needed (Pain). 30 tablet 0   sacubitril-valsartan (ENTRESTO ) 24-26 MG Take 1 tablet by mouth 2 (two) times daily. 180 tablet 2   STELARA 45 MG/0.5ML injection Inject 45 mg into the skin as directed. Every 12 weeks     timolol  (TIMOPTIC ) 0.5 % ophthalmic solution Place 1 drop into both eyes 2 (two) times daily.     TRAVATAN Z 0.004 % SOLN ophthalmic solution Place 1 drop into both eyes every morning.  1   varenicline  (CHANTIX  CONTINUING MONTH PAK) 1 MG tablet Take 1 tablet (1 mg total) by mouth 2 (two) times daily. 60 tablet 1  Ustekinumab (STELARA IV) Inject into the vein. (Patient not taking: Reported on 01/04/2024)     No current facility-administered medications for this visit.    Allergies as of 01/23/2024   (No Known Allergies)    Family History  Problem Relation Age of Onset   Cancer Mother 74       history of colon cancer   Rosacea Mother    Colon cancer Mother 76   Colon polyps Mother    Rosacea Father    Lung disease Father        ?pulmonary fibrosis   Alcohol abuse Brother    Depression Brother    Colon cancer Maternal Grandmother    Endocrine tumor Daughter        pituitary tumor, POTTS   Thyroid  disease Son        ?hyperthyroid   Cancer Cousin        colon   Colon cancer Cousin    Esophageal cancer Neg Hx    Rectal cancer Neg Hx    Stomach cancer Neg Hx     Social History    Socioeconomic History   Marital status: Widowed    Spouse name: Not on file   Number of children: Not on file   Years of education: Not on file   Highest education level: Associate degree: occupational, Scientist, product/process development, or vocational program  Occupational History   Not on file  Tobacco Use   Smoking status: Former    Current packs/day: 0.00    Average packs/day: 0.5 packs/day for 50.3 years (25.2 ttl pk-yrs)    Types: Cigarettes    Start date: 30    Quit date: 12/11/2023    Years since quitting: 0.1   Smokeless tobacco: Never   Tobacco comments:    Started smoking at 68 years old.    Smoked 1 PPD at his heaviest.    Quit smoking on 12/11/2023  Vaping Use   Vaping status: Never Used  Substance and Sexual Activity   Alcohol use: Yes    Alcohol/week: 10.0 standard drinks of alcohol    Types: 10 Standard drinks or equivalent per week   Drug use: No   Sexual activity: Not Currently    Birth control/protection: None  Other Topics Concern   Not on file  Social History Narrative   Quality control Tech- gauges/callibrations.  (Retired)   Some colleg/tech school   wife passed   3 grown children (oldest daughter is living with them) youngest daughter lives in [place].  Curator at [employer].  Son lives near [place]- [place].   Social Drivers of Health   Financial Resource Strain: Medium Risk (12/03/2023)   Overall Financial Resource Strain (CARDIA)    Difficulty of Paying Living Expenses: Somewhat hard  Food Insecurity: No Food Insecurity (01/16/2024)   Hunger Vital Sign    Worried About Running Out of Food in the Last Year: Never true    Ran Out of Food in the Last Year: Never true  Transportation Needs: No Transportation Needs (01/16/2024)   PRAPARE - Administrator, Civil Service (Medical): No    Lack of Transportation (Non-Medical): No  Physical Activity: Inactive (12/03/2023)   Exercise Vital Sign    Days of Exercise per Week: 0 days    Minutes of  Exercise per Session: 10 min  Stress: No Stress Concern Present (12/03/2023)   [employer] of Occupational Health - Occupational Stress Questionnaire    Feeling of Stress : Not at all  Social Connections: Moderately Integrated (12/03/2023)   Social Connection and Isolation Panel    Frequency of Communication with Friends and Family: Three times a week    Frequency of Social Gatherings with Friends and Family: Once a week    Attends Religious Services: More than 4 times per year    Active Member of [employer] or Organizations: Yes    Attends Banker Meetings: More than 4 times per year    Marital Status: Widowed  Intimate Partner Violence: Not At Risk (01/16/2024)   Humiliation, Afraid, Rape, and Kick questionnaire    Fear of Current or Ex-Partner: No    Emotionally Abused: No    Physically Abused: No    Sexually Abused: No    Review of Systems:    Constitutional: No weight loss, fever, chills, weakness or fatigue HEENT: Eyes: No change in vision               Ears, Nose, Throat:  No change in hearing or congestion Skin: No rash or itching Cardiovascular: No chest pain, chest pressure or palpitations   Respiratory: No SOB or cough Gastrointestinal: See HPI and otherwise negative Genitourinary: No dysuria or change in urinary frequency Neurological: No headache, dizziness or syncope Musculoskeletal: No new muscle or joint pain Hematologic: No bleeding or bruising Psychiatric: No history of depression or anxiety    Physical Exam:  Vital signs: BP 116/74   Pulse 70   Ht 6' 2 (1.88 m)   Wt 164 lb (74.4 kg)   SpO2 97%   BMI 21.06 kg/m   Constitutional: NAD, alert and cooperative. Frail and thin. Walks with walker. Able to get on exam table without difficulty  Head:  Normocephalic and atraumatic. Eyes:   PEERL, EOMI. No icterus. Conjunctiva pink. Respiratory: Respirations even and unlabored. Lungs clear to auscultation bilaterally.   No wheezes, crackles, or  rhonchi.  Cardiovascular:  Regular rate and rhythm. No peripheral edema, cyanosis or pallor.  Gastrointestinal:  Soft, nondistended, nontender. No rebound or guarding. Normal bowel sounds. No appreciable masses or hepatomegaly. Rectal:  Declines Msk:  Symmetrical without gross deformities. Without edema, no deformity or joint abnormality.  Neurologic:  Alert and  oriented x4;  grossly normal neurologically.  Skin:   Dry and intact without significant lesions or rashes. Psychiatric: Oriented to person, place and time. Demonstrates good judgement and reason without abnormal affect or behaviors.  RELEVANT LABS AND IMAGING: CBC    Component Value Date/Time   WBC 8.9 01/16/2024 1354   WBC 8.1 01/08/2024 0900   RBC 4.38 01/16/2024 1354   HGB 14.9 01/16/2024 1354   HGB 15.5 10/11/2023 0932   HCT 42.4 01/16/2024 1354   HCT 44.3 10/11/2023 0932   PLT 201 01/16/2024 1354   MCV 96.8 01/16/2024 1354   MCH 34.0 01/16/2024 1354   MCHC 35.1 01/16/2024 1354   RDW 12.0 01/16/2024 1354   LYMPHSABS 2.0 01/16/2024 1354   MONOABS 1.1 (H) 01/16/2024 1354   EOSABS 0.6 (H) 01/16/2024 1354   BASOSABS 0.1 01/16/2024 1354    CMP     Component Value Date/Time   NA 138 01/16/2024 1354   NA 138 12/21/2023 0918   K 4.2 01/16/2024 1354   CL 105 01/16/2024 1354   CO2 28 01/16/2024 1354   GLUCOSE 100 (H) 01/16/2024 1354   BUN 19 01/16/2024 1354   BUN 15 12/21/2023 0918   CREATININE 0.92 01/16/2024 1354   CALCIUM  9.0 01/16/2024 1354   PROT 6.6 01/16/2024 1354  ALBUMIN 3.9 01/16/2024 1354   AST 17 01/16/2024 1354   ALT 11 01/16/2024 1354   ALKPHOS 43 01/16/2024 1354   BILITOT 0.3 01/16/2024 1354   GFRNONAA >60 01/16/2024 1354   GFRAA  03/19/2009 0432    >60        The eGFR has been calculated using the MDRD equation. This calculation has not been validated in all clinical situations. eGFR's persistently <60 mL/min signify possible Chronic Kidney Disease.     Assessment/Plan:   History  of colon polyps Last colonoscopy 02/2021 with no colon polyps.  History of 15 mm polyp near diverticulum in ascending colon removed December 2021 with personal history of adenomatous and SSP's as well as family history of colon cancer in first-degree relative. - Due for repeat 02/2024 - Schedule colonoscopy in [facility] - Schedule EGD as well due to history of lung adenocarcinoma and oncology request to rule out GI involvement prior to surgical consult - I thoroughly discussed the procedure with the patient (at bedside) to include nature of the procedure, alternatives, benefits, and risks (including but not limited to bleeding, infection, perforation, anesthesia/cardiac pulmonary complications).  Patient verbalized understanding and gave verbal consent to proceed with procedure.    Lung cancer Adenocarcinoma of left lower lobe with PET scan May 2025 with activity left lower lobe nodule and 2 mediastinal lymph nodes biopsies confirming adenocarcinoma.  Scheduled to see surgeon January 31, 2024 with discussion of surgical resection versus chemo/radiation referred to GI for EGD/colonoscopy to rule out gastrointestinal involvement - Continue to follow with Dr. Liam Redhead - Continue with surgical referral with Dr. Luna Salinas  Heart failure with reduced ejection fraction Ejection fraction 43%  SVT  Patient is Dr. Bridgett Camps patient but put with first availability due to time sensitivity so procedure is with Dr. Yvone Herd.  Suzanna Erp, PA-C [facility] 01/23/2024, 11:32 AM  Cc: Dorrene Gaucher, NP

## 2024-01-24 DIAGNOSIS — Z6821 Body mass index (BMI) 21.0-21.9, adult: Secondary | ICD-10-CM | POA: Diagnosis not present

## 2024-01-24 DIAGNOSIS — M549 Dorsalgia, unspecified: Secondary | ICD-10-CM | POA: Diagnosis not present

## 2024-01-24 DIAGNOSIS — Z79899 Other long term (current) drug therapy: Secondary | ICD-10-CM | POA: Diagnosis not present

## 2024-01-24 DIAGNOSIS — M129 Arthropathy, unspecified: Secondary | ICD-10-CM | POA: Diagnosis not present

## 2024-01-24 DIAGNOSIS — M5136 Other intervertebral disc degeneration, lumbar region with discogenic back pain only: Secondary | ICD-10-CM | POA: Diagnosis not present

## 2024-01-24 DIAGNOSIS — G8929 Other chronic pain: Secondary | ICD-10-CM | POA: Diagnosis not present

## 2024-01-24 DIAGNOSIS — R030 Elevated blood-pressure reading, without diagnosis of hypertension: Secondary | ICD-10-CM | POA: Diagnosis not present

## 2024-01-29 DIAGNOSIS — H2513 Age-related nuclear cataract, bilateral: Secondary | ICD-10-CM | POA: Diagnosis not present

## 2024-01-29 DIAGNOSIS — H401133 Primary open-angle glaucoma, bilateral, severe stage: Secondary | ICD-10-CM | POA: Diagnosis not present

## 2024-01-31 ENCOUNTER — Other Ambulatory Visit: Payer: Self-pay | Admitting: *Deleted

## 2024-01-31 ENCOUNTER — Ambulatory Visit
Attending: Thoracic Surgery (Cardiothoracic Vascular Surgery) | Admitting: Thoracic Surgery (Cardiothoracic Vascular Surgery)

## 2024-01-31 ENCOUNTER — Encounter: Payer: Self-pay | Admitting: Thoracic Surgery (Cardiothoracic Vascular Surgery)

## 2024-01-31 ENCOUNTER — Encounter: Payer: Self-pay | Admitting: *Deleted

## 2024-01-31 VITALS — BP 130/63 | HR 67 | Resp 20 | Ht 74.0 in | Wt 164.0 lb

## 2024-01-31 DIAGNOSIS — C3432 Malignant neoplasm of lower lobe, left bronchus or lung: Secondary | ICD-10-CM

## 2024-01-31 DIAGNOSIS — R911 Solitary pulmonary nodule: Secondary | ICD-10-CM

## 2024-01-31 NOTE — H&P (View-Only) (Signed)
 PCP is [name], NP Referring Provider is Isadora Hose, MD  Chief Complaint  Patient presents with   Lung Cancer    Surgical consult./ Chest CT 11/23/23/ PET Scan 12/22/23/ Bronch 01/09/24/ PFT's 01/17/24/ MR Brain 01/23/24    HPI: Mr. Stryker is sent for consultation regarding an adenocarcinoma of the left lower lobe.  Brett [name] is a 68 year old former smoker with a past medical history significant for long-term tobacco use, COPD, paroxysmal SVT, nonsustained ventricular tachycardia, congenital hip deformity, scoliosis, multiple hip and back surgeries, cervical disc disease, chronic pain, glaucoma, rosacea, and recently diagnosed lung cancer.  He had back surgery last fall.  During the procedure he was noted to have short episodes of paroxysmal SVT treated with esmolol .  He was referred to Dr. Liborio as an outpatient.  He had a workup including Zio patch and echocardiogram.  Echo showed ejection fraction of 50 to 55% with diastolic dysfunction.  That led to a stress PET.  The PET showed severe coronary calcification but no evidence of ischemia or malperfusion.  There was a new 5 mm left lower lobe lung nodule that had not been on the patient's previous screening scans.  He had another screening scan in April which showed the nodule had increased in size to 11 mm.  The nodule measured 16 x 9 mm and was hypermetabolic with an SUV of 6.2.  There was uptake in the hilar and mediastinal regions consistent with nodes but there were no enlarged lymph nodes in those areas.  Also some uptake along the left parotid gland.  There was an area of uptake along the right fifth rib and an area of sclerosis.  Also slight bit of uptake in the eighth rib.  Also was noted to have some gastric thickening on one of the CT is scheduled to have an upper endoscopy on 02/14/2024.  He denies any chest pain, pressure, or tightness.  He does have some shortness of breath.  He does have occasional wheezing which  is better when he takes Claritin .  Also has a productive cough.  Complains of reflux and frequent heartburn.  Ambulates with a cane.  Has some weakness in the right leg.  No change in appetite or significant weight loss.  Quit smoking about 3 months ago with the help of Chantix .  Zubrod Score: At the time of surgery this patient's most appropriate activity status/level should be described as: []     0    Normal activity, no symptoms [x]     1    Restricted in physical strenuous activity but ambulatory, able to do out light work []     2    Ambulatory and capable of self care, unable to do work activities, up and about >50 % of waking hours                              []     3    Only limited self care, in bed greater than 50% of waking hours []     4    Completely disabled, no self care, confined to bed or chair []     5    Moribund   Past Medical History:  Diagnosis Date   Allergy    Arthritis    Bilateral low back pain without sciatica 09/22/2015   Blood transfusion without reported diagnosis    Cervical neck pain with evidence of disc disease 09/22/2015   Chicken pox  Congenital hip deformity    Corn of foot 08/09/2022   Degenerative disc disease, cervical 01/29/2015   Eczema 05/30/2013   Erectile dysfunction 09/22/2015   Glaucoma 05/30/2013   History of cardiac dysrhythmia 05/23/2023   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 08/06/2023   Pain of left hip joint 11/01/2018   Paroxysmal SVT (supraventricular tachycardia) (HCC), incidental finding IntraOp back surgery requiring esmolol ; event monitor November 2024 for short runs, longest episode 17 beats. 06/19/2023   Event  monitor November 2024, supraventricular ectopy burden 1.7%.  4 short runs of SVT, longest episode 17 beats.  No symptoms reported     Personal history of congenital hip dysplasia 05/30/2013   Preventative health care 01/29/2015   Rhinitis, allergic 09/22/2015   Rosacea 05/30/2013   Scoliosis deformity of spine 05/05/2020   Spinal stenosis of lumbar region 06/16/2023   Spondylolisthesis at L5-S1 level 09/14/2020   Tobacco abuse 05/30/2013    Past Surgical History:  Procedure Laterality Date   ANTERIOR LAT LUMBAR FUSION Right 05/09/2023   Procedure: Extreme Lateral Interbody Fusion Lumbar three-four -right;  Surgeon: Louis Shove, MD;  Location: [facility] OR;  Service: Neurosurgery;  Laterality: Right;  3C   BRONCHOSCOPY, WITH BIOPSY USING ELECTROMAGNETIC NAVIGATION Bilateral 01/09/2024   Procedure: ROBOTIC ASSISTED NAVIGATIONAL BRONCHOSCOPY;  Surgeon: Isadora Hose, MD;  Location: [facility] ORS;  Service: Pulmonary;  Laterality: Bilateral;   COLONOSCOPY  06/17/2015   Pyrtle   COLONOSCOPY  07/20/2020   Pyrtle   ENDOBRONCHIAL ULTRASOUND Bilateral 01/09/2024   Procedure: ENDOBRONCHIAL ULTRASOUND (EBUS);  Surgeon: Isadora Hose, MD;  Location: [facility] ORS;  Service: Pulmonary;  Laterality: Bilateral;   HAMMER TOE SURGERY  07/28/2011   Procedure: HAMMER TOE CORRECTION;  Surgeon: Toribio JULIANNA Chancy, MD;  Location: [facility];  Service: Orthopedics;  Laterality: Right;  right 2nd and 4th toes correction hammer toe, capsulotomy metatarsal-phalangeal joints   HERNIA REPAIR     HIP SURGERY  1986 & 2010   rt total hip-8/10-multiple rt hip surgeries- had 4 prior to 1986 as a child   JOINT REPLACEMENT  2010   lumber fusion  09/2020   POLYPECTOMY     SPINE SURGERY  09/14/2020   THUMB ARTHROSCOPY  2008   rt    Family History  Problem Relation Age of Onset   Cancer Mother 73       history of colon cancer   Rosacea Mother    Colon cancer Mother 28   Colon polyps Mother    Rosacea Father    Lung  disease Father        ?pulmonary fibrosis   Alcohol abuse Brother    Depression Brother    Colon cancer Maternal Grandmother    Endocrine tumor Daughter        pituitary tumor, POTTS   Thyroid  disease Son        ?hyperthyroid   Cancer Cousin        colon   Colon cancer Cousin    Esophageal cancer Neg Hx    Rectal cancer Neg Hx    Stomach cancer Neg Hx     Social History Social History   Tobacco Use   Smoking status: Former    Current packs/day: 0.00  Average packs/day: 0.5 packs/day for 50.3 years (25.2 ttl pk-yrs)    Types: Cigarettes    Start date: 23    Quit date: 12/11/2023    Years since quitting: 0.1   Smokeless tobacco: Never   Tobacco comments:    Started smoking at 68 years old.    Smoked 1 PPD at his heaviest.    Quit smoking on 12/11/2023  Vaping Use   Vaping status: Never Used  Substance Use Topics   Alcohol use: Yes    Alcohol/week: 10.0 standard drinks of alcohol    Types: 10 Standard drinks or equivalent per week   Drug use: No    Current Outpatient Medications  Medication Sig Dispense Refill   aspirin  EC 81 MG tablet Take 1 tablet (81 mg total) by mouth daily. Swallow whole.     calcium  carbonate (TUMS EX) 750 MG chewable tablet Chew 1-2 tablets by mouth daily as needed for heartburn.     empagliflozin  (JARDIANCE ) 10 MG TABS tablet Take 1 tablet (10 mg total) by mouth daily before breakfast. 90 tablet 1   metoprolol  succinate (TOPROL  XL) 25 MG 24 hr tablet Take 1 tablet (25 mg total) by mouth daily. 90 tablet 3   Multiple Vitamin (MULTIVITAMIN) tablet Take 1 tablet by mouth daily. Centrum silver     Na Sulfate-K Sulfate-Mg Sulfate concentrate (SUPREP) 17.5-3.13-1.6 GM/177ML SOLN Take 1 kit (354 mLs total) by mouth as directed. 354 mL 0   Oxycodone  HCl 10 MG TABS Take 1 tablet (10 mg total) by mouth every 6 (six) hours as needed (Pain). 30 tablet 0   sacubitril-valsartan (ENTRESTO ) 24-26 MG Take 1 tablet by mouth 2 (two) times daily. 180 tablet 2    STELARA 45 MG/0.5ML injection Inject 45 mg into the skin as directed. Every 12 weeks     timolol  (TIMOPTIC ) 0.5 % ophthalmic solution Place 1 drop into both eyes 2 (two) times daily.     TRAVATAN Z 0.004 % SOLN ophthalmic solution Place 1 drop into both eyes every morning.  1   Ustekinumab (STELARA IV) Inject into the vein. (Patient not taking: Reported on 01/04/2024)     varenicline  (CHANTIX  CONTINUING MONTH PAK) 1 MG tablet Take 1 tablet (1 mg total) by mouth 2 (two) times daily. 60 tablet 1   No current facility-administered medications for this visit.    No Known Allergies  Review of Systems  Constitutional:  Positive for fatigue. Negative for activity change.  HENT:  Negative for trouble swallowing and voice change.   Respiratory:  Positive for cough, shortness of breath and wheezing.   Cardiovascular:  Positive for leg swelling. Negative for chest pain.  Gastrointestinal:  Positive for abdominal pain (Reflux).  Genitourinary:  Negative for difficulty urinating and dysuria.  Musculoskeletal:  Positive for arthralgias, back pain, gait problem and myalgias.  Neurological:  Positive for weakness (Leg) and numbness. Negative for seizures.  Hematological:  Bruises/bleeds easily.    There were no vitals taken for this visit. Physical Exam Vitals reviewed.  Constitutional:      General: He is not in acute distress.    Appearance: Normal appearance.  HENT:     Head: Normocephalic and atraumatic.   Eyes:     General: No scleral icterus.    Extraocular Movements: Extraocular movements intact.    Cardiovascular:     Rate and Rhythm: Normal rate and regular rhythm.     Heart sounds: Normal heart sounds. No murmur heard.    No friction rub.  No gallop.  Pulmonary:     Effort: Pulmonary effort is normal. No respiratory distress.     Breath sounds: Normal breath sounds. No wheezing or rales.  Abdominal:     General: There is no distension.     Palpations: Abdomen is soft.    Musculoskeletal:     Cervical back: Neck supple.  Lymphadenopathy:     Cervical: No cervical adenopathy.   Skin:    General: Skin is warm and dry.   Neurological:     General: No focal deficit present.     Mental Status: He is alert and oriented to person, place, and time.     Cranial Nerves: No cranial nerve deficit.     Motor: Weakness (Mild right leg) present.    Diagnostic Tests: NUCLEAR MEDICINE PET SKULL BASE TO THIGH   TECHNIQUE: 7.57 mCi F-18 FDG was injected intravenously. Full-ring PET imaging was performed from the skull base to thigh after the radiotracer. CT data was obtained and used for attenuation correction and anatomic localization.   Fasting blood glucose: 95 mg/dl   COMPARISON:  Lung cancer screening CT 11/23/2023 and older   FINDINGS: Mediastinal blood pool activity: SUV max 2.1   Liver activity: SUV max 2.4   NECK: Focus of abnormal uptake with maximum SUV value of 6.9 corresponding to a focus deep to the left parotid gland. Parotid primary lesion versus a abnormal node is possible. Focus on image 26 of series 4 measures 14 mm. No additional areas of abnormal uptake in the neck including along other lymph node chains such as submandibular, posterior triangle or internal jugular regions. Near symmetric uptake of tracer along the visualized intracranial compartment.   Incidental CT findings: Mild mucosal thickening along the maxillary sinuses, right greater than left. Also some mucosal thickening along the anterior ethmoid air cells and sphenoid sinuses. The mastoid air cells are clear. Streak artifact related to the patient's dental hardware. The right parotid gland, the submandibular glands and thyroid  gland are grossly unremarkable. Only mild heterogeneity of the thyroid  gland without abnormal uptake.   CHEST: No specific abnormal uptake seen above blood pool in the axillary region or hila. Slight asymmetric uptake identified along lymph  node in the AP window with maximum SUV of 3.8. This node is small on image 88 with short axis diameter 7 mm. However this node has been slowly increasing in size from exams going back to April 2024.   Additional area of uptake maximum SUV of 3.5 along a small node medially anterior to the top of the aortic arch as seen on image 76. This node is only 3 mm. Recommend follow up surveillance.   Left lower lobe juxtapleural nodule identified once again. On prior CT this measured 12 mm and today on image 107 this measures 16 x 9 mm. Uptake has maximum SUV of 6.2.   No additional areas of abnormal uptake along the lung parenchyma.   Incidental CT findings: Emphysematous lung changes identified diffusely. No pleural effusion or pneumothorax. Minimal ground-glass laterally along the left lower lobe. Prominent coronary artery calcifications are seen. Heart is not enlarged. No pericardial effusion. The thoracic aorta is normal course and caliber with scattered partially calcified plaque. Right-sided apical pleural calcifications and thickening are identified.   ABDOMEN/PELVIS: There is physiologic distribution radiotracer along the parenchymal organs, bowel and renal collecting systems.   Incidental CT findings: Extensive vascular calcifications identified along the aorta and iliac vessels. Some branch vessel calcification as well. Grossly the liver,  spleen, adrenal glands and pancreas are unremarkable. Gallbladder is nondilated. No abnormal calcifications seen within either kidney nor along the course of either ureter. Contracted urinary bladder. Moderate diffuse colonic stool. Left-sided colonic diverticula. The stomach and small bowel are nondilated. No obvious free air or free fluid.   SKELETON: There is a focus of abnormal uptake along the anterior right ribcage measuring 4.4 maximum SUV. There is some subtle sclerosis in this location along the anterior aspect of the right fifth rib  on image 106. There is also some minimal uptake along the eighth rib margin. Nonspecific. Maximum SUV of 3.9.   There is some additional uptake noted in the lumbar spine at the disc space at L3-4 with there is a discs metallic spacer. There is extensive hardware along the lumbar spine from L3 through S1. Curvature of spine with prominent degenerative changes. Osteopenia right hip arthroplasty with streak artifact. Degenerative changes of the left hip.   IMPRESSION: Significant abnormal uptake along the left lower lobe nodule described on the prior CT scan worrisome for malignant lesion.   There are 2 mediastinal nodes which have some low-level uptake and are small but in particular the small node in the AP window has been increasing in size slowly going back to study of April 2024. Recommend attention on short follow-up.   Isolated hypermetabolic focus along the deep margin of the left parotid gland. This could be a parotid lesion versus a node. Recommend dedicated imaging of the neck region such as CT or MRI.   Small focus of uptake along the anterior aspect the right fifth rib with a small area of sclerosis. This would have a broad differential. There is also slight uptake along the anterior aspect of the eighth rib. Aggressive process is possible.   Asymmetric uptake involving the metallic disc spacer at the L3-4 level of the spine. There is extensive degenerative changes along the lower lumbar spine and hardware fixation. Please correlate with any symptoms.     Electronically Signed   By: Ranell Bring M.D.   On: 12/22/2023 15:51 I personally reviewed the CT and PET/CT images.  16 x 9 mm nodule in posterior left lower lobe that is markedly hypermetabolic.  Metabolic activity in hilar and AP window regions associated with normal sized lymph nodes.  Groundglass opacity anterior lateral lower lobe with no activity.  Emphysema, aortic atherosclerosis, coronary atherosclerosis.   Other findings as noted by radiology.  Pulmonary function testing FVC 4.99 (96%) FEV1 2.03 (53%) FEV1 2.28 (59%) post bronchodilator TLC 138% RV 190% DLCO 11.90 (40%)  Impression: Brett [name] is a 68 year old former smoker with a past medical history significant for long-term tobacco use, COPD, paroxysmal SVT, nonsustained ventricular tachycardia, congenital hip deformity, scoliosis, multiple hip and back surgeries, cervical disc disease, chronic pain, glaucoma, rosacea, and recently diagnosed lung cancer.  Adenocarcinoma left lower lobe-clinical stage Ia (T1, N0) versus 2B, T1, N1).  Given the normal size of the lymph nodes I think we should get the benefit of the doubt on that.  Surgery would be part of the treatment regimen in either case.  If the lymph nodes are positive would also need adjuvant chemotherapy.  Discussed treatment options including surgical resection versus radiation.  He understands the relative advantages and disadvantages of each approach.  I described the proposed operative procedure to Brett [name] and his daughter.  They understand the general nature of the procedure including the need for general anesthesia, the incisions to be used, the use of  surgical robot, use of drains to postoperatively, the expected hospital stay, and the overall recovery.  I informed them of the indications, risks, benefits, and alternatives.  They understand the risks include, but not limited to death, MI, DVT, PE, bleeding, possibly for transfusion, infection, prolonged air leak, cardiac arrhythmias, pain issues, as well as possibility of other unforeseeable complications.  He accepts the risk and agrees to proceed.  Has known coronary calcification.  Has had SVT under anesthesia previously.  Has been seen by Dr. Liborio.  Cardiac PET showed no evidence of ischemia, so I suspect he will be okay to proceed with surgery but will check with Dr. Liborio to be sure no additional testing is  needed.  Gastric wall thickening-noted on CT.  Scheduled to have EGD 02/14/2024.  Possible parotid lesion-will need evaluation by ENT  Plan: Proceed with EGD on 02/14/2024 Follow-up with Dr. Sherrod as scheduled Will check with Dr. Liborio to see if any additional cardiac workup is necessary Tentatively plan for robotic assisted left lower lobectomy on Monday, 02/19/2024  Elspeth JAYSON Millers, MD [facility] [phone]   Addendum ----- Message ----- From: Liborio [name], MD Sent: 01/31/2024   6:25 PM EDT To: Elspeth JAYSON Millers, MD  Erskin Marcey,  Thank you for reaching out.  He has nonischemic cardiomyopathy and no significant ischemia on the cardiac PET stress.  Evidence of coronary atherosclerosis, but that should not change his perioperative risk as he had no ischemia and clinically did not show any signs of decompensated heart failure and did not have any anginal symptoms at his recent office visit with me in May.  Nothing further cardiac workup required at this time prior to this lung surgery.  Thank you, Sreedhar. ----- Message ----- From: Millers Elspeth JAYSON, MD Sent: 01/31/2024   1:39 PM EDT To: [name] [name], MD  Alean   I saw your patient Brett [name] in the office today regarding his left lower lobe lung cancer.  I am getting him ready for a robotic assisted left lower lobectomy in a few weeks.  His PET did not show any evidence of ischemia but he did have coronary calcification.  It wanted to make sure that you did not think any additional cardiac workup is necessary prior to proceeding with a thoracic operation.  Thanks  Marcey

## 2024-01-31 NOTE — Progress Notes (Addendum)
 PCP is [name], NP Referring Provider is Isadora Hose, MD  Chief Complaint  Patient presents with   Lung Cancer    Surgical consult./ Chest CT 11/23/23/ PET Scan 12/22/23/ Bronch 01/09/24/ PFT's 01/17/24/ MR Brain 01/23/24    HPI: Mr. Stryker is sent for consultation regarding an adenocarcinoma of the left lower lobe.  [name] is a 68 year old former smoker with a past medical history significant for long-term tobacco use, COPD, paroxysmal SVT, nonsustained ventricular tachycardia, congenital hip deformity, scoliosis, multiple hip and back surgeries, cervical disc disease, chronic pain, glaucoma, rosacea, and recently diagnosed lung cancer.  He had back surgery last fall.  During the procedure he was noted to have short episodes of paroxysmal SVT treated with esmolol .  He was referred to Dr. Liborio as an outpatient.  He had a workup including Zio patch and echocardiogram.  Echo showed ejection fraction of 50 to 55% with diastolic dysfunction.  That led to a stress PET.  The PET showed severe coronary calcification but no evidence of ischemia or malperfusion.  There was a new 5 mm left lower lobe lung nodule that had not been on the patient's previous screening scans.  He had another screening scan in April which showed the nodule had increased in size to 11 mm.  The nodule measured 16 x 9 mm and was hypermetabolic with an SUV of 6.2.  There was uptake in the hilar and mediastinal regions consistent with nodes but there were no enlarged lymph nodes in those areas.  Also some uptake along the left parotid gland.  There was an area of uptake along the right fifth rib and an area of sclerosis.  Also slight bit of uptake in the eighth rib.  Also was noted to have some gastric thickening on one of the CT is scheduled to have an upper endoscopy on 02/14/2024.  He denies any chest pain, pressure, or tightness.  He does have some shortness of breath.  He does have occasional wheezing which  is better when he takes Claritin .  Also has a productive cough.  Complains of reflux and frequent heartburn.  Ambulates with a cane.  Has some weakness in the right leg.  No change in appetite or significant weight loss.  Quit smoking about 3 months ago with the help of Chantix .  Zubrod Score: At the time of surgery this patient's most appropriate activity status/level should be described as: []     0    Normal activity, no symptoms [x]     1    Restricted in physical strenuous activity but ambulatory, able to do out light work []     2    Ambulatory and capable of self care, unable to do work activities, up and about >50 % of waking hours                              []     3    Only limited self care, in bed greater than 50% of waking hours []     4    Completely disabled, no self care, confined to bed or chair []     5    Moribund   Past Medical History:  Diagnosis Date   Allergy    Arthritis    Bilateral low back pain without sciatica 09/22/2015   Blood transfusion without reported diagnosis    Cervical neck pain with evidence of disc disease 09/22/2015   Chicken pox  Congenital hip deformity    Corn of foot 08/09/2022   Degenerative disc disease, cervical 01/29/2015   Eczema 05/30/2013   Erectile dysfunction 09/22/2015   Glaucoma 05/30/2013   History of cardiac dysrhythmia 05/23/2023   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 08/06/2023   Pain of left hip joint 11/01/2018   Paroxysmal SVT (supraventricular tachycardia) (HCC), incidental finding IntraOp back surgery requiring esmolol ; event monitor November 2024 for short runs, longest episode 17 beats. 06/19/2023   Event  monitor November 2024, supraventricular ectopy burden 1.7%.  4 short runs of SVT, longest episode 17 beats.  No symptoms reported     Personal history of congenital hip dysplasia 05/30/2013   Preventative health care 01/29/2015   Rhinitis, allergic 09/22/2015   Rosacea 05/30/2013   Scoliosis deformity of spine 05/05/2020   Spinal stenosis of lumbar region 06/16/2023   Spondylolisthesis at L5-S1 level 09/14/2020   Tobacco abuse 05/30/2013    Past Surgical History:  Procedure Laterality Date   ANTERIOR LAT LUMBAR FUSION Right 05/09/2023   Procedure: Extreme Lateral Interbody Fusion Lumbar three-four -right;  Surgeon: Louis Shove, MD;  Location: [facility] OR;  Service: Neurosurgery;  Laterality: Right;  3C   BRONCHOSCOPY, WITH BIOPSY USING ELECTROMAGNETIC NAVIGATION Bilateral 01/09/2024   Procedure: ROBOTIC ASSISTED NAVIGATIONAL BRONCHOSCOPY;  Surgeon: Isadora Hose, MD;  Location: [facility] ORS;  Service: Pulmonary;  Laterality: Bilateral;   COLONOSCOPY  06/17/2015   Pyrtle   COLONOSCOPY  07/20/2020   Pyrtle   ENDOBRONCHIAL ULTRASOUND Bilateral 01/09/2024   Procedure: ENDOBRONCHIAL ULTRASOUND (EBUS);  Surgeon: Isadora Hose, MD;  Location: [facility] ORS;  Service: Pulmonary;  Laterality: Bilateral;   HAMMER TOE SURGERY  07/28/2011   Procedure: HAMMER TOE CORRECTION;  Surgeon: Toribio JULIANNA Chancy, MD;  Location: [facility];  Service: Orthopedics;  Laterality: Right;  right 2nd and 4th toes correction hammer toe, capsulotomy metatarsal-phalangeal joints   HERNIA REPAIR     HIP SURGERY  1986 & 2010   rt total hip-8/10-multiple rt hip surgeries- had 4 prior to 1986 as a child   JOINT REPLACEMENT  2010   lumber fusion  09/2020   POLYPECTOMY     SPINE SURGERY  09/14/2020   THUMB ARTHROSCOPY  2008   rt    Family History  Problem Relation Age of Onset   Cancer Mother 73       history of colon cancer   Rosacea Mother    Colon cancer Mother 28   Colon polyps Mother    Rosacea Father    Lung  disease Father        ?pulmonary fibrosis   Alcohol abuse Brother    Depression Brother    Colon cancer Maternal Grandmother    Endocrine tumor Daughter        pituitary tumor, POTTS   Thyroid  disease Son        ?hyperthyroid   Cancer Cousin        colon   Colon cancer Cousin    Esophageal cancer Neg Hx    Rectal cancer Neg Hx    Stomach cancer Neg Hx     Social History Social History   Tobacco Use   Smoking status: Former    Current packs/day: 0.00  Average packs/day: 0.5 packs/day for 50.3 years (25.2 ttl pk-yrs)    Types: Cigarettes    Start date: 23    Quit date: 12/11/2023    Years since quitting: 0.1   Smokeless tobacco: Never   Tobacco comments:    Started smoking at 68 years old.    Smoked 1 PPD at his heaviest.    Quit smoking on 12/11/2023  Vaping Use   Vaping status: Never Used  Substance Use Topics   Alcohol use: Yes    Alcohol/week: 10.0 standard drinks of alcohol    Types: 10 Standard drinks or equivalent per week   Drug use: No    Current Outpatient Medications  Medication Sig Dispense Refill   aspirin  EC 81 MG tablet Take 1 tablet (81 mg total) by mouth daily. Swallow whole.     calcium  carbonate (TUMS EX) 750 MG chewable tablet Chew 1-2 tablets by mouth daily as needed for heartburn.     empagliflozin  (JARDIANCE ) 10 MG TABS tablet Take 1 tablet (10 mg total) by mouth daily before breakfast. 90 tablet 1   metoprolol  succinate (TOPROL  XL) 25 MG 24 hr tablet Take 1 tablet (25 mg total) by mouth daily. 90 tablet 3   Multiple Vitamin (MULTIVITAMIN) tablet Take 1 tablet by mouth daily. Centrum silver     Na Sulfate-K Sulfate-Mg Sulfate concentrate (SUPREP) 17.5-3.13-1.6 GM/177ML SOLN Take 1 kit (354 mLs total) by mouth as directed. 354 mL 0   Oxycodone  HCl 10 MG TABS Take 1 tablet (10 mg total) by mouth every 6 (six) hours as needed (Pain). 30 tablet 0   sacubitril-valsartan (ENTRESTO ) 24-26 MG Take 1 tablet by mouth 2 (two) times daily. 180 tablet 2    STELARA 45 MG/0.5ML injection Inject 45 mg into the skin as directed. Every 12 weeks     timolol  (TIMOPTIC ) 0.5 % ophthalmic solution Place 1 drop into both eyes 2 (two) times daily.     TRAVATAN Z 0.004 % SOLN ophthalmic solution Place 1 drop into both eyes every morning.  1   Ustekinumab (STELARA IV) Inject into the vein. (Patient not taking: Reported on 01/04/2024)     varenicline  (CHANTIX  CONTINUING MONTH PAK) 1 MG tablet Take 1 tablet (1 mg total) by mouth 2 (two) times daily. 60 tablet 1   No current facility-administered medications for this visit.    No Known Allergies  Review of Systems  Constitutional:  Positive for fatigue. Negative for activity change.  HENT:  Negative for trouble swallowing and voice change.   Respiratory:  Positive for cough, shortness of breath and wheezing.   Cardiovascular:  Positive for leg swelling. Negative for chest pain.  Gastrointestinal:  Positive for abdominal pain (Reflux).  Genitourinary:  Negative for difficulty urinating and dysuria.  Musculoskeletal:  Positive for arthralgias, back pain, gait problem and myalgias.  Neurological:  Positive for weakness (Leg) and numbness. Negative for seizures.  Hematological:  Bruises/bleeds easily.    There were no vitals taken for this visit. Physical Exam Vitals reviewed.  Constitutional:      General: He is not in acute distress.    Appearance: Normal appearance.  HENT:     Head: Normocephalic and atraumatic.   Eyes:     General: No scleral icterus.    Extraocular Movements: Extraocular movements intact.    Cardiovascular:     Rate and Rhythm: Normal rate and regular rhythm.     Heart sounds: Normal heart sounds. No murmur heard.    No friction rub.  No gallop.  Pulmonary:     Effort: Pulmonary effort is normal. No respiratory distress.     Breath sounds: Normal breath sounds. No wheezing or rales.  Abdominal:     General: There is no distension.     Palpations: Abdomen is soft.    Musculoskeletal:     Cervical back: Neck supple.  Lymphadenopathy:     Cervical: No cervical adenopathy.   Skin:    General: Skin is warm and dry.   Neurological:     General: No focal deficit present.     Mental Status: He is alert and oriented to person, place, and time.     Cranial Nerves: No cranial nerve deficit.     Motor: Weakness (Mild right leg) present.    Diagnostic Tests: NUCLEAR MEDICINE PET SKULL BASE TO THIGH   TECHNIQUE: 7.57 mCi F-18 FDG was injected intravenously. Full-ring PET imaging was performed from the skull base to thigh after the radiotracer. CT data was obtained and used for attenuation correction and anatomic localization.   Fasting blood glucose: 95 mg/dl   COMPARISON:  Lung cancer screening CT 11/23/2023 and older   FINDINGS: Mediastinal blood pool activity: SUV max 2.1   Liver activity: SUV max 2.4   NECK: Focus of abnormal uptake with maximum SUV value of 6.9 corresponding to a focus deep to the left parotid gland. Parotid primary lesion versus a abnormal node is possible. Focus on image 26 of series 4 measures 14 mm. No additional areas of abnormal uptake in the neck including along other lymph node chains such as submandibular, posterior triangle or internal jugular regions. Near symmetric uptake of tracer along the visualized intracranial compartment.   Incidental CT findings: Mild mucosal thickening along the maxillary sinuses, right greater than left. Also some mucosal thickening along the anterior ethmoid air cells and sphenoid sinuses. The mastoid air cells are clear. Streak artifact related to the patient's dental hardware. The right parotid gland, the submandibular glands and thyroid  gland are grossly unremarkable. Only mild heterogeneity of the thyroid  gland without abnormal uptake.   CHEST: No specific abnormal uptake seen above blood pool in the axillary region or hila. Slight asymmetric uptake identified along lymph  node in the AP window with maximum SUV of 3.8. This node is small on image 88 with short axis diameter 7 mm. However this node has been slowly increasing in size from exams going back to April 2024.   Additional area of uptake maximum SUV of 3.5 along a small node medially anterior to the top of the aortic arch as seen on image 76. This node is only 3 mm. Recommend follow up surveillance.   Left lower lobe juxtapleural nodule identified once again. On prior CT this measured 12 mm and today on image 107 this measures 16 x 9 mm. Uptake has maximum SUV of 6.2.   No additional areas of abnormal uptake along the lung parenchyma.   Incidental CT findings: Emphysematous lung changes identified diffusely. No pleural effusion or pneumothorax. Minimal ground-glass laterally along the left lower lobe. Prominent coronary artery calcifications are seen. Heart is not enlarged. No pericardial effusion. The thoracic aorta is normal course and caliber with scattered partially calcified plaque. Right-sided apical pleural calcifications and thickening are identified.   ABDOMEN/PELVIS: There is physiologic distribution radiotracer along the parenchymal organs, bowel and renal collecting systems.   Incidental CT findings: Extensive vascular calcifications identified along the aorta and iliac vessels. Some branch vessel calcification as well. Grossly the liver,  spleen, adrenal glands and pancreas are unremarkable. Gallbladder is nondilated. No abnormal calcifications seen within either kidney nor along the course of either ureter. Contracted urinary bladder. Moderate diffuse colonic stool. Left-sided colonic diverticula. The stomach and small bowel are nondilated. No obvious free air or free fluid.   SKELETON: There is a focus of abnormal uptake along the anterior right ribcage measuring 4.4 maximum SUV. There is some subtle sclerosis in this location along the anterior aspect of the right fifth rib  on image 106. There is also some minimal uptake along the eighth rib margin. Nonspecific. Maximum SUV of 3.9.   There is some additional uptake noted in the lumbar spine at the disc space at L3-4 with there is a discs metallic spacer. There is extensive hardware along the lumbar spine from L3 through S1. Curvature of spine with prominent degenerative changes. Osteopenia right hip arthroplasty with streak artifact. Degenerative changes of the left hip.   IMPRESSION: Significant abnormal uptake along the left lower lobe nodule described on the prior CT scan worrisome for malignant lesion.   There are 2 mediastinal nodes which have some low-level uptake and are small but in particular the small node in the AP window has been increasing in size slowly going back to study of April 2024. Recommend attention on short follow-up.   Isolated hypermetabolic focus along the deep margin of the left parotid gland. This could be a parotid lesion versus a node. Recommend dedicated imaging of the neck region such as CT or MRI.   Small focus of uptake along the anterior aspect the right fifth rib with a small area of sclerosis. This would have a broad differential. There is also slight uptake along the anterior aspect of the eighth rib. Aggressive process is possible.   Asymmetric uptake involving the metallic disc spacer at the L3-4 level of the spine. There is extensive degenerative changes along the lower lumbar spine and hardware fixation. Please correlate with any symptoms.     Electronically Signed   By: Ranell Bring M.D.   On: 12/22/2023 15:51 I personally reviewed the CT and PET/CT images.  16 x 9 mm nodule in posterior left lower lobe that is markedly hypermetabolic.  Metabolic activity in hilar and AP window regions associated with normal sized lymph nodes.  Groundglass opacity anterior lateral lower lobe with no activity.  Emphysema, aortic atherosclerosis, coronary atherosclerosis.   Other findings as noted by radiology.  Pulmonary function testing FVC 4.99 (96%) FEV1 2.03 (53%) FEV1 2.28 (59%) post bronchodilator TLC 138% RV 190% DLCO 11.90 (40%)  Impression: Duilio Heritage is a 68 year old former smoker with a past medical history significant for long-term tobacco use, COPD, paroxysmal SVT, nonsustained ventricular tachycardia, congenital hip deformity, scoliosis, multiple hip and back surgeries, cervical disc disease, chronic pain, glaucoma, rosacea, and recently diagnosed lung cancer.  Adenocarcinoma left lower lobe-clinical stage Ia (T1, N0) versus 2B, T1, N1).  Given the normal size of the lymph nodes I think we should get the benefit of the doubt on that.  Surgery would be part of the treatment regimen in either case.  If the lymph nodes are positive would also need adjuvant chemotherapy.  Discussed treatment options including surgical resection versus radiation.  He understands the relative advantages and disadvantages of each approach.  I described the proposed operative procedure to Mr. Muzquiz and his daughter.  They understand the general nature of the procedure including the need for general anesthesia, the incisions to be used, the use of  surgical robot, use of drains to postoperatively, the expected hospital stay, and the overall recovery.  I informed them of the indications, risks, benefits, and alternatives.  They understand the risks include, but not limited to death, MI, DVT, PE, bleeding, possibly for transfusion, infection, prolonged air leak, cardiac arrhythmias, pain issues, as well as possibility of other unforeseeable complications.  He accepts the risk and agrees to proceed.  Has known coronary calcification.  Has had SVT under anesthesia previously.  Has been seen by Dr. Liborio.  Cardiac PET showed no evidence of ischemia, so I suspect he will be okay to proceed with surgery but will check with Dr. Liborio to be sure no additional testing is  needed.  Gastric wall thickening-noted on CT.  Scheduled to have EGD 02/14/2024.  Possible parotid lesion-will need evaluation by ENT  Plan: Proceed with EGD on 02/14/2024 Follow-up with Dr. Sherrod as scheduled Will check with Dr. Liborio to see if any additional cardiac workup is necessary Tentatively plan for robotic assisted left lower lobectomy on Monday, 02/19/2024  Elspeth JAYSON Millers, MD [facility] [phone]   Addendum ----- Message ----- From: Liborio [name], MD Sent: 01/31/2024   6:25 PM EDT To: Elspeth JAYSON Millers, MD  Erskin Marcey,  Thank you for reaching out.  He has nonischemic cardiomyopathy and no significant ischemia on the cardiac PET stress.  Evidence of coronary atherosclerosis, but that should not change his perioperative risk as he had no ischemia and clinically did not show any signs of decompensated heart failure and did not have any anginal symptoms at his recent office visit with me in May.  Nothing further cardiac workup required at this time prior to this lung surgery.  Thank you, Sreedhar. ----- Message ----- From: Millers Elspeth JAYSON, MD Sent: 01/31/2024   1:39 PM EDT To: [name] [name], MD  Alean   I saw your patient Fawzi Melman in the office today regarding his left lower lobe lung cancer.  I am getting him ready for a robotic assisted left lower lobectomy in a few weeks.  His PET did not show any evidence of ischemia but he did have coronary calcification.  It wanted to make sure that you did not think any additional cardiac workup is necessary prior to proceeding with a thoracic operation.  Thanks  Marcey

## 2024-02-01 ENCOUNTER — Inpatient Hospital Stay: Admitting: Internal Medicine

## 2024-02-01 ENCOUNTER — Other Ambulatory Visit: Payer: Self-pay

## 2024-02-01 ENCOUNTER — Other Ambulatory Visit (HOSPITAL_COMMUNITY): Payer: Self-pay

## 2024-02-01 ENCOUNTER — Encounter: Payer: Self-pay | Admitting: Pharmacist

## 2024-02-01 ENCOUNTER — Inpatient Hospital Stay

## 2024-02-01 VITALS — BP 136/66 | HR 62 | Temp 98.2°F | Resp 18 | Ht 74.0 in | Wt 166.8 lb

## 2024-02-01 DIAGNOSIS — C3432 Malignant neoplasm of lower lobe, left bronchus or lung: Secondary | ICD-10-CM

## 2024-02-01 DIAGNOSIS — C3490 Malignant neoplasm of unspecified part of unspecified bronchus or lung: Secondary | ICD-10-CM

## 2024-02-01 LAB — CBC WITH DIFFERENTIAL (CANCER CENTER ONLY)
Abs Immature Granulocytes: 0.02 10*3/uL (ref 0.00–0.07)
Basophils Absolute: 0.1 10*3/uL (ref 0.0–0.1)
Basophils Relative: 1 %
Eosinophils Absolute: 0.4 10*3/uL (ref 0.0–0.5)
Eosinophils Relative: 5 %
HCT: 43.7 % (ref 39.0–52.0)
Hemoglobin: 15 g/dL (ref 13.0–17.0)
Immature Granulocytes: 0 %
Lymphocytes Relative: 21 %
Lymphs Abs: 1.6 10*3/uL (ref 0.7–4.0)
MCH: 33.2 pg (ref 26.0–34.0)
MCHC: 34.3 g/dL (ref 30.0–36.0)
MCV: 96.7 fL (ref 80.0–100.0)
Monocytes Absolute: 1 10*3/uL (ref 0.1–1.0)
Monocytes Relative: 13 %
Neutro Abs: 4.7 10*3/uL (ref 1.7–7.7)
Neutrophils Relative %: 60 %
Platelet Count: 199 10*3/uL (ref 150–400)
RBC: 4.52 MIL/uL (ref 4.22–5.81)
RDW: 12.1 % (ref 11.5–15.5)
WBC Count: 7.8 10*3/uL (ref 4.0–10.5)
nRBC: 0 % (ref 0.0–0.2)

## 2024-02-01 LAB — CMP (CANCER CENTER ONLY)
ALT: 11 U/L (ref 0–44)
AST: 18 U/L (ref 15–41)
Albumin: 4.3 g/dL (ref 3.5–5.0)
Alkaline Phosphatase: 47 U/L (ref 38–126)
Anion gap: 5 (ref 5–15)
BUN: 17 mg/dL (ref 8–23)
CO2: 28 mmol/L (ref 22–32)
Calcium: 9.4 mg/dL (ref 8.9–10.3)
Chloride: 104 mmol/L (ref 98–111)
Creatinine: 0.95 mg/dL (ref 0.61–1.24)
GFR, Estimated: >60 mL/min (ref >60)
Glucose, Bld: 117 mg/dL — ABNORMAL HIGH (ref 70–99)
Potassium: 5.2 mmol/L — ABNORMAL HIGH (ref 3.5–5.1)
Sodium: 137 mmol/L (ref 135–145)
Total Bilirubin: 0.5 mg/dL (ref 0.0–1.2)
Total Protein: 7.2 g/dL (ref 6.5–8.1)

## 2024-02-01 NOTE — Progress Notes (Signed)
 [facility] [phone]   [phone]  OFFICE PROGRESS NOTE  [name], NP [address] [place] [address]  DIAGNOSIS: Stage IA/IIB (T1b, N0/N2a, M0) non-small cell lung cancer, adenocarcinoma presented with left lower lobe lung nodule was suspicious mediastinal lymph nodes diagnosed in June 2025.  PRIOR THERAPY: None  CURRENT THERAPY: Consideration of surgical resection and currently under evaluation by Dr. Kerrin.  Surgery is expected on February 19, 2024.  INTERVAL HISTORY: Brett [name] 68 y.o. male returns to the clinic today for follow-up visit accompanied by his daughter.Discussed the use of AI scribe software for clinical note transcription with the patient, who gave verbal consent to proceed.  History of Present Illness   Brett [name] is a 68 year old male with suspicious stage 1A/2B non-small cell lung cancer who presents for follow-up regarding his upcoming surgery. He is accompanied by his daughter.  He has a history of suspicious stage 1A/2B non-small cell lung cancer, adenocarcinoma, diagnosed in June 2025, presenting with a left lower lobe lung nodule and suspicious mediastinal lymphadenopathy. Surgery is scheduled for February 19, 2024, with a thoracic surgeon, which will involve the dissection and testing of lymph nodes.  He experiences back pain but has no chest pain or breathing issues. An MRI of the brain was recently performed and was negative for any concerning findings. He is scheduled for a biopsy of his neck next Monday and an endoscopy and colonoscopy on February 14, 2024.  No chest pain or breathing issues.       MEDICAL HISTORY: Past Medical History:  Diagnosis Date   Allergy    Arthritis    Bilateral low back pain without sciatica 09/22/2015   Blood transfusion without reported diagnosis    Cervical neck pain with evidence of disc disease 09/22/2015   Chicken pox    Congenital hip  deformity    Corn of foot 08/09/2022   Degenerative disc disease, cervical 01/29/2015   Eczema 05/30/2013   Erectile dysfunction 09/22/2015   Glaucoma 05/30/2013   History of cardiac dysrhythmia 05/23/2023   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 08/06/2023   Pain of left hip joint 11/01/2018   Paroxysmal SVT (supraventricular tachycardia) (HCC), incidental finding IntraOp back surgery requiring esmolol ; event monitor November 2024 for short runs, longest episode 17 beats. 06/19/2023   Event monitor November 2024, supraventricular ectopy burden 1.7%.  4 short runs of SVT, longest episode 17 beats.  No symptoms reported     Personal history of congenital hip dysplasia 05/30/2013   Preventative health care 01/29/2015   Rhinitis, allergic 09/22/2015   Rosacea 05/30/2013   Scoliosis deformity of spine 05/05/2020   Spinal stenosis of lumbar region 06/16/2023   Spondylolisthesis at L5-S1 level 09/14/2020   Tobacco abuse 05/30/2013    ALLERGIES:  has no known allergies.  MEDICATIONS:  Current Outpatient Medications  Medication Sig Dispense Refill   aspirin  EC 81 MG tablet Take 1 tablet (81 mg total) by mouth daily. Swallow whole.     calcium  carbonate (TUMS EX) 750 MG chewable tablet Chew 1-2 tablets by mouth daily as needed for heartburn.     empagliflozin  (JARDIANCE ) 10 MG  TABS tablet Take 1 tablet (10 mg total) by mouth daily before breakfast. 90 tablet 1   metoprolol  succinate (TOPROL  XL) 25 MG 24 hr tablet Take 1 tablet (25 mg total) by mouth daily. 90 tablet 3   Multiple Vitamin (MULTIVITAMIN) tablet Take 1 tablet by mouth daily. Centrum silver     Na Sulfate-K Sulfate-Mg  Sulfate concentrate (SUPREP) 17.5-3.13-1.6 GM/177ML SOLN Take 1 kit (354 mLs total) by mouth as directed. 354 mL 0   Oxycodone  HCl 10 MG TABS Take 1 tablet (10 mg total) by mouth every 6 (six) hours as needed (Pain). 30 tablet 0   sacubitril-valsartan (ENTRESTO ) 24-26 MG Take 1 tablet by mouth 2 (two) times daily. 180 tablet 2   STELARA 45 MG/0.5ML injection Inject 45 mg into the skin as directed. Every 12 weeks     timolol  (TIMOPTIC ) 0.5 % ophthalmic solution Place 1 drop into both eyes 2 (two) times daily.     TRAVATAN Z 0.004 % SOLN ophthalmic solution Place 1 drop into both eyes every morning.  1   varenicline  (CHANTIX  CONTINUING MONTH PAK) 1 MG tablet Take 1 tablet (1 mg total) by mouth 2 (two) times daily. 60 tablet 1   No current facility-administered medications for this visit.    SURGICAL HISTORY:  Past Surgical History:  Procedure Laterality Date   ANTERIOR LAT LUMBAR FUSION Right 05/09/2023   Procedure: Extreme Lateral Interbody Fusion Lumbar three-four -right;  Surgeon: Louis Shove, MD;  Location: [facility] OR;  Service: Neurosurgery;  Laterality: Right;  3C   BRONCHOSCOPY, WITH BIOPSY USING ELECTROMAGNETIC NAVIGATION Bilateral 01/09/2024   Procedure: ROBOTIC ASSISTED NAVIGATIONAL BRONCHOSCOPY;  Surgeon: Isadora Hose, MD;  Location: [facility] ORS;  Service: Pulmonary;  Laterality: Bilateral;   COLONOSCOPY  06/17/2015   Pyrtle   COLONOSCOPY  07/20/2020   Pyrtle   ENDOBRONCHIAL ULTRASOUND Bilateral 01/09/2024   Procedure: ENDOBRONCHIAL ULTRASOUND (EBUS);  Surgeon: Isadora Hose, MD;  Location: [facility] ORS;  Service: Pulmonary;  Laterality: Bilateral;   HAMMER TOE SURGERY  07/28/2011   Procedure: HAMMER TOE CORRECTION;  Surgeon: Toribio JULIANNA Chancy, MD;  Location: [facility];  Service: Orthopedics;  Laterality: Right;  right 2nd and 4th toes correction hammer toe, capsulotomy metatarsal-phalangeal joints   HERNIA REPAIR     HIP SURGERY  1986 & 2010   rt total hip-8/10-multiple rt hip  surgeries- had 4 prior to 1986 as a child   JOINT REPLACEMENT  2010   lumber fusion  09/2020   POLYPECTOMY     SPINE SURGERY  09/14/2020   THUMB ARTHROSCOPY  2008   rt    REVIEW OF SYSTEMS:  A comprehensive review of systems was negative except for: Musculoskeletal: positive for back pain   PHYSICAL EXAMINATION: General appearance: alert, cooperative, and no distress Head: Normocephalic, without obvious abnormality, atraumatic Neck: no adenopathy, no JVD, supple, symmetrical, trachea midline, and thyroid  not enlarged, symmetric, no tenderness/mass/nodules Lymph nodes: Cervical, supraclavicular, and axillary nodes normal. Resp: clear to auscultation bilaterally Back: symmetric, no curvature. ROM normal. No CVA tenderness. Cardio: regular rate and rhythm, S1, S2 normal, no murmur, click, rub or gallop GI: soft, non-tender; bowel sounds normal; no masses,  no organomegaly Extremities: extremities normal, atraumatic, no cyanosis or edema  ECOG PERFORMANCE STATUS: 1 - Symptomatic but completely ambulatory  There were no vitals taken for this visit.  LABORATORY DATA: Lab Results  Component Value Date   WBC 7.8 02/01/2024   HGB 15.0 02/01/2024   HCT 43.7 02/01/2024   MCV 96.7  02/01/2024   PLT 199 02/01/2024      Chemistry      Component Value Date/Time   NA 138 01/16/2024 1354   NA 138 12/21/2023 0918   K 4.2 01/16/2024 1354   CL 105 01/16/2024 1354   CO2 28 01/16/2024 1354   BUN 19 01/16/2024 1354   BUN 15 12/21/2023 0918   CREATININE 0.92 01/16/2024 1354      Component Value Date/Time   CALCIUM  9.0 01/16/2024 1354   ALKPHOS 43 01/16/2024 1354   AST 17 01/16/2024 1354   ALT 11 01/16/2024 1354   BILITOT 0.3 01/16/2024 1354       RADIOGRAPHIC STUDIES: MR BRAIN W WO CONTRAST Result Date: 01/23/2024 EXAM DESCRIPTION: MR BRAIN W WO CONTRAST CLINICAL HISTORY: Non-small cell lung cancer (NSCLC), staging COMPARISON: None available TECHNIQUE: Non contrast MRI of the brain  is performed according to our usual protocol including multiplanar multi sequence technique. FINDINGS: No acute intracranial hemorrhage, mass, edema, or hydrocephalus. No areas of abnormal enhancement. Mild cortical atrophy. No encephalomalacia. No white matter disease. The vascular flow voids are unremarkable. Mild chronic appearing pansinusitis. IMPRESSION: No evidence of metastatic disease. Mild cortical atrophy. Mild chronic appearing sinus disease. Electronically signed by: Reyes Frees MD 01/23/2024 03:13 PM EDT RP Workstation: [id number]   CT SUPER D CHEST WO CONTRAST Result Date: 01/21/2024 CLINICAL DATA:  Lung nodule.  * Tracking Code: BO * EXAM: CT CHEST WITHOUT CONTRAST TECHNIQUE: Multidetector CT imaging of the chest was performed using thin slice collimation for electromagnetic bronchoscopy planning purposes, without intravenous contrast. RADIATION DOSE REDUCTION: This exam was performed according to the departmental dose-optimization program which includes automated exposure control, adjustment of the mA and/or kV according to patient size and/or use of iterative reconstruction technique. COMPARISON:  PET 12/22/2023, CT chest 11/23/2023, 11/21/2022. FINDINGS: Cardiovascular: Atherosclerotic calcification of the aorta, aortic valve and coronary arteries. Heart size normal. No pericardial effusion. Mediastinum/Nodes: No pathologically enlarged mediastinal or axillary lymph nodes. Hilar regions are difficult to definitively evaluate without IV contrast. Esophagus is grossly unremarkable. Lungs/Pleura: Biapical pleuroparenchymal scarring. Centrilobular and paraseptal emphysema. Calcified granulomas. Pure ground-glass nodule in the anterolateral left lower lobe measures 1.8 x 2.9 cm, unchanged. Solid nodule in the posterior left lower lobe measures 1.0 x 1.7 cm (5/35), enlarged from 0.5 x 1.4 cm on 11/21/2023. Lesion was hypermetabolic on 12/22/2023 and is largely new from 11/21/2022. Side-by-side  nodules in the anterior left lower lobe measure up to 5 mm (5/111), unchanged from 11/21/2022. No pleural fluid. Airway is unremarkable. Upper Abdomen: Visualized portions of the liver, gallbladder, adrenal glands, kidneys, spleen, pancreas, stomach and bowel are grossly unremarkable. No upper abdominal adenopathy. Musculoskeletal: Postoperative changes in the lumbar spine. Degenerative changes in the spine. Osteopenia. IMPRESSION: 1. Enlarging posterior left lower lobe nodule, hypermetabolic on 12/22/2023 and indicative of stage IA primary bronchogenic carcinoma. 2. Pure ground-glass nodule in the anterolateral left lower lobe, stable. Recommend continued attention on follow-up as low-grade adenocarcinoma can have this appearance. 3. Aortic atherosclerosis (ICD10-I70.0). Coronary artery calcification. 4.  Emphysema (ICD10-J43.9). Electronically Signed   By: Newell Eke M.D.   On: 01/21/2024 14:43   DG Chest Port 1 View Result Date: 01/09/2024 CLINICAL DATA:  Status post bronchoscopy EXAM: PORTABLE CHEST 1 VIEW COMPARISON:  September 22, 2022 FINDINGS: The heart size and mediastinal contours are within normal limits. Both lungs are clear. The visualized skeletal structures are unremarkable. IMPRESSION: No active disease. Electronically Signed   By: Franky Chard M.D.   On: 01/09/2024  14:07   DG C-Arm 1-60 Min-No Report Result Date: 01/09/2024 Fluoroscopy was utilized by the requesting physician.  No radiographic interpretation.   MR NECK SOFT TISSUE ONLY W WO CONTRAST Result Date: 01/08/2024 CLINICAL DATA:  Parotid region mass EXAM: MRI OF THE NECK WITH CONTRAST TECHNIQUE: Multiplanar, multisequence MR imaging was performed following the administration of intravenous contrast. CONTRAST:  7mL GADAVIST  GADOBUTROL  1 MMOL/ML IV SOLN COMPARISON:  None Available. FINDINGS: Pharynx and larynx: Salivary glands: Subtle T1 hypointense 1.1 cm T1 hypointensity in the inferior left parotid gland at the site of  previously seen hypermetabolic activity on PET CT (series 3, image 9), suspicious for a mass. Otherwise, the parotid glands and submandibular glands are within normal limits. Thyroid : Unremarkable. Lymph nodes: Aside from the possible intraparotid lymph node described above, no pathologically enlarged lymph nodes in the neck. Limited intracranial: Unremarkable. Visualized orbits: Not imaged. Mastoids and visualized paranasal sinuses: Clear. Skeleton: Degenerative/discogenic endplate signal changes at C6-C7. Upper chest: Clear. IMPRESSION: Subtle T1 hypointense 1.1 cm T1 hypointensity in the inferior left parotid gland at the site of previously seen hypermetabolic activity on PET CT, suspicious for a mass. This could represent a primary parotid neoplasm or mildly enlarged intraparotid lymph node. Recommend ENT consultation for management. Electronically Signed   By: Gilmore GORMAN Molt M.D.   On: 01/08/2024 20:11    ASSESSMENT AND PLAN: This is a very pleasant 68 years old white male with Stage IA/IIB (T1b, N0/N2a, M0) non-small cell lung cancer, adenocarcinoma presented with left lower lobe lung nodule was suspicious mediastinal lymph nodes diagnosed in June 2025. Assessment and Plan    Non-small cell lung cancer, stage 1A/2B Suspicious stage 1A/2B non-small cell lung cancer, adenocarcinoma, with left lower lobe lung nodule and suspicious mediastinal lymphadenopathy. - Proceed with scheduled surgery on February 19, 2024. - Perform lymph node dissection and testing during surgery. - Conduct endoscopy and colonoscopy on February 14, 2024. - Review pathology report post-surgery to determine need for additional treatment or monitoring. - Discussed potential for robotic-assisted surgery and expected hospital stay of three to four days. - Emphasized importance of pre-surgical evaluations. - Shared decision-making involved understanding potential outcomes based on lymph node pathology.  Back pain Reported back pain  without specific details on severity or impact on daily activities. - No immediate intervention planned as focus is on lung cancer treatment.   The patient was advised to call immediately if she has any concerning symptoms in the interval. The patient voices understanding of current disease status and treatment options and is in agreement with the current care plan.  All questions were answered. The patient knows to call the clinic with any problems, questions or concerns. We can certainly see the patient much sooner if necessary.  The total time spent in the appointment was 20 minutes including review of chart and various tests results, discussions about plan of care and coordination of care plan .   Disclaimer: This note was dictated with voice recognition software. Similar sounding words can inadvertently be transcribed and may not be corrected upon review.

## 2024-02-02 ENCOUNTER — Other Ambulatory Visit: Payer: Self-pay

## 2024-02-05 ENCOUNTER — Encounter: Payer: Self-pay | Admitting: Pediatrics

## 2024-02-05 ENCOUNTER — Ambulatory Visit (HOSPITAL_COMMUNITY)
Admission: RE | Admit: 2024-02-05 | Discharge: 2024-02-05 | Disposition: A | Discharge status: Home / Self Care | Source: Ambulatory Visit | Attending: Student in an Organized Health Care Education/Training Program

## 2024-02-05 DIAGNOSIS — K118 Other diseases of salivary glands: Secondary | ICD-10-CM | POA: Insufficient documentation

## 2024-02-05 MED ORDER — LIDOCAINE HCL (PF) 1 % IJ SOLN
3.0000 mL | Freq: Once | INTRAMUSCULAR | Status: AC
  Administered 2024-02-05: 3 mL via INTRADERMAL

## 2024-02-05 NOTE — Progress Notes (Signed)
 Addendum: Reviewed and agree with assessment and management plan. Asha Grumbine, Carie Caddy, MD

## 2024-02-05 NOTE — Procedures (Signed)
 Interventional Radiology Procedure Note  Procedure: US  Guided FNA of left parotid mass  Complications: None  Estimated Blood Loss: < 10 mL  Findings: 25 G FNA biopsy of 2.2 cm left parotid mass performed under US  guidance.  Five FNA samples obtained for cytology.  Marcey DASEN. Luverne, M.D Pager:  [phone]

## 2024-02-06 ENCOUNTER — Ambulatory Visit: Admitting: Physician Assistant

## 2024-02-06 LAB — CYTOLOGY - NON PAP

## 2024-02-07 ENCOUNTER — Ambulatory Visit: Payer: Self-pay | Admitting: Student in an Organized Health Care Education/Training Program

## 2024-02-07 DIAGNOSIS — K118 Other diseases of salivary glands: Secondary | ICD-10-CM

## 2024-02-07 DIAGNOSIS — D119 Benign neoplasm of major salivary gland, unspecified: Secondary | ICD-10-CM

## 2024-02-08 LAB — FUNGAL ORGANISM REFLEX

## 2024-02-08 LAB — FUNGUS CULTURE RESULT

## 2024-02-08 LAB — FUNGUS CULTURE WITH STAIN

## 2024-02-12 ENCOUNTER — Encounter: Payer: Self-pay | Admitting: Family

## 2024-02-12 NOTE — Progress Notes (Unsigned)
 [facility] History and Physical   Primary Care Physician:  [name], NP   Reason for Procedure:  Evaluation for GI tract involvement of adenocarcinoma, history of colon polyps-tubular adenomas and sessile serrated polyps, family history of colorectal cancer in first-degree relative - mother in second-degree relative-cousin  Plan:    Upper endoscopy and colonoscopy     HPI: Brett [name] is a 68 y.o. male undergoing upper endoscopy and colonoscopy to evaluate for GI tract involvement of adenocarcinoma as well as personal history of colon polyps and family history of colorectal cancer.  Patient was recently diagnosed with lung cancer by a needle biopsy.  He was coming due for colonoscopy for colon polyp surveillance.  Request was placed to perform both upper endoscopy and colonoscopy to ensure no evidence of GI tract malignancy before proceeding with treatment of lung cancer.  Patient does not have any current upper GI symptoms.  Notes some gas pain.  Colonoscopy in 2021 disclosed 6 polyps including 1 polyp in the ascending colon removed in piecemeal fashion.  Histopathology showed tubular adenomas and sessile serrated polyp.  Follow-up colonoscopy in 2022 did not disclose any polyps.  There is a documented family history of colorectal cancer in the patient's mother.  A cousin had colorectal cancer as well.   Past Medical History:  Diagnosis Date   Allergy    Arthritis    Bilateral low back pain without sciatica 09/22/2015   Blood transfusion without reported diagnosis    Cervical neck pain with evidence of disc disease 09/22/2015   Chicken pox    Congenital hip deformity    Corn of foot 08/09/2022   Degenerative disc disease, cervical 01/29/2015   Eczema 05/30/2013   Erectile dysfunction 09/22/2015   Glaucoma 05/30/2013   History of cardiac dysrhythmia 05/23/2023   Isthmic spondylolisthesis 05/05/2020   Lumbar adjacent segment disease with  spondylolisthesis 05/09/2023   Need for shingles vaccine 09/22/2015   Neuromuscular disorder (HCC) 11/2019   sciatica   Nodule of left lung    Nonsustained ventricular tachycardia (HCC), identified on event monitor November 2024, 1 episode 10 beats duration, asymptomatic. 08/06/2023   Pain of left hip joint 11/01/2018   Paroxysmal SVT (supraventricular tachycardia) (HCC), incidental finding IntraOp back surgery requiring esmolol ; event monitor November 2024 for short runs, longest episode 17 beats. 06/19/2023   Event monitor November 2024, supraventricular ectopy burden 1.7%.  4 short runs of SVT, longest episode 17 beats.  No symptoms reported     Personal history of congenital hip dysplasia 05/30/2013   Preventative health care 01/29/2015   Rhinitis, allergic 09/22/2015   Rosacea 05/30/2013   Scoliosis deformity of spine 05/05/2020   Spinal stenosis of lumbar region 06/16/2023   Spondylolisthesis at L5-S1 level 09/14/2020   Tobacco abuse 05/30/2013    Past Surgical History:  Procedure Laterality Date   ANTERIOR LAT LUMBAR FUSION Right 05/09/2023   Procedure: Extreme Lateral Interbody Fusion Lumbar three-four -right;  Surgeon: Louis Shove, MD;  Location: [facility] OR;  Service: Neurosurgery;  Laterality: Right;  3C   BRONCHOSCOPY, WITH BIOPSY USING ELECTROMAGNETIC NAVIGATION Bilateral 01/09/2024   Procedure: ROBOTIC ASSISTED NAVIGATIONAL BRONCHOSCOPY;  Surgeon: Isadora Hose, MD;  Location: [facility] ORS;  Service: Pulmonary;  Laterality: Bilateral;   COLONOSCOPY  06/17/2015   Pyrtle   COLONOSCOPY  07/20/2020   Pyrtle   ENDOBRONCHIAL ULTRASOUND Bilateral 01/09/2024   Procedure: ENDOBRONCHIAL ULTRASOUND (EBUS);  Surgeon: Isadora Hose, MD;  Location: [facility] ORS;  Service: Pulmonary;  Laterality: Bilateral;   HAMMER  TOE SURGERY  07/28/2011   Procedure: HAMMER TOE CORRECTION;  Surgeon: Toribio JULIANNA Chancy, MD;  Location: [facility];  Service: Orthopedics;  Laterality: Right;  right 2nd and  4th toes correction hammer toe, capsulotomy metatarsal-phalangeal joints   HERNIA REPAIR     HIP SURGERY  1986 & 2010   rt total hip-8/10-multiple rt hip surgeries- had 4 prior to 1986 as a child   JOINT REPLACEMENT  2010   lumber fusion  09/2020   POLYPECTOMY     SPINE SURGERY  09/14/2020   THUMB ARTHROSCOPY  2008   rt    Prior to Admission medications   Medication Sig Start Date End Date Taking? Authorizing Provider  aspirin  EC 81 MG tablet Take 1 tablet (81 mg total) by mouth daily. Swallow whole. 12/07/23   [name], [name], MD  calcium  carbonate (TUMS EX) 750 MG chewable tablet Chew 1-2 tablets by mouth 2 (two) times daily as needed for heartburn.    [provider]  empagliflozin  (JARDIANCE ) 10 MG TABS tablet Take 1 tablet (10 mg total) by mouth daily before breakfast. 12/07/23   [name], [name], MD  metoprolol  succinate (TOPROL  XL) 25 MG 24 hr tablet Take 1 tablet (25 mg total) by mouth daily. 12/04/23   [name], [name], MD  Multiple Vitamin (MULTIVITAMIN WITH MINERALS) TABS tablet Take 1 tablet by mouth in the morning.    [provider]  Na Sulfate-K Sulfate-Mg Sulfate concentrate (SUPREP) 17.5-3.13-1.6 GM/177ML SOLN Take 1 kit (354 mLs total) by mouth as directed. 01/23/24   McMichael, Nestor [name], PA-C  Oxycodone  HCl 10 MG TABS Take 1 tablet (10 mg total) by mouth every 6 (six) hours as needed (Pain). 05/10/23   Louis Shove, MD  sacubitril-valsartan (ENTRESTO ) 24-26 MG Take 1 tablet by mouth 2 (two) times daily. 12/07/23   [name], [name], MD  STELARA 45 MG/0.5ML injection Inject 45 mg into the skin as directed. Every 12 weeks    [provider]  Timolol  Maleate, Once-Daily, 0.5 % SOLN Place 1 drop into both eyes in the morning and at bedtime. 01/29/24   [provider]  TRAVATAN Z 0.004 % SOLN ophthalmic solution Place 1 drop into both eyes every morning. 01/20/15   [provider]  varenicline  (CHANTIX  CONTINUING MONTH PAK) 1  MG tablet Take 1 tablet (1 mg total) by mouth 2 (two) times daily. 01/02/24   O'Sullivan, Melissa, NP    Current Outpatient Medications  Medication Sig Dispense Refill   aspirin  EC 81 MG tablet Take 1 tablet (81 mg total) by mouth daily. Swallow whole.     calcium  carbonate (TUMS EX) 750 MG chewable tablet Chew 1-2 tablets by mouth 2 (two) times daily as needed for heartburn.     empagliflozin  (JARDIANCE ) 10 MG TABS tablet Take 1 tablet (10 mg total) by mouth daily before breakfast. 90 tablet 1   metoprolol  succinate (TOPROL  XL) 25 MG 24 hr tablet Take 1 tablet (25 mg total) by mouth daily. 90 tablet 3   Multiple Vitamin (MULTIVITAMIN WITH MINERALS) TABS tablet Take 1 tablet by mouth in the morning.     Na Sulfate-K Sulfate-Mg Sulfate concentrate (SUPREP) 17.5-3.13-1.6 GM/177ML SOLN Take 1 kit (354 mLs total) by mouth as directed. 354 mL 0   Oxycodone  HCl 10 MG TABS Take 1 tablet (10 mg total) by mouth every 6 (six) hours as needed (Pain). 30 tablet 0   sacubitril-valsartan (ENTRESTO ) 24-26 MG Take 1 tablet by mouth 2 (two)  times daily. 180 tablet 2   STELARA 45 MG/0.5ML injection Inject 45 mg into the skin as directed. Every 12 weeks     Timolol  Maleate, Once-Daily, 0.5 % SOLN Place 1 drop into both eyes in the morning and at bedtime.     TRAVATAN Z 0.004 % SOLN ophthalmic solution Place 1 drop into both eyes every morning.  1   varenicline  (CHANTIX  CONTINUING MONTH PAK) 1 MG tablet Take 1 tablet (1 mg total) by mouth 2 (two) times daily. 60 tablet 1   No current facility-administered medications for this visit.    Allergies as of 02/14/2024   (No Known Allergies)    Family History  Problem Relation Age of Onset   Cancer Mother 46       history of colon cancer   Rosacea Mother    Colon cancer Mother 40   Colon polyps Mother    Rosacea Father    Lung disease Father        ?pulmonary fibrosis   Alcohol abuse Brother    Depression Brother    Colon cancer Maternal Grandmother     Endocrine tumor Daughter        pituitary tumor, POTTS   Thyroid  disease Son        ?hyperthyroid   Cancer Cousin        colon   Colon cancer Cousin    Esophageal cancer Neg Hx    Rectal cancer Neg Hx    Stomach cancer Neg Hx     Social History   Socioeconomic History   Marital status: Widowed    Spouse name: Not on file   Number of children: Not on file   Years of education: Not on file   Highest education level: Associate degree: occupational, Scientist, product/process development, or vocational program  Occupational History   Not on file  Tobacco Use   Smoking status: Former    Current packs/day: 0.00    Average packs/day: 0.5 packs/day for 50.3 years (25.2 ttl pk-yrs)    Types: Cigarettes    Start date: 6    Quit date: 12/11/2023    Years since quitting: 0.1   Smokeless tobacco: Never   Tobacco comments:    Started smoking at 68 years old.    Smoked 1 PPD at his heaviest.    Quit smoking on 12/11/2023  Vaping Use   Vaping status: Never Used  Substance and Sexual Activity   Alcohol use: Yes    Alcohol/week: 10.0 standard drinks of alcohol    Types: 10 Standard drinks or equivalent per week   Drug use: No   Sexual activity: Not Currently    Birth control/protection: None  Other Topics Concern   Not on file  Social History Narrative   Quality control Tech- gauges/callibrations.  (Retired)   Some colleg/tech school   wife passed   3 grown children (oldest daughter is living with them) youngest daughter lives in [place].  Curator at [employer].  Son lives near [place]- framing/art.   Social Drivers of Corporate investment banker Strain: Low Risk  (02/06/2024)   Overall Financial Resource Strain (CARDIA)    Difficulty of Paying Living Expenses: Not hard at all  Recent Concern: Financial Resource Strain - Medium Risk (12/03/2023)   Overall Financial Resource Strain (CARDIA)    Difficulty of Paying Living Expenses: Somewhat hard  Food Insecurity: No Food Insecurity (02/06/2024)    Hunger Vital Sign    Worried About Programme researcher, broadcasting/film/video in  the Last Year: Never true    Ran Out of Food in the Last Year: Never true  Transportation Needs: No Transportation Needs (02/06/2024)   PRAPARE - Administrator, Civil Service (Medical): No    Lack of Transportation (Non-Medical): No  Physical Activity: Inactive (02/06/2024)   Exercise Vital Sign    Days of Exercise per Week: 0 days    Minutes of Exercise per Session: Not on file  Stress: No Stress Concern Present (02/06/2024)   [employer] of Occupational Health - Occupational Stress Questionnaire    Feeling of Stress: Only a little  Social Connections: Moderately Isolated (02/06/2024)   Social Connection and Isolation Panel    Frequency of Communication with Friends and Family: More than three times a week    Frequency of Social Gatherings with Friends and Family: Three times a week    Attends Religious Services: 1 to 4 times per year    Active Member of Clubs or Organizations: No    Attends Banker Meetings: Not on file    Marital Status: Widowed  Intimate Partner Violence: Not At Risk (01/16/2024)   Humiliation, Afraid, Rape, and Kick questionnaire    Fear of Current or Ex-Partner: No    Emotionally Abused: No    Physically Abused: No    Sexually Abused: No    Review of Systems:  All other review of systems negative except as mentioned in the HPI.  Physical Exam: Vital signs There were no vitals taken for this visit.  General:   Alert,  Well-developed, well-nourished, pleasant and cooperative in NAD Airway:  Mallampati  Lungs:  Clear throughout to auscultation.   Heart:  Regular rate and rhythm; no murmurs, clicks, rubs,  or gallops. Abdomen:  Soft, nontender and nondistended. Normal bowel sounds.   Neuro/Psych:  Normal mood and affect. A and O x 3  Inocente Hausen, MD [facility]

## 2024-02-13 ENCOUNTER — Ambulatory Visit

## 2024-02-14 ENCOUNTER — Encounter: Payer: Self-pay | Admitting: Pediatrics

## 2024-02-14 ENCOUNTER — Ambulatory Visit: Admitting: Pediatrics

## 2024-02-14 VITALS — BP 126/69 | HR 68 | Temp 98.3°F | Resp 17 | Ht 74.0 in | Wt 164.0 lb

## 2024-02-14 DIAGNOSIS — K2980 Duodenitis without bleeding: Secondary | ICD-10-CM | POA: Diagnosis not present

## 2024-02-14 DIAGNOSIS — K449 Diaphragmatic hernia without obstruction or gangrene: Secondary | ICD-10-CM

## 2024-02-14 DIAGNOSIS — Z1211 Encounter for screening for malignant neoplasm of colon: Secondary | ICD-10-CM

## 2024-02-14 DIAGNOSIS — K5730 Diverticulosis of large intestine without perforation or abscess without bleeding: Secondary | ICD-10-CM

## 2024-02-14 DIAGNOSIS — R948 Abnormal results of function studies of other organs and systems: Secondary | ICD-10-CM

## 2024-02-14 DIAGNOSIS — K2970 Gastritis, unspecified, without bleeding: Secondary | ICD-10-CM

## 2024-02-14 DIAGNOSIS — R933 Abnormal findings on diagnostic imaging of other parts of digestive tract: Secondary | ICD-10-CM | POA: Diagnosis not present

## 2024-02-14 DIAGNOSIS — D124 Benign neoplasm of descending colon: Secondary | ICD-10-CM

## 2024-02-14 DIAGNOSIS — K209 Esophagitis, unspecified without bleeding: Secondary | ICD-10-CM

## 2024-02-14 DIAGNOSIS — K648 Other hemorrhoids: Secondary | ICD-10-CM | POA: Diagnosis not present

## 2024-02-14 DIAGNOSIS — Z860100 Personal history of colon polyps, unspecified: Secondary | ICD-10-CM

## 2024-02-14 DIAGNOSIS — I471 Supraventricular tachycardia, unspecified: Secondary | ICD-10-CM | POA: Diagnosis not present

## 2024-02-14 MED ORDER — SODIUM CHLORIDE 0.9 % IV SOLN: 500.0000 mL | Freq: Once | INTRAVENOUS | Status: DC

## 2024-02-14 NOTE — Progress Notes (Signed)
 Vss nad trans to pacu

## 2024-02-14 NOTE — Pre-Procedure Instructions (Signed)
 Surgical Instructions   Your procedure is scheduled on Monday, July 14th. Report to [facility] Main Entrance A at 05:30 A.M., then check in with the Admitting office. Any questions or running late day of surgery: call [phone]  Questions prior to your surgery date: call [phone], Monday-Friday, 8am-4pm. If you experience any cold or flu symptoms such as cough, fever, chills, shortness of breath, etc. between now and your scheduled surgery, please notify us  at the above number.     Remember:  Do not eat or drink after midnight the night before your surgery    Take these medicines the morning of surgery with A SIP OF WATER  metoprolol  succinate (TOPROL  XL)  Timolol  Maleate eye drops TRAVATAN eye drops    May take these medicines IF NEEDED: Oxycodone  HCl    HOLD empagliflozin  (JARDIANCE ) for 72 hours prior to surgery. Last dose will be 7/10.  One week prior to surgery, STOP taking any Aleve, Naproxen, Ibuprofen , Motrin , Advil , Goody's, BC's, all herbal medications, fish oil, and non-prescription vitamins.                     Do NOT Smoke (Tobacco/Vaping) for 24 hours prior to your procedure.  If you use a CPAP at night, you may bring your mask/headgear for your overnight stay.   You will be asked to remove any contacts, glasses, piercing's, hearing aid's, dentures/partials prior to surgery. Please bring cases for these items if needed.    Patients discharged the day of surgery will not be allowed to drive home, and someone needs to stay with them for 24 hours.  SURGICAL WAITING ROOM VISITATION Patients may have no more than 2 support people in the waiting area - these visitors may rotate.   Pre-op nurse will coordinate an appropriate time for 1 ADULT support person, who may not rotate, to accompany patient in pre-op.  Children under the age of 80 must have an adult with them who is not the patient and must remain in the main waiting area with an adult.  If the  patient needs to stay at the hospital during part of their recovery, the visitor guidelines for inpatient rooms apply.  Please refer to the [facility] website for the visitor guidelines for any additional information.   If you received a COVID test during your pre-op visit  it is requested that you wear a mask when out in public, stay away from anyone that may not be feeling well and notify your surgeon if you develop symptoms. If you have been in contact with anyone that has tested positive in the last 10 days please notify you surgeon.      Pre-operative CHG Bathing Instructions   You can play a key role in reducing the risk of infection after surgery. Your skin needs to be as free of germs as possible. You can reduce the number of germs on your skin by washing with CHG (chlorhexidine  gluconate) soap before surgery. CHG is an antiseptic soap that kills germs and continues to kill germs even after washing.   DO NOT use if you have an allergy to chlorhexidine /CHG or antibacterial soaps. If your skin becomes reddened or irritated, stop using the CHG and notify one of our RNs at [phone].              TAKE A SHOWER THE NIGHT BEFORE SURGERY AND THE DAY OF SURGERY    Please keep in mind the following:  DO NOT shave, including  legs and underarms, 48 hours prior to surgery.   You may shave your face before/day of surgery.  Place clean sheets on your bed the night before surgery Use a clean washcloth (not used since being washed) for each shower. DO NOT sleep with pet's night before surgery.  CHG Shower Instructions:  Wash your face and private area with normal soap. If you choose to wash your hair, wash first with your normal shampoo.  After you use shampoo/soap, rinse your hair and body thoroughly to remove shampoo/soap residue.  Turn the water OFF and apply half the bottle of CHG soap to a CLEAN washcloth.  Apply CHG soap ONLY FROM YOUR NECK DOWN TO YOUR TOES (washing for 3-5 minutes)   DO NOT use CHG soap on face, private areas, open wounds, or sores.  Pay special attention to the area where your surgery is being performed.  If you are having back surgery, having someone wash your back for you may be helpful. Wait 2 minutes after CHG soap is applied, then you may rinse off the CHG soap.  Pat dry with a clean towel  Put on clean pajamas    Additional instructions for the day of surgery: DO NOT APPLY any lotions, deodorants, cologne, or perfumes.   Do not wear jewelry or makeup Do not wear nail polish, gel polish, artificial nails, or any other type of covering on natural nails (fingers and toes) Do not bring valuables to the hospital. [facility] is not responsible for valuables/personal belongings. Put on clean/comfortable clothes.  Please brush your teeth.  Ask your nurse before applying any prescription medications to the skin.

## 2024-02-14 NOTE — Patient Instructions (Signed)
YOU HAD AN ENDOSCOPIC PROCEDURE TODAY AT THE [facility]:   Refer to the procedure report that was given to you for any specific questions about what was found during the examination.  If the procedure report does not answer your questions, please call your gastroenterologist to clarify.  If you requested that your care partner not be given the details of your procedure findings, then the procedure report has been included in a sealed envelope for you to review at your convenience later.  **Handouts given on hemorrhoids and diverticulosis**  YOU SHOULD EXPECT: Some feelings of bloating in the abdomen. Passage of more gas than usual.  Walking can help get rid of the air that was put into your GI tract during the procedure and reduce the bloating. If you had a lower endoscopy (such as a colonoscopy or flexible sigmoidoscopy) you may notice spotting of blood in your stool or on the toilet paper. If you underwent a bowel prep for your procedure, you may not have a normal bowel movement for a few days.  Please Note:  You might notice some irritation and congestion in your nose or some drainage.  This is from the oxygen used during your procedure.  There is no need for concern and it should clear up in a day or so.  SYMPTOMS TO REPORT IMMEDIATELY:  Following lower endoscopy (colonoscopy or flexible sigmoidoscopy):  Excessive amounts of blood in the stool  Significant tenderness or worsening of abdominal pains  Swelling of the abdomen that is new, acute  Fever of 100F or higher  Following upper endoscopy (EGD)  Vomiting of blood or coffee ground material  New chest pain or pain under the shoulder blades  Painful or persistently difficult swallowing  New shortness of breath  Fever of 100F or higher  Black, tarry-looking stools  For urgent or emergent issues, a gastroenterologist can be reached at any hour by calling [phone]. Do not use MyChart messaging for urgent concerns.     DIET:  We do recommend a small meal at first, but then you may proceed to your regular diet.  Drink plenty of fluids but you should avoid alcoholic beverages for 24 hours.  ACTIVITY:  You should plan to take it easy for the rest of today and you should NOT DRIVE or use heavy machinery until tomorrow (because of the sedation medicines used during the test).    FOLLOW UP: Our staff will call the number listed on your records the next business day following your procedure.  We will call around 7:15- 8:00 am to check on you and address any questions or concerns that you may have regarding the information given to you following your procedure. If we do not reach you, we will leave a message.     If any biopsies were taken you will be contacted by phone or by letter within the next 1-3 weeks.  Please call us at [phone] if you have not heard about the biopsies in 3 weeks.    SIGNATURES/CONFIDENTIALITY: You and/or your care partner have signed paperwork which will be entered into your electronic medical record.  These signatures attest to the fact that that the information above on your After Visit Summary has been reviewed and is understood.  Full responsibility of the confidentiality of this discharge information lies with you and/or your care-partner.

## 2024-02-14 NOTE — Op Note (Signed)
 [facility] Patient Name: Brett [name] Procedure Date: 02/14/2024 1:52 PM MRN: [id number] Endoscopist: Inocente Hausen , MD, [id number] Age: 68 Referring MD:  Date of Birth: Sep 07, 1955 Gender: Male Account #: [id number] Procedure:                Colonoscopy Indications:              High risk colon cancer surveillance: Personal                            history of adenoma (10 mm or greater in size), High                            risk colon cancer surveillance: Personal history of                            multiple (3 or more) adenomas, High risk colon                            cancer surveillance: Personal history of sessile                            serrated colon polyp (less than 10 mm in size) with                            no dysplasia; Family history of colorectal cancer                            in a first-degree relative Medicines:                Monitored Anesthesia Care Procedure:                Pre-Anesthesia Assessment:                           - Prior to the procedure, a History and Physical                            was performed, and patient medications and                            allergies were reviewed. The patient's tolerance of                            previous anesthesia was also reviewed. The risks                            and benefits of the procedure and the sedation                            options and risks were discussed with the patient.                            All questions were answered, and informed consent  was obtained. Prior Anticoagulants: The patient has                            taken no anticoagulant or antiplatelet agents. ASA                            Grade Assessment: III - A patient with severe                            systemic disease. After reviewing the risks and                            benefits, the patient was deemed in satisfactory                            condition to  undergo the procedure.                           After obtaining informed consent, the colonoscope                            was passed under direct vision. Throughout the                            procedure, the patient's blood pressure, pulse, and                            oxygen saturations were monitored continuously. The                            CF [id number] #[id number] was introduced through the anus                            and advanced to the cecum, identified by                            appendiceal orifice and ileocecal valve. The                            colonoscopy was performed without difficulty. The                            patient tolerated the procedure well. The quality                            of the bowel preparation was adequate. The                            ileocecal valve, appendiceal orifice, and rectum                            were photographed. Scope In: 2:25:28 PM Scope Out: 2:45:23 PM Scope Withdrawal Time: 0 hours 13 minutes 59 seconds  Total Procedure Duration: 0 hours 19 minutes  55 seconds  Findings:                 The perianal and digital rectal examinations were                            normal. Pertinent negatives include normal                            sphincter tone and no palpable rectal lesions.                           Multiple medium-mouthed and small-mouthed                            diverticula were found in the sigmoid colon,                            transverse colon and ascending colon.                           A tattoo was seen in the proximal ascending colon.                            The tattoo site appeared normal.                           The exam was otherwise normal throughout the                            examined colon.                           Internal hemorrhoids were found during retroflexion. Complications:            No immediate complications. Estimated blood loss:                             None. Estimated Blood Loss:     Estimated blood loss: none. Impression:               - Diverticulosis in the sigmoid colon, in the                            transverse colon and in the ascending colon.                           - A tattoo was seen in the proximal ascending                            colon. The tattoo site appeared normal.                           - Internal hemorrhoids.                           - No specimens collected. Recommendation:           -  Discharge patient to home (ambulatory).                           - Repeat colonoscopy in 5 years for surveillance                            given family history of colorectal cancer in                            first-degree relative if still appropriate based                            upon medical comorbidities at that time.                           - The findings and recommendations were discussed                            with the patient's family.                           - Return to referring physician.                           - Patient has a contact number available for                            emergencies. The signs and symptoms of potential                            delayed complications were discussed with the                            patient. Return to normal activities tomorrow.                            Written discharge instructions were provided to the                            patient. Inocente Hausen, MD 02/14/2024 2:55:06 PM This report has been signed electronically.

## 2024-02-14 NOTE — Progress Notes (Signed)
 CXR is okay to obtain on 7/10 for upcoming surgery on 7/14 per Bernardino Sprang, RN

## 2024-02-14 NOTE — Progress Notes (Signed)
 Pt's states no medical or surgical changes since previsit or office visit.

## 2024-02-14 NOTE — Progress Notes (Signed)
 Called to room to assist during endoscopic procedure.  Patient ID and intended procedure confirmed with present staff. Received instructions for my participation in the procedure from the performing physician.

## 2024-02-14 NOTE — Op Note (Signed)
 [facility] Patient Name: Brett [name] Procedure Date: 02/14/2024 2:00 PM MRN: [id number] Endoscopist: Inocente Hausen , MD, [id number] Age: 68 Referring MD:  Date of Birth: 1955-08-31 Gender: Male Account #: [id number] Procedure:                Upper GI endoscopy Indications:              Abnormal PET scan of the GI tract -history of                            adenocarcinoma of the lung with hypermetabolic                            activity in mediastinal lymph nodes -evaluate for                            GI tract involvement of malignancy Medicines:                Monitored Anesthesia Care Procedure:                Pre-Anesthesia Assessment:                           - Prior to the procedure, a History and Physical                            was performed, and patient medications and                            allergies were reviewed. The patient's tolerance of                            previous anesthesia was also reviewed. The risks                            and benefits of the procedure and the sedation                            options and risks were discussed with the patient.                            All questions were answered, and informed consent                            was obtained. Prior Anticoagulants: The patient has                            taken no anticoagulant or antiplatelet agents. ASA                            Grade Assessment: III - A patient with severe                            systemic disease. After reviewing the risks and  benefits, the patient was deemed in satisfactory                            condition to undergo the procedure.                           After obtaining informed consent, the endoscope was                            passed under direct vision. Throughout the                            procedure, the patient's blood pressure, pulse, and                            oxygen saturations were  monitored continuously. The                            GIF HQ190 #[id number] was introduced through the                            mouth, and advanced to the second part of duodenum.                            The upper GI endoscopy was accomplished without                            difficulty. The patient tolerated the procedure                            well. Scope In: Scope Out: Findings:                 The examined esophagus was normal. Minimal                            esophagitis was seen in the lower esophagus.                           Scattered mild inflammation characterized by                            erosions and erythema was found in the gastric                            antrum and in the prepyloric region of the stomach.                            Biopsies were taken with a cold forceps for                            Helicobacter pylori testing.                           The gastric body, cardia (on retroflexion) and  gastric fundus (on retroflexion) were normal.                            Biopsies were taken with a cold forceps for                            Helicobacter pylori testing.                           A small hiatal hernia was present.                           Patchy moderately erythematous mucosa was found in                            the duodenal bulb. Biopsies were taken with a cold                            forceps for histology.                           The second portion of the duodenum was normal.                           No findings on EGD concerning for GI tract                            involvement of malignancy. Complications:            No immediate complications. Estimated blood loss:                            Minimal. Estimated Blood Loss:     Estimated blood loss was minimal. Impression:               - Normal esophagus. Minimal esophagitis was seen in                            the lower esophagus.                            - Gastritis. Biopsied.                           - Normal gastric body, cardia and gastric fundus.                            Biopsied.                           - Small hiatal hernia.                           - Erythematous duodenopathy. Biopsied.                           - Normal second portion of the duodenum.                           -  No findings on EGD concerning for GI tract                            involvement of malignancy Recommendation:           - Await pathology results.                           - Perform a colonoscopy today.                           - The findings and recommendations were discussed                            with the designated responsible adult. Inocente Hausen, MD 02/14/2024 2:48:16 PM This report has been signed electronically.

## 2024-02-15 ENCOUNTER — Encounter (HOSPITAL_COMMUNITY): Payer: Self-pay

## 2024-02-15 ENCOUNTER — Other Ambulatory Visit: Payer: Self-pay

## 2024-02-15 ENCOUNTER — Ambulatory Visit (HOSPITAL_COMMUNITY)
Admission: RE | Admit: 2024-02-15 | Discharge: 2024-02-15 | Disposition: A | Discharge status: Home / Self Care | Source: Ambulatory Visit | Attending: Thoracic Surgery (Cardiothoracic Vascular Surgery) | Admitting: Thoracic Surgery (Cardiothoracic Vascular Surgery)

## 2024-02-15 ENCOUNTER — Telehealth: Payer: Self-pay

## 2024-02-15 ENCOUNTER — Encounter (HOSPITAL_COMMUNITY)
Admission: RE | Admit: 2024-02-15 | Discharge: 2024-02-15 | Disposition: A | Discharge status: Home / Self Care | Source: Ambulatory Visit | Attending: Thoracic Surgery (Cardiothoracic Vascular Surgery) | Admitting: Thoracic Surgery (Cardiothoracic Vascular Surgery)

## 2024-02-15 VITALS — BP 130/81 | HR 68 | Temp 98.5°F | Resp 17 | Ht 74.0 in | Wt 168.2 lb

## 2024-02-15 DIAGNOSIS — Z01812 Encounter for preprocedural laboratory examination: Secondary | ICD-10-CM | POA: Diagnosis present

## 2024-02-15 DIAGNOSIS — C3432 Malignant neoplasm of lower lobe, left bronchus or lung: Secondary | ICD-10-CM

## 2024-02-15 DIAGNOSIS — Z87891 Personal history of nicotine dependence: Secondary | ICD-10-CM | POA: Insufficient documentation

## 2024-02-15 DIAGNOSIS — I471 Supraventricular tachycardia, unspecified: Secondary | ICD-10-CM | POA: Diagnosis not present

## 2024-02-15 DIAGNOSIS — H409 Unspecified glaucoma: Secondary | ICD-10-CM | POA: Insufficient documentation

## 2024-02-15 DIAGNOSIS — I700 Atherosclerosis of aorta: Secondary | ICD-10-CM | POA: Diagnosis not present

## 2024-02-15 DIAGNOSIS — Z981 Arthrodesis status: Secondary | ICD-10-CM | POA: Insufficient documentation

## 2024-02-15 DIAGNOSIS — Z01818 Encounter for other preprocedural examination: Secondary | ICD-10-CM | POA: Diagnosis not present

## 2024-02-15 DIAGNOSIS — R001 Bradycardia, unspecified: Secondary | ICD-10-CM | POA: Insufficient documentation

## 2024-02-15 DIAGNOSIS — Z7984 Long term (current) use of oral hypoglycemic drugs: Secondary | ICD-10-CM | POA: Diagnosis not present

## 2024-02-15 DIAGNOSIS — D110 Benign neoplasm of parotid gland: Secondary | ICD-10-CM | POA: Insufficient documentation

## 2024-02-15 DIAGNOSIS — Z79899 Other long term (current) drug therapy: Secondary | ICD-10-CM | POA: Insufficient documentation

## 2024-02-15 DIAGNOSIS — I429 Cardiomyopathy, unspecified: Secondary | ICD-10-CM | POA: Insufficient documentation

## 2024-02-15 DIAGNOSIS — J439 Emphysema, unspecified: Secondary | ICD-10-CM | POA: Insufficient documentation

## 2024-02-15 DIAGNOSIS — Z96641 Presence of right artificial hip joint: Secondary | ICD-10-CM | POA: Insufficient documentation

## 2024-02-15 DIAGNOSIS — Z7982 Long term (current) use of aspirin: Secondary | ICD-10-CM | POA: Diagnosis not present

## 2024-02-15 DIAGNOSIS — Z01810 Encounter for preprocedural cardiovascular examination: Secondary | ICD-10-CM | POA: Diagnosis present

## 2024-02-15 DIAGNOSIS — R918 Other nonspecific abnormal finding of lung field: Secondary | ICD-10-CM | POA: Diagnosis not present

## 2024-02-15 HISTORY — DX: Cardiomyopathy, unspecified: I42.9

## 2024-02-15 HISTORY — DX: Cardiac arrhythmia, unspecified: I49.9

## 2024-02-15 HISTORY — DX: Unspecified asthma, uncomplicated: J45.909

## 2024-02-15 HISTORY — PX: COLONOSCOPY: SHX174

## 2024-02-15 LAB — URINALYSIS, ROUTINE W REFLEX MICROSCOPIC
Bacteria, UA: NONE SEEN
Bilirubin Urine: NEGATIVE
Glucose, UA: >=500 mg/dL — AB
Hgb urine dipstick: NEGATIVE
Ketones, ur: NEGATIVE mg/dL
Leukocytes,Ua: NEGATIVE
Nitrite: NEGATIVE
Protein, ur: NEGATIVE mg/dL
Specific Gravity, Urine: 1.015 (ref 1.005–1.030)
pH: 7 (ref 5.0–8.0)

## 2024-02-15 LAB — TYPE AND SCREEN
ABO/RH(D): B POS
Antibody Screen: NEGATIVE

## 2024-02-15 LAB — PROTIME-INR
INR: 1 (ref 0.8–1.2)
Prothrombin Time: 13.7 s (ref 11.4–15.2)

## 2024-02-15 LAB — COMPREHENSIVE METABOLIC PANEL WITH GFR
ALT: 16 U/L (ref 0–44)
AST: 27 U/L (ref 15–41)
Albumin: 4 g/dL (ref 3.5–5.0)
Alkaline Phosphatase: 53 U/L (ref 38–126)
Anion gap: 9 (ref 5–15)
BUN: 11 mg/dL (ref 8–23)
CO2: 25 mmol/L (ref 22–32)
Calcium: 9.5 mg/dL (ref 8.9–10.3)
Chloride: 102 mmol/L (ref 98–111)
Creatinine, Ser: 1.01 mg/dL (ref 0.61–1.24)
GFR, Estimated: >60 mL/min (ref >60)
Glucose, Bld: 108 mg/dL — ABNORMAL HIGH (ref 70–99)
Potassium: 5.2 mmol/L — ABNORMAL HIGH (ref 3.5–5.1)
Sodium: 136 mmol/L (ref 135–145)
Total Bilirubin: 0.8 mg/dL (ref 0.0–1.2)
Total Protein: 7.2 g/dL (ref 6.5–8.1)

## 2024-02-15 LAB — CBC
HCT: 44.4 % (ref 39.0–52.0)
Hemoglobin: 15 g/dL (ref 13.0–17.0)
MCH: 33.8 pg (ref 26.0–34.0)
MCHC: 33.8 g/dL (ref 30.0–36.0)
MCV: 100 fL (ref 80.0–100.0)
Platelets: 188 K/uL (ref 150–400)
RBC: 4.44 MIL/uL (ref 4.22–5.81)
RDW: 12.5 % (ref 11.5–15.5)
WBC: 11.2 K/uL — ABNORMAL HIGH (ref 4.0–10.5)
nRBC: 0 % (ref 0.0–0.2)

## 2024-02-15 LAB — SURGICAL PCR SCREEN
MRSA, PCR: NEGATIVE
Staphylococcus aureus: NEGATIVE

## 2024-02-15 LAB — APTT: aPTT: 29 s (ref 24–36)

## 2024-02-15 NOTE — Progress Notes (Signed)
 PCP - Eleanor Ponto, NP Cardiologist - Dr. Alean [name] - last office visit 12/07/2023 Pulmonologist - Dr. Belva November  PPM/ICD - Denies Device Orders - n/a Rep Notified - n/a  Chest x-ray - 02/15/2024 EKG - 02/15/2024 Stress Test - 10/03/2023 ECHO - 07/12/2023 Cardiac Cath - Denies  Sleep Study - Denies CPAP - n/a  No DM  Last dose of GLP1 agonist- n/a GLP1 instructions: n/a  Blood Thinner Instructions: n/a Aspirin  Instructions: n/a  NPO after midnight  COVID TEST- n/a   Anesthesia review: Yes. Abnormal EKG review. Hx of SVT and cardiomyopathy.    Patient denies shortness of breath, fever, cough and chest pain at PAT appointment. Pt denies any respiratory illness/infection in the last two months   All instructions explained to the patient, with a verbal understanding of the material. Patient agrees to go over the instructions while at home for a better understanding. Patient also instructed to self quarantine after being tested for COVID-19. The opportunity to ask questions was provided.

## 2024-02-15 NOTE — Telephone Encounter (Signed)
  Follow up Call-     02/14/2024    1:10 PM  Call back number  Post procedure Call Back phone  # [phone]  Permission to leave phone message Yes     Patient questions:  Do you have a fever, pain , or abdominal swelling? No. Pain Score  0 *  Have you tolerated food without any problems? Yes.    Have you been able to return to your normal activities? Yes.    Do you have any questions about your discharge instructions: Diet   No. Medications  No. Follow up visit  No.  Do you have questions or concerns about your Care? No.  Actions: * If pain score is 4 or above: No action needed, pain <4.

## 2024-02-16 NOTE — Progress Notes (Signed)
 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 S/p EGD and colonoscopy on 02/14/24 with no visible findings to suggest GI malignancy, but specimen pathology is still pending. Alcohol use is documented as 14 drinks per week.  Last visit with cardiologist DR. [name] was on 12/07/23.  He was initially seen for short runs of PSVT during spinal surgery in October 2024 requiring esmolol  for termination.  Subsequent Zio patch showed asymptomatic SVT burden of 1.7% and asymptomatic runs of NSVT with burden of 1%.  He had low normal LVEF of 50 to 55% on TTE in December 2024.  He subsequently underwent a cardiac PET stress test in February 2025 which was nonischemic but with LVEF calculated at 46% with coronary artery calcifications in the LAD and RCA distributions.  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  GDMT initiated.  Abstinence from alcohol also encouraged.  He was also referred to specialty clinic for cardiac sarcoidosis evaluation, which is scheduled in late August.  In the interim he underwent video bronchoscopy and was diagnosed with LLL  adenocarcinoma. He was given cardiac clearance with permission to hold ASA if needed for that procedure which was done on 01/09/24. Patient scheduled follow-up on 03/26/24 with Dr. Madirreddy and Dr. Stanly Leavens.   Last Jardiance  02/15/24.   Anesthesia team to evaluate on the day of surgery.    VS: BP 130/81   Pulse 68   Temp 36.9 C   Resp 17   Ht 6' 2 (1.88 m)   Wt 76.3 kg   SpO2 95%   BMI 21.60 kg/m   PROVIDERS: [name], NP is PCP [name], Alean, MD is cardiologist Sherrod Sherrod, MD is [facility] Isadora Hose, MD is pulmonologist Albertus Heinz, MD is GI   LABS: Labs reviewed: Acceptable for surgery. (all labs ordered are listed, but only abnormal results are displayed)  Labs Reviewed  CBC - Abnormal; Notable for the following components:      Result Value   WBC 11.2 (*)    All other components within normal limits  COMPREHENSIVE METABOLIC PANEL WITH GFR - Abnormal; Notable for the following components:   Potassium 5.2 (*)    Glucose, Bld 108 (*)    All other components within normal limits  URINALYSIS, ROUTINE W REFLEX MICROSCOPIC - Abnormal; Notable for the following components:   Glucose, UA >=500 (*)    All other components within normal limits  SURGICAL PCR SCREEN  PROTIME-INR  APTT  TYPE AND SCREEN    PFTs 01/17/24: FVC 4.99 (96%), post 5.43 (105%). FEV1 2.03 (53%), post 2.28 (59%). DLCO unc 12.00 (40%), cor 11.90 (40%).   IMAGES: CXR 02/15/24: IMPRESSION: 1. No acute chest findings. 2. Emphysema. 3. Left lower  lobe nodules on CT are not well seen by radiograph.   MRI Brain 01/23/24: IMPRESSION: No evidence of metastatic disease. Mild cortical atrophy. Mild chronic appearing sinus disease.  CT Super D Chest 01/08/24: IMPRESSION: 1. Enlarging posterior left lower lobe nodule, hypermetabolic on 12/22/2023 and indicative of stage IA primary bronchogenic carcinoma. 2. Pure ground-glass nodule in the anterolateral left lower  lobe, stable. Recommend continued attention on follow-up as low-grade adenocarcinoma can have this appearance. 3. Aortic atherosclerosis (ICD10-I70.0). Coronary artery calcification. 4.  Emphysema (ICD10-J43.9).   MRA Neck Soft Tissue 01/08/24: IMPRESSION: Subtle T1 hypointense 1.1 cm T1 hypointensity in the inferior left parotid gland at the site of previously seen hypermetabolic activity on PET CT, suspicious for a mass. This could represent a primary parotid neoplasm or mildly enlarged intraparotid lymph node. Recommend ENT consultation for management. 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   EKG: 02/15/24: Sinus bradycardia at 55 bpm Septal infarct , age undetermined   CV: MRI Cardiac 11/15/23: IMPRESSION: 1. Mild LVE with global hypokinesis LVEF 43% 2.  Normal RV size and function RVEF 55% 3. Abnormal gadolinium uptake in basal anterior lateral wall and basal anteroseptal may represent sequelae of old myocarditis or possibly sarcoid. T2 is normal indicating non acute inflammation 4.  Mildly elevated ECV/T1 non in amyloid range 5.  Estimated cardiac output 3.1 L/min 6.  Mild MV thickening with trivial MR    NM PET CT Cardiac Perfusion Study 10/03/23:   LV perfusion is normal. There is no evidence of ischemia. There is no evidence of infarction.   Rest left ventricular function is abnormal. Rest global function is moderately reduced. There were no regional wall motion abnormalities. Rest EF: 30%. Stress left ventricular function is abnormal. Stress global function is mildly reduced. There were no regional wall abnormalities. Stress EF: 46%. End diastolic cavity size is mildly enlarged. End systolic cavity size is mildly enlarged.   Myocardial blood flow was computed to be 0.92ml/g/min at rest and 2.93ml/g/min at stress. Global myocardial blood flow reserve was 2.59 and was normal.   Coronary calcium  was present on the attenuation correction CT images.  Severe coronary calcifications were present. Coronary calcifications were present in the left anterior descending artery and right coronary artery distribution(s).   Findings are consistent with no ischemia and no infarction. The study is intermediate risk. based on EF note EF 50-55% on recent TTE can consider cardiac MRI to further assess   Normal pefusion and MBFR. Abnormal resting / stress EF Recent echo EF 50-55% Can consider f/u cardiac MRI to further assess Severe calcium  noted in RCA/LAD   Echo 07/12/23: IMPRESSIONS   1. Left ventricular ejection fraction, by estimation, is 50 to 55%. The  left ventricle has low normal function. The left ventricle has no regional  wall motion abnormalities. Left ventricular diastolic parameters are  consistent with Grade I diastolic  dysfunction (impaired relaxation).   2. Right ventricular systolic function is normal. The right ventricular  size is normal.   3. The mitral valve is normal in structure. No evidence of mitral valve  regurgitation. No evidence of mitral stenosis.   4. The aortic valve is normal in structure. Aortic valve regurgitation is  not visualized. No aortic stenosis is present.   5. The inferior vena cava is normal in size with greater than 50%  respiratory variability, suggesting right atrial pressure of 3 mmHg.    Long term monitor 06/19/23 - 07/03/23:   Predominantly sinus rhythm,  average heart rate 87/min [ranging from 55 bpm to 113 bpm].   Supraventricular ectopy burden 1.7%, with 4 short runs of supraventricular tachycardia with the longest episode lasting 17 beats.   Ventricular ectopy burden less than 1%, with one short run of nonsustained ventricular tachycardia lasting 10 beats.   No symptoms reported and no patient triggered events.  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 08/06/2023   Pain of left hip joint 11/01/2018   Paroxysmal SVT (supraventricular tachycardia) (HCC), incidental finding IntraOp back surgery requiring esmolol ; event monitor November 2024 for short runs, longest episode 17 beats. 06/19/2023   Event monitor November 2024, supraventricular ectopy burden 1.7%.  4 short runs of SVT, longest episode 17 beats.  No symptoms reported     Personal history of congenital hip dysplasia 05/30/2013   Preventative health care 01/29/2015   Rhinitis, allergic 09/22/2015   Rosacea 05/30/2013   Scoliosis deformity of spine 05/05/2020   Spinal stenosis of lumbar region 06/16/2023   Spondylolisthesis at L5-S1 level 09/14/2020   Tobacco abuse 05/30/2013    Past Surgical History:  Procedure Laterality Date   ANTERIOR LAT LUMBAR FUSION Right 05/09/2023   Procedure: Extreme Lateral Interbody Fusion Lumbar three-four -right;  Surgeon: Louis Shove, MD;  Location: [facility] OR;  Service: Neurosurgery;  Laterality: Right;  3C   BRONCHOSCOPY, WITH BIOPSY USING ELECTROMAGNETIC NAVIGATION Bilateral 01/09/2024   Procedure: ROBOTIC ASSISTED NAVIGATIONAL BRONCHOSCOPY;  Surgeon: Isadora Hose, MD;  Location: [facility] ORS;  Service: Pulmonary;   Laterality: Bilateral;   COLONOSCOPY  06/17/2015   Pyrtle   COLONOSCOPY  07/20/2020   Pyrtle   COLONOSCOPY  02/15/2024   ENDOBRONCHIAL ULTRASOUND Bilateral 01/09/2024   Procedure: ENDOBRONCHIAL ULTRASOUND (EBUS);  Surgeon: Isadora Hose, MD;  Location: [facility] ORS;  Service: Pulmonary;  Laterality: Bilateral;   HAMMER TOE SURGERY  07/28/2011   Procedure: HAMMER TOE CORRECTION;  Surgeon: Toribio JULIANNA Chancy, MD;  Location: [facility];  Service: Orthopedics;  Laterality: Right;  right 2nd and 4th toes correction hammer toe, capsulotomy metatarsal-phalangeal joints   HERNIA REPAIR Bilateral    HIP SURGERY  1986 & 2010   rt total hip-8/10-multiple rt hip surgeries- had 4 prior to 1986 as a child   JOINT REPLACEMENT  2010   Partial Right Hip   lumber fusion  09/2020   POLYPECTOMY     SPINE SURGERY  09/14/2020   THUMB ARTHROSCOPY  2008   rt   TOTAL HIP ARTHROPLASTY Right 1985    MEDICATIONS:  aspirin  EC 81 MG tablet   calcium  carbonate (TUMS  EX) 750 MG chewable tablet   empagliflozin  (JARDIANCE ) 10 MG TABS tablet   metoprolol  succinate (TOPROL  XL) 25 MG 24 hr tablet   Multiple Vitamin (MULTIVITAMIN WITH MINERALS) TABS tablet   Na Sulfate-K Sulfate-Mg Sulfate concentrate (SUPREP) 17.5-3.13-1.6 GM/177ML SOLN   Oxycodone  HCl 10 MG TABS   sacubitril-valsartan (ENTRESTO ) 24-26 MG   STELARA 45 MG/0.5ML injection   Timolol  Maleate, Once-Daily, 0.5 % SOLN   TRAVATAN Z 0.004 % SOLN ophthalmic solution   varenicline  (CHANTIX  CONTINUING MONTH PAK) 1 MG tablet   No current facility-administered medications for this encounter.    Isaiah Ruder, PA-C Surgical Short Stay/Anesthesiology [facility] Phone [phone] [facility] Phone [phone] 02/16/2024 10:06 AM

## 2024-02-19 ENCOUNTER — Other Ambulatory Visit: Payer: Self-pay

## 2024-02-19 ENCOUNTER — Inpatient Hospital Stay (HOSPITAL_COMMUNITY): Payer: Self-pay | Admitting: Vascular Surgery

## 2024-02-19 ENCOUNTER — Encounter (HOSPITAL_COMMUNITY): Payer: Self-pay | Admitting: Thoracic Surgery (Cardiothoracic Vascular Surgery)

## 2024-02-19 ENCOUNTER — Inpatient Hospital Stay (HOSPITAL_COMMUNITY): Payer: Self-pay | Admitting: Certified Registered Nurse Anesthetist

## 2024-02-19 ENCOUNTER — Inpatient Hospital Stay (HOSPITAL_COMMUNITY)

## 2024-02-19 ENCOUNTER — Encounter: Payer: Medicare HMO | Admitting: Family

## 2024-02-19 ENCOUNTER — Ambulatory Visit: Payer: Self-pay | Admitting: Pediatrics

## 2024-02-19 ENCOUNTER — Inpatient Hospital Stay (HOSPITAL_COMMUNITY)
Admission: RE | Admit: 2024-02-19 | Discharge: 2024-03-03 | DRG: 163 | Disposition: A | Discharge status: Home Health | Attending: Thoracic Surgery (Cardiothoracic Vascular Surgery) | Admitting: Thoracic Surgery (Cardiothoracic Vascular Surgery)

## 2024-02-19 ENCOUNTER — Encounter (HOSPITAL_COMMUNITY)
Admission: RE | Disposition: A | Discharge status: Home Health | Payer: Self-pay | Source: Home / Self Care | Attending: Thoracic Surgery (Cardiothoracic Vascular Surgery)

## 2024-02-19 DIAGNOSIS — Z981 Arthrodesis status: Secondary | ICD-10-CM | POA: Diagnosis not present

## 2024-02-19 DIAGNOSIS — J95812 Postprocedural air leak: Secondary | ICD-10-CM | POA: Diagnosis not present

## 2024-02-19 DIAGNOSIS — T797XXA Traumatic subcutaneous emphysema, initial encounter: Secondary | ICD-10-CM | POA: Diagnosis not present

## 2024-02-19 DIAGNOSIS — C3432 Malignant neoplasm of lower lobe, left bronchus or lung: Principal | ICD-10-CM | POA: Diagnosis present

## 2024-02-19 DIAGNOSIS — D0222 Carcinoma in situ of left bronchus and lung: Secondary | ICD-10-CM | POA: Diagnosis not present

## 2024-02-19 DIAGNOSIS — I2510 Atherosclerotic heart disease of native coronary artery without angina pectoris: Secondary | ICD-10-CM | POA: Diagnosis not present

## 2024-02-19 DIAGNOSIS — Z7982 Long term (current) use of aspirin: Secondary | ICD-10-CM

## 2024-02-19 DIAGNOSIS — K219 Gastro-esophageal reflux disease without esophagitis: Secondary | ICD-10-CM | POA: Diagnosis not present

## 2024-02-19 DIAGNOSIS — L03114 Cellulitis of left upper limb: Secondary | ICD-10-CM | POA: Diagnosis not present

## 2024-02-19 DIAGNOSIS — Z79620 Long term (current) use of immunosuppressive biologic: Secondary | ICD-10-CM | POA: Diagnosis not present

## 2024-02-19 DIAGNOSIS — Z4682 Encounter for fitting and adjustment of non-vascular catheter: Secondary | ICD-10-CM | POA: Diagnosis not present

## 2024-02-19 DIAGNOSIS — I480 Paroxysmal atrial fibrillation: Secondary | ICD-10-CM | POA: Diagnosis not present

## 2024-02-19 DIAGNOSIS — Z87891 Personal history of nicotine dependence: Secondary | ICD-10-CM

## 2024-02-19 DIAGNOSIS — E871 Hypo-osmolality and hyponatremia: Secondary | ICD-10-CM | POA: Diagnosis not present

## 2024-02-19 DIAGNOSIS — Z902 Acquired absence of lung [part of]: Secondary | ICD-10-CM | POA: Diagnosis not present

## 2024-02-19 DIAGNOSIS — K5900 Constipation, unspecified: Secondary | ICD-10-CM | POA: Diagnosis not present

## 2024-02-19 DIAGNOSIS — Y838 Other surgical procedures as the cause of abnormal reaction of the patient, or of later complication, without mention of misadventure at the time of the procedure: Secondary | ICD-10-CM | POA: Diagnosis not present

## 2024-02-19 DIAGNOSIS — I5032 Chronic diastolic (congestive) heart failure: Secondary | ICD-10-CM | POA: Diagnosis present

## 2024-02-19 DIAGNOSIS — Z800 Family history of malignant neoplasm of digestive organs: Secondary | ICD-10-CM

## 2024-02-19 DIAGNOSIS — J9382 Other air leak: Secondary | ICD-10-CM | POA: Diagnosis not present

## 2024-02-19 DIAGNOSIS — J90 Pleural effusion, not elsewhere classified: Secondary | ICD-10-CM | POA: Diagnosis not present

## 2024-02-19 DIAGNOSIS — R918 Other nonspecific abnormal finding of lung field: Secondary | ICD-10-CM | POA: Diagnosis not present

## 2024-02-19 DIAGNOSIS — I110 Hypertensive heart disease with heart failure: Secondary | ICD-10-CM | POA: Diagnosis not present

## 2024-02-19 DIAGNOSIS — E46 Unspecified protein-calorie malnutrition: Secondary | ICD-10-CM | POA: Diagnosis present

## 2024-02-19 DIAGNOSIS — J9811 Atelectasis: Secondary | ICD-10-CM | POA: Diagnosis not present

## 2024-02-19 DIAGNOSIS — Z79899 Other long term (current) drug therapy: Secondary | ICD-10-CM

## 2024-02-19 DIAGNOSIS — J984 Other disorders of lung: Secondary | ICD-10-CM | POA: Diagnosis not present

## 2024-02-19 DIAGNOSIS — Z818 Family history of other mental and behavioral disorders: Secondary | ICD-10-CM

## 2024-02-19 DIAGNOSIS — Z7984 Long term (current) use of oral hypoglycemic drugs: Secondary | ICD-10-CM

## 2024-02-19 DIAGNOSIS — I4719 Other supraventricular tachycardia: Secondary | ICD-10-CM | POA: Diagnosis present

## 2024-02-19 DIAGNOSIS — J929 Pleural plaque without asbestos: Secondary | ICD-10-CM | POA: Diagnosis not present

## 2024-02-19 DIAGNOSIS — J9601 Acute respiratory failure with hypoxia: Secondary | ICD-10-CM | POA: Diagnosis not present

## 2024-02-19 DIAGNOSIS — J45909 Unspecified asthma, uncomplicated: Secondary | ICD-10-CM | POA: Diagnosis not present

## 2024-02-19 DIAGNOSIS — I429 Cardiomyopathy, unspecified: Secondary | ICD-10-CM

## 2024-02-19 DIAGNOSIS — G8929 Other chronic pain: Secondary | ICD-10-CM | POA: Diagnosis present

## 2024-02-19 DIAGNOSIS — J810 Acute pulmonary edema: Secondary | ICD-10-CM | POA: Diagnosis not present

## 2024-02-19 DIAGNOSIS — J439 Emphysema, unspecified: Secondary | ICD-10-CM | POA: Diagnosis not present

## 2024-02-19 DIAGNOSIS — Z8349 Family history of other endocrine, nutritional and metabolic diseases: Secondary | ICD-10-CM

## 2024-02-19 DIAGNOSIS — I428 Other cardiomyopathies: Secondary | ICD-10-CM | POA: Diagnosis not present

## 2024-02-19 DIAGNOSIS — Z83719 Family history of colon polyps, unspecified: Secondary | ICD-10-CM

## 2024-02-19 DIAGNOSIS — I808 Phlebitis and thrombophlebitis of other sites: Secondary | ICD-10-CM | POA: Diagnosis not present

## 2024-02-19 DIAGNOSIS — I10 Essential (primary) hypertension: Secondary | ICD-10-CM | POA: Diagnosis not present

## 2024-02-19 DIAGNOSIS — J942 Hemothorax: Secondary | ICD-10-CM | POA: Diagnosis not present

## 2024-02-19 DIAGNOSIS — H409 Unspecified glaucoma: Secondary | ICD-10-CM | POA: Diagnosis present

## 2024-02-19 DIAGNOSIS — Z96641 Presence of right artificial hip joint: Secondary | ICD-10-CM | POA: Diagnosis present

## 2024-02-19 DIAGNOSIS — I471 Supraventricular tachycardia, unspecified: Secondary | ICD-10-CM

## 2024-02-19 DIAGNOSIS — C3492 Malignant neoplasm of unspecified part of left bronchus or lung: Secondary | ICD-10-CM | POA: Diagnosis not present

## 2024-02-19 DIAGNOSIS — F17210 Nicotine dependence, cigarettes, uncomplicated: Secondary | ICD-10-CM | POA: Diagnosis not present

## 2024-02-19 DIAGNOSIS — Z79891 Long term (current) use of opiate analgesic: Secondary | ICD-10-CM

## 2024-02-19 DIAGNOSIS — Z7901 Long term (current) use of anticoagulants: Secondary | ICD-10-CM | POA: Diagnosis not present

## 2024-02-19 DIAGNOSIS — J939 Pneumothorax, unspecified: Secondary | ICD-10-CM | POA: Diagnosis not present

## 2024-02-19 DIAGNOSIS — Z716 Tobacco abuse counseling: Secondary | ICD-10-CM

## 2024-02-19 DIAGNOSIS — Z811 Family history of alcohol abuse and dependence: Secondary | ICD-10-CM

## 2024-02-19 DIAGNOSIS — Z6820 Body mass index (BMI) 20.0-20.9, adult: Secondary | ICD-10-CM

## 2024-02-19 DIAGNOSIS — L719 Rosacea, unspecified: Secondary | ICD-10-CM | POA: Diagnosis present

## 2024-02-19 DIAGNOSIS — Z48813 Encounter for surgical aftercare following surgery on the respiratory system: Secondary | ICD-10-CM | POA: Diagnosis not present

## 2024-02-19 DIAGNOSIS — I4891 Unspecified atrial fibrillation: Secondary | ICD-10-CM | POA: Diagnosis not present

## 2024-02-19 DIAGNOSIS — J449 Chronic obstructive pulmonary disease, unspecified: Secondary | ICD-10-CM | POA: Diagnosis not present

## 2024-02-19 HISTORY — PX: INTERCOSTAL NERVE BLOCK: SHX5021

## 2024-02-19 HISTORY — PX: LOBECTOMY, LUNG, ROBOT-ASSISTED, USING VATS: SHX7607

## 2024-02-19 HISTORY — PX: SENTINEL NODE BIOPSY: SHX6608

## 2024-02-19 LAB — SURGICAL PATHOLOGY

## 2024-02-19 SURGERY — LOBECTOMY, LUNG, ROBOT-ASSISTED, USING VATS
Anesthesia: General | Site: Chest | Laterality: Left

## 2024-02-19 MED ORDER — BUPIVACAINE LIPOSOME 1.3 % IJ SUSP
INTRAMUSCULAR | Status: AC
  Filled 2024-02-19: qty 20

## 2024-02-19 MED ORDER — ACETAMINOPHEN 10 MG/ML IV SOLN
INTRAVENOUS | Status: AC
  Filled 2024-02-19: qty 100

## 2024-02-19 MED ORDER — KETOROLAC TROMETHAMINE 15 MG/ML IJ SOLN
INTRAMUSCULAR | Status: AC
  Filled 2024-02-19: qty 1

## 2024-02-19 MED ORDER — LIDOCAINE 2% (20 MG/ML) 5 ML SYRINGE
INTRAMUSCULAR | Status: AC
  Filled 2024-02-19: qty 20

## 2024-02-19 MED ORDER — FENTANYL CITRATE (PF) 100 MCG/2ML IJ SOLN
25.0000 ug | INTRAMUSCULAR | Status: DC | PRN
  Administered 2024-02-19 (×4): 25 ug via INTRAVENOUS

## 2024-02-19 MED ORDER — SUGAMMADEX SODIUM 200 MG/2ML IV SOLN
INTRAVENOUS | Status: DC | PRN
  Administered 2024-02-19: 200 mg via INTRAVENOUS

## 2024-02-19 MED ORDER — CEFAZOLIN SODIUM-DEXTROSE 2-4 GM/100ML-% IV SOLN
2.0000 g | INTRAVENOUS | Status: AC
  Administered 2024-02-19: 2 g via INTRAVENOUS
  Filled 2024-02-19: qty 100

## 2024-02-19 MED ORDER — MIDAZOLAM HCL 2 MG/2ML IJ SOLN
INTRAMUSCULAR | Status: AC
  Filled 2024-02-19: qty 2

## 2024-02-19 MED ORDER — SODIUM CHLORIDE 0.9 % IV SOLN: INTRAVENOUS | Status: AC

## 2024-02-19 MED ORDER — MIDAZOLAM HCL 2 MG/2ML IJ SOLN
INTRAMUSCULAR | Status: DC | PRN
  Administered 2024-02-19: 2 mg via INTRAVENOUS

## 2024-02-19 MED ORDER — PHENYLEPHRINE HCL-NACL 20-0.9 MG/250ML-% IV SOLN
INTRAVENOUS | Status: AC
  Filled 2024-02-19: qty 250

## 2024-02-19 MED ORDER — DEXAMETHASONE SODIUM PHOSPHATE 10 MG/ML IJ SOLN
INTRAMUSCULAR | Status: AC
  Filled 2024-02-19: qty 2

## 2024-02-19 MED ORDER — ONDANSETRON HCL 4 MG/2ML IJ SOLN
INTRAMUSCULAR | Status: AC
  Filled 2024-02-19: qty 2

## 2024-02-19 MED ORDER — SODIUM CHLORIDE 0.9% IV SOLUTION
INTRAVENOUS | Status: AC | PRN
  Administered 2024-02-19: 1000 mL via INTRAMUSCULAR

## 2024-02-19 MED ORDER — DEXAMETHASONE SODIUM PHOSPHATE 10 MG/ML IJ SOLN
INTRAMUSCULAR | Status: DC | PRN
  Administered 2024-02-19: 10 mg via INTRAVENOUS

## 2024-02-19 MED ORDER — FENTANYL CITRATE (PF) 250 MCG/5ML IJ SOLN
INTRAMUSCULAR | Status: DC | PRN
  Administered 2024-02-19: 100 ug via INTRAVENOUS
  Administered 2024-02-19: 25 ug via INTRAVENOUS
  Administered 2024-02-19: 50 ug via INTRAVENOUS

## 2024-02-19 MED ORDER — ONDANSETRON HCL 4 MG/2ML IJ SOLN: 4.0000 mg | Freq: Four times a day (QID) | INTRAMUSCULAR | Status: DC | PRN

## 2024-02-19 MED ORDER — CHLORHEXIDINE GLUCONATE 0.12 % MT SOLN
15.0000 mL | Freq: Once | OROMUCOSAL | Status: AC
  Administered 2024-02-19: 15 mL via OROMUCOSAL
  Filled 2024-02-19: qty 15

## 2024-02-19 MED ORDER — SENNOSIDES-DOCUSATE SODIUM 8.6-50 MG PO TABS
1.0000 | ORAL_TABLET | Freq: Every day | ORAL | Status: DC
  Administered 2024-02-19 – 2024-03-02 (×10): 1 via ORAL
  Filled 2024-02-19 (×13): qty 1

## 2024-02-19 MED ORDER — EPHEDRINE 5 MG/ML INJ
INTRAVENOUS | Status: AC
Start: 2024-02-19 — End: 2024-02-19
  Filled 2024-02-19: qty 5

## 2024-02-19 MED ORDER — ROCURONIUM BROMIDE 10 MG/ML (PF) SYRINGE
PREFILLED_SYRINGE | INTRAVENOUS | Status: AC
Start: 2024-02-19 — End: 2024-02-19
  Filled 2024-02-19: qty 20

## 2024-02-19 MED ORDER — LATANOPROST 0.005 % OP SOLN
1.0000 [drp] | Freq: Every morning | OPHTHALMIC | Status: DC
  Administered 2024-02-20 – 2024-03-02 (×12): 1 [drp] via OPHTHALMIC
  Filled 2024-02-19 (×2): qty 2.5

## 2024-02-19 MED ORDER — LIDOCAINE 2% (20 MG/ML) 5 ML SYRINGE
INTRAMUSCULAR | Status: AC
  Filled 2024-02-19: qty 5

## 2024-02-19 MED ORDER — BUPIVACAINE HCL (PF) 0.5 % IJ SOLN
INTRAMUSCULAR | Status: AC
  Filled 2024-02-19: qty 30

## 2024-02-19 MED ORDER — OXYCODONE HCL 5 MG PO TABS: 5.0000 mg | ORAL_TABLET | Freq: Once | ORAL | Status: DC | PRN

## 2024-02-19 MED ORDER — OXYCODONE HCL 5 MG PO TABS
5.0000 mg | ORAL_TABLET | ORAL | Status: DC | PRN
  Administered 2024-02-19 – 2024-02-20 (×6): 10 mg via ORAL
  Filled 2024-02-19 (×6): qty 2

## 2024-02-19 MED ORDER — DEXAMETHASONE SODIUM PHOSPHATE 10 MG/ML IJ SOLN
INTRAMUSCULAR | Status: AC
  Filled 2024-02-19: qty 1

## 2024-02-19 MED ORDER — PHENYLEPHRINE HCL-NACL 20-0.9 MG/250ML-% IV SOLN
INTRAVENOUS | Status: DC | PRN
  Administered 2024-02-19: 20 ug/min via INTRAVENOUS

## 2024-02-19 MED ORDER — SUGAMMADEX SODIUM 200 MG/2ML IV SOLN
INTRAVENOUS | Status: AC
  Filled 2024-02-19: qty 2

## 2024-02-19 MED ORDER — TRAMADOL HCL 50 MG PO TABS
50.0000 mg | ORAL_TABLET | Freq: Four times a day (QID) | ORAL | Status: DC | PRN
  Administered 2024-02-19 – 2024-02-21 (×3): 100 mg via ORAL
  Administered 2024-02-21: 50 mg via ORAL
  Administered 2024-02-22: 100 mg via ORAL
  Administered 2024-02-23: 50 mg via ORAL
  Filled 2024-02-19 (×2): qty 2
  Filled 2024-02-19 (×2): qty 1
  Filled 2024-02-19 (×2): qty 2

## 2024-02-19 MED ORDER — SODIUM CHLORIDE FLUSH 0.9 % IV SOLN
INTRAVENOUS | Status: DC | PRN
  Administered 2024-02-19: 100 mL

## 2024-02-19 MED ORDER — PROPOFOL 10 MG/ML IV BOLUS
INTRAVENOUS | Status: AC
  Filled 2024-02-19: qty 20

## 2024-02-19 MED ORDER — SODIUM CHLORIDE (PF) 0.9 % IJ SOLN
INTRAMUSCULAR | Status: AC
  Filled 2024-02-19: qty 50

## 2024-02-19 MED ORDER — EPHEDRINE SULFATE (PRESSORS) 50 MG/ML IJ SOLN
INTRAMUSCULAR | Status: DC | PRN
  Administered 2024-02-19: 7.5 mg via INTRAVENOUS

## 2024-02-19 MED ORDER — ACETAMINOPHEN 160 MG/5ML PO SOLN
1000.0000 mg | Freq: Four times a day (QID) | ORAL | Status: AC
  Administered 2024-02-23: 1000 mg via ORAL
  Filled 2024-02-19: qty 40.6

## 2024-02-19 MED ORDER — CHLORHEXIDINE GLUCONATE CLOTH 2 % EX PADS
6.0000 | MEDICATED_PAD | Freq: Every day | CUTANEOUS | Status: DC
  Administered 2024-02-19 – 2024-03-01 (×12): 6 via TOPICAL

## 2024-02-19 MED ORDER — FENTANYL CITRATE (PF) 100 MCG/2ML IJ SOLN
INTRAMUSCULAR | Status: AC
  Filled 2024-02-19: qty 2

## 2024-02-19 MED ORDER — BISACODYL 5 MG PO TBEC
10.0000 mg | DELAYED_RELEASE_TABLET | Freq: Every day | ORAL | Status: DC
  Administered 2024-02-21 – 2024-03-03 (×12): 10 mg via ORAL
  Filled 2024-02-19 (×12): qty 2

## 2024-02-19 MED ORDER — SUCCINYLCHOLINE CHLORIDE 200 MG/10ML IV SOSY
PREFILLED_SYRINGE | INTRAVENOUS | Status: AC
Start: 2024-02-19 — End: 2024-02-19
  Filled 2024-02-19: qty 10

## 2024-02-19 MED ORDER — GABAPENTIN 300 MG PO CAPS
300.0000 mg | ORAL_CAPSULE | Freq: Every day | ORAL | Status: AC
  Administered 2024-02-19 – 2024-02-21 (×3): 300 mg via ORAL
  Filled 2024-02-19 (×3): qty 1

## 2024-02-19 MED ORDER — ROCURONIUM BROMIDE 10 MG/ML (PF) SYRINGE
PREFILLED_SYRINGE | INTRAVENOUS | Status: AC
  Filled 2024-02-19: qty 10

## 2024-02-19 MED ORDER — TIMOLOL MALEATE 0.5 % OP SOLN
1.0000 [drp] | Freq: Two times a day (BID) | OPHTHALMIC | Status: DC
  Administered 2024-02-19 – 2024-03-02 (×24): 1 [drp] via OPHTHALMIC
  Filled 2024-02-19 (×2): qty 5

## 2024-02-19 MED ORDER — PROPOFOL 10 MG/ML IV BOLUS
INTRAVENOUS | Status: DC | PRN
  Administered 2024-02-19: 150 mg via INTRAVENOUS
  Administered 2024-02-19: 50 mg via INTRAVENOUS

## 2024-02-19 MED ORDER — KETOROLAC TROMETHAMINE 15 MG/ML IJ SOLN
15.0000 mg | Freq: Four times a day (QID) | INTRAMUSCULAR | Status: AC
  Administered 2024-02-19 – 2024-02-21 (×8): 15 mg via INTRAVENOUS
  Filled 2024-02-19 (×7): qty 1

## 2024-02-19 MED ORDER — PANTOPRAZOLE SODIUM 40 MG PO TBEC
40.0000 mg | DELAYED_RELEASE_TABLET | Freq: Every day | ORAL | Status: DC
  Administered 2024-02-20 – 2024-03-03 (×13): 40 mg via ORAL
  Filled 2024-02-19 (×13): qty 1

## 2024-02-19 MED ORDER — METOPROLOL SUCCINATE ER 25 MG PO TB24
25.0000 mg | ORAL_TABLET | Freq: Every day | ORAL | Status: DC
  Administered 2024-02-20 – 2024-03-03 (×12): 25 mg via ORAL
  Filled 2024-02-19 (×13): qty 1

## 2024-02-19 MED ORDER — ENOXAPARIN SODIUM 40 MG/0.4ML IJ SOSY
40.0000 mg | PREFILLED_SYRINGE | Freq: Every day | INTRAMUSCULAR | Status: DC
  Administered 2024-02-19 – 2024-02-22 (×4): 40 mg via SUBCUTANEOUS
  Filled 2024-02-19 (×4): qty 0.4

## 2024-02-19 MED ORDER — PHENYLEPHRINE 80 MCG/ML (10ML) SYRINGE FOR IV PUSH (FOR BLOOD PRESSURE SUPPORT)
PREFILLED_SYRINGE | INTRAVENOUS | Status: AC
  Filled 2024-02-19: qty 10

## 2024-02-19 MED ORDER — 0.9 % SODIUM CHLORIDE (POUR BTL) OPTIME
TOPICAL | Status: DC | PRN
  Administered 2024-02-19: 2000 mL

## 2024-02-19 MED ORDER — ORAL CARE MOUTH RINSE: 15.0000 mL | Freq: Once | OROMUCOSAL | Status: AC

## 2024-02-19 MED ORDER — OXYCODONE HCL 5 MG/5ML PO SOLN: 5.0000 mg | Freq: Once | ORAL | Status: DC | PRN

## 2024-02-19 MED ORDER — GABAPENTIN 300 MG PO CAPS
300.0000 mg | ORAL_CAPSULE | Freq: Two times a day (BID) | ORAL | Status: DC
  Administered 2024-02-22 – 2024-03-03 (×19): 300 mg via ORAL
  Filled 2024-02-19 (×19): qty 1

## 2024-02-19 MED ORDER — HEMOSTATIC AGENTS (NO CHARGE) OPTIME
TOPICAL | Status: DC | PRN
  Administered 2024-02-19: 1 via TOPICAL

## 2024-02-19 MED ORDER — LACTATED RINGERS IV SOLN
INTRAVENOUS | Status: DC | PRN
Start: 2024-02-19 — End: 2024-02-19

## 2024-02-19 MED ORDER — ACETAMINOPHEN 500 MG PO TABS
1000.0000 mg | ORAL_TABLET | Freq: Four times a day (QID) | ORAL | Status: AC
Start: 2024-02-19 — End: 2024-02-24
  Administered 2024-02-19 – 2024-02-24 (×14): 1000 mg via ORAL
  Filled 2024-02-19 (×16): qty 2

## 2024-02-19 MED ORDER — FENTANYL CITRATE (PF) 250 MCG/5ML IJ SOLN
INTRAMUSCULAR | Status: AC
  Filled 2024-02-19: qty 5

## 2024-02-19 MED ORDER — EPHEDRINE SULFATE-NACL 50-0.9 MG/10ML-% IV SOSY
PREFILLED_SYRINGE | INTRAVENOUS | Status: DC | PRN
  Administered 2024-02-19: 7.5 mg via INTRAVENOUS

## 2024-02-19 MED ORDER — LACTATED RINGERS IV SOLN: INTRAVENOUS | Status: DC

## 2024-02-19 MED ORDER — ROCURONIUM BROMIDE 10 MG/ML (PF) SYRINGE
PREFILLED_SYRINGE | INTRAVENOUS | Status: DC | PRN
  Administered 2024-02-19: 60 mg via INTRAVENOUS
  Administered 2024-02-19: 40 mg via INTRAVENOUS
  Administered 2024-02-19: 10 mg via INTRAVENOUS

## 2024-02-19 MED ORDER — ONDANSETRON HCL 4 MG/2ML IJ SOLN
INTRAMUSCULAR | Status: DC | PRN
  Administered 2024-02-19: 4 mg via INTRAVENOUS

## 2024-02-19 MED ORDER — LIDOCAINE 2% (20 MG/ML) 5 ML SYRINGE
INTRAMUSCULAR | Status: DC | PRN
  Administered 2024-02-19: 60 mg via INTRAVENOUS

## 2024-02-19 MED ORDER — EPHEDRINE 5 MG/ML INJ
INTRAVENOUS | Status: AC
  Filled 2024-02-19: qty 5

## 2024-02-19 MED ORDER — ACETAMINOPHEN 10 MG/ML IV SOLN
1000.0000 mg | Freq: Once | INTRAVENOUS | Status: AC
  Administered 2024-02-19: 1000 mg via INTRAVENOUS

## 2024-02-19 SURGICAL SUPPLY — 74 items
BLADE CLIPPER SURG (BLADE) IMPLANT
CANISTER SUCTION 3000ML PPV (SUCTIONS) ×4 IMPLANT
CANNULA REDUCER 12-8 DVNC XI (CANNULA) ×4 IMPLANT
CATH THORACIC 28FR (CATHETERS) IMPLANT
CLIP TI WIDE RED SMALL 6 (CLIP) IMPLANT
CNTNR URN SCR LID CUP LEK RST (MISCELLANEOUS) ×10 IMPLANT
CONN ST 1/4X3/8 BEN (MISCELLANEOUS) IMPLANT
DEFOGGER SCOPE WARM SEASHARP (MISCELLANEOUS) ×2 IMPLANT
DERMABOND ADVANCED .7 DNX12 (GAUZE/BANDAGES/DRESSINGS) ×2 IMPLANT
DRAIN CHANNEL 28F RND 3/8 FF (WOUND CARE) IMPLANT
DRAPE ARM DVNC X/XI (DISPOSABLE) ×8 IMPLANT
DRAPE COLUMN DVNC XI (DISPOSABLE) ×2 IMPLANT
DRAPE CV SPLIT W-CLR ANES SCRN (DRAPES) ×2 IMPLANT
DRAPE HALF SHEET 40X57 (DRAPES) ×2 IMPLANT
DRAPE INCISE IOBAN 66X45 STRL (DRAPES) IMPLANT
DRAPE SURG ORHT 6 SPLT 77X108 (DRAPES) ×2 IMPLANT
ELECT BLADE 6.5 EXT (BLADE) ×2 IMPLANT
ELECTRODE REM PT RTRN 9FT ADLT (ELECTROSURGICAL) ×2 IMPLANT
FORCEPS BPLR FENES DVNC XI (FORCEP) IMPLANT
FORCEPS BPLR LNG DVNC XI (INSTRUMENTS) IMPLANT
GAUZE KITTNER 4X5 RF (MISCELLANEOUS) ×4 IMPLANT
GAUZE SPONGE 4X4 12PLY STRL (GAUZE/BANDAGES/DRESSINGS) ×2 IMPLANT
GLOVE SS BIOGEL STRL SZ 7.5 (GLOVE) ×4 IMPLANT
GLOVE SURG POLYISO LF SZ8 (GLOVE) ×2 IMPLANT
GOWN STRL REUS W/ TWL LRG LVL3 (GOWN DISPOSABLE) ×4 IMPLANT
GOWN STRL REUS W/ TWL XL LVL3 (GOWN DISPOSABLE) ×4 IMPLANT
GOWN STRL REUS W/TWL 2XL LVL3 (GOWN DISPOSABLE) ×2 IMPLANT
GRASPER TIP-UP FEN DVNC XI (INSTRUMENTS) IMPLANT
HEMOSTAT SURGICEL 2X14 (HEMOSTASIS) ×6 IMPLANT
IRRIGATION STRYKERFLOW (MISCELLANEOUS) ×2 IMPLANT
KIT BASIN OR (CUSTOM PROCEDURE TRAY) ×2 IMPLANT
KIT SUCTION CATH 14FR (SUCTIONS) IMPLANT
KIT TURNOVER KIT B (KITS) ×2 IMPLANT
NDL HYPO 25GX1X1/2 BEV (NEEDLE) ×2 IMPLANT
NDL SPNL 22GX3.5 QUINCKE BK (NEEDLE) ×2 IMPLANT
NEEDLE HYPO 25GX1X1/2 BEV (NEEDLE) ×1 IMPLANT
NEEDLE SPNL 22GX3.5 QUINCKE BK (NEEDLE) ×1 IMPLANT
NS IRRIG 1000ML POUR BTL (IV SOLUTION) ×4 IMPLANT
PACK CHEST (CUSTOM PROCEDURE TRAY) ×2 IMPLANT
PAD ARMBOARD POSITIONER FOAM (MISCELLANEOUS) ×4 IMPLANT
PORT ACCESS TROCAR AIRSEAL 12 (TROCAR) ×2 IMPLANT
RELOAD STAPLE 45 2.5 WHT DVNC (STAPLE) IMPLANT
RELOAD STAPLE 45 3.5 BLU DVNC (STAPLE) IMPLANT
RELOAD STAPLE 45 4.3 GRN DVNC (STAPLE) IMPLANT
RELOAD STAPLER 2.5X45 WHT DVNC (STAPLE) ×3 IMPLANT
RELOAD STAPLER 3.5X45 BLU DVNC (STAPLE) ×5 IMPLANT
RELOAD STAPLER 4.3X45 GRN DVNC (STAPLE) ×4 IMPLANT
SCISSORS LAP 5X35 DISP (ENDOMECHANICALS) IMPLANT
SEAL UNIV 5-12 XI (MISCELLANEOUS) ×8 IMPLANT
SEALANT PROGEL (MISCELLANEOUS) IMPLANT
SET TRI-LUMEN FLTR TB AIRSEAL (TUBING) ×2 IMPLANT
SOLUTION ELECTROSURG ANTI STCK (MISCELLANEOUS) ×2 IMPLANT
SPONGE INTESTINAL PEANUT (DISPOSABLE) IMPLANT
SPONGE TONSIL 1 RF SGL (DISPOSABLE) IMPLANT
STAPLER 45 SUREFORM CVD DVNC (STAPLE) IMPLANT
SUT PROLENE 4-0 RB1 .5 CRCL 36 (SUTURE) IMPLANT
SUT SILK 1 MH (SUTURE) ×2 IMPLANT
SUT SILK 2 0 SH (SUTURE) ×2 IMPLANT
SUT SILK 2 0SH CR/8 30 (SUTURE) IMPLANT
SUT SILK 3 0SH CR/8 30 (SUTURE) IMPLANT
SUT VIC AB 1 CTX36XBRD ANBCTR (SUTURE) ×2 IMPLANT
SUT VIC AB 2-0 CTX 36 (SUTURE) ×2 IMPLANT
SUT VIC AB 3-0 X1 27 (SUTURE) ×4 IMPLANT
SUT VICRYL 0 TIES 12 18 (SUTURE) ×2 IMPLANT
SUT VICRYL 0 UR6 27IN ABS (SUTURE) ×4 IMPLANT
SYR 20CC LL (SYRINGE) ×4 IMPLANT
SYSTEM RETRIEVAL ANCHOR 15 (MISCELLANEOUS) IMPLANT
SYSTEM SAHARA CHEST DRAIN ATS (WOUND CARE) ×2 IMPLANT
TAPE CLOTH 4X10 WHT NS (GAUZE/BANDAGES/DRESSINGS) ×2 IMPLANT
TAPE CLOTH SURG 4X10 WHT LF (GAUZE/BANDAGES/DRESSINGS) IMPLANT
TIP SPRAY PROGEL 11IN (MISCELLANEOUS) IMPLANT
TOWEL GREEN STERILE (TOWEL DISPOSABLE) ×2 IMPLANT
TRAY FOLEY MTR SLVR 16FR STAT (SET/KITS/TRAYS/PACK) ×2 IMPLANT
WATER STERILE IRR 1000ML POUR (IV SOLUTION) ×4 IMPLANT

## 2024-02-19 NOTE — TOC CM/SW Note (Signed)
 Transition of Care [facility]) - Inpatient Brief Assessment   Patient Details  Name: KENDRIC SINDELAR MRN: [id number] Date of Birth: [birth year]  Transition of Care [facility]) CM/SW Contact:    Lauraine FORBES Saa, LCSW Phone Number: 02/19/2024, 3:24 PM   Clinical Narrative:  3:24 PM Per chart review, patient resides at home with child(ren). Patient has a PCP and insurance. Patient does not have SNF or HH history. Patient has DME (rolling walker, 3 in 1) history. Patient's preferred pharmacy's are [facility] and [facility] [address]. No TOC needs were identified at this time. TOC will continue to follow and be available to assist.  Transition of Care Asessment: Insurance and Status: Insurance coverage has been reviewed Patient has primary care physician: Yes Home environment has been reviewed: Private Residence Prior level of function:: N/A Prior/Current Home Services: No current home services Social Drivers of Health Review: SDOH reviewed no interventions necessary Readmission risk has been reviewed: Yes Transition of care needs: no transition of care needs at this time

## 2024-02-19 NOTE — Anesthesia Procedure Notes (Signed)
 Procedure Name: Intubation Date/Time: 02/19/2024 7:42 AM  Performed by: Zelphia Norleen [name], CRNAPre-anesthesia Checklist: Patient identified, Emergency Drugs available, Suction available and Patient being monitored Patient Re-evaluated:Patient Re-evaluated prior to induction Oxygen Delivery Method: Circle system utilized Preoxygenation: Pre-oxygenation with 100% oxygen Induction Type: IV induction Ventilation: Mask ventilation without difficulty Laryngoscope Size: Mac and 4 Grade View: Grade I Tube type: Oral Endobronchial tube: Left and 41 Fr Number of attempts: 1 Airway Equipment and Method: Stylet, Oral airway and Fiberoptic brochoscope Placement Confirmation: ETT inserted through vocal cords under direct vision, positive ETCO2 and breath sounds checked- equal and bilateral Tube secured with: Tape Dental Injury: Teeth and Oropharynx as per pre-operative assessment

## 2024-02-19 NOTE — Brief Op Note (Signed)
 02/19/2024  11:25 AM  PATIENT:  Brett [name]  68 y.o. male  PRE-OPERATIVE DIAGNOSIS:  LEFT LOWER LOBE ADENOCARCINOMA  POST-OPERATIVE DIAGNOSIS:  LEFT LOWER LOBE ADENOCARCINOMA  PROCEDURE:   LOBECTOMY, LUNG, ROBOT-ASSISTED, USING VATS (Left) - ROBOTIC LEFT LOWER LOBECTOMY  BLOCK, NERVE, INTERCOSTAL (Left)  BIOPSY, LYMPH NODE (Left)  SURGEON: Kerrin Elspeth BROCKS, MD   PHYSICIAN ASSISTANT: Rhealyn Cullen  ASSISTANTS: Pesare, Piyanuch, RN, Scrub Person         Williams, Jaime L, CST, Scrub Perso   ANESTHESIA:   general  EBL:  200 mL   BLOOD ADMINISTERED:none  DRAINS: 76fr left chest tube   LOCAL MEDICATIONS USED:  Exparel   SPECIMEN:  left lower lung lobe and multiple lymph nodes  DISPOSITION OF SPECIMEN:  PATHOLOGY  COUNTS:  Correct  DICTATION: .Dragon Dictation  PLAN OF CARE: Admit to inpatient   PATIENT DISPOSITION:  PACU - hemodynamically stable.   Delay start of Pharmacological VTE agent (>24hrs) due to surgical blood loss or risk of bleeding: no

## 2024-02-19 NOTE — Progress Notes (Signed)
 Patient has large air leak, audible, dressing changed.  No leak auscultated at CT insertion site.  CT to 20 cm sx, 250 cc output, sq air noted to chest, bilat arms, neck.  Voice continues with high pitch sound.  Pt denies distress, SOB.  Oxygen sats 90's on 2L.  PA Raguel called to make aware of CT drainage and large leak.

## 2024-02-19 NOTE — Anesthesia Procedure Notes (Signed)
 Arterial Line Insertion Start/End7/14/2025 7:30 AM, 02/19/2024 7:35 AM Performed by: Maryclare Cornet, MD, Zelphia Norleen [name], CRNA, CRNA  Patient location: OOR procedure area. Preanesthetic checklist: patient identified, IV checked, site marked, risks and benefits discussed, surgical consent, monitors and equipment checked, pre-op evaluation, timeout performed and anesthesia consent Lidocaine  1% used for infiltration Left, radial was placed Catheter size: 20 G Hand hygiene performed  and maximum sterile barriers used   Attempts: 1 Procedure performed without using ultrasound guided technique. Following insertion, dressing applied. Post procedure assessment: normal and unchanged

## 2024-02-19 NOTE — Hospital Course (Addendum)
 PCP is [name], NP Referring Provider is Isadora Hose, MD    HPI: Brett [name] is sent for consultation regarding an adenocarcinoma of the left lower lobe.   Brett [name] is a 68 year old former smoker with a past medical history significant for long-term tobacco use, COPD, paroxysmal SVT, nonsustained ventricular tachycardia, congenital hip deformity, scoliosis, multiple hip and back surgeries, cervical disc disease, chronic pain, glaucoma, rosacea, and recently diagnosed lung cancer.   He had back surgery last fall.  During the procedure he was noted to have short episodes of paroxysmal SVT treated with esmolol .  He was referred to Dr. Liborio as an outpatient.  He had a workup including Zio patch and echocardiogram.  Echo showed ejection fraction of 50 to 55% with diastolic dysfunction.  That led to a stress PET.  The PET showed severe coronary calcification but no evidence of ischemia or malperfusion.  There was a new 5 mm left lower lobe lung nodule that had not been on the patient's previous screening scans.  He had another screening scan in April which showed the nodule had increased in size to 11 mm.  The nodule measured 16 x 9 mm and was hypermetabolic with an SUV of 6.2.  There was uptake in the hilar and mediastinal regions consistent with nodes but there were no enlarged lymph nodes in those areas.  Also some uptake along the left parotid gland.  There was an area of uptake along the right fifth rib and an area of sclerosis.  Also slight bit of uptake in the eighth rib.   Also was noted to have some gastric thickening on one of the CT is scheduled to have an upper endoscopy on 02/14/2024.   He denies any chest pain, pressure, or tightness.  He does have some shortness of breath.  He does have occasional wheezing which is better when he takes Claritin .  Also has a productive cough.  Complains of reflux and frequent heartburn.  Ambulates with a cane.  Has some weakness in the  right leg.  No change in appetite or significant weight loss.  Quit smoking about 3 months ago with the help of Chantix .  Adenocarcinoma left lower lobe-clinical stage Ia (T1, N0) versus 2B, T1, N1).  Given the normal size of the lymph nodes I think we should get the benefit of the doubt on that.  Surgery would be part of the treatment regimen in either case.  If the lymph nodes are positive would also need adjuvant chemotherapy.   Discussed treatment options including surgical resection versus radiation.  He understands the relative advantages and disadvantages of each approach.   I described the proposed operative procedure to Brett [name] and his daughter.  They understand the general nature of the procedure including the need for general anesthesia, the incisions to be used, the use of surgical robot, use of drains to postoperatively, the expected hospital stay, and the overall recovery.  I informed them of the indications, risks, benefits, and alternatives.  They understand the risks include, but not limited to death, MI, DVT, PE, bleeding, possibly for transfusion, infection, prolonged air leak, cardiac arrhythmias, pain issues, as well as possibility of other unforeseeable complications.   He accepts the risk and agrees to proceed.   Hospital Course: Brett [name] was admitted for elective surgery on 02/19/2024.  He was taken to the operating room where robotic assisted right lower lobectomy was carried out along with lymph node dissection and intercostal nerve block.  Following procedure,  he was recovered in the postanesthesia care unit and later transferred to [facility].  Postoperative hospital course:  The patient has remained hemodynamically stable.  Chest tube output on postop day #1 was moderate.  It has continued to be fairly significant over the next several days but is steadily slowing over time.  He was noted to have a large air leak and chest tube is to suction with the  exception of ambulation.  This airleak was followed for several days and ultimately was determined the patient would benefit from a bronchial valves which was performed on postop day 5.  Post operatively he required BIPAP and required oxygen via HFNC.  He was transported to the ICU.  Also post operatively his chest tube was free from air leak, improved aeration but did develop re-expansion edema.  Clinically his situation regarding airleak and pulmonary status showed significant improvement over time.  His chest tube was removed on postop day #8.  Renal function has remained stable and he has good urine output.  he did have a mild, likely reactive leukocytosis with white blood cell count 13.3.  It has normalized over time.  He has a very minor expected acute blood loss anemia which is being monitored clinically.  He is kept on Lovenox  for DVT prophylaxis.  Oxygen is being weaned per routine protocols.  He did develop postoperative atrial fibrillation initially treated with intravenous amiodarone  drip and subsequent transition to oral dosing.  He did have some recurrence of episodes and it was felt he should undergo anticoagulation therapy and Eliquis  was initiated.  He required several additional boluses of IV Amiodarone  to help convert to NSR.  He has developed a phlebitis/cellulitis at a previous IV site on his left forearm requiring initiation of intravenous Ancef  with subsequent transition to Augmentin .  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 - Separate focus of adenocarcinoma in situ, 1.9 cm. - Margins negative for carcinoma. - Eleven lymph nodes, all negative for carcinoma (0/11).   He has been weaned down to 2L of oxygen via [place] with good oxygenation. Left forearm (2 areas of phlebitis/cellulitis) proximal wound with swelling, no drainage, redness and more distal wound with mild redness. He will be continued on Augmentin  for a total of  5 days. He is stable for discharge today.

## 2024-02-19 NOTE — Plan of Care (Signed)

## 2024-02-19 NOTE — Anesthesia Preprocedure Evaluation (Signed)
 Anesthesia Evaluation  Patient identified by MRN, date of birth, ID band Patient awake    Reviewed: Allergy & Precautions, H&P , NPO status , Patient's Chart, lab work & pertinent test results  Airway Mallampati: II   Neck ROM: full    Dental   Pulmonary asthma , former smoker   breath sounds clear to auscultation       Cardiovascular + dysrhythmias  Rhythm:regular Rate:Normal     Neuro/Psych    GI/Hepatic   Endo/Other    Renal/GU      Musculoskeletal  (+) Arthritis ,    Abdominal   Peds  Hematology   Anesthesia Other Findings   Reproductive/Obstetrics                              Anesthesia Physical Anesthesia Plan  ASA: 3  Anesthesia Plan: General   Post-op Pain Management:    Induction: Intravenous  PONV Risk Score and Plan: 2 and Ondansetron , Dexamethasone  and Treatment may vary due to age or medical condition  Airway Management Planned: Double Lumen EBT  Additional Equipment: Arterial line  Intra-op Plan:   Post-operative Plan: Extubation in OR  Informed Consent: I have reviewed the patients History and Physical, chart, labs and discussed the procedure including the risks, benefits and alternatives for the proposed anesthesia with the patient or authorized representative who has indicated his/her understanding and acceptance.     Dental advisory given  Plan Discussed with: CRNA, Anesthesiologist and Surgeon  Anesthesia Plan Comments:         Anesthesia Quick Evaluation

## 2024-02-19 NOTE — Progress Notes (Signed)
 Daughter came to nurses station stating her father was sounding nasally and his neck and chest was beginning to swell.   Crackles heard on bilateral lungs. Able to feel subcutaneous air on right upper chest/neck  Notified Erin Barrett PA and VO given to hook chest tube system to suction instead of water seal.   See orders placed. Will continue to monitor pt.

## 2024-02-19 NOTE — Interval H&P Note (Signed)
 History and Physical Interval Note:  02/19/2024 7:10 AM  Brett [name]  has presented today for surgery, with the diagnosis of LLL ADENOCARCINOMA.  The various methods of treatment have been discussed with the patient and family. After consideration of risks, benefits and other options for treatment, the patient has consented to  Procedure(s) with comments: LOBECTOMY, LUNG, ROBOT-ASSISTED, USING VATS (Left) - ROBOTIC LEFT LOWER LOBECTOMY as a surgical intervention.  The patient's history has been reviewed, patient examined, no change in status, stable for surgery.  I have reviewed the patient's chart and labs.  Questions were answered to the patient's satisfaction.     Elspeth JAYSON Millers

## 2024-02-19 NOTE — Transfer of Care (Signed)
 Immediate Anesthesia Transfer of Care Note  Patient: Brett [name]  Procedure(s) Performed: LOBECTOMY, LUNG, ROBOT-ASSISTED, USING VATS (Left: Chest) BLOCK, NERVE, INTERCOSTAL (Left: Chest) BIOPSY, LYMPH NODE (Left: Chest)  Patient Location: PACU  Anesthesia Type:General  Level of Consciousness: awake  Airway & Oxygen Therapy: Patient Spontanous Breathing and Patient connected to face mask oxygen  Post-op Assessment: Report given to RN and Post -op Vital signs reviewed and stable  Post vital signs: Reviewed and stable  Last Vitals:  Vitals Value Taken Time  BP 149/71   temp 97.7   Pulse 75   Resp 16   SpO2 98     Last Pain:  Vitals:   02/19/24 0615  TempSrc:   PainSc: 0-No pain         Complications: No notable events documented.

## 2024-02-20 ENCOUNTER — Inpatient Hospital Stay (HOSPITAL_COMMUNITY)

## 2024-02-20 ENCOUNTER — Encounter (HOSPITAL_COMMUNITY): Payer: Self-pay | Admitting: Thoracic Surgery (Cardiothoracic Vascular Surgery)

## 2024-02-20 DIAGNOSIS — Z902 Acquired absence of lung [part of]: Secondary | ICD-10-CM

## 2024-02-20 DIAGNOSIS — J95812 Postprocedural air leak: Secondary | ICD-10-CM

## 2024-02-20 LAB — CBC
HCT: 36.4 % — ABNORMAL LOW (ref 39.0–52.0)
Hemoglobin: 12.4 g/dL — ABNORMAL LOW (ref 13.0–17.0)
MCH: 34.3 pg — ABNORMAL HIGH (ref 26.0–34.0)
MCHC: 34.1 g/dL (ref 30.0–36.0)
MCV: 100.8 fL — ABNORMAL HIGH (ref 80.0–100.0)
Platelets: 172 K/uL (ref 150–400)
RBC: 3.61 MIL/uL — ABNORMAL LOW (ref 4.22–5.81)
RDW: 12.4 % (ref 11.5–15.5)
WBC: 13.3 K/uL — ABNORMAL HIGH (ref 4.0–10.5)
nRBC: 0 % (ref 0.0–0.2)

## 2024-02-20 LAB — BASIC METABOLIC PANEL WITH GFR
Anion gap: 10 (ref 5–15)
BUN: 18 mg/dL (ref 8–23)
CO2: 23 mmol/L (ref 22–32)
Calcium: 8.3 mg/dL — ABNORMAL LOW (ref 8.9–10.3)
Chloride: 102 mmol/L (ref 98–111)
Creatinine, Ser: 0.9 mg/dL (ref 0.61–1.24)
GFR, Estimated: >60 mL/min (ref >60)
Glucose, Bld: 198 mg/dL — ABNORMAL HIGH (ref 70–99)
Potassium: 4.2 mmol/L (ref 3.5–5.1)
Sodium: 135 mmol/L (ref 135–145)

## 2024-02-20 LAB — MAGNESIUM: Magnesium: 2 mg/dL (ref 1.7–2.4)

## 2024-02-20 MED ORDER — METHOCARBAMOL 500 MG PO TABS
500.0000 mg | ORAL_TABLET | Freq: Three times a day (TID) | ORAL | Status: DC | PRN
  Administered 2024-02-20 – 2024-03-01 (×8): 500 mg via ORAL
  Filled 2024-02-20 (×10): qty 1

## 2024-02-20 MED ORDER — AMIODARONE HCL 200 MG PO TABS: 400.0000 mg | ORAL_TABLET | Freq: Every day | ORAL | Status: DC

## 2024-02-20 MED ORDER — AMIODARONE HCL 200 MG PO TABS
400.0000 mg | ORAL_TABLET | Freq: Two times a day (BID) | ORAL | Status: DC
  Administered 2024-02-20: 400 mg via ORAL
  Filled 2024-02-20: qty 2

## 2024-02-20 MED ORDER — OXYCODONE HCL 5 MG PO TABS
5.0000 mg | ORAL_TABLET | Freq: Once | ORAL | Status: AC
  Administered 2024-02-20: 5 mg via ORAL
  Filled 2024-02-20: qty 1

## 2024-02-20 MED ORDER — AMIODARONE IV BOLUS ONLY 150 MG/100ML
150.0000 mg | Freq: Once | INTRAVENOUS | Status: AC
  Administered 2024-02-20: 150 mg via INTRAVENOUS
  Filled 2024-02-20: qty 100

## 2024-02-20 MED ORDER — AMIODARONE HCL IN DEXTROSE 360-4.14 MG/200ML-% IV SOLN
30.0000 mg/h | INTRAVENOUS | Status: DC
  Administered 2024-02-21 (×3): 30 mg/h via INTRAVENOUS
  Filled 2024-02-20 (×4): qty 200

## 2024-02-20 MED ORDER — OXYCODONE HCL 5 MG PO TABS
15.0000 mg | ORAL_TABLET | Freq: Four times a day (QID) | ORAL | Status: DC | PRN
  Administered 2024-02-20 – 2024-03-03 (×40): 15 mg via ORAL
  Filled 2024-02-20 (×40): qty 3

## 2024-02-20 MED ORDER — AMIODARONE HCL IN DEXTROSE 360-4.14 MG/200ML-% IV SOLN
60.0000 mg/h | INTRAVENOUS | Status: AC
  Administered 2024-02-20 – 2024-02-21 (×2): 60 mg/h via INTRAVENOUS
  Filled 2024-02-20: qty 200

## 2024-02-20 NOTE — Progress Notes (Signed)
 CCMD called to inform this RN that the pt had a short run of SVT and was currently in afib. Pt assessed, 12 lead EKG obtained and Brett [name], [place] made aware.   02/20/24 1207  Provider Notification  Provider Name/Title Brett Cera, PA  Date Provider Notified 02/20/24  Time Provider Notified 1207  Method of Notification Page  Notification Reason New onset of dysrhythmia  Type of New Onset of Dysrhythmia Atrial fibrillation  Symptoms of New Onset of Dysrhythmia Asymptomatic  Provider response See new orders  Date of Provider Response 02/20/24  Time of Provider Response 1208

## 2024-02-20 NOTE — Op Note (Signed)
 NAME: Brallier, Kealii S. MEDICAL RECORD NO: [id number] ACCOUNT NO: [id number] DATE OF BIRTH: 1956-06-09 FACILITY: [facility] LOCATION: [facility] PHYSICIAN: Elspeth BROCKS. Kerrin, MD  Operative Report   DATE OF PROCEDURE: 02/19/2024  PREOPERATIVE DIAGNOSIS: Adenocarcinoma, left lower lobe, clinical stage IA to IIB (T1, N0 versus T1, N1).  POSTOPERATIVE DIAGNOSIS: Adenocarcinoma, left lower lobe, clinical stage IA to IIB (T1, N0 versus T1, N1).  PROCEDURE: Robotic-assisted left lower lobectomy, lymph node dissection, and intercostal nerve blocks levels 3 through 10.  SURGEON: Elspeth BROCKS. Kerrin, MD.  ASSISTANT: Laurel Becket, PA.  Experienced assistance is necessary for this case due to surgical complexity. Laurel Becket assisted with port placement, robot docking, and un-docking, instrument exchange, specimen retrieval, suctioning, and wound closure.  ANESTHESIA: General.  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 Bronchial margin negative for tumor. Level 7 and 10 nodes negative for tumor.  CLINICAL NOTE: Mr. Lakatos is a 68 year old man who is a former smoker with a history of COPD, recently diagnosed with lung cancer. He had a biopsy which showed adenocarcinoma. There were hypermetabolic hilar nodes but were not pathologically enlarged.  The patient was offered surgical resection. The indications, risks, benefits, and alternatives were discussed in detail with the patient. He understood and accepted the risks and agreed to proceed.  OPERATIVE NOTE: Mr. Grange was brought to the operating room on 02/19/2024. He had induction of general anesthesia and was intubated with a double-lumen endotracheal tube. Intravenous antibiotics were administered. A Foley catheter was placed. Sequential compression devices were placed on the calves for DVT prophylaxis. He was placed in a right lateral decubitus position.  A Bair hugger was placed for active warming, and the  left chest was prepped and draped in the usual sterile fashion.  A time-out was performed. A solution containing 20 mL of liposomal bupivacaine , 30 mL of 0.5% bupivacaine , and 50 mL of saline was prepared. This solution was used for local at the incision sites as well as for the intercostal nerve blocks. An incision was made in the eighth interspace in the midaxillary line and an 8-mm robotic port was inserted. The thoracoscope was advanced into the chest. After confirming intrapleural placement, carbon dioxide was insufflated per protocol. There was good isolation of the left lung, but it was relatively slow to deflate. A 12-mm robotic port was placed in the eighth interspace, anterior to the camera port. Intercostal nerve blocks then were performed from the third to the tenth interspace by injecting 10 mL of  bupivacaine  solution into a subpleural plane at each level. A 12-mm AirSeal port was placed in the tenth interspace posterolaterally. Two additional 8th interspace robotic ports were placed. The robot was deployed. The camera arm was docked. Targeting was performed. The remaining arms were docked. The robotic instruments were inserted with thoracoscopic visualization. The lung was retracted superiorly. The inferior pulmonary ligament was divided. All dissection was done with bipolar cautery. Level 9  nodes were removed. The lower lobe then was retracted anteriorly. The pleural reflection was divided at the hilum posteriorly. A level 7 node was removed and sent for frozen section. It had a mildly granular appearance. Frozen section ultimately showed no evidence of malignancy. A level 10 node was removed. The dissection was begun on the pulmonary artery posteriorly in that area. The dissection then was carried superiorly into the aortopulmonary window. The pleural reflection was divided at the hilum superiorly. Level 10 and two level 5 nodes were removed. The lung then was retracted  posteriorly and the  pleural reflection was divided at the hilum anteriorly. The phrenic nerve was identified. No cautery was used in this vicinity. Dissection was begun in the fissure. The fissure was incomplete. The pulmonary artery was identified in its midpoint and then the fissure was completed anteriorly and posteriorly with sequential firings of the robotic stapler. A level 11 node was removed and sent for frozen section. It also returned showing no malignancy. Additional level 10, 11, and 12 nodes were removed during this portion of the dissection. Once the fissure had been completed, the lung was retracted superiorly. The inferior pulmonary vein was encircled  and divided with a robotic stapler. There were two superior segmental PA branches in close proximity. These were encircled and divided with a robotic stapler and then the basilar segmental pulmonary artery trunk was divided. Finally, the stapler was placed across the left lower lobe bronchus at its origin and closed.  The test inflation showed aeration of the upper lobe. The stapler was fired, transecting the lower lobe bronchus. The vessel loop and  sponges used during the dissection were removed. The chest was copiously irrigated with saline. A test inflation of 30 cm of water revealed air leakage from the fissure, but no air leak from the bronchial stump. ProGel was applied to the areas of air leak. The robotic instruments were removed. The robot was undocked. The anterior eighth interspace incision was lengthened to 3 cm. A 15-mm endoscopic retrieval bag was placed in the chest. The lower lobe was manipulated into the bag, removed, and sent for frozen section. It subsequently returned with no tumor seen at the bronchial margin. A 28-French Blake drain was placed through the original port incision directed to the apex. It was secured with #1 silk suture. The incisions were closed in standard fashion. The chest tube was placed to a Pleur-evac on waterseal. The patient  was placed back in the supine position. He was extubated in the operating room and taken to the post-anesthesia care unit in good condition. All sponge, needle, and instrument counts were correct at the end of the procedure.    SUJ D: 02/19/2024 6:12:46 pm T: 02/20/2024 2:35:00 am  JOB: [id number]/ [id number]

## 2024-02-20 NOTE — Progress Notes (Addendum)
 1 Day Post-Op Procedure(s) (LRB): LOBECTOMY, LUNG, ROBOT-ASSISTED, USING VATS (Left) BLOCK, NERVE, INTERCOSTAL (Left) BIOPSY, LYMPH NODE (Left) Subjective: Mod pain  Objective: Vital signs in last 24 hours: Temp:  [97.7 F (36.5 C)-99 F (37.2 C)] 97.7 F (36.5 C) (07/15 0350) Pulse Rate:  [66-84] 72 (07/15 0350) Cardiac Rhythm: Junctional rhythm (07/14 2000) Resp:  [9-22] 18 (07/15 0350) BP: (105-137)/(61-94) 126/79 (07/15 0350) SpO2:  [90 %-99 %] 92 % (07/15 0350)  Hemodynamic parameters for last 24 hours:    Intake/Output from previous day: 07/14 0701 - 07/15 0700 In: 2441 [P.O.:480; I.V.:1861; IV Piggyback:100] Out: 1020 [Urine:300; Blood:200; Chest Tube:520] Intake/Output this shift: No intake/output data recorded.  General appearance: alert, cooperative, and no distress Heart: regular rate and rhythm Lungs: crackles from SQ  air, good air movement throughout Abdomen: benign Extremities: PAS in place Wound: incis healing well  Lab Results: No results for input(s): WBC, HGB, HCT, PLT in the last 72 hours. BMET: No results for input(s): NA, K, CL, CO2, GLUCOSE, BUN, CREATININE, CALCIUM  in the last 72 hours.  PT/INR: No results for input(s): LABPROT, INR in the last 72 hours. ABG No results found for: PHART, HCO3, TCO2, ACIDBASEDEF, O2SAT CBG (last 3)  No results for input(s): GLUCAP in the last 72 hours.  Meds Scheduled Meds:  acetaminophen   1,000 mg Oral Q6H   Or   acetaminophen  (TYLENOL ) oral liquid 160 mg/5 mL  1,000 mg Oral Q6H   bisacodyl   10 mg Oral Daily   Chlorhexidine  Gluconate Cloth  6 each Topical Daily   enoxaparin  (LOVENOX ) injection  40 mg Subcutaneous Daily   gabapentin   300 mg Oral QHS   Followed by   NOREEN ON 02/22/2024] gabapentin   300 mg Oral BID   ketorolac   15 mg Intravenous Q6H   latanoprost   1 drop Both Eyes q morning   metoprolol  succinate  25 mg Oral Daily   pantoprazole   40 mg Oral Daily    senna-docusate  1 tablet Oral QHS   timolol   1 drop Both Eyes BID   Continuous Infusions:  sodium chloride  75 mL/hr at 02/19/24 2315   PRN Meds:.ondansetron  (ZOFRAN ) IV, oxyCODONE , traMADol   Xrays DG Chest Port 1 View Result Date: 02/19/2024 CLINICAL DATA:  Status post left-sided lobectomy. EXAM: PORTABLE CHEST 1 VIEW COMPARISON:  Preop x-ray 02/15/2024 FINDINGS: Hyperinflation. Left-sided chest tube in place with a moderate left-sided apical pneumothorax. Some basilar components. Left-sided chest wall gas. Normal cardiopericardial silhouette calcified aorta. There is some linear opacity at the right lung base likely atelectasis. No edema. Overlapping cardiac leads. No right-sided pneumothorax. IMPRESSION: Surgical changes of left lobectomy with moderate left pneumothorax by chest tube in place. No midline shift. Recommend follow-up Electronically Signed   By: Ranell Bring M.D.   On: 02/19/2024 14:11    Assessment/Plan: S/P Procedure(s) (LRB): LOBECTOMY, LUNG, ROBOT-ASSISTED, USING VATS (Left) BLOCK, NERVE, INTERCOSTAL (Left) BIOPSY, LYMPH NODE (Left)  POD#1  1 afeb, s BP 100's-130's, sinus rhythm 2 O2 sats good on 2 liters 3 good UOP- not all recorded 4 CT 520 ml post op, + air leak, leave to suction except to walk 5 labs pending 6 CXR - + SQ air on left, lung appears expanded 7 mod pain- will add robaxin  8 lovenox  for DVT ppx 9 routine pulm hygiene and rehab    LOS: 1 day    Lemond FORBES Cera PA-C Pager [phone] 02/20/2024 Patient seen and examined, agree with above Has a relatively large air leak- required suction  yesterday evening for SQ emphysema Keep tube to suction when in bed/ chair-  water seal for ambulation  Elspeth C. Kerrin, MD [facility] [phone]

## 2024-02-20 NOTE — Plan of Care (Signed)

## 2024-02-21 ENCOUNTER — Inpatient Hospital Stay (HOSPITAL_COMMUNITY)

## 2024-02-21 LAB — COMPREHENSIVE METABOLIC PANEL WITH GFR
ALT: 23 U/L (ref 0–44)
AST: 34 U/L (ref 15–41)
Albumin: 2.7 g/dL — ABNORMAL LOW (ref 3.5–5.0)
Alkaline Phosphatase: 41 U/L (ref 38–126)
Anion gap: 9 (ref 5–15)
BUN: 20 mg/dL (ref 8–23)
CO2: 22 mmol/L (ref 22–32)
Calcium: 7.9 mg/dL — ABNORMAL LOW (ref 8.9–10.3)
Chloride: 102 mmol/L (ref 98–111)
Creatinine, Ser: 1.01 mg/dL (ref 0.61–1.24)
GFR, Estimated: >60 mL/min (ref >60)
Glucose, Bld: 125 mg/dL — ABNORMAL HIGH (ref 70–99)
Potassium: 4.3 mmol/L (ref 3.5–5.1)
Sodium: 133 mmol/L — ABNORMAL LOW (ref 135–145)
Total Bilirubin: 0.4 mg/dL (ref 0.0–1.2)
Total Protein: 5.4 g/dL — ABNORMAL LOW (ref 6.5–8.1)

## 2024-02-21 LAB — CBC
HCT: 36.2 % — ABNORMAL LOW (ref 39.0–52.0)
Hemoglobin: 12 g/dL — ABNORMAL LOW (ref 13.0–17.0)
MCH: 33.4 pg (ref 26.0–34.0)
MCHC: 33.1 g/dL (ref 30.0–36.0)
MCV: 100.8 fL — ABNORMAL HIGH (ref 80.0–100.0)
Platelets: 168 K/uL (ref 150–400)
RBC: 3.59 MIL/uL — ABNORMAL LOW (ref 4.22–5.81)
RDW: 12.4 % (ref 11.5–15.5)
WBC: 11 K/uL — ABNORMAL HIGH (ref 4.0–10.5)
nRBC: 0 % (ref 0.0–0.2)

## 2024-02-21 MED ORDER — BOOST / RESOURCE BREEZE PO LIQD CUSTOM
1.0000 | Freq: Three times a day (TID) | ORAL | Status: DC
  Administered 2024-02-21 – 2024-03-01 (×8): 1 via ORAL
  Filled 2024-02-21: qty 1

## 2024-02-21 MED ORDER — IPRATROPIUM-ALBUTEROL 0.5-2.5 (3) MG/3ML IN SOLN: 3.0000 mL | RESPIRATORY_TRACT | Status: DC | PRN

## 2024-02-21 MED ORDER — IPRATROPIUM-ALBUTEROL 0.5-2.5 (3) MG/3ML IN SOLN
3.0000 mL | Freq: Four times a day (QID) | RESPIRATORY_TRACT | Status: DC
  Administered 2024-02-21: 3 mL via RESPIRATORY_TRACT
  Filled 2024-02-21: qty 3

## 2024-02-21 NOTE — Plan of Care (Signed)
  Problem: Education: Goal: Knowledge of General Education information will improve Description: Including pain rating scale, medication(s)/side effects and non-pharmacologic comfort measures Outcome: Progressing   Problem: Clinical Measurements: Goal: Ability to maintain clinical measurements within normal limits will improve Outcome: Progressing Goal: Respiratory complications will improve Outcome: Progressing   Problem: Nutrition: Goal: Adequate nutrition will be maintained Outcome: Progressing   Problem: Education: Goal: Knowledge of disease or condition will improve Outcome: Progressing   Problem: Cardiac: Goal: Will achieve and/or maintain hemodynamic stability Outcome: Progressing

## 2024-02-21 NOTE — Progress Notes (Addendum)
 2 Days Post-Op      [address]       [name] [address]             [phone]   Subjective: Had atrial fibrillation yesterday, currently in sinus on amio gtt  Objective: Vital signs in last 24 hours: Temp:  [97.6 F (36.4 C)-99.1 F (37.3 C)] 98.1 F (36.7 C) (07/16 0535) Pulse Rate:  [59-122] 60 (07/16 0535) Cardiac Rhythm: Atrial fibrillation (07/15 1925) Resp:  [13-22] 15 (07/16 0535) BP: (94-131)/(64-94) 94/65 (07/16 0535) SpO2:  [90 %-94 %] 94 % (07/16 0535)  Hemodynamic parameters for last 24 hours:    Intake/Output from previous day: 07/15 0701 - 07/16 0700 In: 333.2 [I.V.:333.2] Out: 1700 [Urine:700; Chest Tube:1000] Intake/Output this shift: No intake/output data recorded.  General appearance: alert, cooperative, and no distress Heart: regular rate and rhythm Lungs: coarse BS Abdomen: benign Extremities: no edema or calf tenderness Wound: incis healing well  Lab Results: Recent Labs    02/20/24 0619 02/21/24 0114  WBC 13.3* 11.0*  HGB 12.4* 12.0*  HCT 36.4* 36.2*  PLT 172 168   BMET:  Recent Labs    02/20/24 0619 02/21/24 0114  NA 135 133*  K 4.2 4.3  CL 102 102  CO2 23 22  GLUCOSE 198* 125*  BUN 18 20  CREATININE 0.90 1.01  CALCIUM  8.3* 7.9*    PT/INR: No results for input(s): LABPROT, INR in the last 72 hours. ABG No results found for: PHART, HCO3, TCO2, ACIDBASEDEF, O2SAT CBG (last 3)  No results for input(s): GLUCAP in the last 72 hours.  Meds Scheduled Meds:  acetaminophen   1,000 mg Oral Q6H   Or   acetaminophen  (TYLENOL ) oral liquid 160 mg/5 mL  1,000 mg Oral Q6H   bisacodyl   10 mg Oral Daily   Chlorhexidine  Gluconate Cloth  6 each Topical Daily   enoxaparin  (LOVENOX ) injection  40 mg Subcutaneous Daily   gabapentin   300 mg Oral QHS   Followed by   NOREEN ON 02/22/2024] gabapentin   300 mg Oral BID   latanoprost   1 drop Both Eyes q morning   metoprolol  succinate  25 mg Oral Daily    pantoprazole   40 mg Oral Daily   senna-docusate  1 tablet Oral QHS   timolol   1 drop Both Eyes BID   Continuous Infusions:  amiodarone  30 mg/hr (02/21/24 0016)   PRN Meds:.methocarbamol , ondansetron  (ZOFRAN ) IV, oxyCODONE , traMADol   Xrays DG Chest Port 1 View Result Date: 02/20/2024 CLINICAL DATA:  Status post left lower lobectomy EXAM: PORTABLE CHEST 1 VIEW COMPARISON:  Chest radiograph dated 02/19/2024 FINDINGS: Lines/tubes: Slight interval retraction of left medial pleural catheter. Interval increased extensive subcutaneous emphysema extending into the left neck, left chest wall, and right neck. Lungs: Well inflated lungs. Bibasilar patchy opacities. Similar right apical pleural calcifications. Pleura: Possible trace left pneumothorax.  No pleural effusion. Heart/mediastinum: The heart size and mediastinal contours are within normal limits. Bones: No acute osseous abnormality. IMPRESSION: 1. Possible trace left pneumothorax. Slight interval retraction of left medial pleural catheter. 2. Interval increased extensive subcutaneous emphysema extending into the left neck, left chest wall, and right neck. 3. Bibasilar patchy opacities, likely atelectasis. Electronically Signed   By: Limin  Xu M.D.   On: 02/20/2024 10:02   DG Chest Port 1 View Result Date: 02/19/2024 CLINICAL DATA:  Status post left-sided lobectomy. EXAM: PORTABLE CHEST 1 VIEW COMPARISON:  Preop x-ray 02/15/2024 FINDINGS: Hyperinflation. Left-sided chest tube in place with a moderate  left-sided apical pneumothorax. Some basilar components. Left-sided chest wall gas. Normal cardiopericardial silhouette calcified aorta. There is some linear opacity at the right lung base likely atelectasis. No edema. Overlapping cardiac leads. No right-sided pneumothorax. IMPRESSION: Surgical changes of left lobectomy with moderate left pneumothorax by chest tube in place. No midline shift. Recommend follow-up Electronically Signed   By: Ranell Bring M.D.    On: 02/19/2024 14:11    Assessment/Plan: S/P Procedure(s) (LRB): LOBECTOMY, LUNG, ROBOT-ASSISTED, USING VATS (Left) BLOCK, NERVE, INTERCOSTAL (Left) BIOPSY, LYMPH NODE (Left)  POD#2  1 afeb, s BP 94-130's, afib-sinus rhythm/brady- currently on amio gtt, holding on restarting entresto  and jardiance  for now 2 O2 sats good on 2 liters- will add nebs 3 CT 1000 ml/24H, serosang, + large air leak, cont suction for now 4 CXR stable in appearance regarding sub q air, no apparent left pntx , some increase right basilar ASD/Atx 5 lytes normal, except minor hyponatremia, Na ++ 133 6 normal renal function 7 protein cal malnutrition, alb, 2.7, T protein 5.4- wil add supplements 8 leukocytosis trending lower, WBC 11 9 minor expected ABLA, stable 10 push pulm hygiene and rehab modalities      LOS: 2 days    Lemond FORBES Cera PA-C Pager [phone] 02/21/2024   Still has an air leak. Will keep on suction.  Less than it was initially but about the same as yesterday Path still pending  Elspeth C. Kerrin, MD [facility] [phone]

## 2024-02-21 NOTE — Progress Notes (Signed)
 Mobility Specialist Progress Note;   02/21/24 1014  Mobility  Activity Ambulated with assistance in hallway  Level of Assistance Contact guard assist, steadying assist  Assistive Device Front wheel walker  Distance Ambulated (ft) 75 ft  Activity Response Tolerated well  Mobility Referral Yes  Mobility visit 1 Mobility  Mobility Specialist Start Time (ACUTE ONLY) 1014  Mobility Specialist Stop Time (ACUTE ONLY) 1034  Mobility Specialist Time Calculation (min) (ACUTE ONLY) 20 min    During-mobility: HR up to 128 bpm Post-mobility: HR 83 bpm  Pt agreeable to mobility. On 2Lo2 upon arrival. Required MinG assistance during ambulation, more assist later as pt fatigued. Ambulated on 3LO2 to maintain SPO2. HR up to 128 bpm w/ activity. Took 2x standing rest breaks d/t fatigue. Pt deferred sitting in chair at [facility]. Pt returned back to bed with all needs met, call bell in reach. Alarm on. Returned to suction.   Lauraine Erm Mobility Specialist Please contact via [employer] or [employer] [phone]

## 2024-02-22 ENCOUNTER — Inpatient Hospital Stay (HOSPITAL_COMMUNITY)

## 2024-02-22 DIAGNOSIS — Z48813 Encounter for surgical aftercare following surgery on the respiratory system: Secondary | ICD-10-CM | POA: Diagnosis not present

## 2024-02-22 DIAGNOSIS — J939 Pneumothorax, unspecified: Secondary | ICD-10-CM | POA: Diagnosis not present

## 2024-02-22 DIAGNOSIS — T797XXA Traumatic subcutaneous emphysema, initial encounter: Secondary | ICD-10-CM | POA: Diagnosis not present

## 2024-02-22 DIAGNOSIS — R918 Other nonspecific abnormal finding of lung field: Secondary | ICD-10-CM | POA: Diagnosis not present

## 2024-02-22 LAB — ACID FAST CULTURE WITH REFLEXED SENSITIVITIES (MYCOBACTERIA)
Acid Fast Culture: NEGATIVE
Acid Fast Culture: NEGATIVE

## 2024-02-22 MED ORDER — AMIODARONE HCL 200 MG PO TABS
400.0000 mg | ORAL_TABLET | Freq: Two times a day (BID) | ORAL | Status: DC
  Administered 2024-02-22 – 2024-02-23 (×3): 400 mg via ORAL
  Filled 2024-02-22 (×3): qty 2

## 2024-02-22 MED ORDER — SORBITOL 70 % SOLN: 30.0000 mL | Freq: Every day | Status: DC | PRN

## 2024-02-22 MED ORDER — LEVALBUTEROL HCL 0.63 MG/3ML IN NEBU
0.6300 mg | INHALATION_SOLUTION | Freq: Three times a day (TID) | RESPIRATORY_TRACT | Status: DC
  Administered 2024-02-22: 0.63 mg via RESPIRATORY_TRACT
  Filled 2024-02-22: qty 3

## 2024-02-22 MED ORDER — LEVALBUTEROL HCL 0.63 MG/3ML IN NEBU: 0.6300 mg | INHALATION_SOLUTION | Freq: Four times a day (QID) | RESPIRATORY_TRACT | Status: DC | PRN

## 2024-02-22 NOTE — Progress Notes (Signed)
 Mobility Specialist Progress Note:    02/22/24 1030  Mobility  Activity Ambulated with assistance in hallway  Level of Assistance Contact guard assist, steadying assist  Assistive Device Front wheel walker  Distance Ambulated (ft) 100 ft  Activity Response Tolerated well  Mobility Referral Yes  Mobility visit 1 Mobility  Mobility Specialist Start Time (ACUTE ONLY) [id number]  Mobility Specialist Stop Time (ACUTE ONLY) 0947  Mobility Specialist Time Calculation (min) (ACUTE ONLY) 21 min   Received pt in bed and agreeable to mobility. Found pt on 3L/ min. MinG required during ambulation for safety d/t unsteadiness. Ambulated on 3L/ min, unreliable SPO2 pleth, however no c/o of SOB. HR up to 133 with activity. Pt took 4 standing breaks d/t fatigue. Returned to room and left in bed. Personal belongings and call light within reach. All needs met.   Lavanda Pollack Mobility Specialist  Please contact via [employer] or  Rehab Office [phone]

## 2024-02-22 NOTE — Plan of Care (Signed)
  Problem: Education: Goal: Knowledge of General Education information will improve Description: Including pain rating scale, medication(s)/side effects and non-pharmacologic comfort measures Outcome: Progressing   Problem: Health Behavior/Discharge Planning: Goal: Ability to manage health-related needs will improve Outcome: Progressing   Problem: Clinical Measurements: Goal: Ability to maintain clinical measurements within normal limits will improve Outcome: Progressing Goal: Diagnostic test results will improve Outcome: Progressing Goal: Cardiovascular complication will be avoided Outcome: Progressing   Problem: Activity: Goal: Risk for activity intolerance will decrease Outcome: Not Progressing   Problem: Nutrition: Goal: Adequate nutrition will be maintained Outcome: Progressing   Problem: Elimination: Goal: Will not experience complications related to urinary retention Outcome: Progressing   Problem: Pain Managment: Goal: General experience of comfort will improve and/or be controlled Outcome: Progressing   Problem: Safety: Goal: Ability to remain free from injury will improve Outcome: Progressing   Problem: Skin Integrity: Goal: Risk for impaired skin integrity will decrease Outcome: Progressing   Problem: Education: Goal: Knowledge of disease or condition will improve Outcome: Progressing Goal: Knowledge of the prescribed therapeutic regimen will improve Outcome: Progressing   Problem: Cardiac: Goal: Will achieve and/or maintain hemodynamic stability Outcome: Progressing   Problem: Clinical Measurements: Goal: Postoperative complications will be avoided or minimized Outcome: Progressing   Problem: Respiratory: Goal: Respiratory status will improve Outcome: Progressing   Problem: Pain Management: Goal: Pain level will decrease Outcome: Not Progressing

## 2024-02-22 NOTE — Anesthesia Postprocedure Evaluation (Signed)
 Anesthesia Post Note  Patient: Brett [name]  Procedure(s) Performed: LOBECTOMY, LUNG, ROBOT-ASSISTED, USING VATS (Left: Chest) BLOCK, NERVE, INTERCOSTAL (Left: Chest) BIOPSY, LYMPH NODE (Left: Chest)     Patient location during evaluation: PACU Anesthesia Type: General Level of consciousness: awake and alert Pain management: pain level controlled Vital Signs Assessment: post-procedure vital signs reviewed and stable Respiratory status: spontaneous breathing, nonlabored ventilation, respiratory function stable and patient connected to nasal cannula oxygen Cardiovascular status: blood pressure returned to baseline and stable Postop Assessment: no apparent nausea or vomiting Anesthetic complications: no   No notable events documented.  Last Vitals:  Vitals:   02/22/24 0357 02/22/24 0804  BP: (!) 98/56 103/89  Pulse: (!) 58   Resp: 15 16  Temp: 36.6 C 36.6 C  SpO2: 93%     Last Pain:  Vitals:   02/22/24 0804  TempSrc: Oral  PainSc:                  Krystopher Kuenzel S

## 2024-02-22 NOTE — Care Management Important Message (Signed)
 Important Message  Patient Details  Name: Brett [name] MRN: [id number] Date of Birth: [birth year]   Important Message Given:  Yes - Medicare IM     Claretta Deed 02/22/2024, 2:45 PM

## 2024-02-22 NOTE — Progress Notes (Addendum)
 3 Days Post-Op Procedure(s) (LRB): LOBECTOMY, LUNG, ROBOT-ASSISTED, USING VATS (Left) BLOCK, NERVE, INTERCOSTAL (Left) BIOPSY, LYMPH NODE (Left) Subjective: Conts w/ air leak, may be a bit smaller,   Objective: Vital signs in last 24 hours: Temp:  [97.7 F (36.5 C)-99.1 F (37.3 C)] 97.8 F (36.6 C) (07/17 0357) Pulse Rate:  [57-112] 58 (07/17 0357) Cardiac Rhythm: Atrial fibrillation (07/16 2000) Resp:  [14-19] 15 (07/17 0357) BP: (95-112)/(55-82) 98/56 (07/17 0357) SpO2:  [89 %-93 %] 93 % (07/17 0357)  Hemodynamic parameters for last 24 hours:    Intake/Output from previous day: 07/16 0701 - 07/17 0700 In: 848.6 [P.O.:360; I.V.:488.6] Out: 2482 [Urine:1500; Chest Tube:982] Intake/Output this shift: No intake/output data recorded.  General appearance: alert, cooperative, and no distress Heart: regular rate and rhythm Lungs: coarse BS Abdomen: benign Extremities: no edema or calf tenderness Wound: incis healing well  Lab Results: Recent Labs    02/20/24 0619 02/21/24 0114  WBC 13.3* 11.0*  HGB 12.4* 12.0*  HCT 36.4* 36.2*  PLT 172 168   BMET:  Recent Labs    02/20/24 0619 02/21/24 0114  NA 135 133*  K 4.2 4.3  CL 102 102  CO2 23 22  GLUCOSE 198* 125*  BUN 18 20  CREATININE 0.90 1.01  CALCIUM  8.3* 7.9*    PT/INR: No results for input(s): LABPROT, INR in the last 72 hours. ABG No results found for: PHART, HCO3, TCO2, ACIDBASEDEF, O2SAT CBG (last 3)  No results for input(s): GLUCAP in the last 72 hours.  Meds Scheduled Meds:  acetaminophen   1,000 mg Oral Q6H   Or   acetaminophen  (TYLENOL ) oral liquid 160 mg/5 mL  1,000 mg Oral Q6H   bisacodyl   10 mg Oral Daily   Chlorhexidine  Gluconate Cloth  6 each Topical Daily   enoxaparin  (LOVENOX ) injection  40 mg Subcutaneous Daily   feeding supplement  1 Container Oral TID BM   gabapentin   300 mg Oral BID   latanoprost   1 drop Both Eyes q morning   metoprolol  succinate  25 mg Oral Daily    pantoprazole   40 mg Oral Daily   senna-docusate  1 tablet Oral QHS   timolol   1 drop Both Eyes BID   Continuous Infusions:  amiodarone  30 mg/hr (02/22/24 0400)   PRN Meds:.ipratropium-albuterol , methocarbamol , ondansetron  (ZOFRAN ) IV, oxyCODONE , traMADol   Xrays DG Chest 1 View Result Date: 02/21/2024 CLINICAL DATA:  68 year old male status post left lower lobectomy for adenocarcinoma postoperative day 2. EXAM: CHEST  1 VIEW COMPARISON:  Portable chest yesterday and earlier. FINDINGS: Portable AP semi upright views at 0559 hours. Stable left chest tube. Moderate volume left chest wall, bilateral lower neck, smaller volume right chest wall subcutaneous emphysema. This appears mildly progressed from yesterday. Comparatively small volume left pneumothorax is visible in the apex. Stable lung volumes. Stable cardiac size and mediastinal contours. Postoperative changes along the left mediastinum. Stable right lung ventilation, streaky and platelike right lower lung atelectasis. Underlying emphysema demonstrated by CT last month. Stable visualized osseous structures. IMPRESSION: 1. Stable left chest tube. Small volume left apical pneumothorax and moderate volume chest and neck subcutaneous emphysema following left lower lobectomy. 2. Underlying emphysema.  Patchy right lung base atelectasis. Electronically Signed   By: VEAR Hurst M.D.   On: 02/21/2024 09:55    Assessment/Plan: S/P Procedure(s) (LRB): LOBECTOMY, LUNG, ROBOT-ASSISTED, USING VATS (Left) BLOCK, NERVE, INTERCOSTAL (Left) BIOPSY, LYMPH NODE (Left)  1 Tmax 99.1, VSS, sinus - will convert to po amio, BP too low to  resume entresto , holding on restarting jardiance  2 sats Ok on 3 liters except a couple readings in 80's, change to xopenex  3 good UOP 4 CT 980/24 h recorded, serosang, + air leak 5 CXR- appears stable c/w prev exam 6 no new labs- will repeat in am 7 c/o constipation- will add sorbitol  8 lovenox  for DVT ppx 9 pulm hygiene and  rehab modalities- routine  LOS: 3 days    Lemond FORBES Cera PA-C Pager [phone] 02/22/2024 Patient seen and examined, agree with above Converted to SR on IV amiodarone - convert to PO Relatively high output blood tinged serous + air leak- definitely decreased from initial but still significant.  Lung well expanded on CXR Discussed IBV placement.  He is aware of general nature of the procedure, the indications, risks, benefits and alternatives.  Also aware of need for bronch in 6-8 weeks to remove valves. Will make final decision re: valves tomorrow AM  Elspeth BROCKS. Kerrin, MD [facility] [phone]

## 2024-02-23 ENCOUNTER — Other Ambulatory Visit (HOSPITAL_COMMUNITY): Payer: Self-pay

## 2024-02-23 ENCOUNTER — Encounter (HOSPITAL_COMMUNITY)
Admission: RE | Disposition: A | Discharge status: Home Health | Payer: Self-pay | Source: Home / Self Care | Attending: Thoracic Surgery (Cardiothoracic Vascular Surgery)

## 2024-02-23 ENCOUNTER — Inpatient Hospital Stay (HOSPITAL_COMMUNITY)

## 2024-02-23 ENCOUNTER — Telehealth (HOSPITAL_COMMUNITY): Payer: Self-pay | Admitting: Pharmacy Technician

## 2024-02-23 DIAGNOSIS — J45909 Unspecified asthma, uncomplicated: Secondary | ICD-10-CM

## 2024-02-23 DIAGNOSIS — J449 Chronic obstructive pulmonary disease, unspecified: Secondary | ICD-10-CM

## 2024-02-23 DIAGNOSIS — J810 Acute pulmonary edema: Secondary | ICD-10-CM

## 2024-02-23 DIAGNOSIS — C3492 Malignant neoplasm of unspecified part of left bronchus or lung: Secondary | ICD-10-CM | POA: Diagnosis not present

## 2024-02-23 DIAGNOSIS — J95812 Postprocedural air leak: Secondary | ICD-10-CM

## 2024-02-23 DIAGNOSIS — I4891 Unspecified atrial fibrillation: Secondary | ICD-10-CM

## 2024-02-23 DIAGNOSIS — Z87891 Personal history of nicotine dependence: Secondary | ICD-10-CM | POA: Diagnosis not present

## 2024-02-23 DIAGNOSIS — J9382 Other air leak: Secondary | ICD-10-CM

## 2024-02-23 DIAGNOSIS — Z902 Acquired absence of lung [part of]: Secondary | ICD-10-CM

## 2024-02-23 DIAGNOSIS — J9601 Acute respiratory failure with hypoxia: Secondary | ICD-10-CM

## 2024-02-23 HISTORY — PX: VIDEO BRONCHOSCOPY: SHX5072

## 2024-02-23 HISTORY — PX: VIDEO BRONCHOSCOPY WITH INSERTION OF INTERBRONCHIAL VALVE (IBV): SHX6178

## 2024-02-23 LAB — CBC
HCT: 37.3 % — ABNORMAL LOW (ref 39.0–52.0)
Hemoglobin: 12.4 g/dL — ABNORMAL LOW (ref 13.0–17.0)
MCH: 33.1 pg (ref 26.0–34.0)
MCHC: 33.2 g/dL (ref 30.0–36.0)
MCV: 99.5 fL (ref 80.0–100.0)
Platelets: 201 K/uL (ref 150–400)
RBC: 3.75 MIL/uL — ABNORMAL LOW (ref 4.22–5.81)
RDW: 12.2 % (ref 11.5–15.5)
WBC: 8.9 K/uL (ref 4.0–10.5)
nRBC: 0 % (ref 0.0–0.2)

## 2024-02-23 LAB — BASIC METABOLIC PANEL WITH GFR
Anion gap: 8 (ref 5–15)
BUN: 13 mg/dL (ref 8–23)
CO2: 25 mmol/L (ref 22–32)
Calcium: 8.2 mg/dL — ABNORMAL LOW (ref 8.9–10.3)
Chloride: 102 mmol/L (ref 98–111)
Creatinine, Ser: 0.88 mg/dL (ref 0.61–1.24)
GFR, Estimated: >60 mL/min (ref >60)
Glucose, Bld: 107 mg/dL — ABNORMAL HIGH (ref 70–99)
Potassium: 4.6 mmol/L (ref 3.5–5.1)
Sodium: 135 mmol/L (ref 135–145)

## 2024-02-23 SURGERY — VIDEO BRONCHOSCOPY WITHOUT FLUORO
Anesthesia: General

## 2024-02-23 MED ORDER — ONDANSETRON HCL 4 MG/2ML IJ SOLN: 4.0000 mg | Freq: Once | INTRAMUSCULAR | Status: DC | PRN

## 2024-02-23 MED ORDER — AMIODARONE HCL IN DEXTROSE 360-4.14 MG/200ML-% IV SOLN
60.0000 mg/h | INTRAVENOUS | Status: DC
  Administered 2024-02-23 (×2): 60 mg/h via INTRAVENOUS
  Filled 2024-02-23: qty 200
  Filled 2024-02-23: qty 400

## 2024-02-23 MED ORDER — FENTANYL CITRATE (PF) 100 MCG/2ML IJ SOLN
25.0000 ug | INTRAMUSCULAR | Status: DC | PRN
  Administered 2024-02-23: 50 ug via INTRAVENOUS

## 2024-02-23 MED ORDER — OXYCODONE HCL 5 MG/5ML PO SOLN: 5.0000 mg | Freq: Once | ORAL | Status: DC | PRN

## 2024-02-23 MED ORDER — AMIODARONE LOAD VIA INFUSION: 150.0000 mg | Freq: Once | INTRAVENOUS | Status: DC

## 2024-02-23 MED ORDER — ALBUTEROL SULFATE HFA 108 (90 BASE) MCG/ACT IN AERS
INHALATION_SPRAY | RESPIRATORY_TRACT | Status: DC | PRN
Start: 2024-02-23 — End: 2024-02-23
  Administered 2024-02-23: 4 via RESPIRATORY_TRACT

## 2024-02-23 MED ORDER — FUROSEMIDE 10 MG/ML IJ SOLN
INTRAMUSCULAR | Status: AC
  Filled 2024-02-23: qty 4

## 2024-02-23 MED ORDER — FENTANYL CITRATE (PF) 100 MCG/2ML IJ SOLN
INTRAMUSCULAR | Status: AC
Start: 2024-02-23 — End: 2024-02-23
  Filled 2024-02-23: qty 2

## 2024-02-23 MED ORDER — AMIODARONE IV BOLUS ONLY 150 MG/100ML
150.0000 mg | Freq: Once | INTRAVENOUS | Status: AC
  Administered 2024-02-23: 150 mg via INTRAVENOUS
  Filled 2024-02-23: qty 100

## 2024-02-23 MED ORDER — BUDESONIDE 0.5 MG/2ML IN SUSP
0.5000 mg | Freq: Two times a day (BID) | RESPIRATORY_TRACT | Status: DC
  Administered 2024-02-23 – 2024-03-03 (×17): 0.5 mg via RESPIRATORY_TRACT
  Filled 2024-02-23 (×18): qty 2

## 2024-02-23 MED ORDER — AMIODARONE HCL IN DEXTROSE 360-4.14 MG/200ML-% IV SOLN
30.0000 mg/h | INTRAVENOUS | Status: DC
  Administered 2024-02-24 – 2024-02-26 (×5): 30 mg/h via INTRAVENOUS
  Filled 2024-02-23 (×5): qty 200

## 2024-02-23 MED ORDER — APIXABAN 5 MG PO TABS
5.0000 mg | ORAL_TABLET | Freq: Two times a day (BID) | ORAL | Status: DC
  Administered 2024-02-24 – 2024-03-03 (×17): 5 mg via ORAL
  Filled 2024-02-23 (×17): qty 1

## 2024-02-23 MED ORDER — PHENYLEPHRINE 80 MCG/ML (10ML) SYRINGE FOR IV PUSH (FOR BLOOD PRESSURE SUPPORT)
PREFILLED_SYRINGE | INTRAVENOUS | Status: DC | PRN
  Administered 2024-02-23: 40 ug via INTRAVENOUS

## 2024-02-23 MED ORDER — ACETAMINOPHEN 10 MG/ML IV SOLN: 1000.0000 mg | Freq: Once | INTRAVENOUS | Status: DC | PRN

## 2024-02-23 MED ORDER — PHENYLEPHRINE HCL-NACL 20-0.9 MG/250ML-% IV SOLN: INTRAVENOUS | Status: DC | PRN

## 2024-02-23 MED ORDER — OXYCODONE HCL 5 MG PO TABS: 5.0000 mg | ORAL_TABLET | Freq: Once | ORAL | Status: DC | PRN

## 2024-02-23 MED ORDER — PROPOFOL 500 MG/50ML IV EMUL
INTRAVENOUS | Status: DC | PRN
  Administered 2024-02-23: 150 ug/kg/min via INTRAVENOUS

## 2024-02-23 MED ORDER — SODIUM CHLORIDE 0.9 % IV SOLN: INTRAVENOUS | Status: DC

## 2024-02-23 MED ORDER — FUROSEMIDE 10 MG/ML IJ SOLN
20.0000 mg | Freq: Once | INTRAMUSCULAR | Status: AC
  Administered 2024-02-23: 20 mg via INTRAVENOUS

## 2024-02-23 MED ORDER — LIDOCAINE 2% (20 MG/ML) 5 ML SYRINGE
INTRAMUSCULAR | Status: DC | PRN
  Administered 2024-02-23: 100 mg via INTRAVENOUS

## 2024-02-23 MED ORDER — SODIUM CHLORIDE 0.9 % IV SOLN
INTRAVENOUS | Status: DC | PRN
  Administered 2024-02-23: 500 mL via INTRAMUSCULAR

## 2024-02-23 MED ORDER — SUCCINYLCHOLINE CHLORIDE 200 MG/10ML IV SOSY
PREFILLED_SYRINGE | INTRAVENOUS | Status: DC | PRN
Start: 2024-02-23 — End: 2024-02-23
  Administered 2024-02-23: 100 mg via INTRAVENOUS

## 2024-02-23 MED ORDER — EPHEDRINE SULFATE-NACL 50-0.9 MG/10ML-% IV SOSY
PREFILLED_SYRINGE | INTRAVENOUS | Status: DC | PRN
  Administered 2024-02-23: 10 mg via INTRAVENOUS

## 2024-02-23 MED ORDER — PROPOFOL 1000 MG/100ML IV EMUL
INTRAVENOUS | Status: AC
  Filled 2024-02-23: qty 100

## 2024-02-23 MED ORDER — SUGAMMADEX SODIUM 200 MG/2ML IV SOLN
INTRAVENOUS | Status: DC | PRN
  Administered 2024-02-23 (×2): 400 mg via INTRAVENOUS

## 2024-02-23 MED ORDER — ONDANSETRON HCL 4 MG/2ML IJ SOLN
INTRAMUSCULAR | Status: DC | PRN
  Administered 2024-02-23: 4 mg via INTRAVENOUS

## 2024-02-23 MED ORDER — ARFORMOTEROL TARTRATE 15 MCG/2ML IN NEBU
15.0000 ug | INHALATION_SOLUTION | Freq: Two times a day (BID) | RESPIRATORY_TRACT | Status: DC
  Administered 2024-02-23 – 2024-03-03 (×18): 15 ug via RESPIRATORY_TRACT
  Filled 2024-02-23 (×18): qty 2

## 2024-02-23 MED ORDER — ROCURONIUM BROMIDE 10 MG/ML (PF) SYRINGE
PREFILLED_SYRINGE | INTRAVENOUS | Status: DC | PRN
  Administered 2024-02-23: 50 mg via INTRAVENOUS

## 2024-02-23 MED ORDER — REVEFENACIN 175 MCG/3ML IN SOLN
175.0000 ug | Freq: Every day | RESPIRATORY_TRACT | Status: DC
  Administered 2024-02-24 – 2024-03-03 (×9): 175 ug via RESPIRATORY_TRACT
  Filled 2024-02-23 (×11): qty 3

## 2024-02-23 MED ORDER — LACTATED RINGERS IV SOLN: INTRAVENOUS | Status: DC | PRN

## 2024-02-23 MED ORDER — PROPOFOL 10 MG/ML IV BOLUS
INTRAVENOUS | Status: DC | PRN
  Administered 2024-02-23: 100 mg via INTRAVENOUS

## 2024-02-23 MED ORDER — FENTANYL CITRATE (PF) 250 MCG/5ML IJ SOLN
INTRAMUSCULAR | Status: DC | PRN
  Administered 2024-02-23 (×2): 50 ug via INTRAVENOUS

## 2024-02-23 MED ORDER — DEXAMETHASONE SODIUM PHOSPHATE 10 MG/ML IJ SOLN
INTRAMUSCULAR | Status: DC | PRN
  Administered 2024-02-23: 5 mg via INTRAVENOUS

## 2024-02-23 MED ORDER — AMIODARONE LOAD VIA INFUSION
150.0000 mg | Freq: Once | INTRAVENOUS | Status: AC
  Administered 2024-02-23: 150 mg via INTRAVENOUS
  Filled 2024-02-23: qty 83.34

## 2024-02-23 NOTE — Progress Notes (Signed)
 Patient transported from PACU to 2H16 on 100% fi02 while on BIPAP. No complications noted.

## 2024-02-23 NOTE — Transfer of Care (Signed)
 Immediate Anesthesia Transfer of Care Note  Patient: Brett [name]  Procedure(s) Performed: VIDEO BRONCHOSCOPY WITHOUT FLUORO BRONCHOSCOPY, FLEXIBLE, WITH INTRABRONCHIAL VALVE INSERTION  Patient Location: PACU  Anesthesia Type:General  Level of Consciousness: awake, alert , and oriented  Airway & Oxygen Therapy: Patient Spontanous Breathing and non-rebreather face mask  Post-op Assessment: Report given to RN and Post -op Vital signs reviewed and stable  Post vital signs: Reviewed and stable  Last Vitals:  Vitals Value Taken Time  BP 130/80 1649  Temp 36 1649  Pulse 136 02/23/24 16:49  Resp 21 02/23/24 16:49  SpO2 93 % 02/23/24 16:49  Vitals shown include unfiled device data.  Last Pain:  Vitals:   02/23/24 1322  TempSrc: Temporal  PainSc: 2       Patients Stated Pain Goal: 0 (02/21/24 0535)  Complications: No notable events documented.  Dr. Corinne and Dr. Kerrin at bedside.

## 2024-02-23 NOTE — Brief Op Note (Addendum)
 02/23/2024  4:33 PM  PATIENT:  Brett [name]  68 y.o. male  PRE-OPERATIVE DIAGNOSIS:  Hypoxia post IBV placement  POST-OPERATIVE DIAGNOSIS:  same  Dr Kerrin placed two bronchial valves for air leak.  After pt was extubated pt oxygen saturation was 80% on NRB mask. Dr. Kerrin initiated emergent re-intubation Brett [name]) with re-scope by Dr. Kerrin to remove both bronchial valves. both bronchial valves removed.  PROCEDURE:  BRONCHOSCOPY, REMOVAL of IBV x 2  SURGEON:  Surgeons and Role:    * Kerrin Elspeth BROCKS, [name] - Primary  PHYSICIAN ASSISTANT:   ASSISTANTS: none   ANESTHESIA:   general  EBL:  none   BLOOD ADMINISTERED:none  DRAINS: none   LOCAL MEDICATIONS USED:  NONE  SPECIMEN:  No Specimen  DISPOSITION OF SPECIMEN:  N/A  COUNTS:  NO endo  TOURNIQUET:  * No tourniquets in log *  DICTATION: .Other Dictation: Dictation Number -  PLAN OF CARE: already inpatient  PATIENT DISPOSITION:  PACU - hemodynamically stable.   Delay start of Pharmacological VTE agent (>24hrs) due to surgical blood loss or risk of bleeding: no

## 2024-02-23 NOTE — Anesthesia Procedure Notes (Signed)
 Procedure Name: Intubation Date/Time: 02/23/2024 2:44 PM  Performed by: Scherrie Mast, CRNAPre-anesthesia Checklist: Patient identified, Emergency Drugs available, Suction available and Patient being monitored Patient Re-evaluated:Patient Re-evaluated prior to induction Oxygen Delivery Method: Circle System Utilized Preoxygenation: Pre-oxygenation with 100% oxygen Induction Type: IV induction Ventilation: Mask ventilation without difficulty Laryngoscope Size: Mac, 3 and Glidescope Grade View: Grade I Tube type: Oral Tube size: 8.5 mm Number of attempts: 1 Airway Equipment and Method: Stylet and Oral airway Placement Confirmation: ETT inserted through vocal cords under direct vision, positive ETCO2 and breath sounds checked- equal and bilateral Secured at: 22 cm Tube secured with: Tape Dental Injury: Teeth and Oropharynx as per pre-operative assessment

## 2024-02-23 NOTE — Interval H&P Note (Signed)
 History and Physical Interval Note:  Air leak postop unable to transition to water seal Will proceed with Bronch/ IBV placement D/w patient and his daughter.  They are aware of the indications, risks, benefits and alternatives.  He understands need for 2nd procedure in 6-8 weeks to remove IBV  02/23/2024 1:13 PM  Brett [name]  has presented today for surgery, with the diagnosis of air leak.  The various methods of treatment have been discussed with the patient and family. After consideration of risks, benefits and other options for treatment, the patient has consented to  Procedure(s) with comments: VIDEO BRONCHOSCOPY WITHOUT FLUORO (N/A) - Bronch with IBV insertion BRONCHOSCOPY, FLEXIBLE, WITH INTRABRONCHIAL VALVE INSERTION (N/A) as a surgical intervention.  The patient's history has been reviewed, patient examined, no change in status, stable for surgery.  I have reviewed the patient's chart and labs.  Questions were answered to the patient's satisfaction.     Brett [name]

## 2024-02-23 NOTE — Brief Op Note (Signed)
 02/23/2024  3:18 PM  PATIENT:  Brett [name]  68 y.o. male  PRE-OPERATIVE DIAGNOSIS:  air leak  POST-OPERATIVE DIAGNOSIS:  air leak  PROCEDURE:  Procedure(s) with comments: VIDEO BRONCHOSCOPY WITHOUT FLUORO (N/A) - Bronch with IBV insertion BRONCHOSCOPY, FLEXIBLE, WITH INTRABRONCHIAL VALVE INSERTION (N/A)   7 mm valve lingular bronchus 9 mm valve anteroapical/ posterior bronchus  SURGEON:  Surgeons and Role:    * Kerrin Elspeth BROCKS, MD - Primary  PHYSICIAN ASSISTANT:   ASSISTANTS: none   ANESTHESIA:   general  EBL:  none   BLOOD ADMINISTERED:none  DRAINS: IBV x 2   LOCAL MEDICATIONS USED:  NONE  SPECIMEN:  No Specimen  DISPOSITION OF SPECIMEN:  N/A  COUNTS:  NO endoscopic  TOURNIQUET:  * No tourniquets in log *  DICTATION: .Other Dictation: Dictation Number -  PLAN OF CARE: already inpatient  PATIENT DISPOSITION:  PACU - hemodynamically stable.   Delay start of Pharmacological VTE agent (>24hrs) due to surgical blood loss or risk of bleeding: no

## 2024-02-23 NOTE — Discharge Instructions (Addendum)
 Discharge Instructions:  1. You may shower, please wash incisions daily with soap and water and keep dry.  If you wish to cover wounds with dressing you may do so but please keep clean and change daily.  No tub baths or swimming until incisions have completely healed.  If your incisions become red or develop any drainage please call our office at [phone]  2. No Driving until cleared by Dr. Chrystal office and you are no longer using narcotic pain medications  3. Fever of 101.5 for at least 24 hours with no source, please contact our office at [phone]  4. Activity- up as tolerated, please walk at least 3 times per day.  Avoid strenuous activity,   5. If any questions or concerns arise, please do not hesitate to contact our office at [phone]   =================================================================================================== Information on my medicine - ELIQUIS (apixaban)  This medication education was reviewed with me or my healthcare representative as part of my discharge preparation.   Why was Eliquis prescribed for you? Eliquis was prescribed for you to reduce the risk of a blood clot forming that can cause a stroke if you have a medical condition called atrial fibrillation (a type of irregular heartbeat).  What do You need to know about Eliquis ? Take your Eliquis TWICE DAILY - one tablet in the morning and one tablet in the evening with or without food. If you have difficulty swallowing the tablet whole please discuss with your pharmacist how to take the medication safely.  Take Eliquis exactly as prescribed by your doctor and DO NOT stop taking Eliquis without talking to the doctor who prescribed the medication.  Stopping may increase your risk of developing a stroke.  Refill your prescription before you run out.  After discharge, you should have regular check-up appointments with your healthcare provider that is prescribing your Eliquis.   In the future your dose may need to be changed if your kidney function or weight changes by a significant amount or as you get older.  What do you do if you miss a dose? If you miss a dose, take it as soon as you remember on the same day and resume taking twice daily.  Do not take more than one dose of ELIQUIS at the same time to make up a missed dose.  Important Safety Information A possible side effect of Eliquis is bleeding. You should call your healthcare provider right away if you experience any of the following: Bleeding from an injury or your nose that does not stop. Unusual colored urine (red or dark brown) or unusual colored stools (red or black). Unusual bruising for unknown reasons. A serious fall or if you hit your head (even if there is no bleeding).  Some medicines may interact with Eliquis and might increase your risk of bleeding or clotting while on Eliquis. To help avoid this, consult your healthcare provider or pharmacist prior to using any new prescription or non-prescription medications, including herbals, vitamins, non-steroidal anti-inflammatory drugs (NSAIDs) and supplements.  This website has more information on Eliquis (apixaban): http://www.eliquis.com/eliquis/home

## 2024-02-23 NOTE — Op Note (Signed)
 NAME: Ozawa, Liem S. MEDICAL RECORD NO: [id number] ACCOUNT NO: [id number] DATE OF BIRTH: October 23, 1955 FACILITY: [facility] LOCATION: [facility] PHYSICIAN: Elspeth BROCKS. Kerrin, MD  Operative Report   DATE OF PROCEDURE: 02/23/2024  PREOPERATIVE DIAGNOSIS:  Air leak post lobectomy.  POSTOPERATIVE DIAGNOSIS:  Air leak post lobectomy.  PROCEDURE:  Bronchoscopy for intrabronchial valve placement x 2.  SURGEON:  Elspeth BROCKS. Kerrin, MD.  ASSISTANT:  None.  ANESTHESIA:  General.  FINDINGS:  Unable to isolate air leak to specific bronchus.  A 7 mm valve was placed in the lingular bronchus and 9 mm valve was placed in the anterior apical posterior segmental bronchus with resolution of air leak.  CLINICAL NOTE:  Mr. Chicoine is a 68 year old gentleman who has undergone a left lower lobectomy.  He had a large air leak postoperatively and was advised to undergo intrabronchial valve placement to try to eliminate the air leak. The indications, risks, benefits, and alternatives were discussed in detail with the patient.  He accepted the risks and agreed to proceed.  OPERATIVE NOTE:  Mr. Garfinkle was brought to the endoscopy suite on 02/23/2024.  He had induction of general anesthesia and was intubated.  A timeout was performed.  Flexible fibrotic bronchoscopy was performed via the endotracheal tube.  On the right side, there was normal endobronchial anatomy with no endobronchial lesions to the level of the subsegmental bronchi.  On the left side, the left mainstem bronchus was normal.  The left lower lobe bronchial stump was well healed. There was normal anatomy of the lingular and anterior apical posterior segmental bronchi with no endobronchial lesions to the level of the subsegmental bronchi. A Fogarty catheter then was inserted and selective obstruction of the bronchi was performed.  The air leak could not be isolated to any one particular segment or group of segments, but occlusion of the left upper lobe  bronchus did eliminate the air leak completely. The sizing balloon then was used to size the lingular bronchus as well as the remainder of the left upper lobe bronchus. A 7-mm valve was placed in the lingular bronchus. This was relatively deep and was later removed and a second valve was deployed with good placement at the origin of the bronchus with good occlusion.  A 9 mm valve then was placed in the  remaining bronchus with good positioning and good apposition to the bronchial wall. There was resolution of the air leak.  The patient was extubated in the operating room; however, became hypoxic and we were not able to get his saturations to improve with noninvasive means and the decision was made to remove the endobronchial valves after a chest x-ray showed complete white out of the left lung and marked overinflation of the right lung.    MUK D: 02/23/2024 4:41:26 pm T: 02/23/2024 10:23:00 pm  JOB: [id number]/ [id number]

## 2024-02-23 NOTE — Consult Note (Signed)
 NAME:  Brett [name], MRN:  [id number], DOB:  23-Oct-1955, LOS: 4 ADMISSION DATE:  02/19/2024, CONSULTATION DATE:  7/18 REFERRING MD:  hendrickson, CHIEF COMPLAINT:  acute resp failure   History of Present Illness:  68 year old male patient with a history of tobacco abuse, COPD, paroxysmal atrial fibrillation and SVT, multiple orthopedic surgeries, and recent diagnosis with adenocarcinoma stage Ia-IIb for which she underwent a left video-assisted thoracotomy with lobectomy initially completed on 7/15 His postoperative course was complicated by an ongoing bronchopleural fistula with continuous airleak noted by chest tube, he was brought to the operating room once again on 7/18 at which time he he underwent placement of 2 endobronchial valves.  Postoperatively the patient was extubated, pulse oximetry was 80% on nonrebreather, chest x-ray showed complete opacification of the left hemithorax.  He was subsequently reintubated, and bronchoscopically the IPV's were removed.  Extubated once again in the recovery room, requiring nonrebreather, with marked work of breathing and atrial fibrillation with RVR.  Received IV Lasix, portable chest x-ray showed improvement of aeration on the left hemithorax with diffuse subcutaneous air, but what appeared to be reexpansion pulmonary edema of note the chest tube did not demonstrate air leak.  Given his underlying pulmonary status, and our involvement preoperatively pulmonary team was asked to assist with his postoperative management Given his marked increased work of breathing we opted to place him on noninvasive positive pressure ventilation knowing he still had his left chest tube in place.  The decision was made to recover him in the intensive care Pertinent  Medical History  Tobacco abuse, Copd, Afib, LLL adenocarcinoma, left VATS w/ robotic left lower lobectomy, congenital hip deformity, cervical disc disease.  Ventricular tachycardia, glaucoma,  rosacea   Significant Hospital Events: Including procedures, antibiotic start and stop dates in addition to other pertinent events   7/15 left video-assisted thoracotomy with left lower lobe lobectomy, for stage Ia/IIb adenocarcinoma 7/15 through 7/17 ongoing BPF involving the left hemithorax with ongoing hypoxia postoperatively 7/18 brought to the OR where he underwent left endobronchial valve placement x 2, postoperatively hypoxic with complete collapse of the left hemithorax necessitating emergent removal of valves.  Postprocedural chest x-ray showing improved aeration with reexpansion edema, continued to have increased respiratory effort necessitating noninvasive positive pressure ventilation  Interim History / Subjective:  Marked accessory use coughing clear secretions, initially on nonrebreather  Objective    Blood pressure (!) 145/100, pulse (!) 129, temperature 98.6 F (37 C), resp. rate (!) 30, height 6' 2 (1.88 m), weight 72.6 kg, SpO2 94%.        Intake/Output Summary (Last 24 hours) at 02/23/2024 1713 Last data filed at 02/23/2024 1631 Gross per 24 hour  Intake 812 ml  Output 950 ml  Net -138 ml   Filed Weights   02/19/24 0550  Weight: 72.6 kg    Examination: General: 68 year old male patient initially on high flow oxygen with marked accessory use,, on follow-up after placement of positive pressure work of breathing improved, he was initially diaphoretic, and pale, this is improving HEENT normocephalic atraumatic now has BiPAP mask in place no clear JVD Pulmonary diffuse scattered rhonchi with tactile fremitus marked subcutaneous air palpable over the entire anterior thorax, the left chest tube, is to 20 cm wall suction currently no airleak noted Portable chest x-ray personally reviewed This demonstrates improved aeration of the left hemithorax with what appears to be element of reexpansion pulmonary edema with diffuse subcutaneous air Cardiac tachycardic irregular  irregular, currently receiving amiodarone  bolus Abdomen  soft nontender Amities are warm and dry with brisk capillary refill GU clear yellow voiding  Resolved problem list   Assessment and Plan   Acute hypoxic respiratory failure secondary to mixed picture of left hemithorax lung collapse following placement of endobronchial valves, subsequently removed and further complicated by reexpansion pulmonary edema Plan Supplemental oxygen Placing the patient on noninvasive positive pressure ventilation Already has received IV Lasix Continuous pulse oximetry Admit to ICU postop Pulmonary hygiene A.m. 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 status post left video-assisted thoracotomy with lower lobe lobectomy complicated by bronchopleural fistula -Attempted endobronchial valve valves, however had to be retrieved due to left hemithorax collapse -Currently no airleak on chest tube Plan Continuing chest tube to suction Multimodal postoperative pain management Mobilization postop as able Treat fevers Ensuring euglycemia  COPD with history of tobacco abuse Plan Will initiate scheduled bronchodilators, this will include triple combination regimen of Brovana, budesonide, and Yupelri Continue to encourage smoking cessation  Atrial fibrillation with RVR, history of hypertension, and grade 1 diastolic dysfunction Seems exacerbated by work of breathing, already received 250 mg of amiodarone  Plan Initiate additional amiodarone  load and infusion Postoperative Eliquis Continue telemetry Ensure potassium greater than 4 and magnesium  greater than 2 Resume postoperative aspirin  when okay with surgical team If stable overnight resume Lopressor  in the morning Holding his Entresto  at this point  History of glaucoma Plan Continue postoperative eyedrops  Best Practice (right click and Reselect all SmartList Selections daily)   Diet/type: NPO w/ oral meds DVT  prophylaxis: SCD's Start: 02/19/24 1351 apixaban (ELIQUIS) tablet 5 mg   Pressure ulcers present: N/A GI prophylaxis: PPI Lines: Central line Foley:  N/A Code Status:  full code Last date of multidisciplinary goals of care discussion [NA]   My cct 34 min  Labs   CBC: Recent Labs  Lab 02/20/24 0619 02/21/24 0114 02/23/24 0219  WBC 13.3* 11.0* 8.9  HGB 12.4* 12.0* 12.4*  HCT 36.4* 36.2* 37.3*  MCV 100.8* 100.8* 99.5  PLT 172 168 201    Basic Metabolic Panel: Recent Labs  Lab 02/20/24 0619 02/21/24 0114 02/23/24 0219  NA 135 133* 135  K 4.2 4.3 4.6  CL 102 102 102  CO2 23 22 25   GLUCOSE 198* 125* 107*  BUN 18 20 13   CREATININE 0.90 1.01 0.88  CALCIUM  8.3* 7.9* 8.2*  MG 2.0  --   --    GFR: Estimated Creatinine Clearance: 81.4 mL/min (by C-G formula based on SCr of 0.88 mg/dL). Recent Labs  Lab 02/20/24 0619 02/21/24 0114 02/23/24 0219  WBC 13.3* 11.0* 8.9    Liver Function Tests: Recent Labs  Lab 02/21/24 0114  AST 34  ALT 23  ALKPHOS 41  BILITOT 0.4  PROT 5.4*  ALBUMIN 2.7*   No results for input(s): LIPASE, AMYLASE in the last 168 hours. No results for input(s): AMMONIA in the last 168 hours.  ABG No results found for: PHART, PCO2ART, PO2ART, HCO3, TCO2, ACIDBASEDEF, O2SAT   Coagulation Profile: No results for input(s): INR, PROTIME in the last 168 hours.  Cardiac Enzymes: No results for input(s): CKTOTAL, CKMB, CKMBINDEX, TROPONINI in the last 168 hours.  HbA1C: No results found for: HGBA1C  CBG: No results for input(s): GLUCAP in the last 168 hours.  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 (08/06/2023), Pain of left hip joint (11/01/2018), Paroxysmal SVT (supraventricular tachycardia) (HCC), incidental finding IntraOp back surgery requiring esmolol ; event monitor November 2024 for short runs, longest episode 17 beats. (06/19/2023), Personal history of congenital hip dysplasia (05/30/2013), Preventative health care (01/29/2015), Rhinitis, allergic (09/22/2015), Rosacea (05/30/2013), Scoliosis deformity of spine (05/05/2020), Spinal stenosis of lumbar region (06/16/2023), Spondylolisthesis at L5-S1 level (09/14/2020), and Tobacco abuse (05/30/2013).   Surgical History:   Past Surgical History:  Procedure Laterality Date   ANTERIOR LAT LUMBAR FUSION Right 05/09/2023   Procedure: Extreme Lateral Interbody Fusion Lumbar three-four -right;  Surgeon: Louis Shove, MD;  Location: [facility] OR;  Service: Neurosurgery;  Laterality: Right;  3C   BRONCHOSCOPY, WITH BIOPSY USING ELECTROMAGNETIC NAVIGATION Bilateral 01/09/2024   Procedure: ROBOTIC ASSISTED NAVIGATIONAL BRONCHOSCOPY;  Surgeon: Isadora Hose, MD;  Location: [facility] ORS;  Service: Pulmonary;  Laterality: Bilateral;   COLONOSCOPY  06/17/2015   Pyrtle   COLONOSCOPY  07/20/2020   Pyrtle   COLONOSCOPY  02/15/2024   ENDOBRONCHIAL ULTRASOUND Bilateral 01/09/2024   Procedure: ENDOBRONCHIAL ULTRASOUND (EBUS);  Surgeon: Isadora Hose, MD;  Location: [facility] ORS;  Service: Pulmonary;  Laterality:  Bilateral;   HAMMER TOE SURGERY  07/28/2011   Procedure: HAMMER TOE CORRECTION;  Surgeon: Toribio JULIANNA Chancy, MD;  Location: [facility];  Service: Orthopedics;  Laterality: Right;  right 2nd and 4th toes correction hammer toe, capsulotomy metatarsal-phalangeal joints   HERNIA REPAIR Bilateral    HIP SURGERY  1986 & 2010   rt total hip-8/10-multiple rt hip surgeries- had 4 prior to 1986 as a child   INTERCOSTAL NERVE BLOCK Left 02/19/2024   Procedure: BLOCK, NERVE, INTERCOSTAL;  Surgeon: Kerrin Elspeth BROCKS, MD;  Location: [facility] OR;  Service: Thoracic;  Laterality: Left;   JOINT REPLACEMENT  2010   Partial Right Hip   LOBECTOMY, LUNG, ROBOT-ASSISTED, USING VATS Left 02/19/2024   Procedure: LOBECTOMY, LUNG, ROBOT-ASSISTED, USING VATS;  Surgeon: Kerrin Elspeth BROCKS, MD;  Location: [facility] OR;  Service: Thoracic;  Laterality: Left;  ROBOTIC LEFT LOWER LOBECTOMY   lumber fusion  09/2020   POLYPECTOMY     SENTINEL NODE BIOPSY Left 02/19/2024   Procedure: BIOPSY, LYMPH NODE;  Surgeon: Kerrin Elspeth BROCKS, MD;  Location: [facility] OR;  Service: Thoracic;  Laterality: Left;   SPINE SURGERY  09/14/2020   THUMB ARTHROSCOPY  2008   rt   TOTAL HIP ARTHROPLASTY Right 1985     Social History:   reports that he quit smoking about 2 months ago. His smoking use included cigarettes. He started smoking about 50 years ago. He has a 25.2 pack-year smoking history. He has never used smokeless tobacco. He reports current alcohol use of about 14.0 standard drinks of alcohol per week. He reports that he does not use drugs.   Family History:  His family history includes Alcohol abuse in his brother; Cancer in his cousin; Cancer (age of onset: 79) in his mother; Colon cancer in his cousin and maternal grandmother; Colon cancer (age of onset: 51) in his mother; Colon polyps in  his mother; Depression in his brother; Endocrine tumor in his daughter; Lung disease in his father; Rosacea in his father and mother; Thyroid   disease in his son. There is no history of Esophageal cancer, Rectal cancer, or Stomach cancer.   Allergies No Known Allergies   Home Medications  Prior to Admission medications   Medication Sig Start Date End Date Taking? Authorizing Provider  aspirin  EC 81 MG tablet Take 1 tablet (81 mg total) by mouth daily. Swallow whole. 12/07/23  Yes [name], [name], MD  calcium  carbonate (TUMS EX) 750 MG chewable tablet Chew 1-2 tablets by mouth 2 (two) times daily as needed for heartburn.   Yes [provider]  empagliflozin  (JARDIANCE ) 10 MG TABS tablet Take 1 tablet (10 mg total) by mouth daily before breakfast. 12/07/23  Yes [name], [name], MD  metoprolol  succinate (TOPROL  XL) 25 MG 24 hr tablet Take 1 tablet (25 mg total) by mouth daily. 12/04/23  Yes [name], [name], MD  Multiple Vitamin (MULTIVITAMIN WITH MINERALS) TABS tablet Take 1 tablet by mouth in the morning.   Yes [provider]  Na Sulfate-K Sulfate-Mg Sulfate concentrate (SUPREP) 17.5-3.13-1.6 GM/177ML SOLN Take 1 kit (354 mLs total) by mouth as directed. 01/23/24  Yes McMichael, Bayley M, PA-C  Oxycodone  HCl 10 MG TABS Take 1 tablet (10 mg total) by mouth every 6 (six) hours as needed (Pain). 05/10/23  Yes Pool, Victory, MD  sacubitril-valsartan (ENTRESTO ) 24-26 MG Take 1 tablet by mouth 2 (two) times daily. 12/07/23  Yes [name], [name], MD  STELARA 45 MG/0.5ML injection Inject 45 mg into the skin as directed. Every 12 weeks   Yes [provider]  Timolol  Maleate, Once-Daily, 0.5 % SOLN Place 1 drop into both eyes in the morning and at bedtime. 01/29/24  Yes [provider]  TRAVATAN Z 0.004 % SOLN ophthalmic solution Place 1 drop into both eyes every morning. 01/20/15  Yes [provider]  varenicline  (CHANTIX  CONTINUING MONTH PAK) 1 MG tablet Take 1 tablet (1 mg total) by mouth 2 (two) times daily. 01/02/24  Yes [name], NP     Critical care time: 

## 2024-02-23 NOTE — Telephone Encounter (Signed)
 Pharmacy Patient Advocate Encounter  Insurance verification completed.    The patient is insured through [employer]. Patient has Medicare and is not eligible for a copay card, but may be able to apply for patient assistance or Medicare RX Payment Plan (Patient Must reach out to their plan, if eligible for payment plan), if available.    Ran test claim for Eliquis 5mg  tablets and the current 30 day co-pay is $152.71.   This test claim was processed through [facility]- copay amounts may vary at other pharmacies due to pharmacy/plan contracts, or as the patient moves through the different stages of their insurance plan.

## 2024-02-23 NOTE — Op Note (Signed)
 NAME: Geno, Corrion S. MEDICAL RECORD NO: [id number] ACCOUNT NO: [id number] DATE OF BIRTH: 05-14-1956 FACILITY: [facility] LOCATION: [facility] PHYSICIAN: Elspeth BROCKS. Kerrin, MD  Operative Report   DATE OF PROCEDURE: 02/23/2024  PREOPERATIVE DIAGNOSIS:  Hypoxia post intrabronchial valve placement.  POSTOPERATIVE DIAGNOSIS:  Hypoxia post intrabronchial valve placement.  PROCEDURE:  Bronchoscopy for removal of intrabronchial valve x2.  SURGEON:  Elspeth BROCKS. Kerrin, MD.  ASSISTANT:  None.  ANESTHESIA:  General.  FINDINGS:  Valves were removed without difficulty.  Small amount of secretions, which were cleared with saline, improved oxygen saturations post removal.  CLINICAL NOTE:  Mr. Costabile is a 68 year old gentleman who had an air leak post lobectomy and had undergone intrabronchial valve placement.  After being extubated, he became hypoxic and was refractory to even 100% non-rebreather. Chest x-ray showed complete white out of the left lung and hyperexpansion of the right lung.  Failure to improve with noninvasive means was an indication to remove the endobronchial valves.  The patient was alert and cooperative and agreed to removal of the valves.  OPERATIVE NOTE:  The patient had induction of general anesthesia and was re-intubated.  A timeout was performed.  Flexible fiberoptic bronchoscopy was performed via the endotracheal tube.  The two intrabronchial valves that had been placed were grasped individually one at a time and removed.  There were some mild secretions.  These were irrigated with saline and cleared. There were no airway obstructions. The patient's saturations did improve.  The bronchoscope was removed.  The patient then was extubated in the operating room and taken to the post-anesthetic care unit in fair condition.   NIK D: 02/23/2024 4:44:05 pm T: 02/23/2024 10:39:00 pm  JOB: [id number]/ [id number]

## 2024-02-23 NOTE — Discharge Summary (Cosign Needed)
 [address] [place] [address]             [phone]        Physician Discharge Summary  Patient ID: Brett [name] MRN: [id number] DOB/AGE: [birth year] 70 y.o.  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 08/06/2023   Paroxysmal SVT (supraventricular tachycardia) (HCC), incidental finding IntraOp back surgery requiring esmolol ; event monitor November 2024 for short runs, longest episode 17 beats. 06/19/2023   Spinal stenosis of lumbar region 06/16/2023   Allergy    Arthritis    Blood transfusion without reported diagnosis    Chicken pox    Congenital hip deformity    History of cardiac dysrhythmia 05/23/2023   Lumbar adjacent segment disease with spondylolisthesis 05/09/2023   Corn of foot 08/09/2022   Spondylolisthesis at L5-S1 level 09/14/2020   Isthmic spondylolisthesis 05/05/2020   Scoliosis deformity of spine 05/05/2020   Neuromuscular disorder (HCC) 11/2019   Pain of left hip joint 11/01/2018   Cervical neck pain with evidence of disc disease 09/22/2015   Erectile dysfunction 09/22/2015   Rhinitis, allergic 09/22/2015   Bilateral low back pain without sciatica 09/22/2015   Need for shingles vaccine 09/22/2015   Degenerative disc disease, cervical 01/29/2015   Preventative health care 01/29/2015   Tobacco abuse 05/30/2013   Rosacea 05/30/2013   Eczema 05/30/2013   Glaucoma 05/30/2013   Personal history of congenital hip dysplasia 05/30/2013   Discharge Diagnoses:   Patient Active Problem List   Diagnosis Date Noted   S/P  partial lobectomy of lung 02/19/2024   S/P lobectomy of lung 02/19/2024   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 08/06/2023   Paroxysmal SVT (supraventricular tachycardia) (HCC), incidental finding IntraOp back surgery requiring esmolol ; event monitor November 2024 for short runs, longest episode 17 beats. 06/19/2023   Spinal stenosis of lumbar region 06/16/2023   Allergy    Arthritis    Blood transfusion without reported diagnosis    Chicken pox    Congenital hip deformity    History of cardiac dysrhythmia 05/23/2023   Lumbar adjacent segment disease with spondylolisthesis 05/09/2023   Corn of foot 08/09/2022   Spondylolisthesis at L5-S1 level 09/14/2020   Isthmic spondylolisthesis 05/05/2020   Scoliosis deformity of spine 05/05/2020   Neuromuscular disorder (HCC) 11/2019   Pain of left hip joint 11/01/2018   Cervical neck pain with evidence of disc disease 09/22/2015   Erectile dysfunction 09/22/2015   Rhinitis, allergic 09/22/2015   Bilateral low back pain without sciatica 09/22/2015   Need for shingles vaccine 09/22/2015   Degenerative disc disease, cervical 01/29/2015   Preventative health care 01/29/2015   Tobacco abuse 05/30/2013   Rosacea 05/30/2013   Eczema 05/30/2013   Glaucoma 05/30/2013   Personal history of congenital hip dysplasia 05/30/2013   Discharged Condition: Stable  PCP is Brett [name],  Eleanor, NP Referring Provider is Brett Hose, MD    HPI: [name] is sent for consultation regarding an adenocarcinoma of the left lower lobe.   Brett [name] is a 68 year old former smoker with a past medical history significant for long-term tobacco use, COPD, paroxysmal SVT, nonsustained  ventricular tachycardia, congenital hip deformity, scoliosis, multiple hip and back surgeries, cervical disc disease, chronic pain, glaucoma, rosacea, and recently diagnosed lung cancer.   He had back surgery last fall.  During the procedure he was noted to have short episodes of paroxysmal SVT treated with esmolol .  He was referred to Dr. Liborio as an outpatient.  He had a workup including Zio patch and echocardiogram.  Echo showed ejection fraction of 50 to 55% with diastolic dysfunction.  That led to a stress PET.  The PET showed severe coronary calcification but no evidence of ischemia or malperfusion.  There was a new 5 mm left lower lobe lung nodule that had not been on the patient's previous screening scans.  He had another screening scan in April which showed the nodule had increased in size to 11 mm.  The nodule measured 16 x 9 mm and was hypermetabolic with an SUV of 6.2.  There was uptake in the hilar and mediastinal regions consistent with nodes but there were no enlarged lymph nodes in those areas.  Also some uptake along the left parotid gland.  There was an area of uptake along the right fifth rib and an area of sclerosis.  Also slight bit of uptake in the eighth rib.   Also was noted to have some gastric thickening on one of the CT is scheduled to have an upper endoscopy on 02/14/2024.   He denies any chest pain, pressure, or tightness.  He does have some shortness of breath.  He does have occasional wheezing which is better when he takes Claritin .  Also has a productive cough.  Complains of reflux and frequent heartburn.  Ambulates with a cane.  Has some weakness in the right leg.  No change in appetite or significant weight loss.  Quit smoking about 3 months ago with the help of Chantix .  Adenocarcinoma left lower lobe-clinical stage Ia (T1, N0) versus 2B, T1, N1).  Given the normal size of the lymph nodes I think we should get the benefit of the doubt on that.  Surgery would be part of  the treatment regimen in either case.  If the lymph nodes are positive would also need adjuvant chemotherapy.   Discussed treatment options including surgical resection versus radiation.  He understands the relative advantages and disadvantages of each approach.   I described the proposed operative procedure to Mr. Fomby and his daughter.  They understand the general nature of the procedure including the need for general anesthesia, the incisions to be used, the use of surgical robot, use of drains to postoperatively, the expected hospital stay, and the overall recovery.  I informed them of the indications, risks, benefits, and alternatives.  They understand the risks include, but not limited to death, MI, DVT, PE, bleeding, possibly for transfusion, infection, prolonged air leak, cardiac arrhythmias, pain issues, as well as possibility of other unforeseeable complications.   He accepts the risk and agrees to proceed.   Hospital Course: Mr. Clarin was admitted for elective surgery on 02/19/2024.  He was taken to the operating room where robotic assisted right lower lobectomy was carried out along with lymph node dissection and intercostal nerve block.  Following procedure, he was  recovered in the postanesthesia care unit and later transferred to [facility].  Postoperative hospital course:  The patient has remained hemodynamically stable.  Chest tube output on postop day #1 was moderate.  It has continued to be fairly significant over the next several days but is steadily slowing over time.  He was noted to have a large air leak and chest tube is to suction with the exception of ambulation.  This airleak was followed for several days and ultimately was determined the patient would benefit from a bronchial valves which was performed on postop day 5.  Post operatively he required BIPAP and required oxygen via HFNC.  He was transported to the ICU.  Also post operatively his chest tube was free  from air leak, improved aeration but did develop re-expansion edema.  Clinically his situation regarding airleak and pulmonary status showed significant improvement over time.  His chest tube was removed on postop day #8.  Renal function has remained stable and he has good urine output.  he did have a mild, likely reactive leukocytosis with white blood cell count 13.3.  It has normalized over time.  He has a very minor expected acute blood loss anemia which is being monitored clinically.  He is kept on Lovenox  for DVT prophylaxis.  Oxygen is being weaned per routine protocols.  He did develop postoperative atrial fibrillation initially treated with intravenous amiodarone  drip and subsequent transition to oral dosing.  He did have some recurrence of episodes and it was felt he should undergo anticoagulation therapy and Eliquis  was initiated.  He required several additional boluses of IV Amiodarone  to help convert to NSR.  He has developed a phlebitis/cellulitis at a previous IV site on his left forearm requiring initiation of intravenous Ancef  with subsequent transition to Augmentin .  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 - Separate focus of adenocarcinoma in situ, 1.9 cm. - Margins negative for carcinoma. - Eleven lymph nodes, all negative for carcinoma (0/11).   He has been weaned down to 2L of oxygen via [place] with good oxygenation. Left forearm (2 areas of phlebitis/cellulitis) proximal wound with swelling, no drainage, redness and more distal wound with mild redness. He will be continued on Augmentin  for a total of 5 days. He is stable for discharge today.  Consults: None  Significant Diagnostic Studies: nuclear medicine:   FINDINGS: Mediastinal blood pool activity: SUV max 2.1   Liver activity: SUV max 2.4   NECK: Focus of abnormal uptake with maximum SUV value of 6.9 corresponding to a focus deep to the left parotid gland.  Parotid primary lesion versus a abnormal node is possible. Focus on image 26 of series 4 measures 14 mm. No additional areas of abnormal uptake in the neck including along other lymph node chains such as submandibular, posterior triangle or internal jugular regions. Near symmetric uptake of tracer along the visualized intracranial compartment.   Incidental CT findings: Mild mucosal thickening along the maxillary sinuses, right greater than left. Also some mucosal thickening along the anterior ethmoid air cells and sphenoid sinuses. The mastoid air cells are clear. Streak artifact related to the patient's dental hardware. The right parotid gland, the submandibular glands and thyroid  gland are grossly unremarkable. Only mild heterogeneity of the thyroid  gland without abnormal uptake.   CHEST: No specific abnormal uptake seen above blood pool in the axillary region or hila. Slight asymmetric uptake identified along lymph node in the AP window with maximum SUV  of 3.8. This node is small on image 88 with short axis diameter 7 mm. However this node has been slowly increasing in size from exams going back to April 2024.   Additional area of uptake maximum SUV of 3.5 along a small node medially anterior to the top of the aortic arch as seen on image 76. This node is only 3 mm. Recommend follow up surveillance.   Left lower lobe juxtapleural nodule identified once again. On prior CT this measured 12 mm and today on image 107 this measures 16 x 9 mm. Uptake has maximum SUV of 6.2.   No additional areas of abnormal uptake along the lung parenchyma.   Incidental CT findings: Emphysematous lung changes identified diffusely. No pleural effusion or pneumothorax. Minimal ground-glass laterally along the left lower lobe. Prominent coronary artery calcifications are seen. Heart is not enlarged. No pericardial effusion. The thoracic aorta is normal course and caliber with scattered partially  calcified plaque. Right-sided apical pleural calcifications and thickening are identified.   ABDOMEN/PELVIS: There is physiologic distribution radiotracer along the parenchymal organs, bowel and renal collecting systems.   Incidental CT findings: Extensive vascular calcifications identified along the aorta and iliac vessels. Some branch vessel calcification as well. Grossly the liver, spleen, adrenal glands and pancreas are unremarkable. Gallbladder is nondilated. No abnormal calcifications seen within either kidney nor along the course of either ureter. Contracted urinary bladder. Moderate diffuse colonic stool. Left-sided colonic diverticula. The stomach and small bowel are nondilated. No obvious free air or free fluid.   SKELETON: There is a focus of abnormal uptake along the anterior right ribcage measuring 4.4 maximum SUV. There is some subtle sclerosis in this location along the anterior aspect of the right fifth rib on image 106. There is also some minimal uptake along the eighth rib margin. Nonspecific. Maximum SUV of 3.9.   There is some additional uptake noted in the lumbar spine at the disc space at L3-4 with there is a discs metallic spacer. There is extensive hardware along the lumbar spine from L3 through S1. Curvature of spine with prominent degenerative changes. Osteopenia right hip arthroplasty with streak artifact. Degenerative changes of the left hip.   IMPRESSION: Significant abnormal uptake along the left lower lobe nodule described on the prior CT scan worrisome for malignant lesion.   There are 2 mediastinal nodes which have some low-level uptake and are small but in particular the small node in the AP window has been increasing in size slowly going back to study of April 2024. Recommend attention on short follow-up.   Isolated hypermetabolic focus along the deep margin of the left parotid gland. This could be a parotid lesion versus a node. Recommend  dedicated imaging of the neck region such as CT or MRI.   Small focus of uptake along the anterior aspect the right fifth rib with a small area of sclerosis. This would have a broad differential. There is also slight uptake along the anterior aspect of the eighth rib. Aggressive process is possible.   Asymmetric uptake involving the metallic disc spacer at the L3-4 level of the spine. There is extensive degenerative changes along the lower lumbar spine and hardware fixation. Please correlate with any symptoms.     Electronically Signed   By: Ranell Bring M.D.   On: 12/22/2023 15:51  Treatments: surgery:   NAME: Shimabukuro, Brock S. MEDICAL RECORD NO: [id number] ACCOUNT NO: [id number] DATE OF BIRTH: 01/01/1956 FACILITY: [facility] LOCATION: [facility] PHYSICIAN: Elspeth BROCKS. Kerrin, MD  Operative Report    DATE OF PROCEDURE: 02/19/2024   PREOPERATIVE DIAGNOSIS: Adenocarcinoma, left lower lobe, clinical stage IA to IIB (T1, N0 versus T1, N1).   POSTOPERATIVE DIAGNOSIS: Adenocarcinoma, left lower lobe, clinical stage IA to IIB (T1, N0 versus T1, N1).   PROCEDURE: Robotic-assisted left lower lobectomy, lymph node dissection, and intercostal nerve blocks levels 3 through 10.   SURGEON: Elspeth BROCKS. Kerrin, MD.   ASSISTANTBETHA Laurel Becket.    PATHOLOGY. 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  - Separate focus of adenocarcinoma in situ, 1.9 cm.  - Margins negative for carcinoma.  - Eleven lymph nodes, all negative for carcinoma (0/11).   Discharge Exam: Blood pressure 107/60, pulse 65, temperature 97.9 F (36.6 C), temperature source Oral, resp. rate 18, height 6' 2 (1.88 m), weight 76.2 kg, SpO2 93%.  Cardiovascular: RRR Pulmonary: Clear to auscultation on the right and mildly coarse on left  Abdomen: Soft, non tender, bowel sounds present. Extremities: No lower extremity  edema. Wounds: Clean and dry.  No erythema or signs of infection.  Disposition: Discharge disposition: 01-Home or Self Care        Allergies as of 03/03/2024   No Known Allergies      Medication List     STOP taking these medications    Entresto  24-26 MG Generic drug: sacubitril-valsartan   Na Sulfate-K Sulfate-Mg Sulfate concentrate 17.5-3.13-1.6 GM/177ML Soln Commonly known as: SUPREP       TAKE these medications    amiodarone  200 MG tablet Commonly known as: PACERONE  Take 200 mg by mouth two times daily for 7 days; then take 200 mg by mouth daily thereafter   amoxicillin -clavulanate 875-125 MG tablet Commonly known as: AUGMENTIN  Take 1 tablet by mouth every 12 (twelve) hours.   apixaban  5 MG Tabs tablet Commonly known as: ELIQUIS  Take 1 tablet (5 mg total) by mouth 2 (two) times daily.   aspirin  EC 81 MG tablet Take 1 tablet (81 mg total) by mouth daily. Swallow whole.   calcium  carbonate 750 MG chewable tablet Commonly known as: TUMS EX Chew 1-2 tablets by mouth 2 (two) times daily as needed for heartburn.   empagliflozin  10 MG Tabs tablet Commonly known as: Jardiance  Take 1 tablet (10 mg total) by mouth daily before breakfast.   methocarbamol  500 MG tablet Commonly known as: ROBAXIN  Take 1 tablet (500 mg total) by mouth every 8 (eight) hours as needed for muscle spasms.   metoprolol  succinate 25 MG 24 hr tablet Commonly known as: Toprol  XL Take 1 tablet (25 mg total) by mouth daily.   multivitamin with minerals Tabs tablet Take 1 tablet by mouth in the morning.   Oxycodone  HCl 10 MG Tabs Take 1 tablet (10 mg total) by mouth every 6 (six) hours as needed for up to 7 days for severe pain (pain score 7-10). What changed: reasons to take this   Stelara 45 MG/0.5ML injection Generic drug: ustekinumab Inject 45 mg into the skin as directed. Every 12 weeks   Timolol  Maleate (Once-Daily) 0.5 % Soln Place 1 drop into both eyes in the morning and  at bedtime.   Travatan Z 0.004 % Soln ophthalmic solution Generic drug: Travoprost (BAK Free) Place 1 drop into both eyes every morning.   varenicline  1 MG tablet Commonly known as: Chantix  Continuing Month Pak Take 1 tablet (1 mg total) by mouth 2 (two) times daily.  Durable Medical Equipment  (From admission, onward)           Start     Ordered   02/29/24 1209  For home use only DME oxygen  Once       Question Answer Comment  Length of Need 6 Months   Mode or (Route) Nasal cannula   Liters per Minute 4   Frequency Continuous (stationary and portable oxygen unit needed)   Oxygen conserving device Yes   Oxygen delivery system Gas      02/29/24 1210   02/29/24 1209  For home use only DME Other see comment  Once       Comments: Portable oxygen concentrator evaluation 1-6L pulse dose. If qualifies, dispense.  Question:  Length of Need  Answer:  6 Months   02/29/24 1210   02/28/24 1102  For home use only DME Walker tall  Once       Question Answer Comment  Patient needs a walker to treat with the following condition S/P lobectomy of lung   Patient needs a walker to treat with the following condition Physical deconditioning      02/28/24 1102            Follow-up Information     Kerrin Elspeth BROCKS, MD Follow up on 03/05/2024.   Specialty: Cardiothoracic Surgery Why: Appointment is at 11:00, please get CXR 1 hour prior to your appointment on the second floor of our office building Contact information: [address] [place] [address] [phone]         [facility] Follow up.   Why: Call to schedule apt for portable concentrator qualification test Contact information: [address] [place] [address] [phone]                 Signed: Kyla CHRISTELLA Donald, PA-C  03/03/2024, 9:15 AM

## 2024-02-23 NOTE — Anesthesia Preprocedure Evaluation (Signed)
 Anesthesia Evaluation  Patient identified by MRN, date of birth, ID band Patient awake    Reviewed: Allergy & Precautions, H&P , NPO status , Patient's Chart, lab work & pertinent test results  History of Anesthesia Complications Negative for: history of anesthetic complications  Airway Mallampati: II   Neck ROM: full    Dental  (+) Teeth Intact   Pulmonary asthma , neg sleep apnea, neg COPD, Patient abstained from smoking.Not current smoker, former smoker S/p lobectomy/VATS with persistent air leak  In-situ chest tube   + decreased breath sounds      Cardiovascular Exercise Tolerance: Good METS(-) hypertension(-) CAD and (-) Past MI + dysrhythmias Atrial Fibrillation  Rhythm:regular Rate:Normal     Neuro/Psych negative neurological ROS  negative psych ROS   GI/Hepatic ,neg GERD  ,,(+)     (-) substance abuse    Endo/Other  neg diabetes    Renal/GU negative Renal ROS     Musculoskeletal  (+) Arthritis ,    Abdominal   Peds  Hematology   Anesthesia Other Findings Past Medical History: No date: Allergy No date: Arthritis No date: Asthma 09/22/2015: Bilateral low back pain without sciatica No date: Blood transfusion without reported diagnosis No date: Cancer [facility])     Comment:  Left Lower Lobe Adenocarcinoma No date: Cardiomyopathy (HCC) 09/22/2015: Cervical neck pain with evidence of disc disease No date: Chicken pox No date: Congenital hip deformity 08/09/2022: Corn of foot 01/29/2015: Degenerative disc disease, cervical No date: Dysrhythmia     Comment:  SVT 05/30/2013: Eczema 09/22/2015: Erectile dysfunction 05/30/2013: Glaucoma 05/23/2023: History of cardiac dysrhythmia 05/05/2020: Isthmic spondylolisthesis 05/09/2023: Lumbar adjacent segment disease with spondylolisthesis 09/22/2015: Need for shingles vaccine 11/2019: Neuromuscular disorder (HCC)     Comment:  sciatica No date: Nodule of left  lung 08/06/2023: Nonsustained ventricular tachycardia (HCC), identified on  event monitor November 2024, 1 episode 10 beats duration,  asymptomatic. 11/01/2018: Pain of left hip joint 06/19/2023: Paroxysmal SVT (supraventricular tachycardia) (HCC),  incidental finding IntraOp back surgery requiring esmolol ; event  monitor November 2024 for short runs, longest episode 17 beats.     Comment:  Event monitor November 2024, supraventricular ectopy               burden 1.7%.  4 short runs of SVT, longest episode 17               beats.  No symptoms reported   05/30/2013: Personal history of congenital hip dysplasia 01/29/2015: Preventative health care 09/22/2015: Rhinitis, allergic 05/30/2013: Rosacea 05/05/2020: Scoliosis deformity of spine 06/16/2023: Spinal stenosis of lumbar region 09/14/2020: Spondylolisthesis at L5-S1 level 05/30/2013: Tobacco abuse  Reproductive/Obstetrics                              Anesthesia Physical Anesthesia Plan  ASA: 3  Anesthesia Plan: General   Post-op Pain Management: Tylenol  PO (pre-op)*   Induction: Intravenous  PONV Risk Score and Plan: 2 and Ondansetron , Dexamethasone  and Treatment may vary due to age or medical condition  Airway Management Planned: Oral ETT  Additional Equipment: None  Intra-op Plan:   Post-operative Plan: Extubation in OR  Informed Consent: I have reviewed the patients History and Physical, chart, labs and discussed the procedure including the risks, benefits and alternatives for the proposed anesthesia with the patient or authorized representative who has indicated his/her understanding and acceptance.     Dental advisory given  Plan Discussed with: CRNA and Surgeon  Anesthesia Plan Comments: (Discussed risks of anesthesia with patient, including PONV, sore throat, lip/dental/eye damage. Rare risks discussed as well, such as cardiorespiratory and neurological sequelae, and allergic  reactions. Discussed the role of CRNA in patient's perioperative care. Patient understands.)         Anesthesia Quick Evaluation

## 2024-02-23 NOTE — Progress Notes (Signed)
 4 Days Post-Op Procedure(s) (LRB): LOBECTOMY, LUNG, ROBOT-ASSISTED, USING VATS (Left) BLOCK, NERVE, INTERCOSTAL (Left) BIOPSY, LYMPH NODE (Left) Subjective: Still with mod air leak  Objective: Vital signs in last 24 hours: Temp:  [97.7 F (36.5 C)-98.2 F (36.8 C)] 97.7 F (36.5 C) (07/18 0400) Pulse Rate:  [49-106] 49 (07/18 0400) Cardiac Rhythm: Sinus bradycardia (07/17 1905) Resp:  [15-20] 19 (07/17 2343) BP: (93-129)/(53-89) 104/63 (07/18 0400) SpO2:  [93 %-98 %] 98 % (07/18 0400)  Hemodynamic parameters for last 24 hours:    Intake/Output from previous day: 07/17 0701 - 07/18 0700 In: 650.3 [P.O.:600; I.V.:50.3] Out: 1785 [Urine:1175; Chest Tube:610] Intake/Output this shift: No intake/output data recorded.  General appearance: alert, cooperative, and no distress Heart: irregularly irregular rhythm Lungs: coarse Abdomen: benign Extremities: no edema or calf tenderness Wound: healing well  Lab Results: Recent Labs    02/21/24 0114 02/23/24 0219  WBC 11.0* 8.9  HGB 12.0* 12.4*  HCT 36.2* 37.3*  PLT 168 201   BMET:  Recent Labs    02/21/24 0114 02/23/24 0219  NA 133* 135  K 4.3 4.6  CL 102 102  CO2 22 25  GLUCOSE 125* 107*  BUN 20 13  CREATININE 1.01 0.88  CALCIUM  7.9* 8.2*    PT/INR: No results for input(s): LABPROT, INR in the last 72 hours. ABG No results found for: PHART, HCO3, TCO2, ACIDBASEDEF, O2SAT CBG (last 3)  No results for input(s): GLUCAP in the last 72 hours.  Meds Scheduled Meds:  acetaminophen   1,000 mg Oral Q6H   Or   acetaminophen  (TYLENOL ) oral liquid 160 mg/5 mL  1,000 mg Oral Q6H   amiodarone   400 mg Oral BID   bisacodyl   10 mg Oral Daily   Chlorhexidine  Gluconate Cloth  6 each Topical Daily   enoxaparin  (LOVENOX ) injection  40 mg Subcutaneous Daily   feeding supplement  1 Container Oral TID BM   gabapentin   300 mg Oral BID   latanoprost   1 drop Both Eyes q morning   metoprolol  succinate  25 mg Oral  Daily   pantoprazole   40 mg Oral Daily   senna-docusate  1 tablet Oral QHS   timolol   1 drop Both Eyes BID   Continuous Infusions: PRN Meds:.levalbuterol , methocarbamol , ondansetron  (ZOFRAN ) IV, oxyCODONE , sorbitol , traMADol   Xrays DG Chest 1 View Result Date: 02/22/2024 EXAM: 1 VIEW XRAY OF THE CHEST 02/22/2024 06:47:00 AM COMPARISON: 02/21/2024 CLINICAL HISTORY: Status post lobectomy of lung. FINDINGS: LUNGS AND PLEURA: Tiny left apical pneumothorax, estimated at 5% with visceral pleural line 7 mm from chest wall. Left greater than right base airspace disease, similar on the right and mildly increased on the left. HEART AND MEDIASTINUM: No acute abnormality of the cardiac and mediastinal silhouettes. BONES AND SOFT TISSUES: No acute osseous abnormality. Extensive subcutaneous emphysema about the left greater than right chest, minimally decreased. IMPRESSION: 1. Tiny left apical pneumothorax, estimated at 5%, minimally decreased. 2. Left greater than right base airspace disease, mildly increased on the left. Most likely atelectasis. 3. Extensive subcutaneous emphysema, left greater than right, minimally decreased. Electronically signed by: Rockey Kilts MD 02/22/2024 10:54 AM EDT RP Workstation: [id number]    Assessment/Plan: S/P Procedure(s) (LRB): LOBECTOMY, LUNG, ROBOT-ASSISTED, USING VATS (Left) BLOCK, NERVE, INTERCOSTAL (Left) BIOPSY, LYMPH NODE (Left)   1 afeb, VSS s BP 90's-120's, sinus rhythm/brady-just went back into afib w/ RVR on po amio for post op afib- will give bolus, starting eliquis tonight 2 sats good on 3 liters 3 CT 610 ml/24h- +  mod air leak persists, serosang 4 good UOP 5 + BM 6 normal renal fxn 7 leukocytosis resolved 8 H/H remains stable, minor expected ABLA 9 for bronchial  valves today    LOS: 4 days    Lemond FORBES Cera PA-C Pager [phone] 02/23/2024

## 2024-02-24 ENCOUNTER — Encounter (HOSPITAL_COMMUNITY): Payer: Self-pay | Admitting: Thoracic Surgery (Cardiothoracic Vascular Surgery)

## 2024-02-24 ENCOUNTER — Inpatient Hospital Stay (HOSPITAL_COMMUNITY)

## 2024-02-24 DIAGNOSIS — I480 Paroxysmal atrial fibrillation: Secondary | ICD-10-CM

## 2024-02-24 DIAGNOSIS — I5032 Chronic diastolic (congestive) heart failure: Secondary | ICD-10-CM

## 2024-02-24 DIAGNOSIS — I10 Essential (primary) hypertension: Secondary | ICD-10-CM

## 2024-02-24 DIAGNOSIS — C3432 Malignant neoplasm of lower lobe, left bronchus or lung: Secondary | ICD-10-CM

## 2024-02-24 DIAGNOSIS — F17210 Nicotine dependence, cigarettes, uncomplicated: Secondary | ICD-10-CM

## 2024-02-24 DIAGNOSIS — J939 Pneumothorax, unspecified: Secondary | ICD-10-CM | POA: Diagnosis not present

## 2024-02-24 DIAGNOSIS — E871 Hypo-osmolality and hyponatremia: Secondary | ICD-10-CM

## 2024-02-24 DIAGNOSIS — J9601 Acute respiratory failure with hypoxia: Secondary | ICD-10-CM | POA: Diagnosis not present

## 2024-02-24 DIAGNOSIS — Z902 Acquired absence of lung [part of]: Secondary | ICD-10-CM | POA: Diagnosis not present

## 2024-02-24 MED ORDER — SODIUM CHLORIDE 0.9 % IV SOLN: INTRAVENOUS | Status: DC

## 2024-02-24 NOTE — Progress Notes (Signed)
      [address]       [place] [address]             [phone]                 1 Day Post-Op Procedure(s) (LRB): VIDEO BRONCHOSCOPY WITHOUT FLUORO (N/A) BRONCHOSCOPY, FLEXIBLE, WITH INTRABRONCHIAL VALVE INSERTION (N/A)   Events: No events Off bipap _______________________________________________________________ Vitals: BP (!) 101/53   Pulse 68   Temp 97.9 F (36.6 C) (Axillary)   Resp 13   Ht 6' 2 (1.88 m)   Wt 72.6 kg   SpO2 100%   BMI 20.54 kg/m  Filed Weights   02/19/24 0550  Weight: 72.6 kg     - Neuro: alert NAD  - Cardiovascular: sinus  Drips: amio.      - Pulm: EWOB.  No leak Vent Mode: BIPAP;PSV FiO2 (%):  [40 %-60 %] 40 %  ABG No results found for: PHART, PCO2ART, PO2ART, HCO3, TCO2, ACIDBASEDEF, O2SAT  - Abd: ND - Extremity: warm  .Intake/Output      07/18 0701 07/19 0700 07/19 0701 07/20 0700   P.O.     I.V. (mL/kg) 1460.2 (20.1)    Total Intake(mL/kg) 1460.2 (20.1)    Urine (mL/kg/hr) 1775 (1)    Stool     Chest Tube 645 60   Total Output 2420 60   Net -959.8 -60           _______________________________________________________________ Labs:    Latest Ref Rng & Units 02/23/2024    2:19 AM 02/21/2024    1:14 AM 02/20/2024    6:19 AM  CBC  WBC 4.0 - 10.5 K/uL 8.9  11.0  13.3   Hemoglobin 13.0 - 17.0 g/dL 87.5  87.9  87.5   Hematocrit 39.0 - 52.0 % 37.3  36.2  36.4   Platelets 150 - 400 K/uL 201  168  172       Latest Ref Rng & Units 02/23/2024    2:19 AM 02/21/2024    1:14 AM 02/20/2024    6:19 AM  CMP  Glucose 70 - 99 mg/dL 892  874  801   BUN 8 - 23 mg/dL 13  20  18    Creatinine 0.61 - 1.24 mg/dL 9.11  8.98  9.09   Sodium 135 - 145 mmol/L 135  133  135   Potassium 3.5 - 5.1 mmol/L 4.6  4.3  4.2   Chloride 98 - 111 mmol/L 102  102  102   CO2 22 - 32 mmol/L 25  22  23    Calcium  8.9 - 10.3 mg/dL 8.2  7.9  8.3   Total Protein 6.5 - 8.1 g/dL  5.4    Total Bilirubin 0.0 - 1.2 mg/dL  0.4     Alkaline Phos 38 - 126 U/L  41    AST 15 - 41 U/L  34    ALT 0 - 44 U/L  23      CXR: No pneumoT.  Lung expanded  _______________________________________________________________  Assessment and Plan: s/p LLLectomy, with persistent air leak  Neuro: pain controlled CV: on amio for afib.  Back in sinus.  Will resume eliquis  Pulm: IS.  Will keep CT to suction.  Wean O2 Renal: creat stable GI: on diet Heme: stable ID: [email or web address] Endo: SSI Dispo: continue ICU care   Buena Boehm O Niccole Witthuhn 02/24/2024 9:05 AM

## 2024-02-24 NOTE — Anesthesia Postprocedure Evaluation (Addendum)
 Anesthesia Post Note  Patient: KIVON APREA  Procedure(s) Performed: VIDEO BRONCHOSCOPY WITHOUT FLUORO BRONCHOSCOPY, FLEXIBLE, WITH INTRABRONCHIAL VALVE INSERTION     Patient location during evaluation: PACU Anesthesia Type: General Level of consciousness: awake and alert Pain management: pain level controlled Vital Signs Assessment: post-procedure vital signs reviewed and stable Respiratory status: spontaneous breathing (BiPAP) Cardiovascular status: blood pressure returned to baseline and stable Postop Assessment: no apparent nausea or vomiting Anesthetic complications: no Comments: Requiring ICU admission for respiratory status that has declined since pre-op.   There were no known notable events for this encounter.  Last Vitals:    Last Pain:   Pain Goal:                  Garnette FORBES [place]illern

## 2024-02-24 NOTE — Progress Notes (Addendum)
 NAME:  Brett [name], MRN:  [id number], DOB:  [birth year], LOS: 5 ADMISSION DATE:  02/19/2024, CONSULTATION DATE:  7/18 REFERRING MD:  hendrickson, CHIEF COMPLAINT:  acute resp failure   History of Present Illness:  68 year old male patient with a history of tobacco abuse, COPD, paroxysmal atrial fibrillation and SVT, multiple orthopedic surgeries, and recent diagnosis with adenocarcinoma stage Ia-IIb for which she underwent a left video-assisted thoracotomy with lobectomy initially completed on 7/15 His postoperative course was complicated by an ongoing bronchopleural fistula with continuous airleak noted by chest tube, he was brought to the operating room once again on 7/18 at which time he he underwent placement of 2 endobronchial valves.  Postoperatively the patient was extubated, pulse oximetry was 80% on nonrebreather, chest x-ray showed complete opacification of the left hemithorax.  He was subsequently reintubated, and bronchoscopically the IPV's were removed.  Extubated once again in the recovery room, requiring nonrebreather, with marked work of breathing and atrial fibrillation with RVR.  Received IV Lasix , portable chest x-ray showed improvement of aeration on the left hemithorax with diffuse subcutaneous air, but what appeared to be reexpansion pulmonary edema of note the chest tube did not demonstrate air leak.  Given his underlying pulmonary status, and our involvement preoperatively pulmonary team was asked to assist with his postoperative management Given his marked increased work of breathing we opted to place him on noninvasive positive pressure ventilation knowing he still had his left chest tube in place.  The decision was made to recover him in the intensive care Pertinent  Medical History  Tobacco abuse, Copd, Afib, LLL adenocarcinoma, left VATS w/ robotic left lower lobectomy, congenital hip deformity, cervical disc disease.  Ventricular tachycardia, glaucoma,  rosacea   Significant Hospital Events: Including procedures, antibiotic start and stop dates in addition to other pertinent events   7/15 left video-assisted thoracotomy with left lower lobe lobectomy, for stage Ia/IIb adenocarcinoma 7/15 through 7/17 ongoing BPF involving the left hemithorax with ongoing hypoxia postoperatively 7/18 brought to the OR where he underwent left endobronchial valve placement x 2, postoperatively hypoxic with complete collapse of the left hemithorax necessitating emergent removal of valves.  Postprocedural chest x-ray showing improved aeration with reexpansion edema, continued to have increased respiratory effort necessitating noninvasive positive pressure ventilation  Interim History / Subjective:  Came off BiPAP this morning Oxygen is being titrated down, currently on 3 L with O2 sat in mid 90s, stated feeling better  Objective    Blood pressure (!) 83/70, pulse 92, temperature 97.9 F (36.6 C), temperature source Axillary, resp. rate 11, height 6' 2 (1.88 m), weight 72.6 kg, SpO2 100%.    Vent Mode: BIPAP;PSV FiO2 (%):  [40 %-60 %] 40 %   Intake/Output Summary (Last 24 hours) at 02/24/2024 1011 Last data filed at 02/24/2024 1000 Gross per 24 hour  Intake 1580.2 ml  Output 2805 ml  Net -1224.8 ml   Filed Weights   02/19/24 0550  Weight: 72.6 kg    Examination: General: Elderly male, lying on the bed HEENT: [place]/AT, eyes anicteric.  moist mucus membranes Neuro: Alert, awake following commands Chest: Palpable crepitus noted on left side, coarse breath sounds, no wheezes or rhonchi.  Left-sided chest tube in place, no airleak noted Heart: Irregularly irregular, tachycardic, no murmurs or gallops Abdomen: Soft, nontender, nondistended, bowel sounds present  Labs and images reviewed  Patient Lines/Drains/Airways Status     Active Line/Drains/Airways     Name Placement date Placement time Site Days   Peripheral IV  02/19/24 16 G 1 Left Forearm  02/19/24  0720  Forearm  5   Peripheral IV 02/23/24 20 G Left Antecubital 02/23/24  1753  Antecubital  1   Chest Tube 1 Left;Lateral Pleural 28 Fr. 02/19/24  1021  Pleural  5   Wound 02/19/24 0921 Surgical Closed Surgical Incision Chest Left 02/19/24  0921  Chest  5        Resolved problem list   Assessment and Plan  Acute hypoxic respiratory failure likely secondary to reexpansion pulmonary postop bronchial valves placement, subsequently removed Left-sided pneumothorax status post chest tube placement Left-sided subcutaneous emphysema Patient came off of BiPAP Currently on 3 L high flow nasal cannula oxygen, titrate with O2 sat goal 92% Encourage incentive spirometry Continue chest tube on suction, no airleak noted Received Lasix  yesterday, hold off for now  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 status post left video-assisted thoracotomy with lower lobe lobectomy complicated by bronchopleural fistula Attempted endobronchial valve valves, however had to be retrieved due to left hemithorax collapse and probable reexpansion pulmonary edema Currently no airleak on chest tube TCTS following Outpatient follow-up with oncology  COPD without exacerbation Tobacco dependence Continue triple therapy bronchodilators with Brovana , budesonide , and Yupelri  Smoking cessation counseling provided  Paroxysmal atrial fibrillation with RVR Hypertension Chronic HFpEF  Patient is going in and out of A-fib, rate is not controlled Continue amiodarone  Continue Eliquis  for stroke prophylaxis Monitor and supplement electrolytes Monitor intake and output GDMT as tolerated  Hyponatremia, improved  glaucoma Continue postoperative eyedrops  Best Practice (right click and Reselect all SmartList Selections daily)   Diet/type: Regular diet DVT prophylaxis: Eliquis  Pressure ulcers present: Please refer to nursing notes GI prophylaxis: PPI Lines: NA Foley:  N/A Code Status:  full  code Last date of multidisciplinary goals of care discussion [Per primary team]    Valinda Novas, MD [facility] See Amion for pager If no response to pager, please call [phone] until 7pm After 7pm, Please call E-link [phone]

## 2024-02-25 ENCOUNTER — Inpatient Hospital Stay (HOSPITAL_COMMUNITY)

## 2024-02-25 DIAGNOSIS — J9601 Acute respiratory failure with hypoxia: Secondary | ICD-10-CM | POA: Diagnosis not present

## 2024-02-25 DIAGNOSIS — Z902 Acquired absence of lung [part of]: Secondary | ICD-10-CM | POA: Diagnosis not present

## 2024-02-25 DIAGNOSIS — C3432 Malignant neoplasm of lower lobe, left bronchus or lung: Secondary | ICD-10-CM | POA: Diagnosis not present

## 2024-02-25 DIAGNOSIS — J939 Pneumothorax, unspecified: Secondary | ICD-10-CM | POA: Diagnosis not present

## 2024-02-25 LAB — BASIC METABOLIC PANEL WITH GFR
Anion gap: 8 (ref 5–15)
BUN: 15 mg/dL (ref 8–23)
CO2: 25 mmol/L (ref 22–32)
Calcium: 8.4 mg/dL — ABNORMAL LOW (ref 8.9–10.3)
Chloride: 102 mmol/L (ref 98–111)
Creatinine, Ser: 0.63 mg/dL (ref 0.61–1.24)
GFR, Estimated: >60 mL/min (ref >60)
Glucose, Bld: 99 mg/dL (ref 70–99)
Potassium: 5.1 mmol/L (ref 3.5–5.1)
Sodium: 135 mmol/L (ref 135–145)

## 2024-02-25 LAB — CBC
HCT: 37.5 % — ABNORMAL LOW (ref 39.0–52.0)
Hemoglobin: 12.5 g/dL — ABNORMAL LOW (ref 13.0–17.0)
MCH: 33.9 pg (ref 26.0–34.0)
MCHC: 33.3 g/dL (ref 30.0–36.0)
MCV: 101.6 fL — ABNORMAL HIGH (ref 80.0–100.0)
Platelets: 134 K/uL — ABNORMAL LOW (ref 150–400)
RBC: 3.69 MIL/uL — ABNORMAL LOW (ref 4.22–5.81)
RDW: 12.1 % (ref 11.5–15.5)
WBC: 8.7 K/uL (ref 4.0–10.5)
nRBC: 0 % (ref 0.0–0.2)

## 2024-02-25 LAB — MAGNESIUM: Magnesium: 2.1 mg/dL (ref 1.7–2.4)

## 2024-02-25 MED ORDER — HYALURONIDASE HUMAN 150 UNIT/ML IJ SOLN
150.0000 [IU] | Freq: Once | INTRAMUSCULAR | Status: AC
  Administered 2024-02-25: 150 [IU] via SUBCUTANEOUS
  Filled 2024-02-25: qty 1

## 2024-02-25 MED ORDER — AMIODARONE IV BOLUS ONLY 150 MG/100ML
150.0000 mg | Freq: Once | INTRAVENOUS | Status: AC
  Administered 2024-02-25: 150 mg via INTRAVENOUS

## 2024-02-25 MED ORDER — ORAL CARE MOUTH RINSE: 15.0000 mL | OROMUCOSAL | Status: DC | PRN

## 2024-02-25 NOTE — Progress Notes (Signed)
 EVENING ROUNDS NOTE :     [address]       [name] [address]             [phone]                 2 Days Post-Op Procedure(s) (LRB): VIDEO BRONCHOSCOPY WITHOUT FLUORO (N/A) BRONCHOSCOPY, FLEXIBLE, WITH INTRABRONCHIAL VALVE INSERTION (N/A)   Total Length of Stay:  LOS: 6 days  Events:   Back in sinus Continue CT management    BP 103/61   Pulse 64   Temp 98.6 F (37 C) (Oral)   Resp 16   Ht 6' 2 (1.88 m)   Wt 72.6 kg   SpO2 91%   BMI 20.54 kg/m          amiodarone  30 mg/hr (02/25/24 1500)    I/O last 3 completed shifts: In: 1774.9 [P.O.:360; I.V.:1414.9] Out: 2650 [Urine:2175; Chest Tube:475]      Latest Ref Rng & Units 02/25/2024    6:51 AM 02/23/2024    2:19 AM 02/21/2024    1:14 AM  CBC  WBC 4.0 - 10.5 K/uL 8.7  8.9  11.0   Hemoglobin 13.0 - 17.0 g/dL 87.4  87.5  87.9   Hematocrit 39.0 - 52.0 % 37.5  37.3  36.2   Platelets 150 - 400 K/uL 134  201  168        Latest Ref Rng & Units 02/25/2024    6:51 AM 02/23/2024    2:19 AM 02/21/2024    1:14 AM  BMP  Glucose 70 - 99 mg/dL 99  892  874   BUN 8 - 23 mg/dL 15  13  20    Creatinine 0.61 - 1.24 mg/dL 9.36  9.11  8.98   Sodium 135 - 145 mmol/L 135  135  133   Potassium 3.5 - 5.1 mmol/L 5.1  4.6  4.3   Chloride 98 - 111 mmol/L 102  102  102   CO2 22 - 32 mmol/L 25  25  22    Calcium  8.9 - 10.3 mg/dL 8.4  8.2  7.9     ABG No results found for: PHART, PCO2ART, PO2ART, HCO3, TCO2, ACIDBASEDEF, O2SAT     Linnie Rayas, MD 02/25/2024 3:23 PM

## 2024-02-25 NOTE — Progress Notes (Signed)
 NAME:  Brett [name], MRN:  [id number], DOB:  14-Jul-1956, LOS: 6 ADMISSION DATE:  02/19/2024, CONSULTATION DATE:  7/18 REFERRING MD:  hendrickson, CHIEF COMPLAINT:  acute resp failure   History of Present Illness:  68 year old male patient with a history of tobacco abuse, COPD, paroxysmal atrial fibrillation and SVT, multiple orthopedic surgeries, and recent diagnosis with adenocarcinoma stage Ia-IIb for which she underwent a left video-assisted thoracotomy with lobectomy initially completed on 7/15 His postoperative course was complicated by an ongoing bronchopleural fistula with continuous airleak noted by chest tube, he was brought to the operating room once again on 7/18 at which time he he underwent placement of 2 endobronchial valves.  Postoperatively the patient was extubated, pulse oximetry was 80% on nonrebreather, chest x-ray showed complete opacification of the left hemithorax.  He was subsequently reintubated, and bronchoscopically the IPV's were removed.  Extubated once again in the recovery room, requiring nonrebreather, with marked work of breathing and atrial fibrillation with RVR.  Received IV Lasix , portable chest x-ray showed improvement of aeration on the left hemithorax with diffuse subcutaneous air, but what appeared to be reexpansion pulmonary edema of note the chest tube did not demonstrate air leak.  Given his underlying pulmonary status, and our involvement preoperatively pulmonary team was asked to assist with his postoperative management Given his marked increased work of breathing we opted to place him on noninvasive positive pressure ventilation knowing he still had his left chest tube in place.  The decision was made to recover him in the intensive care Pertinent  Medical History  Tobacco abuse, Copd, Afib, LLL adenocarcinoma, left VATS w/ robotic left lower lobectomy, congenital hip deformity, cervical disc disease.  Ventricular tachycardia, glaucoma,  rosacea   Significant Hospital Events: Including procedures, antibiotic start and stop dates in addition to other pertinent events   7/15 left video-assisted thoracotomy with left lower lobe lobectomy, for stage Ia/IIb adenocarcinoma 7/15 through 7/17 ongoing BPF involving the left hemithorax with ongoing hypoxia postoperatively 7/18 brought to the OR where he underwent left endobronchial valve placement x 2, postoperatively hypoxic with complete collapse of the left hemithorax necessitating emergent removal of valves.  Postprocedural chest x-ray showing improved aeration with reexpansion edema, continued to have increased respiratory effort necessitating noninvasive positive pressure ventilation 7/19 came off of BiPAP on nasal cannula oxygen, no air leak noted  Interim History / Subjective:  Patient is on room air now Went into A-fib with RVR, received amiodarone  bolus, currently on amiodarone  infusion, with that converted to sinus rhythm Chest tube is placed on waterseal  Objective    Blood pressure 109/64, pulse 67, temperature 98.6 F (37 C), temperature source Oral, resp. rate 19, height 6' 2 (1.88 m), weight 72.6 kg, SpO2 96%.        Intake/Output Summary (Last 24 hours) at 02/25/2024 1106 Last data filed at 02/25/2024 1000 Gross per 24 hour  Intake 573.17 ml  Output 1195 ml  Net -621.83 ml   Filed Weights   02/19/24 0550  Weight: 72.6 kg    Examination: General: Elderly male, lying on the bed HEENT: [place]/AT, eyes anicteric.  moist mucus membranes Neuro: Alert, awake following commands Chest: Left-sided subcutaneous emphysema noted, coarse breath sounds, no wheezes or rhonchi.  Left-sided chest tube in place, under waterseal Heart: Regular rate and rhythm, no murmurs or gallops Abdomen: Soft, nontender, nondistended, bowel sounds present  Labs and images reviewed  Patient Lines/Drains/Airways Status     Active Line/Drains/Airways     Name Placement date  Placement  time Site Days   Peripheral IV 02/23/24 20 G Left Antecubital 02/23/24  1753  Antecubital  2   Chest Tube 1 Left;Lateral Pleural 28 Fr. 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 status post left video-assisted thoracotomy with lower lobe lobectomy complicated by bronchopleural fistula Attempted endobronchial valve valves, however had to be retrieved due to left hemithorax collapse and probable reexpansion pulmonary edema Currently no airleak on chest tube Outpatient follow-up with oncology  COPD without exacerbation Tobacco dependence Continue triple therapy bronchodilators with Brovana , budesonide , and Yupelri  Smoking cessation counseling provided  Paroxysmal atrial fibrillation with RVR Hypertension Chronic HFpEF  Patient went into A-fib with RVR, received amiodarone  bolus with conversion to sinus rhythm Continue amiodarone  infusion Continue Eliquis  for stroke prophylaxis Monitor and supplement electrolytes Monitor intake and output GDMT as tolerated  glaucoma Continue postoperative eyedrops  Best Practice (right click and Reselect all SmartList Selections daily)   Diet/type: Regular diet DVT prophylaxis: Eliquis  Pressure ulcers present: Please refer to nursing notes GI prophylaxis: PPI Lines: NA Foley:  N/A Code Status:  full code Last date of multidisciplinary goals of care discussion [Per primary team]    Valinda Novas,  MD [facility] See Amion for pager If no response to pager, please call [phone] until 7pm After 7pm, Please call E-link [phone]

## 2024-02-25 NOTE — Plan of Care (Signed)

## 2024-02-25 NOTE — Progress Notes (Signed)
      [address]       [name] [address]             [phone]                 2 Days Post-Op Procedure(s) (LRB): VIDEO BRONCHOSCOPY WITHOUT FLUORO (N/A) BRONCHOSCOPY, FLEXIBLE, WITH INTRABRONCHIAL VALVE INSERTION (N/A)   Events: No events Back in afib  _______________________________________________________________ Vitals: BP 93/63   Pulse 91   Temp 98.6 F (37 C) (Oral)   Resp 16   Ht 6' 2 (1.88 m)   Wt 72.6 kg   SpO2 99%   BMI 20.54 kg/m  Filed Weights   02/19/24 0550  Weight: 72.6 kg     - Neuro: alert NAD  - Cardiovascular: afib, 120-130s  Drips: amio.      - Pulm: EWOB.  Small leak    ABG No results found for: PHART, PCO2ART, PO2ART, HCO3, TCO2, ACIDBASEDEF, O2SAT  - Abd: ND - Extremity: warm  .Intake/Output      07/19 0701 07/20 0700 07/20 0701 07/21 0700   P.O. 360    I.V. (mL/kg) 166.7 (2.3)    Total Intake(mL/kg) 526.7 (7.3)    Urine (mL/kg/hr) 1050 (0.6)    Stool 0    Chest Tube 380    Total Output 1430    Net -903.3         Urine Occurrence 1 x    Stool Occurrence 1 x       _______________________________________________________________ Labs:    Latest Ref Rng & Units 02/23/2024    2:19 AM 02/21/2024    1:14 AM 02/20/2024    6:19 AM  CBC  WBC 4.0 - 10.5 K/uL 8.9  11.0  13.3   Hemoglobin 13.0 - 17.0 g/dL 87.5  87.9  87.5   Hematocrit 39.0 - 52.0 % 37.3  36.2  36.4   Platelets 150 - 400 K/uL 201  168  172       Latest Ref Rng & Units 02/23/2024    2:19 AM 02/21/2024    1:14 AM 02/20/2024    6:19 AM  CMP  Glucose 70 - 99 mg/dL 892  874  801   BUN 8 - 23 mg/dL 13  20  18    Creatinine 0.61 - 1.24 mg/dL 9.11  8.98  9.09   Sodium 135 - 145 mmol/L 135  133  135   Potassium 3.5 - 5.1 mmol/L 4.6  4.3  4.2   Chloride 98 - 111 mmol/L 102  102  102   CO2 22 - 32 mmol/L 25  22  23    Calcium  8.9 - 10.3 mg/dL 8.2  7.9  8.3   Total Protein 6.5 - 8.1 g/dL  5.4    Total Bilirubin 0.0 - 1.2 mg/dL   0.4    Alkaline Phos 38 - 126 U/L  41    AST 15 - 41 U/L  34    ALT 0 - 44 U/L  23      CXR: stable  _______________________________________________________________  Assessment and Plan: s/p LLLectomy, with persistent air leak  Neuro: pain controlled CV: on amio for afib.  Back in sinus.  Will resume eliquis .  Will rebolus amio Pulm: IS. CT to waterseal Renal: creat stable GI: on diet Heme: stable ID: [email or web address] Endo: SSI Dispo: continue ICU care for afib   Aymen Widrig O Cliff Damiani 02/25/2024 8:03 AM

## 2024-02-26 ENCOUNTER — Inpatient Hospital Stay (HOSPITAL_COMMUNITY)

## 2024-02-26 DIAGNOSIS — J9601 Acute respiratory failure with hypoxia: Secondary | ICD-10-CM | POA: Diagnosis not present

## 2024-02-26 DIAGNOSIS — J939 Pneumothorax, unspecified: Secondary | ICD-10-CM | POA: Diagnosis not present

## 2024-02-26 DIAGNOSIS — C3432 Malignant neoplasm of lower lobe, left bronchus or lung: Secondary | ICD-10-CM | POA: Diagnosis not present

## 2024-02-26 DIAGNOSIS — Z902 Acquired absence of lung [part of]: Secondary | ICD-10-CM | POA: Diagnosis not present

## 2024-02-26 LAB — MAGNESIUM: Magnesium: 2.1 mg/dL (ref 1.7–2.4)

## 2024-02-26 MED ORDER — HYALURONIDASE HUMAN 150 UNIT/ML IJ SOLN
150.0000 [IU] | Freq: Once | INTRAMUSCULAR | Status: AC
  Administered 2024-02-26: 150 [IU] via SUBCUTANEOUS
  Filled 2024-02-26: qty 1

## 2024-02-26 MED ORDER — AMIODARONE HCL 200 MG PO TABS
400.0000 mg | ORAL_TABLET | Freq: Two times a day (BID) | ORAL | Status: DC
  Administered 2024-02-26 – 2024-03-03 (×13): 400 mg via ORAL
  Filled 2024-02-26 (×13): qty 2

## 2024-02-26 MED ORDER — ONDANSETRON HCL 4 MG/2ML IJ SOLN
INTRAMUSCULAR | Status: AC
  Filled 2024-02-26: qty 2

## 2024-02-26 NOTE — Plan of Care (Signed)

## 2024-02-26 NOTE — Evaluation (Addendum)
 Physical Therapy Evaluation Patient Details Name: Brett [name] MRN: [id number] DOB: January 23, 1956 Today's Date: 02/26/2024  History of Present Illness  68 yo male admitted 02/19/24 for partial lobectomy of LLL. 7/18 post op air leak with bronchoscopy and endobronchial valve placement. PMHx: XLIF L3-4, arthritis, congenital hip deformity, glaucoma, neuromuscular disorder, CM, lung CA, COPD  Clinical Impression  Pt pleasant and reports lives with daughter who does not work but he has to help for finances and IADLS. Pt with limited gait and activity tolerance due to SOB with requirement up to 6L for activity due to desaturation and back to 2L at rest at 92%. Pt with decreased strength, balance, ambulation and function who will benefit from acute therapy to maximize mobility and safety with HHPT recommended.   HR 68-75 92% on 2L at rest        If plan is discharge home, recommend the following: A little help with walking and/or transfers;A little help with bathing/dressing/bathroom;Assistance with cooking/housework;Assist for transportation   Can travel by private vehicle        Equipment Recommendations None recommended by PT  Recommendations for Other Services  OT consult    Functional Status Assessment Patient has had a recent decline in their functional status and demonstrates the ability to make significant improvements in function in a reasonable and predictable amount of time.     Precautions / Restrictions Precautions Precautions: Fall;Other (comment) Recall of Precautions/Restrictions: Impaired Precaution/Restrictions Comments: watch sats, chest tube      Mobility  Bed Mobility               General bed mobility comments: in chair on arrival and end of session    Transfers Overall transfer level: Needs assistance   Transfers: Sit to/from Stand Sit to Stand: Contact guard assist           General transfer comment: cues for hand placement and safety     Ambulation/Gait Ambulation/Gait assistance: Contact guard assist Gait Distance (Feet): 150 Feet Assistive device: Rolling walker (2 wheels) Gait Pattern/deviations: Step-through pattern, Decreased stride length, Trunk flexed, Narrow base of support   Gait velocity interpretation: <1.8 ft/sec, indicate of risk for recurrent falls   General Gait Details: pt frequently reaching for hand rail despite presence of RW with cues for safety, posture, breathing technique and grossly 6 brief standing rests with O2 increased from 2>6L with desaturation to 88% on 6L with progressive distance limited by fatigue. Cues for direction to room  Stairs            Wheelchair Mobility     Tilt Bed    Modified Rankin (Stroke Patients Only)       Balance Overall balance assessment: Needs assistance   Sitting balance-Leahy Scale: Fair     Standing balance support: Bilateral upper extremity supported, Reliant on assistive device for balance, During functional activity Standing balance-Leahy Scale: Poor Standing balance comment: Rw in standing                             Pertinent Vitals/Pain Pain Assessment Pain Assessment: No/denies pain    Home Living Family/patient expects to be discharged to:: Private residence Living Arrangements: Children Available Help at Discharge: Family;Available 24 hours/day Type of Home: House Home Access: Stairs to enter   [employer] of Steps: 1   Home Layout: One level Home Equipment: Educational psychologist (4 wheels);Grab bars - tub/shower;Hand held Programmer, systems (2 wheels);Cane - single  point      Prior Function Prior Level of Function : Independent/Modified Independent               ADLs Comments: mod I, Drives, assists daughter     Extremity/Trunk Assessment   Upper Extremity Assessment Upper Extremity Assessment: Generalized weakness    Lower Extremity Assessment Lower Extremity Assessment:  Generalized weakness;RLE deficits/detail RLE Deficits / Details: hip flexion limited to 90 degrees, decreased strength- baseline    Cervical / Trunk Assessment Cervical / Trunk Assessment: Kyphotic  Communication   Communication Communication: No apparent difficulties    Cognition Arousal: Alert Behavior During Therapy: WFL for tasks assessed/performed   PT - Cognitive impairments: No apparent impairments                         Following commands: Intact       Cueing Cueing Techniques: Verbal cues     General Comments      Exercises General Exercises - Lower Extremity Long Arc Quad: AROM, Both, 10 reps, Seated Hip Flexion/Marching: AROM, Left, 10 reps, Seated   Assessment/Plan    PT Assessment Patient needs continued PT services  PT Problem List Decreased strength;Decreased activity tolerance;Decreased balance;Decreased mobility;Cardiopulmonary status limiting activity       PT Treatment Interventions DME instruction;Gait training;Functional mobility training;Therapeutic activities;Therapeutic exercise;Patient/family education    PT Goals (Current goals can be found in the Care Plan section)  Acute Rehab PT Goals Patient Stated Goal: return home, enjoy retirement PT Goal Formulation: With patient Time For Goal Achievement: 03/11/24 Potential to Achieve Goals: Good    Frequency Min 2X/week     Co-evaluation               AM-PAC PT 6 Clicks Mobility  Outcome Measure Help needed turning from your back to your side while in a flat bed without using bedrails?: A Little Help needed moving from lying on your back to sitting on the side of a flat bed without using bedrails?: A Little Help needed moving to and from a bed to a chair (including a wheelchair)?: A Little Help needed standing up from a chair using your arms (e.g., wheelchair or bedside chair)?: A Little Help needed to walk in hospital room?: A Little Help needed climbing 3-5 steps with  a railing? : A Lot 6 Click Score: 17    End of Session Equipment Utilized During Treatment: Oxygen Activity Tolerance: Patient tolerated treatment well Patient left: in chair;with call bell/phone within reach Nurse Communication: Mobility status PT Visit Diagnosis: Other abnormalities of gait and mobility (R26.89);Difficulty in walking, not elsewhere classified (R26.2);Muscle weakness (generalized) (M62.81)    Time: 8758-8692 PT Time Calculation (min) (ACUTE ONLY): 26 min   Charges:   PT Evaluation $PT Eval Moderate Complexity: 1 Mod PT Treatments $Gait Training: 8-22 mins PT General Charges $$ ACUTE PT VISIT: 1 Visit         Lenoard SQUIBB, PT Acute Rehabilitation Services Office: [phone]   Alannie Amodio B Tessla Spurling 02/26/2024, 1:25 PM

## 2024-02-26 NOTE — TOC Initial Note (Incomplete)
 Transition of Care [facility]) - Initial/Assessment Note    Patient Details  Name: Brett [name] MRN: [id number] Date of Birth: 12-25-1955  [facility]) CM/SW Contact:    Justina Delcia Czar, RN Phone Number: [phone] 02/26/2024, 3:49 PM  Clinical Narrative:                 Spoke to pt and independent pta.   Expected Discharge Plan: Home w Home Health Services Barriers to Discharge: Continued Medical Work up   Patient Goals and CMS Choice Patient states their goals for this hospitalization and ongoing recovery are:: wants to get better          Expected Discharge Plan and Services   Discharge Planning Services: CM Consult   Living arrangements for the past 2 months: Single Family Home                                      Prior Living Arrangements/Services Living arrangements for the past 2 months: Single Family Home Lives with:: Adult Children Patient language and need for interpreter reviewed:: Yes Do you feel safe going back to the place where you live?: Yes      Need for Family Participation in Patient Care: No (Comment) Care giver support system in place?: No (comment) Current home services: DME (rollator, cane) Criminal Activity/Legal Involvement Pertinent to Current Situation/Hospitalization: No - Comment as needed  Activities of Daily Living   ADL Screening (condition at time of admission) Independently performs ADLs?: Yes (appropriate for developmental age) Is the patient deaf or have difficulty hearing?: No Does the patient have difficulty seeing, even when wearing glasses/contacts?: No Does the patient have difficulty concentrating, remembering, or making decisions?: No  Permission Sought/Granted Permission sought to share information with : Case Manager, Family Supports, PCP Permission granted to share information with : Yes, Verbal Permission Granted  Share Information with NAME: Neil Rinkenberger  Permission granted to share info  w AGENCY: PCP, Home Health, DME  Permission granted to share info w Relationship: son  Permission granted to share info w Contact Information: [phone]  Emotional Assessment Appearance:: Appears stated age Attitude/Demeanor/Rapport: Engaged Affect (typically observed): Accepting Orientation: : Oriented to Self, Oriented to Place, Oriented to  Time, Oriented to Situation   Psych Involvement: No (comment)  Admission diagnosis:  Adenocarcinoma of left lung (HCC) [C34.92] S/P partial lobectomy of lung [Z90.2] S/P lobectomy of lung [Z90.2] Patient Active Problem List   Diagnosis Date Noted   S/P partial lobectomy of lung 02/19/2024   S/P lobectomy of lung 02/19/2024   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 08/06/2023   Paroxysmal SVT (supraventricular tachycardia) (HCC), incidental finding IntraOp back surgery requiring esmolol ; event monitor November 2024 for short runs, longest episode 17 beats. 06/19/2023   Spinal stenosis of lumbar region 06/16/2023   Allergy    Arthritis    Blood transfusion without reported diagnosis    Chicken pox    Congenital hip deformity    History of cardiac dysrhythmia 05/23/2023   Lumbar adjacent segment disease with spondylolisthesis 05/09/2023   Corn of foot 08/09/2022   Spondylolisthesis at L5-S1 level 09/14/2020   Isthmic spondylolisthesis 05/05/2020  Scoliosis deformity of spine 05/05/2020   Neuromuscular disorder (HCC) 11/2019   Pain of left hip joint 11/01/2018   Cervical neck pain with evidence of disc disease 09/22/2015   Erectile dysfunction 09/22/2015   Rhinitis, allergic 09/22/2015   Bilateral low back pain without sciatica 09/22/2015   Need for shingles  vaccine 09/22/2015   Degenerative disc disease, cervical 01/29/2015   Preventative health care 01/29/2015   Tobacco abuse 05/30/2013   Rosacea 05/30/2013   Eczema 05/30/2013   Glaucoma 05/30/2013   Personal history of congenital hip dysplasia 05/30/2013   PCP:  [name], NP Pharmacy:   [id number] [place], [place] - [address] [place] [address] Phone: [phone] Fax: [phone]  [facility] [address] [place] [address] Phone: [phone] Fax: [phone]     Social Drivers of Health (SDOH) Social History: SDOH Screenings   Food Insecurity: No Food Insecurity (02/19/2024)  Housing: Low Risk  (02/19/2024)  Transportation Needs: No Transportation Needs (02/19/2024)  Utilities: Not At Risk (02/19/2024)  Alcohol Screen: Low Risk  (12/03/2023)  Depression (PHQ2-9): Low Risk  (01/16/2024)  Financial Resource Strain: Low Risk  (02/06/2024)  Recent Concern: Financial Resource Strain - Medium Risk (12/03/2023)  Physical Activity: Inactive (02/06/2024)  Social Connections: Moderately Integrated (02/19/2024)  Recent Concern: Social Connections - Moderately Isolated (02/06/2024)  Stress: No Stress Concern Present (02/06/2024)  Tobacco Use: Medium Risk (02/19/2024)   SDOH Interventions:     Readmission Risk Interventions     No data to display

## 2024-02-26 NOTE — Progress Notes (Signed)
 NAME:  Brett [name], MRN:  [id number], DOB:  09-Dec-1955, LOS: 7 ADMISSION DATE:  02/19/2024, CONSULTATION DATE:  7/18 REFERRING MD:  hendrickson, CHIEF COMPLAINT:  acute resp failure   History of Present Illness:  68 year old male patient with a history of tobacco abuse, COPD, paroxysmal atrial fibrillation and SVT, multiple orthopedic surgeries, and recent diagnosis with adenocarcinoma stage Ia-IIb for which she underwent a left video-assisted thoracotomy with lobectomy initially completed on 7/15 His postoperative course was complicated by an ongoing bronchopleural fistula with continuous airleak noted by chest tube, he was brought to the operating room once again on 7/18 at which time he he underwent placement of 2 endobronchial valves.  Postoperatively the patient was extubated, pulse oximetry was 80% on nonrebreather, chest x-ray showed complete opacification of the left hemithorax.  He was subsequently reintubated, and bronchoscopically the IPV's were removed.  Extubated once again in the recovery room, requiring nonrebreather, with marked work of breathing and atrial fibrillation with RVR.  Received IV Lasix , portable chest x-ray showed improvement of aeration on the left hemithorax with diffuse subcutaneous air, but what appeared to be reexpansion pulmonary edema of note the chest tube did not demonstrate air leak.  Given his underlying pulmonary status, and our involvement preoperatively pulmonary team was asked to assist with his postoperative management Given his marked increased work of breathing we opted to place him on noninvasive positive pressure ventilation knowing he still had his left chest tube in place.  The decision was made to recover him in the intensive care Pertinent  Medical History  Tobacco abuse, Copd, Afib, LLL adenocarcinoma, left VATS w/ robotic left lower lobectomy, congenital hip deformity, cervical disc disease.  Ventricular tachycardia, glaucoma,  rosacea   Significant Hospital Events: Including procedures, antibiotic start and stop dates in addition to other pertinent events   7/15 left video-assisted thoracotomy with left lower lobe lobectomy, for stage Ia/IIb adenocarcinoma 7/15 through 7/17 ongoing BPF involving the left hemithorax with ongoing hypoxia postoperatively 7/18 brought to the OR where he underwent left endobronchial valve placement x 2, postoperatively hypoxic with complete collapse of the left hemithorax necessitating emergent removal of valves.  Postprocedural chest x-ray showing improved aeration with reexpansion edema, continued to have increased respiratory effort necessitating noninvasive positive pressure ventilation 7/19 came off of BiPAP on nasal cannula oxygen, no air leak noted 7/21 CT clamped, repeat CXR pending   Interim History / Subjective:  On RA, stable CT clamped this AM, no increased dyspnea and repeat CXR this afternoon   Objective    Blood pressure 124/78, pulse (!) 122, temperature 97.7 F (36.5 C), temperature source Oral, resp. rate (!) 21, height 6' 2 (1.88 m), weight 72.6 kg, SpO2 91%.        Intake/Output Summary (Last 24 hours) at 02/26/2024 0847 Last data filed at 02/26/2024 0800 Gross per 24 hour  Intake 620.6 ml  Output 2625 ml  Net -2004.4 ml   Filed Weights   02/19/24 0550  Weight: 72.6 kg    Examination: General: well-nourished elderly M resting in bed in NAD HEENT: [place]/AT, eyes anicteric.  moist mucus membranes Neuro: Alert, awake following commands Chest: slight Left-sided subcutaneous emphysema noted, scattered LLL crackles, R side clear  Heart: Regular rate and rhythm, no murmurs or gallops Abdomen: Soft, nontender, nondistended, bowel sounds present  Labs and images reviewed  Patient Lines/Drains/Airways Status     Active Line/Drains/Airways     Name Placement date Placement time Site Days   Peripheral IV 02/23/24 20  G Left Antecubital 02/23/24  1753   Antecubital  2   Chest Tube 1 Left;Lateral Pleural 28 Fr. 02/19/24  1021  Pleural  6   Wound 02/19/24 0921 Surgical Closed Surgical Incision Chest Left 02/19/24  0921  Chest  6        Resolved problem list  Hyponatremia  Assessment and Plan    Acute hypoxic respiratory failure likely secondary to reexpansion pulmonary postop bronchial valves placement, subsequently removed Left-sided pneumothorax status post chest tube placement Left-sided subcutaneous emphysema Improved and now on RA  Encourage incentive spirometry Chest tube clamped with repeat CXR this afternoon TCTS managing  Stable for transfer out of intensive care per TCTS, PCCM will s/o  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 status post left video-assisted thoracotomy with lower lobe lobectomy complicated by bronchopleural fistula Attempted endobronchial valve valves, however had to be retrieved due to left hemithorax collapse and probable reexpansion pulmonary edema Outpatient follow-up with oncology  COPD without exacerbation Tobacco dependence Continue triple therapy bronchodilators with Brovana , budesonide , and Yupelri  Smoking cessation counseling provided  Paroxysmal atrial fibrillation with RVR Hypertension Chronic HFpEF  Patient went into A-fib with RVR, received amiodarone  bolus with conversion to sinus rhythm Amiodarone  infusion to po amio Continue Eliquis  for stroke prophylaxis Monitor and supplement electrolytes Monitor intake and output GDMT as tolerated  glaucoma Continue postoperative eyedrops  Best Practice (right click and Reselect all SmartList Selections daily)   Diet/type: Regular diet DVT prophylaxis: Eliquis  Pressure ulcers present: Please refer to nursing notes GI prophylaxis: PPI Lines: NA Foley:  N/A Code Status:  full code Last date of multidisciplinary goals of care discussion [Per primary team]  Brett [name] Brett Ketchum, PA-C [facility] See Amion for  pager If no response to pager , please call [phone] until 7pm After 7:00 pm call Elink  [phone]

## 2024-02-26 NOTE — Progress Notes (Signed)
 Patient ID: Brett [name], male   DOB: [birth year], 68 y.o.   MRN: [id number]  TCTS Evening Rounds:  Hemodynamics stable today.   Remains in NSR.  Had chest tube clamped this am but became SOB and unclamped with resolution of SOB so tube left in.  Followup CXR at noon showed no change.

## 2024-02-26 NOTE — Progress Notes (Signed)
 3 Days Post-Op Procedure(s) (LRB): VIDEO BRONCHOSCOPY WITHOUT FLUORO (N/A) BRONCHOSCOPY, FLEXIBLE, WITH INTRABRONCHIAL VALVE INSERTION (N/A) Subjective: In SR this AM Tired  Objective: Vital signs in last 24 hours: Temp:  [98.1 F (36.7 C)-98.7 F (37.1 C)] 98.4 F (36.9 C) (07/21 0300) Pulse Rate:  [61-138] 122 (07/21 0500) Cardiac Rhythm: Normal sinus rhythm (07/20 2000) Resp:  [9-28] 19 (07/21 0500) BP: (84-124)/(51-86) 124/78 (07/21 0500) SpO2:  [86 %-100 %] 91 % (07/21 0500)  Hemodynamic parameters for last 24 hours:    Intake/Output from previous day: 07/20 0701 - 07/21 0700 In: 733.5 [P.O.:360; I.V.:373.5] Out: 2625 [Urine:2475; Chest Tube:150] Intake/Output this shift: No intake/output data recorded.  General appearance: alert, cooperative, and no distress Neurologic: intact Heart: regular rate and rhythm Lungs: rhonchi bilaterally Wound: intact No air leak  Lab Results: Recent Labs    02/25/24 0651  WBC 8.7  HGB 12.5*  HCT 37.5*  PLT 134*   BMET:  Recent Labs    02/25/24 0651  NA 135  K 5.1  CL 102  CO2 25  GLUCOSE 99  BUN 15  CREATININE 0.63  CALCIUM  8.4*    PT/INR: No results for input(s): LABPROT, INR in the last 72 hours. ABG No results found for: PHART, HCO3, TCO2, ACIDBASEDEF, O2SAT CBG (last 3)  No results for input(s): GLUCAP in the last 72 hours.  Assessment/Plan: S/P Procedure(s) (LRB): VIDEO BRONCHOSCOPY WITHOUT FLUORO (N/A) BRONCHOSCOPY, FLEXIBLE, WITH INTRABRONCHIAL VALVE INSERTION (N/A) POD # 7 No air leak with strong cough this AM Drainage from tube down as well Will clamp CT this AM. Repeat CXR around noon, if no issues dc tube Path still pending Atrial fib, postoperative- in SR this AM  Convert to PO amiodarone   He is on apixaban  Ambulate  Transfer to 2C  LOS: 7 days    Brett [name] 02/26/2024

## 2024-02-27 ENCOUNTER — Inpatient Hospital Stay (HOSPITAL_COMMUNITY)

## 2024-02-27 LAB — BASIC METABOLIC PANEL WITH GFR
Anion gap: 9 (ref 5–15)
BUN: 10 mg/dL (ref 8–23)
CO2: 29 mmol/L (ref 22–32)
Calcium: 8.4 mg/dL — ABNORMAL LOW (ref 8.9–10.3)
Chloride: 98 mmol/L (ref 98–111)
Creatinine, Ser: 0.82 mg/dL (ref 0.61–1.24)
GFR, Estimated: >60 mL/min (ref >60)
Glucose, Bld: 104 mg/dL — ABNORMAL HIGH (ref 70–99)
Potassium: 4.1 mmol/L (ref 3.5–5.1)
Sodium: 136 mmol/L (ref 135–145)

## 2024-02-27 LAB — MAGNESIUM: Magnesium: 2 mg/dL (ref 1.7–2.4)

## 2024-02-27 LAB — SURGICAL PATHOLOGY

## 2024-02-27 MED ORDER — LACTULOSE 10 GM/15ML PO SOLN: 20.0000 g | Freq: Every day | ORAL | Status: DC | PRN

## 2024-02-27 MED ORDER — FLEET ENEMA RE ENEM: 1.0000 | ENEMA | Freq: Every day | RECTAL | Status: DC | PRN

## 2024-02-27 MED ORDER — MORPHINE SULFATE (PF) 2 MG/ML IV SOLN
2.0000 mg | Freq: Once | INTRAVENOUS | Status: AC
  Administered 2024-02-27: 2 mg via INTRAVENOUS
  Filled 2024-02-27: qty 1

## 2024-02-27 MED ORDER — CEFAZOLIN SODIUM-DEXTROSE 1-4 GM/50ML-% IV SOLN
1.0000 g | Freq: Three times a day (TID) | INTRAVENOUS | Status: DC
  Administered 2024-02-27 – 2024-03-01 (×8): 1 g via INTRAVENOUS
  Filled 2024-02-27 (×9): qty 50

## 2024-02-27 NOTE — Plan of Care (Signed)
  Problem: Clinical Measurements: Goal: Respiratory complications will improve Outcome: Progressing   Problem: Clinical Measurements: Goal: Cardiovascular complication will be avoided Outcome: Progressing   Problem: Clinical Measurements: Goal: Diagnostic test results will improve Outcome: Progressing   Problem: Clinical Measurements: Goal: Will remain free from infection Outcome: Progressing   Problem: Activity: Goal: Risk for activity intolerance will decrease Outcome: Progressing   Problem: Education: Goal: Knowledge of General Education information will improve Description: Including pain rating scale, medication(s)/side effects and non-pharmacologic comfort measures Outcome: Progressing

## 2024-02-27 NOTE — Progress Notes (Signed)
   [address]       [name] [address]             [phone]    Now on 2C  Resting comfortably  CT removed earlier, post removal film Ok, no respiratory issues   Reviewed path results including 2nd in situ tumor, negative nodes and question of upper GI source (although I personally think it is far more likely to lung cancer and no suspicious findings in UGI area on PET.  He has phlebitis, cellulitis of left arm from IV infiltrate- will start warm compresses and IV Ancef   Jasraj Lappe C. Kerrin, MD [facility] [phone]

## 2024-02-27 NOTE — Progress Notes (Signed)
 4 Days Post-Op Procedure(s) (LRB): VIDEO BRONCHOSCOPY WITHOUT FLUORO (N/A) BRONCHOSCOPY, FLEXIBLE, WITH INTRABRONCHIAL VALVE INSERTION (N/A) Subjective: Up in chair. Ambulated entire unit C/o hip pain and back pain  Objective: Vital signs in last 24 hours: Temp:  [97.7 F (36.5 C)-98.4 F (36.9 C)] 97.7 F (36.5 C) (07/22 0400) Pulse Rate:  [61-113] 62 (07/22 0500) Cardiac Rhythm: Normal sinus rhythm (07/21 1900) Resp:  [10-27] 21 (07/22 0500) BP: (87-122)/(54-89) 112/89 (07/22 0500) SpO2:  [87 %-99 %] 87 % (07/22 0500) Weight:  [76.2 kg] 76.2 kg (07/22 0600)  Hemodynamic parameters for last 24 hours:    Intake/Output from previous day: 07/21 0701 - 07/22 0700 In: 263.1 [P.O.:240; I.V.:23.1] Out: 3225 [Urine:3175; Chest Tube:50] Intake/Output this shift: No intake/output data recorded.  General appearance: alert, cooperative, and no distress Neurologic: intact Heart: regular rate and rhythm Lungs: diminished breath sounds left base Wound: clean and dry No air leak  Lab Results: Recent Labs    02/25/24 0651  WBC 8.7  HGB 12.5*  HCT 37.5*  PLT 134*   BMET:  Recent Labs    02/25/24 0651 02/27/24 0344  NA 135 136  K 5.1 4.1  CL 102 98  CO2 25 29  GLUCOSE 99 104*  BUN 15 10  CREATININE 0.63 0.82  CALCIUM  8.4* 8.4*    PT/INR: No results for input(s): LABPROT, INR in the last 72 hours. ABG No results found for: PHART, HCO3, TCO2, ACIDBASEDEF, O2SAT CBG (last 3)  No results for input(s): GLUCAP in the last 72 hours.  Assessment/Plan: S/P Procedure(s) (LRB): VIDEO BRONCHOSCOPY WITHOUT FLUORO (N/A) BRONCHOSCOPY, FLEXIBLE, WITH INTRABRONCHIAL VALVE INSERTION (N/A) POD # 8 Looks great this AM Not much incisional pain, mostly hip and back pain for which he is chronically on narcotics Was able to ambulate better with stand up walker There is clearly no air leak today (now 3 days in a row)- minimal drainage- dc chest tube Path still  pending In SR on amiodarone  On Eliquis  Awaiting bed on progressive unit  LOS: 8 days    Elspeth JAYSON Millers 02/27/2024

## 2024-02-28 ENCOUNTER — Inpatient Hospital Stay (HOSPITAL_COMMUNITY)

## 2024-02-28 ENCOUNTER — Other Ambulatory Visit (HOSPITAL_COMMUNITY): Payer: Self-pay

## 2024-02-28 LAB — MAGNESIUM: Magnesium: 2.1 mg/dL (ref 1.7–2.4)

## 2024-02-28 NOTE — Progress Notes (Addendum)
 5 Days Post-Op Procedure(s) (LRB): VIDEO BRONCHOSCOPY WITHOUT FLUORO (N/A) BRONCHOSCOPY, FLEXIBLE, WITH INTRABRONCHIAL VALVE INSERTION (N/A) Subjective: Feels ok  Objective: Vital signs in last 24 hours: Temp:  [97.6 F (36.4 C)-98.5 F (36.9 C)] 97.6 F (36.4 C) (07/23 0314) Pulse Rate:  [59-120] 107 (07/23 0314) Cardiac Rhythm: Normal sinus rhythm (07/22 1900) Resp:  [15-30] 18 (07/23 0314) BP: (94-112)/(51-88) 103/88 (07/23 0314) SpO2:  [94 %-100 %] 94 % (07/23 0314)  Hemodynamic parameters for last 24 hours:    Intake/Output from previous day: 07/22 0701 - 07/23 0700 In: 890 [P.O.:840; IV Piggyback:50] Out: 3475 [Urine:3475] Intake/Output this shift: No intake/output data recorded.  General appearance: alert, cooperative, and no distress Heart: regular rate and rhythm Lungs: dim in bases Abdomen: benign Extremities: left forearm w/ some cellulitis in 2 areas Wound: incis without signs of infection  Lab Results: No results for input(s): WBC, HGB, HCT, PLT in the last 72 hours. BMET:  Recent Labs    02/27/24 0344  NA 136  K 4.1  CL 98  CO2 29  GLUCOSE 104*  BUN 10  CREATININE 0.82  CALCIUM  8.4*    PT/INR: No results for input(s): LABPROT, INR in the last 72 hours. ABG No results found for: PHART, HCO3, TCO2, ACIDBASEDEF, O2SAT CBG (last 3)  No results for input(s): GLUCAP in the last 72 hours.  Meds Scheduled Meds:  amiodarone   400 mg Oral BID   apixaban   5 mg Oral BID   arformoterol   15 mcg Nebulization BID   bisacodyl   10 mg Oral Daily   budesonide  (PULMICORT ) nebulizer solution  0.5 mg Nebulization BID   Chlorhexidine  Gluconate Cloth  6 each Topical Daily   feeding supplement  1 Container Oral TID BM   gabapentin   300 mg Oral BID   latanoprost   1 drop Both Eyes q morning   metoprolol  succinate  25 mg Oral Daily   pantoprazole   40 mg Oral Daily   revefenacin   175 mcg Nebulization Daily   senna-docusate  1 tablet Oral  QHS   timolol   1 drop Both Eyes BID   Continuous Infusions:   ceFAZolin  (ANCEF ) IV 1 g (02/28/24 0604)   PRN Meds:.lactulose , levalbuterol , methocarbamol , ondansetron  (ZOFRAN ) IV, mouth rinse, oxyCODONE , sodium phosphate , sorbitol , traMADol   Xrays DG Chest 1V REPEAT Same Day Result Date: 02/27/2024 CLINICAL DATA:  Postoperative day 8 for left lower lobectomy, postoperative day 4 for removal of endobronchial valves. Chest tube removal. EXAM: CHEST - 1 VIEW SAME DAY COMPARISON:  02/27/2024 FINDINGS: The left-sided chest tube has been removed. Continued airspace opacity at the left lung base with blunting of the left lateral costophrenic angle. Equivocal 2% left apical pneumothorax. The pattern of calcification along the right apical pleura is such that there is a small peripheral amount of aerated lung peripheral to the calcification, as on the CT scan from 01/08/2024. No right pneumothorax. Emphysema. Stable airspace opacity at the right lung base. Atherosclerotic calcification of the aortic arch. Stable left subcutaneous emphysema. IMPRESSION: 1. The left-sided chest tube has been removed. Equivocal 2% left apical pneumothorax. 2. Continued airspace opacity at the left lung base with blunting of the left lateral costophrenic angle. 3. Stable airspace opacity at the right lung base. 4.  Emphysema (ICD10-J43.9). 5. Stable left subcutaneous emphysema. 6. Aortic Atherosclerosis (ICD10-I70.0). Electronically Signed   By: Ryan Salvage M.D.   On: 02/27/2024 13:19   DG Chest Port 1 View Result Date: 02/27/2024 CLINICAL DATA:  Status post left lower lobectomy for  adenocarcinoma on 02/19/2024, removal of two intrabronchial valves on 02/23/2024. EXAM: PORTABLE CHEST 1 VIEW COMPARISON:  02/26/2024 FINDINGS: Left chest tube remains in place. Stable moderate subcutaneous emphysema on the left. Stable hazy airspace opacity along the left heart border and at the left lung base. Stable bandlike densities at the right  lung base. Stable calcified pleural plaque at the right lung apex. Emphysema. Atherosclerotic calcification of the aortic arch. No pneumothorax identified. Volume loss in the left hemithorax compatible with prior lobectomy. IMPRESSION: 1. Stable left chest tube. No pneumothorax identified. 2. Stable hazy airspace opacity along the left heart border and at the left lung base. 3. Stable bandlike densities at the right lung base. 4.  Emphysema (ICD10-J43.9). 5. Stable calcified pleural plaque at the right lung apex. 6. Aortic Atherosclerosis (ICD10-I70.0). Electronically Signed   By: Ryan Salvage M.D.   On: 02/27/2024 09:36   DG Chest 1V REPEAT Same Day Result Date: 02/26/2024 CLINICAL DATA:  Status post lobectomy. EXAM: CHEST - 1 VIEW SAME DAY COMPARISON:  None Available. FINDINGS: Left-sided chest tube in similar position. Bibasilar atelectasis or infiltrate. No detectable pneumothorax. Stable cardiac silhouette. Left chest wall soft tissue emphysema. No acute osseous pathology. IMPRESSION: No interval change. Electronically Signed   By: Vanetta Chou M.D.   On: 02/26/2024 15:56    SURGICAL PATHOLOGY CASE: [id number] PATIENT: Laura Membreno Surgical Pathology Report     Clinical History: LLL adenocarcinoma (cm)     FINAL MICROSCOPIC DIAGNOSIS:  A. LYMPH NODE, 4L, EXCISION: - One lymph node, negative for carcinoma (0/1).  B. LYMPH NODE, 5, EXCISION: - One lymph node, negative for carcinoma (0/1).  C. 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 - Separate focus of adenocarcinoma in situ, 1.9 cm. - Margins negative for carcinoma. - Eleven lymph nodes, all negative for carcinoma (0/11).  Comment: Sections of the primary lung mass demonstrate a poorly differentiated carcinoma which is primarily solid in pattern and has geographic areas of necrosis.  Focally, the tumor shows  glandular differentiation.  Immunohistochemical stains are performed to differentiate the tumor.  The tumor shows focal positivity for CK7 in the poorly differentiated component, with more diffuse positivity in the glandular differentiated area.  CK20 demonstrates similar findings. TTF-1, Napsin, p40, CK5/6, GATA3, and NKX3.1 are negative.  CDX2 demonstrates weak to moderate positivity within both components of the tumor.  These findings are suggestive of possible metastasis, with a differential including upper gastrointestinal and pancreaticobiliary origin.  A poorly differentiated primary pulmonary enteric adenocarcinoma is also in the differential.  Clinical and radiographic correlation is required.  D. LYMPH NODE, 9, EXCISION: - One lymph node, negative for carcinoma (0/1).  E. LYMPH NODE, 8, EXCISION: - One lymph node, negative for carcinoma (0/1).  F. LYMPH NODE, 7, EXCISION: - One lymph node, negative for carcinoma (0/1).  G. LYMPH NODE, 7 #2, EXCISION: - One lymph node, negative for carcinoma (0/1).  H. LYMPH NODE, 10, EXCISION: - One lymph node, negative for carcinoma (0/1).  I. LYMPH NODE, 5 #2, EXCISION: - One lymph node, negative for carcinoma (0/1).  J. LYMPH NODE, 5 #3, EXCISION: - One lymph node, negative for carcinoma (0/1).  K. LYMPH NODE, 11, EXCISION: - One lymph node, negative for carcinoma (0/1).  L. LYMPH NODE, 11 #2, EXCISION: - One lymph node, negative for carcinoma (0/1).  M. LYMPH NODE, 12, EXCISION: - One lymph node, negative for carcinoma (0/1).  N. LYMPH NODE,  10 #2, EXCISION: - One lymph node, negative for carcinoma (0/1).  CASE SUMMARY: LUNG Standard(s): AJCC 9  SPECIMEN Synchronous Tumors: Present - Total number of primary tumors: 2 - Specimen IDs: Primary (solid mass), secondary (hazy lesion) Procedure: Lobectomy Specimen Laterality: Left  TUMOR - Primary Tumor Focality: Single focus Tumor Site: Lower lobe of lung Tumor  Size      Invasive Tumor Size: 1.9 cm      Total Tumor Size: Not applicable Histologic Type: Non-small cell carcinoma, subtype cannot be determined: see diagnostic comment Histologic Patterns: - Solid: 95% - Acinar: 5% Spread Through Air Spaces (STAS): Present Visceral Pleura Invasion: Not identified Direct Invasion of Adjacent Structures: Not applicable (no other structures present) Treatment Effect: No known presurgical therapy Lymphatic and / or Vascular Invasion: Not identified  TUMOR - Secondary Tumor Focality: Single focus Tumor Site: Lower lobe of lung Tumor Size      Invasive Tumor Size: 0 cm (noninvasive)      Total Tumor Size: 1.9 cm Histologic Type: Adenocarcinoma in situ, nonmucinous Histologic Patterns: Not applicable Spread Through Air Spaces (STAS): Not applicable Visceral Pleura Invasion: Not identified Direct Invasion of Adjacent Structures: Not applicable (no other structures present) Treatment Effect: No known presurgical therapy Lymphatic and / or Vascular Invasion: Not identified  MARGINS Margin Status for Invasive Tumor: All margins negative for invasive tumor - Closest margin to invasive tumor: Cannot be determined: Distance to parenchymal margin not provided - Distance from invasive tumor to closest margin: Greater than 5 cm Margin Status for Non-Invasive Tumor: All margins negative for noninvasive tumor  REGIONAL LYMPH NODES Lymph Node(s) from Prior Procedures: No known prior lymph node sampling performed Regional Lymph Node Status: Regional lymph nodes present - All regional lymph nodes negative for tumor - Number of lymph nodes examined: 24 - Nodal sites examined: 4L, 5, 7, 8L, 9L, 10L, 11L, 12L  DISTANT METASTASIS Distant Site(s) Involved, if applicable: Not applicable  PATHOLOGIC STAGE CLASSIFICATION (pTNM, AJCC 9th Edition): Modified Classification: Not applicable pT1b      T Suffix: Not applicable pN0      N Suffix: Not  applicable pM - Not applicable  ADDITIONAL FINDINGS None identified  (v5.0.0.0)   INTRAOPERATIVE DIAGNOSIS:  A.  Lymph Node, 4L: no metastatic carcinoma identified. Intraoperative diagnosis rendered by Dr. Belvie at 09:18 on 19 February 2024.  B.  Lymph Node, 5: no metastatic carcinoma identified. Intraoperative diagnosis rendered by Dr. Belvie at 09:27 on 19 February 2024.  C.  Lung, Left Lower Lobe, Bronchial Margin: no tumor identified. Intraoperative diagnosis rendered by Dr. Belvie at 11:05 on 19 February 2024.  GROSS DESCRIPTION:  A. Received fresh for intraoperative consultation labeled with the patient's name and 4L lymph node is a 0.6 cm black, rubbery possible lymph node that is bisected and submitted on a single chuck for frozen section diagnosis and subsequently in cassette A1 for permanent.  B. Received fresh for intraoperative consultation labeled with the patient's name and 5 lymph node is a 1.1 cm black, rubbery possible lymph node that is bisected and submitted on a single chuck for frozen section diagnosis and subsequently in cassette B1 for permanent.  C. Received fresh for intraoperative consultation labeled with the patient's name and Left lower lobe is a 20.1 x 18.8 x 13.4 cm lung lobe covered in red pleura with moderate black webbing. The bronchial margin is submitted en face on a single chuck for frozen section diagnosis and subsequently in cassette C1 for permanent.  Sectioning reveals a subpleural 1.9 x 1.6 x 0.9 cm tan, lobular, solid mass is in the posterior of the midportion of the lobe, located 7.7 cm from the bronchial resection margin. Horizontally separated by a distance of 5.2 cm, in the anterior aspect is a 1.9 x 1.4 x 1.2 cm hazy, indurated lesion. In the superior aspect is a 0.7 x 0.5 x 0.3 cm gray to black, solid nodule.      Elsewhere, the parenchyma is red-brown and spongy with dilated airspaces and hazy patches. Along the  bronchial tree, 12 black, rubbery possible lymph nodes ranging form 0.3-1.5 are identified. Block Summary C1: frozen section remnant, bronchial margin en face C2-C4: entire solid mass C5: anterior hazy lesion C6: superior nodule C7-C8: uninvolved parenchyma C9: seven whole nodes C10: four whole nodes C11: one sectioned node  D. Received in saline labeled with the patient's name and 9 lymph node is a 0.9 cm black, rubbery possible lymph node that is submitted in toto in cassette D1.  E. Received in saline labeled with the patient's name and 8 lymph node are three pieces of black, rubbery possible lymph node, 0.3-0.5 cm in greatest dimension, in cassette E1.  F. Received in saline labeled with the patient's name and 7 lymph node is a 1.4 cm black, rubbery possible lymph node that is sectioned and entirely submitted in cassette F1.  G. Received in saline labeled with the patient's name and 7 lymph node #2 are four pieces of black, rubbery possible lymph node, 0.2-0.3 cm in greatest dimension, submitted in toto in cassette G1.  H. Received in saline labeled with the patient's name and 10 lymph node is a 0.8 x 0.8 x 0.1 cm aggregate of black, rubbery possible lymph node that is submitted in toto in cassette H1.  I. Received in saline labeled with the patient's name and 5 lymph node #2 is a 0.8 cm black, rubbery possible lymph node that is trisected and entirely submitted in cassette I1.  J. Received in saline labeled with the patient's name and 5 lymph node #3 is a 1.5 x 1.5 x 0.2 cm aggregate black, rubbery possible lymph node that is submitted in toto in cassette J1.  K. Received in saline labeled with the patient's name and 11 lymph node is a 1.5 x 1.3 x 0.2 cm aggregate black, rubbery possible lymph node that is submitted in toto in cassette K1.  L. Received in saline labeled with the patient's name and 11 lymph node #2 is a 0.9 x 0.8 x 0.2 cm aggregate  black, rubbery possible lymph node that is submitted in toto in cassette L1.  M. Received in saline labeled with the patient's name and 12 node is a 1.4 x 1.3 x 0.2 cm aggregate black, rubbery possible lymph node that is submitted in toto in cassette M1.  N. Received in saline labeled with the patient's name and 10 lymph node #2 is a 1.1 x 0.6 x 0.2 cm aggregate black, rubbery possible lymph node that is submitted in toto in cassette N1.  (LEF 02/20/2024)  Final Diagnosis performed by Wally Highland, MD.   Electronically signed 02/27/2024 Technical and / or Professional components performed at [facility]. 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 - Separate focus of adenocarcinoma in situ, 1.9 cm. - Margins negative for carcinoma. - Eleven lymph nodes, all negative for carcinoma (0/11).  7 on ancef  for phlebitis/cellulitis- left forearm     LOS: 9 days    Lemond FORBES Cera PA-C Pager [phone] 02/28/2024 Patient seen and examined, agree with above  Elspeth C. Kerrin, MD [facility] [phone]

## 2024-02-28 NOTE — Progress Notes (Signed)
 Physical Therapy Treatment Patient Details Name: Brett [name] MRN: [id number] DOB: 1956/02/14 Today's Date: 02/28/2024   History of Present Illness 68 yo male admitted 02/19/24 for partial lobectomy of LLL. 7/18 post op air leak with bronchoscopy and endobronchial valve placement. 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 Pt states that he uses a rollator at home. Trialed a rollator today with good mobility; pt has rollator he can use at home. Currently pt is CGA to supervision for all functional activities. Pt O2 sats dropped considerably during gait with poor pleth reading; RN was notified. Pt was back up to the 90's O2 sats in sitting on 4L O2 via [place]. Due to pt current functional status, home set up and available assistance at home recommending skilled physical therapy services 3x/week in order to address strength, balance and functional mobility to decrease risk for falls, injury and re-hospitalization.       If plan is discharge home, recommend the following: A little help with walking and/or transfers;Assistance with cooking/housework;Assist for transportation     Equipment Recommendations  None recommended by PT       Precautions / Restrictions Precautions Precautions: Fall;Other (comment) Recall of Precautions/Restrictions: Impaired Precaution/Restrictions Comments: watch sats Restrictions Weight Bearing Restrictions Per Provider Order: No     Mobility  Bed Mobility Overal bed mobility: Needs Assistance Bed Mobility: Supine to Sit, Sit to Supine     Supine to sit: Modified independent (Device/Increase time), HOB elevated Sit to supine: Supervision   General bed mobility comments: increased time to get into bed with assist with pillows    Transfers Overall transfer level: Needs assistance Equipment used: Rollator (4 wheels) Transfers: Sit to/from  Stand Sit to Stand: Supervision           General transfer comment: with rollator pt did well with brakes, 1x reminder    Ambulation/Gait Ambulation/Gait assistance: Contact guard assist Gait Distance (Feet): 400 Feet Assistive device: Rolling walker (2 wheels) Gait Pattern/deviations: Step-through pattern, Decreased stride length, Trunk flexed, Narrow base of support Gait velocity: decreased Gait velocity interpretation: <1.8 ft/sec, indicate of risk for recurrent falls   General Gait Details: Pt O2 sats dropping to 75% on 4 L then 81% on 6L pt bumped up to 8L with poor pleth reading continued with difficutly getting up to 88% at 85% by time back to room. Pt O2 sats quickly back up to 90's in sitting on 4L     Balance Overall balance assessment: Needs assistance Sitting-balance support: No upper extremity supported, Feet supported Sitting balance-Leahy Scale: Good     Standing balance support: Bilateral upper extremity supported, Reliant on assistive device for balance, During functional activity Standing balance-Leahy Scale: Fair Standing balance comment: no overt LOB        Communication Communication Communication: No apparent difficulties  Cognition Arousal: Alert Behavior During Therapy: WFL for tasks assessed/performed   PT - Cognitive impairments: No apparent impairments     Following commands: Intact      Cueing Cueing Techniques: Verbal cues     General Comments General comments (skin integrity, edema, etc.): Pt o2 sats down to 75% with some cyanosis at the nose during gait, bumped up to 6L then 8L with continued difficutly. Once in sitting pt O2 sats back up to 90's on 4L. Poor pleth reading throughout.      Pertinent Vitals/Pain Pain Assessment Pain Assessment: Faces Faces  Pain Scale: Hurts a little bit Pain Location: back Pain Descriptors / Indicators: Sore Pain Intervention(s): Monitored during session, Limited activity within patient's tolerance      PT Goals (current goals can now be found in the care plan section) Acute Rehab PT Goals Patient Stated Goal: return home, enjoy retirement PT Goal Formulation: With patient Time For Goal Achievement: 03/11/24 Potential to Achieve Goals: Good Progress towards PT goals: Progressing toward goals    Frequency    Min 2X/week      PT Plan  Continue with current POC        AM-PAC PT 6 Clicks Mobility   Outcome Measure  Help needed turning from your back to your side while in a flat bed without using bedrails?: A Little Help needed moving from lying on your back to sitting on the side of a flat bed without using bedrails?: A Little Help needed moving to and from a bed to a chair (including a wheelchair)?: A Little Help needed standing up from a chair using your arms (e.g., wheelchair or bedside chair)?: A Little Help needed to walk in hospital room?: A Little Help needed climbing 3-5 steps with a railing? : A Little 6 Click Score: 18    End of Session Equipment Utilized During Treatment: Oxygen Activity Tolerance: Patient tolerated treatment well Patient left: in chair;with call bell/phone within reach Nurse Communication: Mobility status PT Visit Diagnosis: Other abnormalities of gait and mobility (R26.89);Difficulty in walking, not elsewhere classified (R26.2);Muscle weakness (generalized) (M62.81)     Time: 8861-8797 PT Time Calculation (min) (ACUTE ONLY): 24 min  Charges:    $Therapeutic Activity: 23-37 mins PT General Charges $$ ACUTE PT VISIT: 1 Visit                    Dorothyann Maier, DPT, CLT  Acute Rehabilitation Services Office: [phone] (Secure chat preferred)    Dorothyann VEAR Maier 02/28/2024, 12:57 PM

## 2024-02-28 NOTE — TOC Progression Note (Signed)
 Transition of Care [facility]) - Progression Note    Patient Details  Name: Brett [name] MRN: [id number] Date of Birth: October 26, 1955  [facility] CM/SW Contact  Roxie KANDICE Stain, RN Phone Number: 02/28/2024, 2:45 PM  Clinical Narrative:     Patient is agreeable to home health. This RNCM offered choice for Home Health, [name] states he has no preference, RNCM made referral to Amy with [facility], She is able to take referral. Patient states he doesn't need walker due to having one at home. Daughter can transport home when ready. Address, Phone number and PCP verified.  Expected Discharge Plan: Home w Home Health Services Barriers to Discharge: Continued Medical Work up               Expected Discharge Plan and Services   Discharge Planning Services: CM Consult   Living arrangements for the past 2 months: Single Family Home                           HH Arranged: OT, PT [facility] Agency: [facility] Date [facility] Agency Contacted: 02/28/24 Time HH Agency Contacted: [phone] Representative spoke with at [facility] Agency: Amy   Social Drivers of Health (SDOH) Interventions SDOH Screenings   Food Insecurity: No Food Insecurity (02/19/2024)  Housing: Low Risk  (02/19/2024)  Transportation Needs: No Transportation Needs (02/19/2024)  Utilities: Not At Risk (02/19/2024)  Alcohol Screen: Low Risk  (12/03/2023)  Depression (PHQ2-9): Low Risk  (01/16/2024)  Financial Resource Strain: Low Risk  (02/06/2024)  Recent Concern: Financial Resource Strain - Medium Risk (12/03/2023)  Physical Activity: Inactive (02/06/2024)  Social Connections: Moderately Integrated (02/19/2024)  Recent Concern: Social Connections - Moderately Isolated (02/06/2024)  Stress: No Stress Concern Present (02/06/2024)  Tobacco Use: Medium Risk (02/19/2024)    Readmission Risk Interventions     No data to display

## 2024-02-28 NOTE — Evaluation (Signed)
 Occupational Therapy Evaluation Patient Details Name: Brett [name] MRN: [id number] DOB: 13-Jan-1956 Today's Date: 02/28/2024   History of Present Illness   68 yo male admitted 02/19/24 for partial lobectomy of LLL. 7/18 post op air leak with bronchoscopy and endobronchial valve placement. 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 Pt presents now with deficits in dynamic standing balance, strength and cardiopulmonary endurance. Pt requiring CGA for transfers/mobility using RW and no more than CGA-Min A for ADLs. Anticipate good progress acutely to DC home with HHOT. VSS on 3 L O2.   If plan is discharge home, recommend the following:   A little help with bathing/dressing/bathroom;Assistance with cooking/housework;Assist for transportation;Help with stairs or ramp for entrance     Functional Status Assessment   Patient has had a recent decline in their functional status and demonstrates the ability to make significant improvements in function in a reasonable and predictable amount of time.     Equipment Recommendations   None recommended by OT     Recommendations for Other Services         Precautions/Restrictions   Precautions Precautions: Fall;Other (comment) Recall of Precautions/Restrictions: Impaired Precaution/Restrictions Comments: watch sats Restrictions Weight Bearing Restrictions Per Provider Order: No     Mobility Bed Mobility Overal bed mobility: Needs Assistance Bed Mobility: Supine to Sit     Supine to sit: Modified independent (Device/Increase time), HOB elevated          Transfers Overall transfer level: Needs assistance Equipment used: Rolling walker (2 wheels) Transfers: Sit to/from Stand Sit to Stand: Contact guard assist, Via lift  equipment           General transfer comment: CGA to stand from elevated bed with RW      Balance Overall balance assessment: Needs assistance Sitting-balance support: No upper extremity supported, Feet supported Sitting balance-Leahy Scale: Good     Standing balance support: Bilateral upper extremity supported, Reliant on assistive device for balance, During functional activity Standing balance-Leahy Scale: Poor                             ADL either performed or assessed with clinical judgement   ADL Overall ADL's : Needs assistance/impaired Eating/Feeding: Independent   Grooming: Supervision/safety;Standing   Upper Body Bathing: Set up;Sitting   Lower Body Bathing: Sitting/lateral leans;Contact guard assist;Sit to/from stand   Upper Body Dressing : Set up;Sitting   Lower Body Dressing: Minimal assistance;Sitting/lateral leans;Sit to/from stand   Toilet Transfer: Contact guard assist;Ambulation;Rolling walker (2 wheels) Toilet Transfer Details (indicate cue type and reason): CGA for mobility to/from bathroom using RW- adjusted RW height to improve posture Toileting- Clothing Manipulation and Hygiene: Contact guard assist;Sit to/from stand;Sitting/lateral lean       Functional mobility during ADLs: Contact guard assist;Rolling walker (2 wheels)       Vision Baseline Vision/History: 1 Wears glasses Ability to See in Adequate Light: 0 Adequate Patient Visual Report: No change from baseline Vision Assessment?: No apparent visual deficits     Perception         Praxis         Pertinent Vitals/Pain Pain Assessment Pain Assessment: Faces Faces Pain Scale: Hurts a little bit Pain Location: back Pain Descriptors / Indicators: Sore Pain Intervention(s): Monitored during session, Limited activity  within patient's tolerance     Extremity/Trunk Assessment Upper Extremity Assessment Upper Extremity Assessment: Overall WFL for tasks assessed;Right  hand dominant   Lower Extremity Assessment Lower Extremity Assessment: Defer to PT evaluation   Cervical / Trunk Assessment Cervical / Trunk Assessment: Kyphotic   Communication Communication Communication: No apparent difficulties   Cognition Arousal: Alert Behavior During Therapy: WFL for tasks assessed/performed Cognition: No apparent impairments                               Following commands: Intact       Cueing  General Comments   Cueing Techniques: Verbal cues  HR 60s, SpO2 WFL on 3 L O2   Exercises     Shoulder Instructions      Home Living Family/patient expects to be discharged to:: Private residence Living Arrangements: Children Available Help at Discharge: Family;Available 24 hours/day Type of Home: House Home Access: Stairs to enter [employer] of Steps: 1   Home Layout: One level     Bathroom Shower/Tub: Producer, television/film/video: Handicapped height     Home Equipment: Educational psychologist (4 wheels);Grab bars - tub/shower;Hand held Programmer, systems (2 wheels);Cane - single point          Prior Functioning/Environment Prior Level of Function : Independent/Modified Independent             Mobility Comments: Rollator for mobility ADLs Comments: mod I, Drives, assists daughter    OT Problem List: Decreased strength;Decreased activity tolerance;Impaired balance (sitting and/or standing);Cardiopulmonary status limiting activity;Pain   OT Treatment/Interventions: Self-care/ADL training;Therapeutic exercise;Energy conservation;DME and/or AE instruction;Therapeutic activities;Patient/family education;Balance training      OT Goals(Current goals can be found in the care plan section)   Acute Rehab OT Goals Patient Stated Goal: go home soon OT Goal Formulation: With patient Time For Goal Achievement: 03/13/24 Potential to Achieve Goals: Good ADL Goals Pt Will Perform Lower Body Dressing: with  modified independence;sitting/lateral leans;sit to/from stand Pt Will Transfer to Toilet: with modified independence;ambulating Additional ADL Goal #1: Pt to verbalize at least 3 energy conservation strategies to implement during ADLs, IADLs and mobility Additional ADL Goal #2: Pt to increase standing activity tolerance > 10 min during ADLs/mobility without need for seated rest break   OT Frequency:  Min 2X/week    Co-evaluation              AM-PAC OT 6 Clicks Daily Activity     Outcome Measure Help from another person eating meals?: None Help from another person taking care of personal grooming?: A Little Help from another person toileting, which includes using toliet, bedpan, or urinal?: A Little Help from another person bathing (including washing, rinsing, drying)?: A Little Help from another person to put on and taking off regular upper body clothing?: A Little Help from another person to put on and taking off regular lower body clothing?: A Little 6 Click Score: 19   End of Session Equipment Utilized During Treatment: Rolling walker (2 wheels);Oxygen Nurse Communication: Mobility status  Activity Tolerance: Patient tolerated treatment well Patient left: Other (comment) (in bathroom with instruction to use call bell for signal assist out of bathroom. OT awaiting outside- NT present to assist pt out of bathroom)  OT Visit Diagnosis: Other abnormalities of gait and mobility (R26.89);Muscle weakness (generalized) (M62.81)                Time: 9162-9144 OT Time Calculation (  min): 18 min Charges:  OT General Charges $OT Visit: 1 Visit OT Evaluation $OT Eval Moderate Complexity: 1 Mod  Mliss NOVAK, OTR/L Acute Rehab Services Office: [phone]   Mliss Fish 02/28/2024, 9:10 AM

## 2024-02-29 LAB — MAGNESIUM: Magnesium: 1.9 mg/dL (ref 1.7–2.4)

## 2024-02-29 MED ORDER — MAGNESIUM OXIDE -MG SUPPLEMENT 400 (240 MG) MG PO TABS
400.0000 mg | ORAL_TABLET | Freq: Every day | ORAL | Status: DC
  Administered 2024-02-29 – 2024-03-03 (×4): 400 mg via ORAL
  Filled 2024-02-29 (×4): qty 1

## 2024-02-29 NOTE — TOC Progression Note (Signed)
 Transition of Care [facility]) - Progression Note    Patient Details  Name: Brett [name] MRN: [id number] Date of Birth: 1956-04-30  [facility] CM/SW Contact  Roxie KANDICE Stain, RN Phone Number: 02/29/2024, 12:17 PM  Clinical Narrative:     Orders for home 02. Notified Mitch with [employer]   Expected Discharge Plan: Home w Home Health Services Barriers to Discharge: Continued Medical Work up               Expected Discharge Plan and Services   Discharge Planning Services: CM Consult   Living arrangements for the past 2 months: Single Family Home                 DME Arranged: Oxygen DME Agency: [employer] Date DME Agency Contacted: 02/29/24 Time DME Agency Contacted: 1217 Representative spoke with at DME Agency: Mitch HH Arranged: OT, PT HH Agency: [facility] Date [facility] Agency Contacted: 02/28/24 Time HH Agency Contacted: 1444 Representative spoke with at [facility] Agency: Amy   Social Drivers of Health (SDOH) Interventions SDOH Screenings   Food Insecurity: No Food Insecurity (02/19/2024)  Housing: Low Risk  (02/19/2024)  Transportation Needs: No Transportation Needs (02/19/2024)  Utilities: Not At Risk (02/19/2024)  Alcohol Screen: Low Risk  (12/03/2023)  Depression (PHQ2-9): Low Risk  (01/16/2024)  Financial Resource Strain: Low Risk  (02/06/2024)  Recent Concern: Financial Resource Strain - Medium Risk (12/03/2023)  Physical Activity: Inactive (02/06/2024)  Social Connections: Moderately Integrated (02/19/2024)  Recent Concern: Social Connections - Moderately Isolated (02/06/2024)  Stress: No Stress Concern Present (02/06/2024)  Tobacco Use: Medium Risk (02/19/2024)    Readmission Risk Interventions     No data to display

## 2024-02-29 NOTE — Progress Notes (Signed)
 Physical Therapy Treatment Patient Details Name: Brett [name] MRN: [id number] DOB: 1955-11-26 Today's Date: 02/29/2024   History of Present Illness 68 yo male admitted 02/19/24 for partial lobectomy of LLL. 7/18 post op air leak with bronchoscopy and endobronchial valve placement x2. 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    PT Comments  Pt received standing and preparing for  hallway ambulation in care of Mobility Specialist, pt agreeable to therapy session with emphasis on gait and stair training (pt has 2 STE not in a row), as well as instruction on back precs for comfort with transfers. Pt pulling ~1250 on IS and needing up to 4L/min O2 via HFNC to maintain SpO2 at 88% and above, with min cues for standing rest breaks when SpO2 decreased to 88%. Pt needing up to minA to perform log roll for bed mobility with instruction on technique (handout provided) and mostly CGA to Supervision for transfers/gait/stair negotiation with BUE support. Pt continues to benefit from PT services to progress toward functional mobility goals, continue to recommend HHPT upon DC.     If plan is discharge home, recommend the following: A little help with walking and/or transfers;Assistance with cooking/housework;Assist for transportation   Can travel by private vehicle        Equipment Recommendations  None recommended by PT    Recommendations for Other Services       Precautions / Restrictions Precautions Precautions: Fall;Other (comment) Recall of Precautions/Restrictions: Impaired Precaution/Restrictions Comments: watch sats, baseline R hip and low back pain so handout on back precs given for comfort Restrictions Weight Bearing Restrictions Per Provider Order: No     Mobility  Bed Mobility Overal bed mobility: Needs Assistance Bed Mobility: Sit to Sidelying, Rolling Rolling: Modified independent (Device/Increase  time), Used rails   Supine to sit: Used rails, Min assist     General bed mobility comments: cues for log roll for back pain/comfort and light minA at BLE as pt reports he is not familiar with this technique. modI rolling for adjustment of linens/gown.    Transfers Overall transfer level: Needs assistance Equipment used: Rollator (4 wheels) Transfers: Sit to/from Stand Sit to Stand: Supervision           General transfer comment: rollator>EOB, pt defers to sit up in chair post-exertion    Ambulation/Gait Ambulation/Gait assistance: Contact guard assist, Supervision Gait Distance (Feet): 300 Feet Assistive device: Rollator (4 wheels) Gait Pattern/deviations: Step-through pattern, Decreased stride length, Trunk flexed, Drifts right/left, Antalgic, Decreased stance time - left Gait velocity: decreased     General Gait Details: Pt SpO2 hypoxic on RA and 86% on 2L HFNC, improves to 88% and above on 4L HFNC with exertion. Cues for trunk or at least cx extension and to avoid rounded shoulders/forward head posture to improve lung inflation/aeration and activity pacing/standing rest breaks when SpO2 decreases to 88%, with rebound to 89-91% on 4L when standing and pacing appropriately. Pt reports moderate fatigue, 5/10 modified RPE at end of session.   Stairs Stairs: Yes Stairs assistance: Contact guard assist Stair Management: Two rails, Step to pattern, Backwards, Forwards Number of Stairs: 1 (x2) General stair comments: pt ascended/descended single 7 step x2 reps, leading with less painful RLE and descending with LLE, no LOB or buckling, CGA for safety only, RW to simulate bil rails   Wheelchair Mobility     Tilt Bed    Modified Rankin (Stroke Patients Only)  Balance Overall balance assessment: Needs assistance Sitting-balance support: No upper extremity supported, Feet supported Sitting balance-Leahy Scale: Good     Standing balance support: Reliant on  assistive device for balance, During functional activity Standing balance-Leahy Scale: Poor Standing balance comment: Fair wtih rollator, did not assess unsupported dynamic standing balance but pt reports AD use at baseline.                            Communication Communication Communication: No apparent difficulties  Cognition Arousal: Alert Behavior During Therapy: WFL for tasks assessed/performed   PT - Cognitive impairments: No apparent impairments                       PT - Cognition Comments: Cooperative, motivated Following commands: Intact      Cueing Cueing Techniques: Verbal cues  Exercises Other Exercises Other Exercises: pt instructed on diaphragmatic breathing vs pursed-lip breathing and pulls ~1250-1400 on IS x 5 reps    General Comments General comments (skin integrity, edema, etc.): see gait comments, needing 4L HF [place] to maintain SpO2 within goal of 88% and greater      Pertinent Vitals/Pain Pain Assessment Pain Assessment: 0-10 Pain Score: 4  Faces Pain Scale: Hurts even more Pain Location: back and L hip, chronic pain per pt, and L flank pain/residual pain from CT removal site Pain Descriptors / Indicators: Sore, Discomfort, Tender, Guarding Pain Intervention(s): Limited activity within patient's tolerance, Monitored during session, Repositioned, Other (comment) (pt reports pain improved to 3-4 once back in bed chair posture)    Home Living                          Prior Function            PT Goals (current goals can now be found in the care plan section) Acute Rehab PT Goals Patient Stated Goal: Return home, enjoy retirement PT Goal Formulation: With patient Time For Goal Achievement: 03/11/24 Progress towards PT goals: Progressing toward goals    Frequency    Min 2X/week      PT Plan      Co-evaluation              AM-PAC PT 6 Clicks Mobility   Outcome Measure  Help needed turning from your  back to your side while in a flat bed without using bedrails?: None Help needed moving from lying on your back to sitting on the side of a flat bed without using bedrails?: A Little Help needed moving to and from a bed to a chair (including a wheelchair)?: A Little Help needed standing up from a chair using your arms (e.g., wheelchair or bedside chair)?: A Little Help needed to walk in hospital room?: A Little Help needed climbing 3-5 steps with a railing? : A Little 6 Click Score: 19    End of Session Equipment Utilized During Treatment: Gait belt;Oxygen Activity Tolerance: Patient tolerated treatment well Patient left: in bed;with call bell/phone within reach;Other (comment) (bed in upright chair posture with heels floated per pt request, he defers OOB to chair; encouraged IS use) Nurse Communication: Mobility status;Other (comment) (O2 amb sats; pt asking for coffee) PT Visit Diagnosis: Other abnormalities of gait and mobility (R26.89);Difficulty in walking, not elsewhere classified (R26.2);Muscle weakness (generalized) (M62.81)     Time: 8991-8968 PT Time Calculation (min) (ACUTE ONLY): 23 min  Charges:    $Gait  Training: 8-22 mins $Therapeutic Activity: 8-22 mins PT General Charges $$ ACUTE PT VISIT: 1 Visit                     Kelena Garrow P., PTA Acute Rehabilitation Services Secure Chat Preferred 9a-5:30pm Office: [phone]    Connell [name] [facility] 02/29/2024, 11:04 AM

## 2024-02-29 NOTE — Progress Notes (Signed)
 Mobility Specialist Progress Note;   02/29/24 0957  Mobility  Activity Ambulated with assistance in hallway  Level of Assistance Standby assist, set-up cues, supervision of patient - no hands on  Assistive Device Four wheel walker  Distance Ambulated (ft) 200 ft  Activity Response Tolerated well  Mobility Referral Yes  Mobility visit 1 Mobility  Mobility Specialist Start Time (ACUTE ONLY) 0957  Mobility Specialist Stop Time (ACUTE ONLY) 1014  Mobility Specialist Time Calculation (min) (ACUTE ONLY) 17 min   RN requesting walking O2 test, pt agreeable. Required no physical assistance during ambulation, SV. Attempted ambulation on RA, however SPO2 desat to 82%, requiring up to 4LO2 to maintain 89%>. No c/o when asked. Pt handed off to PT for their session.   Lauraine Erm Mobility Specialist Please contact via SecureChat or [employer] [phone]

## 2024-02-29 NOTE — Progress Notes (Signed)
 Nurse requested Mobility Specialist to perform oxygen saturation test with pt which includes removing pt from oxygen both at rest and while ambulating.  Below are the results from that testing.     Patient Saturations on Room Air at Rest = spO2 86%  Patient Saturations on Room Air while Ambulating = sp02 82% .    Patient Saturations on 4 Liters of oxygen while Ambulating = sp02 89%  At end of testing pt left in room on 4  Liters of oxygen.  Reported results to nurse.    Lauraine Erm Mobility Specialist Please contact via SecureChat or [employer] [phone]

## 2024-02-29 NOTE — Progress Notes (Signed)
   Durable Medical Equipment (From admission, onward)        Start     Ordered  02/29/24 1209  For home use only DME oxygen  Once      Question Answer Comment Length of Need 6 Months  Mode or (Route) Nasal cannula  Liters per Minute 4  Frequency Continuous (stationary and portable oxygen unit needed)  Oxygen conserving device Yes  Oxygen delivery system Gas    02/29/24 1210  02/29/24 1209  For home use only DME Other see comment  Once      Comments: Portable oxygen concentrator evaluation 1-6L pulse dose. If qualifies, dispense. Question:  Length of Need  Answer:  6 Months  02/29/24 1210  02/28/24 1102  For home use only DME Walker tall  Once      Question Answer Comment Patient needs a walker to treat with the following condition S/P lobectomy of lung  Patient needs a walker to treat with the following condition Physical deconditioning    02/28/24 1102        Scruggs, Lauraine [name]  Mobility Specialist   Progress Notes    Signed   Date of Service: 02/29/2024 10:15 AM   Signed      Nurse requested Mobility Specialist to perform oxygen saturation test with pt which includes removing pt from oxygen both at rest and while ambulating.  Below are the results from that testing.      Patient Saturations on Room Air at Rest = spO2 86%   Patient Saturations on Room Air while Ambulating = sp02 82% .     Patient Saturations on 4 Liters of oxygen while Ambulating = sp02 89%   At end of testing pt left in room on 4  Liters of oxygen.   Reported results to nurse.      Lauraine Erm Mobility Specialist Please contact via SecureChat or [employer] [phone]

## 2024-02-29 NOTE — Progress Notes (Addendum)
 6 Days Post-Op Procedure(s) (LRB): VIDEO BRONCHOSCOPY WITHOUT FLUORO (N/A) BRONCHOSCOPY, FLEXIBLE, WITH INTRABRONCHIAL VALVE INSERTION (N/A) Subjective: Feels ok  Objective: Vital signs in last 24 hours: Temp:  [97.8 F (36.6 C)-99.1 F (37.3 C)] 98.5 F (36.9 C) (07/24 0447) Pulse Rate:  [60-119] 63 (07/24 0447) Cardiac Rhythm: Normal sinus rhythm (07/23 1900) Resp:  [12-22] 20 (07/24 0447) BP: (95-122)/(48-77) 113/66 (07/24 0447) SpO2:  [90 %-98 %] 93 % (07/24 0447)  Hemodynamic parameters for last 24 hours:    Intake/Output from previous day: 07/23 0701 - 07/24 0700 In: 470 [P.O.:370; IV Piggyback:100] Out: 2325 [Urine:2325] Intake/Output this shift: No intake/output data recorded.  General appearance: alert, cooperative, and no distress Heart: regular rate and rhythm Lungs: dim in bases, improving air exchange with less coarseness Abdomen: benign Extremities: no edema or calf tenderness Wound: incis healing well, cellulitis stable witout further spread left forearm  Lab Results: No results for input(s): WBC, HGB, HCT, PLT in the last 72 hours. BMET:  Recent Labs    02/27/24 0344  NA 136  K 4.1  CL 98  CO2 29  GLUCOSE 104*  BUN 10  CREATININE 0.82  CALCIUM  8.4*    PT/INR: No results for input(s): LABPROT, INR in the last 72 hours. ABG No results found for: PHART, HCO3, TCO2, ACIDBASEDEF, O2SAT CBG (last 3)  No results for input(s): GLUCAP in the last 72 hours.  Meds Scheduled Meds:  amiodarone   400 mg Oral BID   apixaban   5 mg Oral BID   arformoterol   15 mcg Nebulization BID   bisacodyl   10 mg Oral Daily   budesonide  (PULMICORT ) nebulizer solution  0.5 mg Nebulization BID   Chlorhexidine  Gluconate Cloth  6 each Topical Daily   feeding supplement  1 Container Oral TID BM   gabapentin   300 mg Oral BID   latanoprost   1 drop Both Eyes q morning   metoprolol  succinate  25 mg Oral Daily   pantoprazole   40 mg Oral Daily    revefenacin   175 mcg Nebulization Daily   senna-docusate  1 tablet Oral QHS   timolol   1 drop Both Eyes BID   Continuous Infusions:   ceFAZolin  (ANCEF ) IV 1 g (02/29/24 0509)   PRN Meds:.lactulose , levalbuterol , methocarbamol , ondansetron  (ZOFRAN ) IV, mouth rinse, oxyCODONE , sodium phosphate , sorbitol , traMADol   Xrays DG Chest 2 View Result Date: 02/28/2024 CLINICAL DATA:  Status post lobectomy. EXAM: CHEST - 2 VIEW COMPARISON:  02/27/2024 FINDINGS: Similar volume loss left hemithorax with left base airspace disease not substantially changed. Tiny left effusion again noted. Streaky opacity in the right apex and right base may be chronic atelectasis or scarring. Possible trace left apical pneumothorax superimposed on the posterior left third rib. The cardiopericardial silhouette is within normal limits for size. Telemetry leads overlie the chest. IMPRESSION: Similar volume loss left hemithorax with left base airspace disease and tiny left effusion. Possible trace left apical pneumothorax superimposed on the posterior left third rib. Electronically Signed   By: Camellia Candle M.D.   On: 02/28/2024 07:40   DG Chest 1V REPEAT Same Day Result Date: 02/27/2024 CLINICAL DATA:  Postoperative day 8 for left lower lobectomy, postoperative day 4 for removal of endobronchial valves. Chest tube removal. EXAM: CHEST - 1 VIEW SAME DAY COMPARISON:  02/27/2024 FINDINGS: The left-sided chest tube has been removed. Continued airspace opacity at the left lung base with blunting of the left lateral costophrenic angle. Equivocal 2% left apical pneumothorax. The pattern of calcification along the right apical  pleura is such that there is a small peripheral amount of aerated lung peripheral to the calcification, as on the CT scan from 01/08/2024. No right pneumothorax. Emphysema. Stable airspace opacity at the right lung base. Atherosclerotic calcification of the aortic arch. Stable left subcutaneous emphysema. IMPRESSION: 1. The  left-sided chest tube has been removed. Equivocal 2% left apical pneumothorax. 2. Continued airspace opacity at the left lung base with blunting of the left lateral costophrenic angle. 3. Stable airspace opacity at the right lung base. 4.  Emphysema (ICD10-J43.9). 5. Stable left subcutaneous emphysema. 6. Aortic Atherosclerosis (ICD10-I70.0). Electronically Signed   By: Ryan Salvage M.D.   On: 02/27/2024 13:19    Assessment/Plan: S/P Procedure(s) (LRB): VIDEO BRONCHOSCOPY WITHOUT FLUORO (N/A) BRONCHOSCOPY, FLEXIBLE, WITH INTRABRONCHIAL VALVE INSERTION (N/A)  1 afeb, VSS, sinus rhythm, on amio and eliquis  for post op afib 2 O2 sats ok on 4 liters- may need home O2 at d/c , has severe emphysema, cont nebs and pulm hygiene 3 voiding well 4 MG++ 1.9, will replace to get>2.0 5 conts ancef  for phlebitis>>>>cellulitis- poss transition to po abx soon 6 will need home PT/OT 7 likely home 1-2 days when less O2 need, and cellulitis improved, will repeat CXR in am  LOS: 10 days    Lemond FORBES Cera PA-C Pager [phone]  02/29/2024 Patient seen and examined, agree with findings and plan outlined above Wean O2 more aggressively Needs another day or two of IV antibiotics for cellulitis  Elspeth C. Kerrin, MD [facility] [phone]

## 2024-03-01 ENCOUNTER — Inpatient Hospital Stay (HOSPITAL_COMMUNITY)

## 2024-03-01 LAB — MAGNESIUM: Magnesium: 2.1 mg/dL (ref 1.7–2.4)

## 2024-03-01 MED ORDER — AMOXICILLIN-POT CLAVULANATE 875-125 MG PO TABS
1.0000 | ORAL_TABLET | Freq: Two times a day (BID) | ORAL | Status: DC
  Administered 2024-03-01 – 2024-03-03 (×5): 1 via ORAL
  Filled 2024-03-01 (×5): qty 1

## 2024-03-01 NOTE — Progress Notes (Addendum)
 7 Days Post-Op Procedure(s) (LRB): VIDEO BRONCHOSCOPY WITHOUT FLUORO (N/A) BRONCHOSCOPY, FLEXIBLE, WITH INTRABRONCHIAL VALVE INSERTION (N/A) Subjective: Feels a bit better  Objective: Vital signs in last 24 hours: Temp:  [97.9 F (36.6 C)-98.7 F (37.1 C)] 97.9 F (36.6 C) (07/25 0345) Pulse Rate:  [62-74] 62 (07/25 0345) Cardiac Rhythm: Normal sinus rhythm (07/25 0709) Resp:  [16-20] 18 (07/25 0345) BP: (91-121)/(52-75) 121/66 (07/25 0345) SpO2:  [86 %-95 %] 92 % (07/25 0345)  Hemodynamic parameters for last 24 hours:    Intake/Output from previous day: 07/24 0701 - 07/25 0700 In: -  Out: 3000 [Urine:3000] Intake/Output this shift: No intake/output data recorded.  General appearance: alert, cooperative, and no distress Heart: regular rate and rhythm Lungs: dim in left base, mostly clear , some crackles Abdomen: benign Extremities: no edema or calf tenderness Wound: incis healing well  Lab Results: No results for input(s): WBC, HGB, HCT, PLT in the last 72 hours. BMET: No results for input(s): NA, K, CL, CO2, GLUCOSE, BUN, CREATININE, CALCIUM  in the last 72 hours.  PT/INR: No results for input(s): LABPROT, INR in the last 72 hours. ABG No results found for: PHART, HCO3, TCO2, ACIDBASEDEF, O2SAT CBG (last 3)  No results for input(s): GLUCAP in the last 72 hours.  Meds Scheduled Meds:  amiodarone   400 mg Oral BID   apixaban   5 mg Oral BID   arformoterol   15 mcg Nebulization BID   bisacodyl   10 mg Oral Daily   budesonide  (PULMICORT ) nebulizer solution  0.5 mg Nebulization BID   Chlorhexidine  Gluconate Cloth  6 each Topical Daily   feeding supplement  1 Container Oral TID BM   gabapentin   300 mg Oral BID   latanoprost   1 drop Both Eyes q morning   magnesium  oxide  400 mg Oral Daily   metoprolol  succinate  25 mg Oral Daily   pantoprazole   40 mg Oral Daily   revefenacin   175 mcg Nebulization Daily   senna-docusate  1 tablet  Oral QHS   timolol   1 drop Both Eyes BID   Continuous Infusions:   ceFAZolin  (ANCEF ) IV 1 g (03/01/24 0528)   PRN Meds:.lactulose , levalbuterol , methocarbamol , ondansetron  (ZOFRAN ) IV, mouth rinse, oxyCODONE , sodium phosphate , sorbitol , traMADol   Xrays No results found.  Assessment/Plan: S/P Procedure(s) (LRB): VIDEO BRONCHOSCOPY WITHOUT FLUORO (N/A) BRONCHOSCOPY, FLEXIBLE, WITH INTRABRONCHIAL VALVE INSERTION (N/A)  1 afeb, VSS, sinus rhythm, post op afib- on amio and eliquis  2 sats ok on 4 liters, likely need home O2, hopefully can decrease amount 3 good UOP, Having BM's 4 cont ancef  for cellulitis from phlebitis 5 CXR - ASD in left base, poss small effus, right base significantly improved, sub q air signif improved, no pntx- may need broader spectrum abx if felt more of a pneumonia pattern 6 MG++ 2.1, on replacement 7 home care being arranged    LOS: 11 days    Lemond FORBES Cera PA-C Pager [phone] 03/01/2024 Patient seen and examined, agree with above Cellulitis improving- will complete course with 5 days of PO Augmentin , dc Ancef  Continue pulmonary Rx Home  when on less O2  Diksha Tagliaferro C. Kerrin, MD [facility] [phone]

## 2024-03-01 NOTE — Progress Notes (Signed)
 Occupational Therapy Treatment Patient Details Name: Brett [name] MRN: [id number] DOB: 15-Dec-1955 Today's Date: 03/01/2024   History of present illness 68 yo male admitted 02/19/24 for partial lobectomy of LLL. 7/18 post op air leak with bronchoscopy and endobronchial valve placement x2. 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   OT comments  Pt making good progress with functional goals. Pt very pleasant and cooperative. Pt educated on ADL A/E for LB selfcare with handouts and video demo provided, energy conservation strategies with handout provided. OT will continue to follow acutely to maximize level of function and safety      If plan is discharge home, recommend the following:  A little help with bathing/dressing/bathroom;Assistance with cooking/housework;Assist for transportation;Help with stairs or ramp for entrance   Equipment Recommendations  Other (comment) (ADL A/E kit)    Recommendations for Other Services      Precautions / Restrictions Precautions Precautions: Fall;Other (comment) Recall of Precautions/Restrictions: Impaired Precaution/Restrictions Comments: watch sats, baseline R hip and low back pain Restrictions Weight Bearing Restrictions Per Provider Order: No       Mobility Bed Mobility Overal bed mobility: Modified Independent       Supine to sit: Modified independent (Device/Increase time) Sit to supine: Modified independent (Device/Increase time)        Transfers                         Balance Overall balance assessment: Needs assistance Sitting-balance support: No upper extremity supported, Feet supported Sitting balance-Leahy Scale: Good                                     ADL either performed or assessed with clinical judgement   ADL Overall ADL's : Needs assistance/impaired             Lower Body Bathing: Minimal  assistance;Sitting/lateral leans;Sit to/from stand       Lower Body Dressing: Minimal assistance;Sitting/lateral leans;Sit to/from stand   Toilet Transfer: Contact guard assist;Ambulation;Rolling walker (2 wheels)           Functional mobility during ADLs: Contact guard assist;Rolling walker (2 wheels) General ADL Comments: pt educated on LB ADL A/E and energy conservation strategies with handout provided    Extremity/Trunk Assessment Upper Extremity Assessment Upper Extremity Assessment: Generalized weakness;Right hand dominant   Lower Extremity Assessment Lower Extremity Assessment: Defer to PT evaluation        Vision Ability to See in Adequate Light: 0 Adequate Patient Visual Report: No change from baseline     Perception     Praxis     Communication Communication Communication: No apparent difficulties   Cognition Arousal: Alert Behavior During Therapy: WFL for tasks assessed/performed Cognition: No apparent impairments                               Following commands: Intact        Cueing      Exercises      Shoulder Instructions       General Comments Continues to require 4L during activity    Pertinent Vitals/ Pain       Pain Assessment Pain Assessment: Faces Faces Pain Scale: Hurts a little bit Pain Location: L hip, back Pain Descriptors / Indicators: Sore, Discomfort Pain  Intervention(s): Monitored during session  Home Living                                          Prior Functioning/Environment              Frequency  Min 2X/week        Progress Toward Goals  OT Goals(current goals can now be found in the care plan section)  Progress towards OT goals: Progressing toward goals     Plan      Co-evaluation                 AM-PAC OT 6 Clicks Daily Activity     Outcome Measure   Help from another person eating meals?: None Help from another person taking care of personal  grooming?: A Little Help from another person toileting, which includes using toliet, bedpan, or urinal?: A Little Help from another person bathing (including washing, rinsing, drying)?: A Little Help from another person to put on and taking off regular upper body clothing?: A Little Help from another person to put on and taking off regular lower body clothing?: A Little 6 Click Score: 19    End of Session    OT Visit Diagnosis: Other abnormalities of gait and mobility (R26.89);Muscle weakness (generalized) (M62.81)   Activity Tolerance Patient tolerated treatment well   Patient Left in bed;with call bell/phone within reach   Nurse Communication Mobility status        Time: 8765-8740 OT Time Calculation (min): 25 min  Charges: OT General Charges $OT Visit: 1 Visit OT Treatments $Self Care/Home Management : 8-22 mins $Therapeutic Activity: 8-22 mins    Jacques Karna Loose 03/01/2024, 2:25 PM

## 2024-03-01 NOTE — Progress Notes (Signed)
 Physical Therapy Treatment Patient Details Name: Brett [name] MRN: [id number] DOB: [birth year] Today's Date: 03/01/2024   History of Present Illness 68 yo male admitted 02/19/24 for partial lobectomy of LLL. 7/18 post op air leak with bronchoscopy and endobronchial valve placement x2. 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    PT Comments  Pt continues to improve activity tolerance. Pt able to ambulate 600 ft today with stand up rollator. Pt is Mod I for bed mobility and sit to stand. Supervision for gait due to respiratory status.  Improved awareness of fatigue. Due to pt current functional status, home set up and available assistance at home recommending skilled physical therapy services 3x/week in order to address strength, balance and functional mobility to decrease risk for falls, injury and re-hospitalization.      If plan is discharge home, recommend the following: A little help with walking and/or transfers;Assistance with cooking/housework;Assist for transportation     Equipment Recommendations  None recommended by PT       Precautions / Restrictions Precautions Precautions: Fall;Other (comment) Recall of Precautions/Restrictions: Impaired Precaution/Restrictions Comments: watch sats, baseline R hip and low back pain Restrictions Weight Bearing Restrictions Per Provider Order: No     Mobility  Bed Mobility Overal bed mobility: Modified Independent       Supine to sit: Modified independent (Device/Increase time) Sit to supine: Modified independent (Device/Increase time)        Transfers Overall transfer level: Modified independent Equipment used: Rollator (4 wheels) Transfers: Sit to/from Stand Sit to Stand: Modified independent (Device/Increase time)           General transfer comment: upright rollator    Ambulation/Gait Ambulation/Gait assistance: Supervision Gait  Distance (Feet): 600 Feet Assistive device: Rollator (4 wheels) (upright rollator) Gait Pattern/deviations: Step-through pattern, Decreased stride length, Trunk flexed, Antalgic, Decreased stance time - left Gait velocity: slightly decreased Gait velocity interpretation: 1.31 - 2.62 ft/sec, indicative of limited community ambulator   General Gait Details: Pt continues to require 4L HFNC with exertion. Continues to require cues for upright standing. Stand up rollator helps with back/hip pain. Pt reports increased fatigue during last 25 ft of gait.     Balance Overall balance assessment: Needs assistance Sitting-balance support: No upper extremity supported, Feet supported Sitting balance-Leahy Scale: Good     Standing balance support: Reliant on assistive device for balance, During functional activity Standing balance-Leahy Scale: Fair Standing balance comment: Fair wtih rollator      Communication Communication Communication: No apparent difficulties  Cognition Arousal: Alert Behavior During Therapy: WFL for tasks assessed/performed   PT - Cognitive impairments: No apparent impairments       PT - Cognition Comments: Cooperative, motivated Following commands: Intact      Cueing Cueing Techniques: Verbal cues     General Comments General comments (skin integrity, edema, etc.): Continues to require 4L during activity      Pertinent Vitals/Pain Pain Assessment Pain Assessment: Faces Faces Pain Scale: Hurts a little bit Pain Location: back and L hip, chronic pain per pt, and L flank pain/residual pain from CT removal site Pain Descriptors / Indicators: Sore, Discomfort, Tender, Guarding Pain Intervention(s): Monitored during session, Limited activity within patient's tolerance           PT Goals (current goals can now be found in the care plan section) Acute Rehab PT Goals Patient Stated Goal: Return home, enjoy retirement PT Goal Formulation: With patient Time For  Goal Achievement: 03/11/24 Potential to Achieve Goals: Good Progress towards PT goals: Progressing toward goals    Frequency    Min 2X/week      PT Plan  Continue with current POC        AM-PAC PT 6 Clicks Mobility   Outcome Measure  Help needed turning from your back to your side while in a flat bed without using bedrails?: None Help needed moving from lying on your back to sitting on the side of a flat bed without using bedrails?: None Help needed moving to and from a bed to a chair (including a wheelchair)?: None Help needed standing up from a chair using your arms (e.g., wheelchair or bedside chair)?: None Help needed to walk in hospital room?: A Little Help needed climbing 3-5 steps with a railing? : A Little 6 Click Score: 22    End of Session Equipment Utilized During Treatment: Gait belt;Oxygen Activity Tolerance: Patient tolerated treatment well Patient left: in bed;with call bell/phone within reach Nurse Communication: Mobility status PT Visit Diagnosis: Other abnormalities of gait and mobility (R26.89);Difficulty in walking, not elsewhere classified (R26.2);Muscle weakness (generalized) (M62.81)     Time: 8859-8786 PT Time Calculation (min) (ACUTE ONLY): 33 min  Charges:    $Therapeutic Activity: 23-37 mins PT General Charges $$ ACUTE PT VISIT: 1 Visit                     Dorothyann Maier, DPT, CLT  Acute Rehabilitation Services Office: [phone] (Secure chat preferred)    Dorothyann VEAR Maier 03/01/2024, 12:22 PM

## 2024-03-02 LAB — MAGNESIUM: Magnesium: 2 mg/dL (ref 1.7–2.4)

## 2024-03-02 NOTE — Plan of Care (Signed)
   Problem: Education: Goal: Knowledge of General Education information will improve Description Including pain rating scale, medication(s)/side effects and non-pharmacologic comfort measures Outcome: Progressing   Problem: Health Behavior/Discharge Planning: Goal: Ability to manage health-related needs will improve Outcome: Progressing

## 2024-03-02 NOTE — Progress Notes (Addendum)
      [address]       [place] [address]             [phone]       8 Days Post-Op Procedure(s) (LRB): VIDEO BRONCHOSCOPY WITHOUT FLUORO (N/A) BRONCHOSCOPY, FLEXIBLE, WITH INTRABRONCHIAL VALVE INSERTION (N/A)  Subjective: Patient already walking in hallway this am. He really wants to go home.  Objective: Vital signs in last 24 hours: Temp:  [98.1 F (36.7 C)-98.9 F (37.2 C)] 98.1 F (36.7 C) (07/26 0730) Pulse Rate:  [55-90] 90 (07/26 0750) Cardiac Rhythm: Normal sinus rhythm (07/26 0707) Resp:  [16-20] 17 (07/26 0750) BP: (92-112)/(51-78) 112/57 (07/26 0730) SpO2:  [91 %-97 %] 96 % (07/26 0751)     Intake/Output from previous day: 07/25 0701 - 07/26 0700 In: 240 [P.O.:240] Out: 2425 [Urine:2425]   Physical Exam:  Cardiovascular: RRR Pulmonary: Clear to auscultation on the right and coarse on left  Abdomen: Soft, non tender, bowel sounds present. Extremities: No lower extremity edema. Wounds: Clean and dry.  No erythema or signs of infection.   Lab Results: CBC:No results for input(s): WBC, HGB, HCT, PLT in the last 72 hours. BMET: No results for input(s): NA, K, CL, CO2, GLUCOSE, BUN, CREATININE, CALCIUM  in the last 72 hours.  PT/INR: No results for input(s): LABPROT, INR in the last 72 hours. ABG:  INR: Will add last result for INR, ABG once components are confirmed Will add last 4 CBG results once components are confirmed  Assessment/Plan:  1. CV - Previous a fib. Maintaining SR. On Toprol  XL 25 mg daily and Amiodarone  400 mg bid and Apixaban  5 mg bid 2.  Pulmonary - On 3 liters of oxygen via Spray. No CXR ordered for today. Will check in am. Encourage incentive spirometer. 3. Cellulitis-Ancef  stopped yesterday. Continue Augmentin  for 5 days. 4. Disposition-will discharge when oxygen requirements are 2L or less  [name] ZimmermanPA-C 03/02/2024,8:02 AM   Chart reviewed, patient examined, agree with  above.  Just put down to 2L oxygen and sats 93%. Lungs clear. If he maintains sats today will plan home tomorrow.

## 2024-03-02 NOTE — Progress Notes (Signed)
 Mobility Specialist Progress Note:    03/02/24 0829  Mobility  Activity Ambulated with assistance in room;Ambulated with assistance in hallway  Level of Assistance Standby assist, set-up cues, supervision of patient - no hands on  Assistive Device Four wheel walker  Distance Ambulated (ft) 200 ft  Activity Response Tolerated well  Mobility Referral Yes  Mobility visit 1 Mobility  Mobility Specialist Start Time (ACUTE ONLY) [id number]  Mobility Specialist Stop Time (ACUTE ONLY) 0841  Mobility Specialist Time Calculation (min) (ACUTE ONLY) 12 min   Pt received in bed, agreeable to mobility session. Required SV with 5TT for safety. Unreliable pleth throughout. Limited by back pain. Returned pt to room, SpO2 94% on 3L. Pt eager for d/c. Tolerated well, asx throughout.    Brett [name] Mobility Specialist Please contact via Special educational needs teacher or  Rehab office at [phone]

## 2024-03-03 ENCOUNTER — Encounter: Payer: Self-pay | Admitting: Student in an Organized Health Care Education/Training Program

## 2024-03-03 ENCOUNTER — Inpatient Hospital Stay (HOSPITAL_COMMUNITY)

## 2024-03-03 LAB — MAGNESIUM: Magnesium: 2 mg/dL (ref 1.7–2.4)

## 2024-03-03 MED ORDER — OXYCODONE HCL 10 MG PO TABS: 10.0000 mg | ORAL_TABLET | Freq: Four times a day (QID) | ORAL | 0 refills | Status: AC | PRN

## 2024-03-03 MED ORDER — AMIODARONE HCL 200 MG PO TABS: ORAL_TABLET | ORAL | 1 refills | Status: DC

## 2024-03-03 MED ORDER — AMOXICILLIN-POT CLAVULANATE 875-125 MG PO TABS: 1.0000 | ORAL_TABLET | Freq: Two times a day (BID) | ORAL | 0 refills | Status: DC

## 2024-03-03 MED ORDER — METHOCARBAMOL 500 MG PO TABS: 500.0000 mg | ORAL_TABLET | Freq: Three times a day (TID) | ORAL | 0 refills | Status: DC | PRN

## 2024-03-03 MED ORDER — APIXABAN 5 MG PO TABS: 5.0000 mg | ORAL_TABLET | Freq: Two times a day (BID) | ORAL | 1 refills | Status: DC

## 2024-03-03 NOTE — TOC Transition Note (Addendum)
 Transition of Care [facility]) - Discharge Note   Patient Details  Name: Brett [name] MRN: [id number] Date of Birth: 1956/02/19  [facility] CM/SW Contact:  Robynn Eileen Hoose, RN Phone Number: 03/03/2024, 8:52 AM   Clinical Narrative:   Patient is being discharged today. Amy with [facility] made aware. Spoke with patient, confirmed that he has portable oxygen tank at bedside for travelling home with and the company also set up the oxygen concentrator in his home yesterday. Patient daughter will pick him up when he ready to leave.     Final next level of care: Home w Home Health Services Barriers to Discharge: No Barriers Identified   Patient Goals and CMS Choice Patient states their goals for this hospitalization and ongoing recovery are:: wants to get better          Discharge Placement                       Discharge Plan and Services Additional resources added to the After Visit Summary for     Discharge Planning Services: CM Consult            DME Arranged: Oxygen DME Agency: [employer] Date DME Agency Contacted: 02/29/24 Time DME Agency Contacted: 1217 Representative spoke with at DME Agency: [place] HH Arranged: OT, PT HH Agency: [facility] Date [facility] Agency Contacted: 02/28/24 Time HH Agency Contacted: 1444 Representative spoke with at [facility] Agency: Amy  Social Drivers of Health (SDOH) Interventions SDOH Screenings   Food Insecurity: No Food Insecurity (02/19/2024)  Housing: Low Risk  (02/19/2024)  Transportation Needs: No Transportation Needs (02/19/2024)  Utilities: Not At Risk (02/19/2024)  Alcohol Screen: Low Risk  (12/03/2023)  Depression (PHQ2-9): Low Risk  (01/16/2024)  Financial Resource Strain: Low Risk  (02/06/2024)  Recent Concern: Financial Resource Strain - Medium Risk (12/03/2023)  Physical Activity: Inactive (02/06/2024)  Social Connections: Moderately Integrated (02/19/2024)  Recent Concern: Social Connections - Moderately  Isolated (02/06/2024)  Stress: No Stress Concern Present (02/06/2024)  Tobacco Use: Medium Risk (02/19/2024)     Readmission Risk Interventions     No data to display

## 2024-03-03 NOTE — Plan of Care (Signed)

## 2024-03-03 NOTE — Progress Notes (Addendum)
      [address]       [place] [address]             [phone]       9 Days Post-Op Procedure(s) (LRB): VIDEO BRONCHOSCOPY WITHOUT FLUORO (N/A) BRONCHOSCOPY, FLEXIBLE, WITH INTRABRONCHIAL VALVE INSERTION (N/A)  Subjective: Patient without complaints this am. He hopes to go home now that he is on 2L via [place].  Objective: Vital signs in last 24 hours: Temp:  [97.8 F (36.6 C)-98.7 F (37.1 C)] 97.9 F (36.6 C) (07/27 0726) Pulse Rate:  [60-66] 65 (07/27 0726) Cardiac Rhythm: Normal sinus rhythm (07/27 0700) Resp:  [16-19] 18 (07/27 0726) BP: (107-125)/(58-86) 107/60 (07/27 0726) SpO2:  [91 %-93 %] 92 % (07/27 0726)     Intake/Output from previous day: 07/26 0701 - 07/27 0700 In: 480 [P.O.:480] Out: 4050 [Urine:4050]   Physical Exam:  Cardiovascular: RRR Pulmonary: Clear to auscultation on the right and mildly coarse on left  Abdomen: Soft, non tender, bowel sounds present. Extremities: No lower extremity edema. Wounds: Clean and dry.  No erythema or signs of infection.   Lab Results: CBC:No results for input(s): WBC, HGB, HCT, PLT in the last 72 hours. BMET: No results for input(s): NA, K, CL, CO2, GLUCOSE, BUN, CREATININE, CALCIUM  in the last 72 hours.  PT/INR: No results for input(s): LABPROT, INR in the last 72 hours. ABG:  INR: Will add last result for INR, ABG once components are confirmed Will add last 4 CBG results once components are confirmed  Assessment/Plan:  1. CV - Previous a fib. Maintaining SR. On Toprol  XL 25 mg daily and Amiodarone  400 mg bid and Apixaban  5 mg bid 2.  Pulmonary - On 2 liters of oxygen via [place]. PA/LAT CXR appears to show improving opacity left base. Encourage incentive spirometer. 3. Cellulitis from phlebitis-Previously treated with Ancef  and now on Augmentin  which will be continued for 5 days. 4. Disposition-discharge  [name] ZimmermanPA-C 03/03/2024,7:57 AM   Chart  reviewed, patient examined, agree with above.  Doing well on 2L [place]. Plan home today on oxygen.

## 2024-03-04 ENCOUNTER — Telehealth: Payer: Self-pay

## 2024-03-04 DIAGNOSIS — Z483 Aftercare following surgery for neoplasm: Secondary | ICD-10-CM | POA: Diagnosis not present

## 2024-03-04 NOTE — Transitions of Care (Post Inpatient/ED Visit) (Signed)
 03/04/2024  Name: Brett [name] MRN: [id number] DOB: 25-Dec-1955  Today's TOC FU Call Status: Today's TOC FU Call Status:: Successful TOC FU Call Completed TOC FU Call Complete Date: 03/04/24 Patient's Name and Date of Birth confirmed.  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 How have you been since you were released from the hospital?: Better Any questions or concerns?: No  Items Reviewed: Did you receive and understand the discharge instructions provided?: Yes Medications obtained,verified, and reconciled?: Yes (Medications Reviewed) Any new allergies since your discharge?: No Dietary orders reviewed?: NA Do you have support at home?: Yes People in Home [RPT]: child(ren), adult Name of Support/Comfort Primary Source: Brett [name]  Medications Reviewed Today: Medications Reviewed Today     Reviewed by Brett Reusing, RN (Case Manager) on 03/04/24 at 1403  Med List Status: <None>   Medication Order Taking? Sig Documenting Provider Last Dose Status Informant  amiodarone  (PACERONE ) 200 MG tablet [id number] Yes Take 200 mg by mouth two times daily for 7 days; then take 200 mg by mouth daily thereafter [name], Donielle [name], [name]  Active   amoxicillin -clavulanate (AUGMENTIN ) 875-125 MG tablet [id number] Yes Take 1 tablet by mouth every 12 (twelve) hours. Brett Aldo [name], [name]  Active   apixaban  (ELIQUIS ) 5 MG TABS tablet [id number] Yes Take 1 tablet (5 mg total) by mouth 2 (two) times daily. Brett [name]  Active   aspirin  EC 81 MG tablet [id number] Yes Take 1 tablet (81 mg total) by mouth daily. Brett [name]  Active Self  calcium  carbonate (TUMS EX) 750 MG chewable tablet [id number] Yes Chew 1-2 tablets by mouth 2 (two) times daily as needed for  heartburn. Provider, Historical, [name]  Active Self  empagliflozin  (JARDIANCE ) 10 MG TABS tablet [id number] Yes Take 1 tablet (10 mg total) by mouth daily before breakfast. Brett [name]  Active Self  methocarbamol  (ROBAXIN ) 500 MG tablet [id number] Yes Take 1 tablet (500 mg total) by mouth every 8 (eight) hours as needed for muscle spasms. Brett Aldo [name], [name]  Active   metoprolol  succinate (TOPROL  XL) 25 MG 24 hr tablet [id number] Yes Take 1 tablet (25 mg total) by mouth daily. Brett [name]  Active Self  Multiple Vitamin (MULTIVITAMIN WITH MINERALS) TABS tablet [id number] Yes Take 1 tablet by mouth in the morning. Provider, Historical, [name]  Active Self  oxyCODONE  10 MG TABS [id number] Yes Take 1 tablet (10 mg total) by mouth every 6 (six) hours as needed for up to 7 days for severe pain (pain score 7-10). Brett [name]  Active   STELARA 45 MG/0.5ML injection [id number] Yes Inject 45 mg into the skin as directed. Every 12 weeks Provider, Historical, [name]  Active Self           Med Note Brett [name]) Next dose due:02/15/24  Timolol  Maleate, Once-Daily, 0.5 % SOLN [id number] Yes Place 1 drop into both eyes in the morning and at bedtime. Provider, Historical, [name]  Active Self  TRAVATAN Z 0.004 % SOLN ophthalmic solution [id number] Yes Place 1 drop into both eyes every morning. Provider, Historical, [name]  Active Self           Med Note Brett [name])    varenicline  (CHANTIX   CONTINUING MONTH PAK) 1 MG tablet [id number]  Take 1 tablet (1 mg total) by mouth 2 (two) times daily.  Patient not taking: Reported on 03/04/2024   Brett [name]  Active Self            Home Care and Equipment/Supplies: Were Home Health Services Ordered?: Yes Name of Home Health Agency:: [facility] Has Agency set up a time to come to your home?: Yes First Home Health Visit Date: 03/04/24 Any new equipment or medical supplies  ordered?: Yes Name of Medical supply agency?: [facility] Were you able to get the equipment/medical supplies?: Yes Do you have any questions related to the use of the equipment/supplies?: No  Functional Questionnaire: Do you need assistance with bathing/showering or dressing?: No Do you need assistance with meal preparation?: No Do you need assistance with eating?: No Do you have difficulty maintaining continence: No Do you need assistance with getting out of bed/getting out of a chair/moving?: No Do you have difficulty managing or taking your medications?: No  Follow up appointments reviewed: PCP Follow-up appointment confirmed?: Yes Date of PCP follow-up appointment?: 03/13/24 Follow-up Provider: Setter Brett Specialist [facility] Follow-up appointment confirmed?: Yes Date of Specialist follow-up appointment?: 03/05/24 Follow-Up Specialty Provider:: Brett Millers Do you need transportation to your follow-up appointment?: No Do you understand care options if your condition(s) worsen?: Yes-patient verbalized understanding  SDOH Interventions Today    Flowsheet Row Most Recent Value  SDOH Interventions   Food Insecurity Interventions Intervention Not Indicated  Housing Interventions Intervention Not Indicated  Transportation Interventions Intervention Not Indicated  Utilities Interventions Intervention Not Indicated    Goals Addressed             This Visit's Progress    VBCI Transitions of Care (TOC) Care Plan       Problems:  Recent Hospitalization for treatment of Pulmonary Disease: Lung Cancer, Lobectomy  Goal:  Over the next 30 days, the patient will not experience hospital readmission  Interventions:   Surgery (02/19/24): Evaluation of current treatment plan related to 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 confirmed availability of transportation to all appointments :Yes. The patient's daughter will transport [facility] PT to start 03/04/24 for strengthening, increase stamina Continue with smoking cessation Wear Oxygen 2l/min continuous. Contact [facility] [phone] for portable oxygen concentrator test.   Patient Self Care Activities:  Attend all scheduled provider appointments Call pharmacy for medication refills 3-7 days in advance of running out of medications Call provider office for new concerns or questions  Notify RN Care Manager of [facility] call rescheduling needs Participate in Transition of Care Program/Attend [facility] scheduled calls Perform all self care activities independently  Take medications as prescribed    Plan:  Telephone follow up appointment with care management team member scheduled for:  Monday August 4th at 1:00pm        Medford Balboa, BSN, RN [place]  VBCI - [employer] [facility] RN Care Manager [phone]

## 2024-03-04 NOTE — Patient Instructions (Signed)
 Visit Information  Thank you for taking time to visit with me today. Please don't hesitate to contact me if I can be of assistance to you before our next scheduled telephone appointment.  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 confirmed availability of transportation to all appointments :Yes. The patient's daughter will transport [facility] to start 03/04/24 for strengthening, increase stamina Continue with smoking cessation Wear Oxygen 2l/min continuous. Contact [facility] [phone] for portable oxygen concentrator test.   Patient Self Care Activities:  Attend all scheduled provider appointments Call pharmacy for medication refills 3-7 days in advance of running out of medications Call provider office for new concerns or questions  Notify RN Care Manager of [facility] call rescheduling needs Participate in Transition of Care Program/Attend [facility] scheduled calls Perform all self care activities independently  Take medications as prescribed    Plan:  Telephone follow up appointment with care management team member scheduled for:  Monday August 4th at 1:00pm        Patient verbalizes understanding of instructions and care  plan provided today and agrees to view in MyChart. Active MyChart status and patient understanding of how to access instructions and care plan via MyChart confirmed with patient.     The patient has been provided with contact information for the care management team and has been advised to call with any health related questions or concerns.   Please call the care guide team at [phone] if you need to cancel or reschedule your appointment.   Please call the Suicide and Crisis Lifeline: 988 call the [place]  National Suicide Prevention Lifeline: (404)393-9555 or TTY: (505)139-3255 TTY 561 125 0902) to talk to a trained counselor if you are experiencing a Mental Health or Behavioral Health Crisis or need someone to talk to.  Medford Balboa, BSN, RN [place]  VBCI - [employer] [facility] RN Care Manager [phone]

## 2024-03-05 ENCOUNTER — Ambulatory Visit: Admitting: Thoracic Surgery (Cardiothoracic Vascular Surgery)

## 2024-03-07 ENCOUNTER — Other Ambulatory Visit: Payer: Self-pay

## 2024-03-07 NOTE — Progress Notes (Signed)
 The proposed treatment discussed in conference is for discussion purpose only and is not a binding recommendation.  The patients have not been physically examined, or presented with their treatment options.  Therefore, final treatment plans cannot be decided.

## 2024-03-08 ENCOUNTER — Other Ambulatory Visit: Payer: Self-pay | Admitting: Thoracic Surgery (Cardiothoracic Vascular Surgery)

## 2024-03-08 DIAGNOSIS — C3432 Malignant neoplasm of lower lobe, left bronchus or lung: Secondary | ICD-10-CM

## 2024-03-11 ENCOUNTER — Other Ambulatory Visit: Payer: Self-pay | Admitting: Thoracic Surgery (Cardiothoracic Vascular Surgery)

## 2024-03-11 ENCOUNTER — Other Ambulatory Visit: Payer: Self-pay

## 2024-03-11 DIAGNOSIS — R911 Solitary pulmonary nodule: Secondary | ICD-10-CM

## 2024-03-11 NOTE — Transitions of Care (Post Inpatient/ED Visit) (Signed)
 Transition of Care week 2  Visit Note  03/11/2024  Name: Brett [name] MRN: [id number]          DOB: March 05, 1956  Situation: Patient enrolled in [facility] 30-day program. Visit completed with Delontae Lofgren by telephone.   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 08/06/2023   Pain of left hip joint 11/01/2018   Paroxysmal SVT (supraventricular tachycardia) (HCC), incidental finding IntraOp back surgery requiring esmolol ; event monitor November 2024 for short runs, longest episode 17 beats. 06/19/2023   Event monitor November 2024, supraventricular ectopy burden 1.7%.  4 short runs of SVT, longest episode 17 beats.  No symptoms reported     Personal history of congenital hip dysplasia 05/30/2013   Preventative health care 01/29/2015   Rhinitis, allergic 09/22/2015   Rosacea 05/30/2013   Scoliosis deformity of spine 05/05/2020   Spinal stenosis of lumbar region 06/16/2023   Spondylolisthesis at L5-S1 level 09/14/2020   Tobacco abuse 05/30/2013    Assessment: Patient Reported  Symptoms: Cognitive Cognitive Status: Alert and oriented to person, place, and time      Neurological Neurological Review of Symptoms: No symptoms reported    HEENT HEENT Symptoms Reported: No symptoms reported      Cardiovascular Cardiovascular Symptoms Reported: No symptoms reported    Respiratory Respiratory Symptoms Reported: Other: Other Respiratory Symptoms: Cough; Hard to catch breath at times. I have to lay down. It is scary Respiratory Management Strategies: Adequate rest, Oxygen therapy, Routine screening, Activity, Coping strategies  Endocrine Endocrine Symptoms Reported: No symptoms reported Is patient diabetic?: No    Gastrointestinal Gastrointestinal Symptoms Reported: No symptoms reported      Genitourinary Genitourinary Symptoms Reported: No symptoms reported    Integumentary Integumentary Symptoms Reported: Incision Additional Integumentary Details: Four small incisions. One has stitches Skin Management Strategies: Routine screening, Adequate rest  Musculoskeletal Musculoskelatal Symptoms Reviewed: Back pain, Weakness Musculoskeletal Management Strategies: Medication therapy, Routine screening, Exercise, Adequate rest Musculoskeletal Comment: The patient has chronic back pain. He is currently working with HHPT      Psychosocial Psychosocial Symptoms Reported: No symptoms reported         There were no vitals filed for this visit.  Medications Reviewed Today     Reviewed by [name], RN (Case Manager) on 03/11/24 at 1307  Med List Status: <None>   Medication Order Taking? Sig Documenting Provider Last Dose Status Informant  amiodarone  (PACERONE ) 200 MG tablet [id number]  Take 200 mg by mouth two times daily for 7 days; then take 200 mg by mouth daily  thereafter [name], [name], PA-C  Active   amoxicillin -clavulanate (AUGMENTIN ) 875-125 MG tablet [id number]  Take 1 tablet by mouth every 12 (twelve) hours.  Patient not taking: Reported on  03/11/2024   [name] M, PA-C  Active   apixaban  (ELIQUIS ) 5 MG TABS tablet [id number]  Take 1 tablet (5 mg total) by mouth 2 (two) times daily. [name] M, PA-C  Active   aspirin  EC 81 MG tablet [id number]  Take 1 tablet (81 mg total) by mouth daily. Swallow whole. [name], [name], MD  Active Self  calcium  carbonate (TUMS EX) 750 MG chewable tablet [id number]  Chew 1-2 tablets by mouth 2 (two) times daily as needed for heartburn. [provider]  Active Self  empagliflozin  (JARDIANCE ) 10 MG TABS tablet [id number]  Take 1 tablet (10 mg total) by mouth daily before breakfast. [name], [name], MD  Active Self  methocarbamol  (ROBAXIN ) 500 MG tablet [id number]  Take 1 tablet (500 mg total) by mouth every 8 (eight) hours as needed for muscle spasms. [name] M, PA-C  Active   metoprolol  succinate (TOPROL  XL) 25 MG 24 hr tablet [id number]  Take 1 tablet (25 mg total) by mouth daily. [name], [name], MD  Active Self  Multiple Vitamin (MULTIVITAMIN WITH MINERALS) TABS tablet [id number]  Take 1 tablet by mouth in the morning. [provider]  Active Self  Oxycodone  HCl 10 MG TABS [id number] Yes Take 1 tablet by mouth every 6 (six) hours as needed. [provider]  Active   OXYGEN [id number]  Inhale 2 L/min into the lungs continuous. [provider]  Active   STELARA 45 MG/0.5ML injection [id number]  Inject 45 mg into the skin as directed. Every 12 weeks [provider]  Active Self           Med Note CLAUD, [name] Feb 08, 2024 10:34 AM) Next dose due:02/15/24  Timolol  Maleate, Once-Daily, 0.5 % SOLN [id number]  Place 1 drop into both eyes in the morning and at bedtime. [provider]  Active Self  TRAVATAN Z 0.004 % SOLN ophthalmic solution [id number]  Place 1 drop into both eyes every morning. [provider]  Active Self           Med Note JERALYN DUNCANS A   Thu Sep 03, 2020  2:43 PM)     varenicline  (CHANTIX  CONTINUING MONTH PAK) 1 MG tablet [id number]  Take 1 tablet (1 mg total) by mouth 2 (two) times daily.  Patient not taking: Reported on 03/04/2024   [name], NP  Active Self            Recommendation:   Continue Current Plan of Care  Follow Up Plan:   Telephone follow-up in 1 week  Medford Balboa, BSN, RN [place]  VBCI - [facility] RN Care Manager [phone]

## 2024-03-11 NOTE — Patient Instructions (Signed)
 Visit Information  Thank you for taking time to visit with me today. Please don't hesitate to contact me if I can be of assistance to you before our next scheduled telephone appointment.  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 - rescheduled to 03/12/24 confirmed availability of transportation to all appointments :Yes. The patient's daughter will transport [facility] PT to start 03/04/24 for strengthening, increase stamina Continue with smoking cessation Wear Oxygen 2l/min continuous. Contact [facility] [phone] for portable oxygen concentrator test.   Patient Self Care Activities:  Attend all scheduled provider appointments Call pharmacy for medication refills 3-7 days in advance of running out of medications Call provider office for new concerns or questions  Notify RN Care Manager of [facility] call rescheduling needs Participate in Transition of Care Program/Attend [facility] scheduled calls Perform all self care activities independently  Take medications as prescribed    Plan:  Telephone follow up appointment with care management team member scheduled for:  Monday August 11th at  1:00pm        Patient verbalizes understanding of instructions and care plan provided today and agrees to view in MyChart. Active MyChart status and patient understanding of how to access instructions and care plan via MyChart confirmed with patient.     The patient has been provided with contact information for the care management team and has been advised to call with any health related questions or concerns.   Please call the care guide team at [phone] if you need to cancel or reschedule your appointment.   Please call the Suicide and Crisis Lifeline: 988 call the [place]  National Suicide Prevention Lifeline: 561-796-4278 or TTY: 225-837-1466 TTY 561 242 4646) to talk to a trained counselor if you are experiencing a Mental Health or Behavioral Health Crisis or need someone to talk to.

## 2024-03-12 ENCOUNTER — Ambulatory Visit
Payer: Self-pay | Attending: Thoracic Surgery (Cardiothoracic Vascular Surgery) | Admitting: Thoracic Surgery (Cardiothoracic Vascular Surgery)

## 2024-03-12 ENCOUNTER — Encounter: Payer: Self-pay | Admitting: Thoracic Surgery (Cardiothoracic Vascular Surgery)

## 2024-03-12 ENCOUNTER — Ambulatory Visit (HOSPITAL_COMMUNITY)
Admission: RE | Admit: 2024-03-12 | Discharge: 2024-03-12 | Disposition: A | Discharge status: Home / Self Care | Source: Ambulatory Visit | Attending: Cardiology | Admitting: Cardiology

## 2024-03-12 ENCOUNTER — Encounter (HOSPITAL_COMMUNITY): Payer: Self-pay

## 2024-03-12 VITALS — BP 140/77 | HR 73 | Resp 20 | Ht 74.0 in | Wt 173.0 lb

## 2024-03-12 DIAGNOSIS — J929 Pleural plaque without asbestos: Secondary | ICD-10-CM | POA: Diagnosis not present

## 2024-03-12 DIAGNOSIS — R911 Solitary pulmonary nodule: Secondary | ICD-10-CM | POA: Diagnosis not present

## 2024-03-12 DIAGNOSIS — Z48813 Encounter for surgical aftercare following surgery on the respiratory system: Secondary | ICD-10-CM | POA: Diagnosis not present

## 2024-03-12 DIAGNOSIS — J439 Emphysema, unspecified: Secondary | ICD-10-CM | POA: Insufficient documentation

## 2024-03-12 DIAGNOSIS — R918 Other nonspecific abnormal finding of lung field: Secondary | ICD-10-CM | POA: Diagnosis not present

## 2024-03-12 DIAGNOSIS — C3432 Malignant neoplasm of lower lobe, left bronchus or lung: Secondary | ICD-10-CM

## 2024-03-12 DIAGNOSIS — I700 Atherosclerosis of aorta: Secondary | ICD-10-CM | POA: Diagnosis not present

## 2024-03-12 DIAGNOSIS — Z09 Encounter for follow-up examination after completed treatment for conditions other than malignant neoplasm: Secondary | ICD-10-CM

## 2024-03-12 NOTE — Progress Notes (Signed)
 [address]       [place] [name] [address]             [phone]      HPI: Brett [name] returns for follow-up after recent lobectomy.  Brett [name] is a 68 year old man with a complicated medical history including long-term tobacco abuse, COPD, lung cancer, paroxysmal SVT, nonsustained ventricular tachycardia, scoliosis, multiple hip and back surgeries, chronic narcotic dependent pain, cervical disc disease, glaucoma, and rosacea.  He had a cardiac stress PET scan and was found to have a new left lower lobe lung nodule.  On follow-up the nodule is increased in size.  Repeat scan later showed it increased in size to 16 x 9 mm and was hypermetabolic with SUV of 6.2.  He underwent robotic assisted left lower lobectomy on 02/19/2024.  Complicated initially by a large air leak.  I placed IBV on 02/23/2024 but he developed refractory hypoxia and complete atelectasis of the left lung.  We put him right back to sleep and removed the IBVs.  Interestingly his airleak stopped and we were able to get his tube out a few days later.  He did develop some left lower lobe atelectasis.  Treated with antibiotics.  Discharged home on oxygen.  He complains of pain.  He is only taking oxycodone .  He does have Robaxin  and is taken a few of those.  He refused gabapentin  and says that he gets stomach upset with Tylenol .  Past Medical History:  Diagnosis Date   Allergy    Arthritis    Asthma    Bilateral low back pain without sciatica 09/22/2015   Blood transfusion without reported diagnosis    Cancer (HCC)    Left Lower Lobe Adenocarcinoma   Cardiomyopathy (HCC)    Cervical neck pain with evidence of disc disease 09/22/2015   Chicken pox    Congenital hip deformity    Corn of foot 08/09/2022   Degenerative disc disease, cervical 01/29/2015   Dysrhythmia    SVT   Eczema 05/30/2013   Erectile dysfunction 09/22/2015   Glaucoma 05/30/2013   History of cardiac dysrhythmia 05/23/2023    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 08/06/2023   Pain of left hip joint 11/01/2018   Paroxysmal SVT (supraventricular tachycardia) (HCC), incidental finding IntraOp back surgery requiring esmolol ; event monitor November 2024 for short runs, longest episode 17 beats. 06/19/2023   Event monitor November 2024, supraventricular ectopy burden 1.7%.  4 short runs of SVT, longest episode 17 beats.  No symptoms reported     Personal history of congenital hip dysplasia 05/30/2013   Preventative health care 01/29/2015   Rhinitis, allergic 09/22/2015   Rosacea 05/30/2013   Scoliosis deformity of spine 05/05/2020   Spinal stenosis of lumbar region 06/16/2023   Spondylolisthesis at L5-S1 level 09/14/2020   Tobacco abuse 05/30/2013    Current Outpatient Medications  Medication Sig Dispense Refill   amiodarone  (PACERONE ) 200 MG tablet Take 200 mg by mouth two times daily for 7 days; then take 200 mg by mouth daily thereafter 60 tablet 1   amoxicillin -clavulanate (AUGMENTIN ) 875-125 MG tablet Take 1 tablet by mouth every 12 (twelve) hours. (Patient not taking: Reported on 03/11/2024) 5 tablet  0   apixaban  (ELIQUIS ) 5 MG TABS tablet Take 1 tablet (5 mg total) by mouth 2 (two) times daily. 60 tablet 1   aspirin  EC 81 MG tablet Take 1 tablet (81 mg total) by mouth daily. Swallow whole.     calcium  carbonate (TUMS EX) 750 MG chewable tablet Chew 1-2 tablets by mouth 2 (two) times daily as needed for heartburn.     empagliflozin  (JARDIANCE ) 10 MG TABS tablet Take 1 tablet (10 mg total) by mouth daily before breakfast. 90 tablet 1   methocarbamol  (ROBAXIN ) 500 MG tablet Take 1 tablet (500 mg total) by  mouth every 8 (eight) hours as needed for muscle spasms. 21 tablet 0   metoprolol  succinate (TOPROL  XL) 25 MG 24 hr tablet Take 1 tablet (25 mg total) by mouth daily. 90 tablet 3   Multiple Vitamin (MULTIVITAMIN WITH MINERALS) TABS tablet Take 1 tablet by mouth in the morning.     Oxycodone  HCl 10 MG TABS Take 1 tablet by mouth every 6 (six) hours as needed.     OXYGEN Inhale 2 L/min into the lungs continuous.     STELARA 45 MG/0.5ML injection Inject 45 mg into the skin as directed. Every 12 weeks     Timolol  Maleate, Once-Daily, 0.5 % SOLN Place 1 drop into both eyes in the morning and at bedtime.     TRAVATAN Z 0.004 % SOLN ophthalmic solution Place 1 drop into both eyes every morning.  1   varenicline  (CHANTIX  CONTINUING MONTH PAK) 1 MG tablet Take 1 tablet (1 mg total) by mouth 2 (two) times daily. (Patient not taking: Reported on 03/04/2024) 60 tablet 1   No current facility-administered medications for this visit.    Physical Exam BP (!) 140/77   Pulse 73   Resp 20   Ht 6' 2 (1.88 m)   Wt 173 lb (78.5 kg)   SpO2 92% Comment: 4L O2 per [place]  BMI 22.1 kg/m  68 year old man in no acute distress, but obviously uncomfortable Lungs crackles at left base Incisions well-healed Cardiac regular rate and rhythm  Diagnostic Tests: I personally reviewed the chest x-ray images.  Persistent opacity at the left base.  Slightly improved from his previous film.  Impression: Brett [name] is a 68 year old man with a complicated medical history including long-term tobacco abuse, COPD, lung cancer, paroxysmal SVT, nonsustained ventricular tachycardia, scoliosis, multiple hip and back surgeries, chronic narcotic dependent pain, cervical disc disease, glaucoma, and rosacea.  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  Has an appointment with Dr. Sherrod later this week.  I would suspect just observation for now.  Some persistent opacity in the left  lower lobe.  He has completed his antibiotics.  He is not having any fevers so I do not think there is any need for additional antibiotics.  Could consider steroids but not quite ready to take that step yet.  Informed that radiographic changes take longer to result in clinical problems.  Still using home oxygen.  Hopefully will be able to wean off that as the lung heals.  Pain control-significant issue and has been for him for a long time.  He has been on oxycodone  chronically.  Prescribed by pain clinic at [facility].  He is running low on his oxycodone .  I instructed him to call them for refill so that he does not have multiple providers providing narcotics.  I had a long discussion with him that  other medications such as gabapentin , acetaminophen , ibuprofen , and Robaxin  can be adjuncts to the narcotics.  He He thought my suggestion for those medications was instead of the oxycodone  which he is absolutely convinced that would not work.  I tried to very carefully explained that using those medications and low doses can help when used in addition to the narcotics.  Recommend that he take either one 325 mg acetaminophen  3 times a day or one 200 mg ibuprofen  tablet 3 times a day in addition to continuing to use the oxycodone  as needed.  Plan: Follow-up with Dr. Sherrod as scheduled Return in 1 month with PA and lateral chest x-ray to check on progress  Elspeth JAYSON Millers, MD [facility] [phone]

## 2024-03-13 ENCOUNTER — Other Ambulatory Visit (HOSPITAL_BASED_OUTPATIENT_CLINIC_OR_DEPARTMENT_OTHER): Payer: Self-pay

## 2024-03-13 ENCOUNTER — Encounter: Payer: Self-pay | Admitting: Family

## 2024-03-13 ENCOUNTER — Ambulatory Visit: Admitting: Family

## 2024-03-13 VITALS — BP 126/59 | HR 66 | Temp 98.0°F | Resp 16 | Ht 74.0 in | Wt 169.0 lb

## 2024-03-13 DIAGNOSIS — R609 Edema, unspecified: Secondary | ICD-10-CM | POA: Diagnosis not present

## 2024-03-13 DIAGNOSIS — N481 Balanitis: Secondary | ICD-10-CM

## 2024-03-13 DIAGNOSIS — Z87891 Personal history of nicotine dependence: Secondary | ICD-10-CM

## 2024-03-13 DIAGNOSIS — R9389 Abnormal findings on diagnostic imaging of other specified body structures: Secondary | ICD-10-CM | POA: Diagnosis not present

## 2024-03-13 DIAGNOSIS — I429 Cardiomyopathy, unspecified: Secondary | ICD-10-CM

## 2024-03-13 DIAGNOSIS — C3492 Malignant neoplasm of unspecified part of left bronchus or lung: Secondary | ICD-10-CM | POA: Diagnosis not present

## 2024-03-13 DIAGNOSIS — M48061 Spinal stenosis, lumbar region without neurogenic claudication: Secondary | ICD-10-CM

## 2024-03-13 DIAGNOSIS — C3432 Malignant neoplasm of lower lobe, left bronchus or lung: Secondary | ICD-10-CM

## 2024-03-13 MED ORDER — FUROSEMIDE 20 MG PO TABS
20.0000 mg | ORAL_TABLET | Freq: Every day | ORAL | 0 refills | Status: DC | PRN
  Filled 2024-03-13: qty 30, 30d supply, fill #0

## 2024-03-13 MED ORDER — CLOTRIMAZOLE 1 % EX OINT
TOPICAL_OINTMENT | CUTANEOUS | 0 refills | Status: AC
  Filled 2024-03-13: qty 56.7, fill #0

## 2024-03-13 MED ORDER — DOXYCYCLINE HYCLATE 100 MG PO TABS
100.0000 mg | ORAL_TABLET | Freq: Two times a day (BID) | ORAL | 0 refills | Status: DC
  Filled 2024-03-13: qty 14, 7d supply, fill #0

## 2024-03-13 NOTE — Assessment & Plan Note (Signed)
 He is following with [facility] for pain management and plans to schedule a follow up visit.

## 2024-03-13 NOTE — Assessment & Plan Note (Signed)
 Reports groin rash/erythema at the tip of his penis. Likely due to jardiance . Rx provided for clotrimazole  cream. Reinforced hygiene measures to reduce chance of recurrence.

## 2024-03-13 NOTE — Assessment & Plan Note (Signed)
 Quit 5/25.

## 2024-03-13 NOTE — Patient Instructions (Signed)
 VISIT SUMMARY:  You had a follow-up visit after your recent hospital stay for a robotic-assisted right lower lobectomy due to non-small cell carcinoma. You are currently on oxygen therapy and experiencing several symptoms including night sweats, pedal edema, and nasal congestion. We discussed your ongoing treatment and made some adjustments to your medications.  YOUR PLAN:  NON-SMALL CELL LUNG CARCINOMA, STATUS POST RIGHT LOWER LOBECTOMY: You had surgery to remove part of your lung due to cancer, and the margins were clear with no lymph node involvement. -Continue using oxygen therapy at 4 liters as needed. -Follow up with oncology to determine if further treatment is needed.  POSTOPERATIVE PULMONARY INFLAMMATION AND EFFUSION, LEFT LUNG: You have inflammation and fluid in your left lung, which might be causing your night sweats. -Pick up the prescribed antibiotics from the pharmacy to treat a potential infection.  NIGHT SWEATS, UNDER EVALUATION: You are experiencing severe night sweats, which could be due to an infection or another cause. -Take the prescribed antibiotics to see if they help with the night sweats. -Monitor for improvement in night sweats over the next 7-10 days.  ATRIAL FIBRILLATION: You have a history of irregular heartbeats, currently managed with metoprolol . -Continue taking metoprolol  for heart rate control. -Follow up with cardiology on 04/29/2024.  CHRONIC HEART FAILURE: You have chronic heart failure, and your medication was adjusted due to low blood pressure. -Continue taking Jardiance  for heart failure management. -Discuss the possibility of resuming Entresto  with your cardiologist at your upcoming appointment with cardiology. -Monitor your weight regularly. Let us  know if you gain 3 pounds overnight of 5 pounds in 1 week.   LOWER EXTREMITY EDEMA: You have swelling in your legs, likely due to your recent hospitalization and reduced mobility. -Take furosemide  once  daily for 2-3 days, then as needed for swelling.  INTERTRIGO LIKELY DUE TO Jardiance : You have a rash in your groin area, likely due to your medication Jardiance . -Apply antifungal cream (Monistat) to the affected area twice daily until the rash resolves. -Maintain good hygiene practices, including blotting dry after urination, to prevent recurrence.  CHRONIC BACK PAIN, OPIOID MANAGED: You have chronic back pain managed with opioids, and there were recent issues with your prescription. -Ensure communication with your healthcare providers regarding your hospitalization and current medication needs.  SINUS CONGESTION: You have nasal congestion, likely due to oxygen therapy and dry air. -Use nasal saline spray and Aquaphor for nasal dryness. -Monitor for increased pain or fever.

## 2024-03-13 NOTE — Assessment & Plan Note (Signed)
 X-ray from yesterday notes the following:   Unchanged dense airspace consolidation filling the left mid and lower lung zones with blunting of the left costophrenic sulcus, possibly scarring or small effusion.  Due to c/o new night sweats, I am concerned about the possibility of pneumonia. Will rx with doxycycline . Check labs as ordered. Let me know if night sweats do not resolve in the next 1 week. Keep follow up appointment with thoracic surgery.

## 2024-03-13 NOTE — Assessment & Plan Note (Signed)
 New. Trial of lasix . Recommend once daily for 2-3 days and then PRN.

## 2024-03-13 NOTE — Progress Notes (Signed)
 Subjective:     Patient ID: Brett [name], male    DOB: 06-Dec-1955, 68 y.o.   MRN: [id number]  Chief Complaint  Patient presents with   Hospitalization Follow-up    HPI  Discussed the use of AI scribe software for clinical note transcription with the patient, who gave verbal consent to proceed.  History of Present Illness   Brett [name] is a 68 year old male who presents for a hospital follow-up after a robotic-assisted right lower lobectomy for non-small cell carcinoma. He is accompanied by his daughter, who is staying with him.  He was hospitalized from July 14 to March 03, 2024, for a robotic-assisted right lower lobectomy and lymph node dissection. Postoperative complications included a large air leak requiring bronchial valves, BiPAP, and oxygen support, and recurrent atrial fibrillation managed with IV amiodarone . 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 All lymph nodes were negative.  Since discharge, he remains on 4 liters of oxygen via nasal cannula. He reports oxygen desaturation occurs with ambulation, and his pulse rate increases to 134 bpm with minimal exertion. He experiences pedal edema and severe night sweats, waking up cold and soaked.  Current medications include Jardiance , Eliquis , and metoprolol . Entresto  was discontinued due to hypotension. He reports a groin rash, using Neosporin for relief.  He has nasal congestion and uses nasal saline spray. He receives occupational and physical therapy at home twice weekly.  RADIOLOGY Chest X-ray: Left lower and left middle lung with fluid and inflammation. (03/12/2024)  PATHOLOGY Poorly differentiated non-small cell carcinoma. Separate focus of adenocarcinoma in situ, with negative margins. Eleven lymph nodes negative. (03/03/2024)  Health Maintenance Due  Topic Date Due   COVID-19 Vaccine (8 - Pfizer risk 2024-25 season) 10/26/2023    Medicare Annual Wellness (AWV)  02/16/2024   INFLUENZA VACCINE  03/08/2024    Past Medical History:  Diagnosis Date   Allergy    Arthritis    Asthma    Bilateral low back pain without sciatica 09/22/2015   Blood transfusion without reported diagnosis    Cancer (HCC)    Left Lower Lobe Adenocarcinoma   Cardiomyopathy (HCC)    Cervical neck pain with evidence of disc disease 09/22/2015   Chicken pox    Congenital hip deformity    Corn of foot 08/09/2022   Degenerative disc disease, cervical 01/29/2015   Dysrhythmia    SVT   Eczema 05/30/2013   Erectile dysfunction 09/22/2015   Glaucoma 05/30/2013   History of cardiac dysrhythmia 05/23/2023   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 08/06/2023   Pain of left hip joint 11/01/2018   Paroxysmal SVT (supraventricular tachycardia) (HCC), incidental finding IntraOp back surgery requiring esmolol ; event monitor November 2024 for short runs, longest episode 17 beats. 06/19/2023   Event monitor November 2024, supraventricular ectopy burden 1.7%.  4 short runs of SVT, longest episode 17 beats.  No symptoms reported     Personal history of congenital hip dysplasia 05/30/2013   Preventative health care 01/29/2015   Rhinitis, allergic 09/22/2015   Rosacea 05/30/2013   Scoliosis deformity of spine 05/05/2020   Spinal stenosis of lumbar region 06/16/2023   Spondylolisthesis at L5-S1 level 09/14/2020  Tobacco abuse 05/30/2013    Past Surgical History:  Procedure Laterality Date   ANTERIOR LAT LUMBAR FUSION Right 05/09/2023   Procedure: Extreme Lateral Interbody Fusion Lumbar three-four -right;  Surgeon: Louis Shove, MD;  Location:  [facility] OR;  Service: Neurosurgery;  Laterality: Right;  3C   BRONCHOSCOPY, WITH BIOPSY USING ELECTROMAGNETIC NAVIGATION Bilateral 01/09/2024   Procedure: ROBOTIC ASSISTED NAVIGATIONAL BRONCHOSCOPY;  Surgeon: Isadora Hose, MD;  Location: [facility] ORS;  Service: Pulmonary;  Laterality: Bilateral;   COLONOSCOPY  06/17/2015   Pyrtle   COLONOSCOPY  07/20/2020   Pyrtle   COLONOSCOPY  02/15/2024   ENDOBRONCHIAL ULTRASOUND Bilateral 01/09/2024   Procedure: ENDOBRONCHIAL ULTRASOUND (EBUS);  Surgeon: Isadora Hose, MD;  Location: [facility] ORS;  Service: Pulmonary;  Laterality: Bilateral;   HAMMER TOE SURGERY  07/28/2011   Procedure: HAMMER TOE CORRECTION;  Surgeon: Toribio JULIANNA Chancy, MD;  Location: [facility];  Service: Orthopedics;  Laterality: Right;  right 2nd and 4th toes correction hammer toe, capsulotomy metatarsal-phalangeal joints   HERNIA REPAIR Bilateral    HIP SURGERY  1986 & 2010   rt total hip-8/10-multiple rt hip surgeries- had 4 prior to 1986 as a child   INTERCOSTAL NERVE BLOCK Left 02/19/2024   Procedure: BLOCK, NERVE, INTERCOSTAL;  Surgeon: Kerrin Elspeth BROCKS, MD;  Location: [facility] OR;  Service: Thoracic;  Laterality: Left;   JOINT REPLACEMENT  2010   Partial Right Hip   LOBECTOMY, LUNG, ROBOT-ASSISTED, USING VATS Left 02/19/2024   Procedure: LOBECTOMY, LUNG, ROBOT-ASSISTED, USING VATS;  Surgeon: Kerrin Elspeth BROCKS, MD;  Location: [facility] OR;  Service: Thoracic;  Laterality: Left;  ROBOTIC LEFT LOWER LOBECTOMY   lumber fusion  09/2020   POLYPECTOMY     SENTINEL NODE BIOPSY Left 02/19/2024   Procedure: BIOPSY, LYMPH NODE;  Surgeon: Kerrin Elspeth BROCKS, MD;  Location: [facility] OR;  Service: Thoracic;  Laterality: Left;   SPINE SURGERY  09/14/2020   THUMB ARTHROSCOPY  2008   rt   TOTAL HIP ARTHROPLASTY Right 1985   VIDEO BRONCHOSCOPY N/A 02/23/2024   Procedure: VIDEO BRONCHOSCOPY WITHOUT FLUORO;  Surgeon: Kerrin Elspeth BROCKS, MD;  Location: [facility] ENDOSCOPY;  Service: Thoracic;  Laterality:  N/A;  Bronch with IBV insertion   VIDEO BRONCHOSCOPY WITH INSERTION OF INTERBRONCHIAL VALVE (IBV) N/A 02/23/2024   Procedure: BRONCHOSCOPY, FLEXIBLE, WITH INTRABRONCHIAL VALVE INSERTION;  Surgeon: Kerrin Elspeth BROCKS, MD;  Location: [facility] ENDOSCOPY;  Service: Thoracic;  Laterality: N/A;    Family History  Problem Relation Age of Onset   Cancer Mother 54       history of colon cancer   Rosacea Mother    Colon cancer Mother 23   Colon polyps Mother    Rosacea Father    Lung disease Father        ?pulmonary fibrosis   Alcohol abuse Brother    Depression Brother    Colon cancer Maternal Grandmother    Endocrine tumor Daughter        pituitary tumor, POTTS   Thyroid  disease Son        ?hyperthyroid   Cancer Cousin        colon   Colon cancer Cousin    Esophageal cancer Neg Hx    Rectal cancer Neg Hx    Stomach cancer Neg Hx     Social History   Socioeconomic History   Marital status: Widowed    Spouse name: Not on file   Number of children: Not on file   Years of education: Not on file  Highest education level: Associate degree: occupational, Scientist, product/process development, or vocational program  Occupational History   Not on file  Tobacco Use   Smoking status: Former    Current packs/day: 0.00    Average packs/day: 0.5 packs/day for 50.3 years (25.2 ttl pk-yrs)    Types: Cigarettes    Start date: 21    Quit date: 12/11/2023    Years since quitting: 0.2   Smokeless tobacco: Never   Tobacco comments:    Started smoking at 68 years old.    Smoked 1 PPD at his heaviest.    Quit smoking on 12/11/2023  Vaping Use   Vaping status: Never Used  Substance and Sexual Activity   Alcohol use: Yes    Alcohol/week: 14.0 standard drinks of alcohol    Types: 14 Standard drinks or equivalent per week   Drug use: No   Sexual activity: Not Currently    Birth control/protection: None  Other Topics Concern   Not on file  Social History Narrative   Quality control Tech- gauges/callibrations.  (Retired)    Some college/tech school   wife passed   3 grown children (oldest daughter is living with them) youngest daughter lives in [place].  Curator at [employer].  Son lives near [place].   Social Drivers of Corporate investment banker Strain: Low Risk  (02/06/2024)   Overall Financial Resource Strain (CARDIA)    Difficulty of Paying Living Expenses: Not hard at all  Recent Concern: Financial Resource Strain - Medium Risk (12/03/2023)   Overall Financial Resource Strain (CARDIA)    Difficulty of Paying Living Expenses: Somewhat hard  Food Insecurity: No Food Insecurity (03/04/2024)   Hunger Vital Sign    Worried About Running Out of Food in the Last Year: Never true    Ran Out of Food in the Last Year: Never true  Transportation Needs: No Transportation Needs (03/04/2024)   PRAPARE - Administrator, Civil Service (Medical): No    Lack of Transportation (Non-Medical): No  Physical Activity: Inactive (02/06/2024)   Exercise Vital Sign    Days of Exercise per Week: 0 days    Minutes of Exercise per Session: Not on file  Stress: No Stress Concern Present (02/06/2024)   [employer] of Occupational Health - Occupational Stress Questionnaire    Feeling of Stress: Only a little  Social Connections: Moderately Integrated (02/19/2024)   Social Connection and Isolation Panel    Frequency of Communication with Friends and Family: More than three times a week    Frequency of Social Gatherings with Friends and Family: Three times a week    Attends Religious Services: 1 to 4 times per year    Active Member of Clubs or Organizations: No    Attends Engineer, structural: More than 4 times per year    Marital Status: Widowed  Recent Concern: Social Connections - Moderately Isolated (02/06/2024)   Social Connection and Isolation Panel    Frequency of Communication with Friends and Family: More than three times a week    Frequency of Social Gatherings with Friends  and Family: Three times a week    Attends Religious Services: 1 to 4 times per year    Active Member of Clubs or Organizations: No    Attends Banker Meetings: Not on file    Marital Status: Widowed  Intimate Partner Violence: Not At Risk (03/04/2024)   Humiliation, Afraid, Rape, and Kick questionnaire    Fear of Current  or Ex-Partner: No    Emotionally Abused: No    Physically Abused: No    Sexually Abused: No    Outpatient Medications Prior to Visit  Medication Sig Dispense Refill   amiodarone  (PACERONE ) 200 MG tablet Take 200 mg by mouth two times daily for 7 days; then take 200 mg by mouth daily thereafter 60 tablet 1   apixaban  (ELIQUIS ) 5 MG TABS tablet Take 1 tablet (5 mg total) by mouth 2 (two) times daily. 60 tablet 1   aspirin  EC 81 MG tablet Take 1 tablet (81 mg total) by mouth daily. Swallow whole.     calcium  carbonate (TUMS EX) 750 MG chewable tablet Chew 1-2 tablets by mouth 2 (two) times daily as needed for heartburn.     empagliflozin  (JARDIANCE ) 10 MG TABS tablet Take 1 tablet (10 mg total) by mouth daily before breakfast. 90 tablet 1   methocarbamol  (ROBAXIN ) 500 MG tablet Take 1 tablet (500 mg total) by mouth every 8 (eight) hours as needed for muscle spasms. 21 tablet 0   metoprolol  succinate (TOPROL  XL) 25 MG 24 hr tablet Take 1 tablet (25 mg total) by mouth daily. 90 tablet 3   Multiple Vitamin (MULTIVITAMIN WITH MINERALS) TABS tablet Take 1 tablet by mouth in the morning.     Oxycodone  HCl 10 MG TABS Take 1 tablet by mouth every 6 (six) hours as needed.     OXYGEN Inhale 2 L/min into the lungs continuous.     STELARA 45 MG/0.5ML injection Inject 45 mg into the skin as directed. Every 12 weeks     Timolol  Maleate, Once-Daily, 0.5 % SOLN Place 1 drop into both eyes in the morning and at bedtime.     TRAVATAN Z 0.004 % SOLN ophthalmic solution Place 1 drop into both eyes every morning.  1   amoxicillin -clavulanate (AUGMENTIN ) 875-125 MG tablet Take 1  tablet by mouth every 12 (twelve) hours. (Patient not taking: Reported on 03/12/2024) 5 tablet 0   varenicline  (CHANTIX  CONTINUING MONTH PAK) 1 MG tablet Take 1 tablet (1 mg total) by mouth 2 (two) times daily. (Patient not taking: Reported on 03/12/2024) 60 tablet 1   No facility-administered medications prior to visit.    No Known Allergies  ROS    See HPI Objective:    Physical Exam Constitutional:      General: He is not in acute distress.    Appearance: He is well-developed.  HENT:     Head: Normocephalic and atraumatic.  Cardiovascular:     Rate and Rhythm: Normal rate. Rhythm irregular.     Heart sounds: No murmur heard. Pulmonary:     Effort: Pulmonary effort is normal. No respiratory distress.     Breath sounds: Normal breath sounds. No wheezing or rales.  Musculoskeletal:     Right lower leg: 2+ Edema present.     Left lower leg: 2+ Edema present.     Comments: Incisions on trunk are clean/dry/intact  Skin:    General: Skin is warm and dry.  Neurological:     Mental Status: He is alert and oriented to person, place, and time.  Psychiatric:        Behavior: Behavior normal.        Thought Content: Thought content normal.   GU: not examined   BP (!) 126/59 (BP Location: Right Arm, Patient Position: Sitting, Cuff Size: Normal)   Pulse 66   Temp 98 F (36.7 C) (Oral)   Resp 16   Ht  6' 2 (1.88 m)   Wt 169 lb (76.7 kg)   SpO2 100%   BMI 21.70 kg/m  Wt Readings from Last 3 Encounters:  03/13/24 169 lb (76.7 kg)  03/12/24 173 lb (78.5 kg)  02/27/24 167 lb 15.9 oz (76.2 kg)       Assessment & Plan:   Problem List Items Addressed This Visit       Unprioritized   Spinal stenosis of lumbar region   He is following with [facility] for pain management and plans to schedule a follow up visit.      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 Keep upcoming appointment with Oncology. Continue  oxygen 4 L via [place].  Continues PT/OT at home.       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 Has follow up scheduled with Oncology.       Relevant Medications   doxycycline  (VIBRA -TABS) 100 MG tablet   History of tobacco abuse   Quit 5/25.       Edema - Primary   New. Trial of lasix . Recommend once daily for 2-3 days and then PRN.      Relevant Medications   furosemide  (LASIX ) 20 MG tablet   Cardiomyopathy (HCC), reduced LVEF on cardiac PET imaging   Continues jardiance .  Entresto  was held at discharge due to low BP. Will defer resuming Entresto  to Cardiology.       Relevant Medications   furosemide  (LASIX ) 20 MG tablet   Balanitis   Reports groin rash/erythema at the tip of his penis. Likely due to jardiance . Rx provided for clotrimazole  cream. Reinforced hygiene measures to reduce chance of recurrence.       Relevant Medications   Clotrimazole  1 % OINT   Abnormal chest x-ray   X-ray from yesterday notes the following:   Unchanged dense airspace consolidation filling the left mid and lower lung zones with blunting of the left costophrenic sulcus, possibly scarring or small effusion.  Due to c/o new night sweats, I am concerned about the possibility of pneumonia. Will rx with doxycycline . Check labs as ordered. Let me know if night sweats do not resolve in the next 1 week. Keep follow up appointment with thoracic surgery.      Relevant Medications   doxycycline  (VIBRA -TABS) 100 MG tablet   Other Relevant Orders   CBC w/Diff   Comp Met (CMET)    I have discontinued Davaun S. Moye's varenicline  and amoxicillin -clavulanate. I am also having him start on furosemide , doxycycline , and Clotrimazole . Additionally, I am having him maintain his Travatan Z, calcium  carbonate, Stelara, metoprolol  succinate, aspirin  EC, empagliflozin , Timolol  Maleate (Once-Daily),  multivitamin with minerals, apixaban , methocarbamol , amiodarone , OXYGEN, and Oxycodone  HCl.  Meds ordered this encounter  Medications   furosemide  (LASIX ) 20 MG tablet    Sig: Take 1 tablet (20 mg total) by mouth daily as needed for swelling    Dispense:  30 tablet    Refill:  0    Supervising Provider:   DOMENICA BLACKBIRD A [[id number]]   doxycycline  (VIBRA -TABS) 100 MG tablet    Sig: Take 1 tablet (100 mg total) by mouth 2 (two) times daily.    Dispense:  14 tablet    Refill:  0    Supervising Provider:   BLYTH, STACEY A [[id number]]   Clotrimazole  1 % OINT  Sig: Apply to affected area twice daily as needed    Dispense:  56.7 g    Refill:  0    Supervising Provider:   DOMENICA BLACKBIRD A [[id number]]

## 2024-03-13 NOTE — Assessment & Plan Note (Addendum)
 New finding, s/p lobectomy and excision. Keep upcoming appointment with Oncology. Continue oxygen 4 L via [place].  Continues PT/OT at home.

## 2024-03-13 NOTE — Assessment & Plan Note (Signed)
 Negative lymph nodes, s/p resection. Has follow up scheduled with Oncology.

## 2024-03-13 NOTE — Assessment & Plan Note (Signed)
 Continues jardiance .  Entresto  was held at discharge due to low BP. Will defer resuming Entresto  to Cardiology.

## 2024-03-14 ENCOUNTER — Inpatient Hospital Stay: Attending: Internal Medicine

## 2024-03-14 ENCOUNTER — Inpatient Hospital Stay (HOSPITAL_BASED_OUTPATIENT_CLINIC_OR_DEPARTMENT_OTHER): Admitting: Internal Medicine

## 2024-03-14 ENCOUNTER — Other Ambulatory Visit: Payer: Self-pay | Admitting: Physician Assistant

## 2024-03-14 VITALS — BP 115/59 | HR 65 | Resp 18 | Ht 74.0 in | Wt 167.0 lb

## 2024-03-14 DIAGNOSIS — R600 Localized edema: Secondary | ICD-10-CM | POA: Diagnosis not present

## 2024-03-14 DIAGNOSIS — C3432 Malignant neoplasm of lower lobe, left bronchus or lung: Secondary | ICD-10-CM | POA: Diagnosis not present

## 2024-03-14 DIAGNOSIS — R61 Generalized hyperhidrosis: Secondary | ICD-10-CM | POA: Diagnosis not present

## 2024-03-14 DIAGNOSIS — R0602 Shortness of breath: Secondary | ICD-10-CM | POA: Diagnosis not present

## 2024-03-14 DIAGNOSIS — C3490 Malignant neoplasm of unspecified part of unspecified bronchus or lung: Secondary | ICD-10-CM | POA: Diagnosis not present

## 2024-03-14 DIAGNOSIS — Z902 Acquired absence of lung [part of]: Secondary | ICD-10-CM | POA: Diagnosis not present

## 2024-03-14 DIAGNOSIS — J9382 Other air leak: Secondary | ICD-10-CM | POA: Diagnosis not present

## 2024-03-14 LAB — CBC WITH DIFFERENTIAL/PLATELET
Basophils Absolute: 0.1 K/uL (ref 0.0–0.1)
Basophils Relative: 1.5 % (ref 0.0–3.0)
Eosinophils Absolute: 0.4 K/uL (ref 0.0–0.7)
Eosinophils Relative: 4.5 % (ref 0.0–5.0)
HCT: 39.8 % (ref 39.0–52.0)
Hemoglobin: 13 g/dL (ref 13.0–17.0)
Lymphocytes Relative: 16.6 % (ref 12.0–46.0)
Lymphs Abs: 1.4 K/uL (ref 0.7–4.0)
MCHC: 32.7 g/dL (ref 30.0–36.0)
MCV: 98.6 fl (ref 78.0–100.0)
Monocytes Absolute: 1.1 K/uL — ABNORMAL HIGH (ref 0.1–1.0)
Monocytes Relative: 13.4 % — ABNORMAL HIGH (ref 3.0–12.0)
Neutro Abs: 5.3 K/uL (ref 1.4–7.7)
Neutrophils Relative %: 64 % (ref 43.0–77.0)
Platelets: 325 K/uL (ref 150.0–400.0)
RBC: 4.04 Mil/uL — ABNORMAL LOW (ref 4.22–5.81)
RDW: 13.6 % (ref 11.5–15.5)
WBC: 8.3 K/uL (ref 4.0–10.5)

## 2024-03-14 LAB — COMPREHENSIVE METABOLIC PANEL WITH GFR
ALT: 11 U/L (ref 0–53)
AST: 19 U/L (ref 0–37)
Albumin: 3.3 g/dL — ABNORMAL LOW (ref 3.5–5.2)
Alkaline Phosphatase: 81 U/L (ref 39–117)
BUN: 10 mg/dL (ref 6–23)
CO2: 26 meq/L (ref 19–32)
Calcium: 8.8 mg/dL (ref 8.4–10.5)
Chloride: 101 meq/L (ref 96–112)
Creatinine, Ser: 0.82 mg/dL (ref 0.40–1.50)
GFR: 89.49 mL/min (ref >60.00)
Glucose, Bld: 85 mg/dL (ref 70–99)
Potassium: 5.4 meq/L — ABNORMAL HIGH (ref 3.5–5.1)
Sodium: 140 meq/L (ref 135–145)
Total Bilirubin: 0.3 mg/dL (ref 0.2–1.2)
Total Protein: 6 g/dL (ref 6.0–8.3)

## 2024-03-14 LAB — CMP (CANCER CENTER ONLY)
ALT: 13 U/L (ref 0–44)
AST: 20 U/L (ref 15–41)
Albumin: 3.6 g/dL (ref 3.5–5.0)
Alkaline Phosphatase: 87 U/L (ref 38–126)
Anion gap: 6 (ref 5–15)
BUN: 12 mg/dL (ref 8–23)
CO2: 33 mmol/L — ABNORMAL HIGH (ref 22–32)
Calcium: 9.2 mg/dL (ref 8.9–10.3)
Chloride: 99 mmol/L (ref 98–111)
Creatinine: 1.03 mg/dL (ref 0.61–1.24)
GFR, Estimated: >60 mL/min (ref >60)
Glucose, Bld: 112 mg/dL — ABNORMAL HIGH (ref 70–99)
Potassium: 5.3 mmol/L — ABNORMAL HIGH (ref 3.5–5.1)
Sodium: 138 mmol/L (ref 135–145)
Total Bilirubin: 0.4 mg/dL (ref 0.0–1.2)
Total Protein: 6.8 g/dL (ref 6.5–8.1)

## 2024-03-14 LAB — CBC WITH DIFFERENTIAL (CANCER CENTER ONLY)
Abs Immature Granulocytes: 0.03 K/uL (ref 0.00–0.07)
Basophils Absolute: 0.1 K/uL (ref 0.0–0.1)
Basophils Relative: 1 %
Eosinophils Absolute: 0.4 K/uL (ref 0.0–0.5)
Eosinophils Relative: 5 %
HCT: 41 % (ref 39.0–52.0)
Hemoglobin: 13.7 g/dL (ref 13.0–17.0)
Immature Granulocytes: 0 %
Lymphocytes Relative: 18 %
Lymphs Abs: 1.7 K/uL (ref 0.7–4.0)
MCH: 32.1 pg (ref 26.0–34.0)
MCHC: 33.4 g/dL (ref 30.0–36.0)
MCV: 96 fL (ref 80.0–100.0)
Monocytes Absolute: 1.5 K/uL — ABNORMAL HIGH (ref 0.1–1.0)
Monocytes Relative: 16 %
Neutro Abs: 5.4 K/uL (ref 1.7–7.7)
Neutrophils Relative %: 60 %
Platelet Count: 308 K/uL (ref 150–400)
RBC: 4.27 MIL/uL (ref 4.22–5.81)
RDW: 12.8 % (ref 11.5–15.5)
WBC Count: 9 K/uL (ref 4.0–10.5)
nRBC: 0 % (ref 0.0–0.2)

## 2024-03-14 NOTE — Progress Notes (Signed)
 [facility] [phone]   [phone]  OFFICE PROGRESS NOTE  [name], NP [address] [place] [address]  DIAGNOSIS: Stage IA (T1b, N0, M0) non-small cell lung cancer, adenocarcinoma presented with left lower lobe lung nodule was suspicious mediastinal lymph nodes diagnosed in June 2025.  PRIOR THERAPY: Status post robotic assisted left lower lobectomy with lymph node dissection under the care of Dr. Kerrin on February 19, 2024.  The tumor size was 1.9 cm.  There was also a separate focus of adenocarcinoma in situ measuring 1.9 cm.  CURRENT THERAPY: Observation.  INTERVAL HISTORY: Brett [name] 68 y.o. male returns to the clinic today for follow-up visit accompanied by his daughter. Discussed the use of AI scribe software for clinical note transcription with the patient, who gave verbal consent to proceed.  History of Present Illness Brett [name] is a 68 year old male with stage 1A non-small cell lung cancer status post left lower lobectomy who presents for evaluation and discussion of treatment options post-surgery. He is accompanied by his daughter. He was referred by Dr. Kerrin for post-surgical evaluation.  He was diagnosed with stage 1A non-small cell lung cancer, adenocarcinoma, in June 2025 and underwent a left lower lobectomy with lymph node dissection on February 19, 2024. The tumor measured 1.9 cm with a small focus of adenocarcinoma in situ beside it, and there was no lymph node involvement.  Post-surgery, he experienced a severe air leak, which was addressed with a procedure involving the placement of devices described as 'upside down umbrellas' to seal the leak. Complications arose, necessitating an emergency procedure to remove these devices, which ultimately resolved the issue.  He has ongoing shortness of breath and has been using supplemental oxygen since the surgery, which was not required  prior to the procedure. It has been ten days since his discharge from the hospital.  He is currently on antibiotics, which were restarted by his primary care physician due to concerns about swelling in his feet and episodes of waking up drenched in sweat. He had not experienced swelling before. Four months ago, his only medication was glaucoma eye drops, indicating a significant change in his health status since then.     MEDICAL HISTORY: Past Medical History:  Diagnosis Date   Allergy    Arthritis    Asthma    Bilateral low back pain without sciatica 09/22/2015   Blood transfusion without reported diagnosis    Cancer (HCC)    Left Lower Lobe Adenocarcinoma   Cardiomyopathy (HCC)    Cervical neck pain with evidence of disc disease 09/22/2015   Chicken pox    Congenital hip deformity    Corn of foot 08/09/2022   Degenerative disc disease, cervical 01/29/2015   Dysrhythmia    SVT   Eczema 05/30/2013   Erectile dysfunction 09/22/2015   Glaucoma 05/30/2013   History of cardiac dysrhythmia 05/23/2023   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 08/06/2023   Pain of left hip joint 11/01/2018   Paroxysmal SVT (supraventricular tachycardia) (HCC), incidental finding IntraOp back surgery requiring esmolol ; event monitor November 2024 for short runs, longest episode 17 beats. 06/19/2023   Event monitor  November 2024, supraventricular ectopy burden 1.7%.  4 short runs of SVT, longest episode 17 beats.  No symptoms reported     Personal history of congenital hip dysplasia 05/30/2013   Preventative health care 01/29/2015   Rhinitis, allergic 09/22/2015   Rosacea 05/30/2013   Scoliosis  deformity of spine 05/05/2020   Spinal stenosis of lumbar region 06/16/2023   Spondylolisthesis at L5-S1 level 09/14/2020   Tobacco abuse 05/30/2013    ALLERGIES:  has no known allergies.  MEDICATIONS:  Current Outpatient Medications  Medication Sig Dispense Refill   amiodarone  (PACERONE ) 200 MG tablet Take 200 mg by mouth two times daily for 7 days; then take 200 mg by mouth daily thereafter 60 tablet 1   apixaban  (ELIQUIS ) 5 MG TABS tablet Take 1 tablet (5 mg total) by mouth 2 (two) times daily. 60 tablet 1   aspirin  EC 81 MG tablet Take 1 tablet (81 mg total) by mouth daily. Swallow whole.     calcium  carbonate (TUMS EX) 750 MG chewable tablet Chew 1-2 tablets by mouth 2 (two) times daily as needed for heartburn.     Clotrimazole  1 % OINT Apply to affected area twice daily as needed 56.7 g 0   doxycycline  (VIBRA -TABS) 100 MG tablet Take 1 tablet (100 mg total) by mouth 2 (two) times daily. 14 tablet 0   empagliflozin  (JARDIANCE ) 10 MG TABS tablet Take 1 tablet (10 mg total) by mouth daily before breakfast. 90 tablet 1   furosemide  (LASIX ) 20 MG tablet Take 1 tablet (20 mg total) by mouth daily as needed for swelling 30 tablet 0   methocarbamol  (ROBAXIN ) 500 MG tablet Take 1 tablet (500 mg total) by mouth every 8 (eight) hours as needed for muscle spasms. 21 tablet 0   metoprolol  succinate (TOPROL  XL) 25 MG 24 hr tablet Take 1 tablet (25 mg total) by mouth daily. 90 tablet 3   Multiple Vitamin (MULTIVITAMIN WITH MINERALS) TABS tablet Take 1 tablet by mouth in the morning.     Oxycodone  HCl 10 MG TABS Take 1 tablet by mouth every 6 (six) hours as needed.     OXYGEN Inhale 2 L/min into the lungs continuous.     STELARA 45 MG/0.5ML injection Inject 45 mg into the skin as directed. Every 12 weeks     Timolol  Maleate, Once-Daily, 0.5 % SOLN Place 1 drop into both eyes in the morning and at bedtime.     TRAVATAN Z 0.004 % SOLN ophthalmic solution Place 1 drop into both eyes every morning.  1    No current facility-administered medications for this visit.    SURGICAL HISTORY:  Past Surgical History:  Procedure Laterality Date   ANTERIOR LAT LUMBAR FUSION Right 05/09/2023   Procedure: Extreme Lateral Interbody Fusion Lumbar three-four -right;  Surgeon: Louis Shove, MD;  Location: [facility] OR;  Service: Neurosurgery;  Laterality: Right;  3C   BRONCHOSCOPY, WITH BIOPSY USING ELECTROMAGNETIC NAVIGATION Bilateral 01/09/2024   Procedure: ROBOTIC ASSISTED NAVIGATIONAL BRONCHOSCOPY;  Surgeon: Isadora Hose, MD;  Location: [facility] ORS;  Service: Pulmonary;  Laterality: Bilateral;   COLONOSCOPY  06/17/2015   Pyrtle   COLONOSCOPY  07/20/2020   Pyrtle   COLONOSCOPY  02/15/2024   ENDOBRONCHIAL ULTRASOUND Bilateral 01/09/2024   Procedure: ENDOBRONCHIAL ULTRASOUND (EBUS);  Surgeon: Isadora Hose, MD;  Location: [facility] ORS;  Service: Pulmonary;  Laterality: Bilateral;   HAMMER TOE SURGERY  07/28/2011   Procedure: HAMMER TOE CORRECTION;  Surgeon: Toribio JULIANNA Chancy, MD;  Location: [facility];  Service: Orthopedics;  Laterality: Right;  right 2nd and 4th toes correction hammer toe, capsulotomy metatarsal-phalangeal joints   HERNIA REPAIR Bilateral    HIP SURGERY  1986 & 2010   rt total hip-8/10-multiple rt hip surgeries- had 4 prior to 1986 as a child   INTERCOSTAL NERVE BLOCK Left 02/19/2024   Procedure: BLOCK, NERVE, INTERCOSTAL;  Surgeon: Kerrin Elspeth BROCKS, MD;  Location: [facility] OR;  Service: Thoracic;  Laterality: Left;   JOINT REPLACEMENT  2010   Partial Right Hip   LOBECTOMY, LUNG, ROBOT-ASSISTED, USING VATS Left 02/19/2024   Procedure: LOBECTOMY, LUNG, ROBOT-ASSISTED, USING VATS;  Surgeon: Kerrin Elspeth BROCKS, MD;  Location: [facility] OR;  Service: Thoracic;  Laterality: Left;  ROBOTIC LEFT LOWER LOBECTOMY   lumber fusion  09/2020   POLYPECTOMY     SENTINEL NODE BIOPSY Left 02/19/2024   Procedure: BIOPSY, LYMPH NODE;  Surgeon: Kerrin Elspeth BROCKS, MD;  Location: [facility] OR;  Service:  Thoracic;  Laterality: Left;   SPINE SURGERY  09/14/2020   THUMB ARTHROSCOPY  2008   rt   TOTAL HIP ARTHROPLASTY Right 1985   VIDEO BRONCHOSCOPY N/A 02/23/2024   Procedure: VIDEO BRONCHOSCOPY WITHOUT FLUORO;  Surgeon: Kerrin Elspeth BROCKS, MD;  Location: [facility];  Service: Thoracic;  Laterality: N/A;  Bronch with IBV insertion   VIDEO BRONCHOSCOPY WITH INSERTION OF INTERBRONCHIAL VALVE (IBV) N/A 02/23/2024   Procedure: BRONCHOSCOPY, FLEXIBLE, WITH INTRABRONCHIAL VALVE INSERTION;  Surgeon: Kerrin Elspeth BROCKS, MD;  Location: [facility] ENDOSCOPY;  Service: Thoracic;  Laterality: N/A;    REVIEW OF SYSTEMS:  Constitutional: positive for fatigue Eyes: negative Ears, nose, mouth, throat, and face: negative Respiratory: positive for dyspnea on exertion Cardiovascular: negative Gastrointestinal: negative Genitourinary:negative Integument/breast: negative Hematologic/lymphatic: negative Musculoskeletal:negative Neurological: negative Behavioral/Psych: negative Endocrine: negative Allergic/Immunologic: negative   PHYSICAL EXAMINATION: General appearance: alert, cooperative, and no distress Head: Normocephalic, without obvious abnormality, atraumatic Neck: no adenopathy, no JVD, supple, symmetrical, trachea midline, and thyroid  not enlarged, symmetric, no tenderness/mass/nodules Lymph nodes: Cervical, supraclavicular, and axillary nodes normal. Resp: clear to auscultation bilaterally Back: symmetric, no curvature. ROM normal. No CVA tenderness. Cardio: regular rate and rhythm, S1, S2 normal, no murmur, click, rub or gallop GI: soft, non-tender; bowel sounds normal; no masses,  no organomegaly Extremities: extremities normal, atraumatic, no cyanosis or edema Neurologic: Alert and oriented X 3, normal strength and tone. Normal symmetric reflexes. Normal coordination and gait  ECOG PERFORMANCE STATUS: 1 - Symptomatic but completely ambulatory  Blood pressure (!) 115/59, pulse 65, resp. rate  18, height 6' 2 (1.88 m), weight 167 lb (75.8 kg), SpO2 97%.  LABORATORY DATA: Lab Results  Component Value Date   WBC 9.0 03/14/2024   HGB 13.7 03/14/2024   HCT 41.0 03/14/2024   MCV 96.0 03/14/2024   PLT 308 03/14/2024      Chemistry      Component Value Date/Time   NA 136 02/27/2024 0344   NA 138 12/21/2023 0918   K 4.1 02/27/2024 0344   CL 98 02/27/2024 0344   CO2 29 02/27/2024 0344   BUN 10 02/27/2024 0344   BUN 15 12/21/2023 0918   CREATININE 0.82 02/27/2024 0344   CREATININE 0.95 02/01/2024 0842      Component Value Date/Time   CALCIUM  8.4 (L) 02/27/2024 0344   ALKPHOS 41 02/21/2024 0114   AST 34 02/21/2024 0114   AST 18 02/01/2024 0842   ALT 23 02/21/2024 0114   ALT 11 02/01/2024 0842   BILITOT 0.4 02/21/2024 0114   BILITOT 0.5 02/01/2024 9157  RADIOGRAPHIC STUDIES: DG Chest 2 View Result Date: 03/12/2024 CLINICAL DATA:  s/p lobectomy EXAM: CHEST - 2 VIEW COMPARISON:  March 12, 2024 FINDINGS: Biapical pleural thickening. Similar dense consolidation in the left mid and lower lung zones. Elevation of the left hemidiaphragm with blunting of the left costophrenic sulcus. Underlying emphysema. Leftward shift of the cardiomediastinal structures without cardiomegaly. Tortuous aorta with aortic atherosclerosis. No acute fracture or destructive lesions. Multilevel thoracic osteophytosis. IMPRESSION: Emphysema. Unchanged dense airspace consolidation filling the left mid and lower lung zones with blunting of the left costophrenic sulcus, possibly scarring or small effusion. Electronically Signed   By: Rogelia Myers M.D.   On: 03/12/2024 10:34   DG Chest 2 View Result Date: 03/03/2024 CLINICAL DATA:  Pleural effusion. EXAM: CHEST - 2 VIEW COMPARISON:  03/01/2024 FINDINGS: Airspace disease at the left base with small left effusion is similar to prior. Soft tissue gas in the left chest wall is similar to prior. Right lung remains clear with architectural distortion or  scarring towards the base. Calcific scarring noted in both lung apices. The cardiopericardial silhouette is within normal limits for size. No acute bony abnormality. Telemetry leads overlie the chest. IMPRESSION: 1. No significant interval change. 2. Persistent airspace disease at the left base with small left effusion. Electronically Signed   By: Camellia Candle M.D.   On: 03/03/2024 09:33   DG Chest 2 View Result Date: 03/01/2024 CLINICAL DATA:  Status post lumpectomy.  Partial lobectomy EXAM: CHEST - 2 VIEW COMPARISON:  02/28/2024 FINDINGS: Stable cardiac silhouette. Dense opacity in the LEFT lung base obscures the LEFT heart border. No appreciable pneumothorax on the LEFT. Small amount of gas in the subcutaneous LEFT chest wall. RIGHT lung clear. Chronic calcification at the RIGHT lung apex laterally. IMPRESSION: 1. Dense opacity in the LEFT lower hemithorax. 2. No pneumothorax. 3. No significant change. Electronically Signed   By: Jackquline Boxer M.D.   On: 03/01/2024 08:16   DG Chest 2 View Result Date: 02/28/2024 CLINICAL DATA:  Status post lobectomy. EXAM: CHEST - 2 VIEW COMPARISON:  02/27/2024 FINDINGS: Similar volume loss left hemithorax with left base airspace disease not substantially changed. Tiny left effusion again noted. Streaky opacity in the right apex and right base may be chronic atelectasis or scarring. Possible trace left apical pneumothorax superimposed on the posterior left third rib. The cardiopericardial silhouette is within normal limits for size. Telemetry leads overlie the chest. IMPRESSION: Similar volume loss left hemithorax with left base airspace disease and tiny left effusion. Possible trace left apical pneumothorax superimposed on the posterior left third rib. Electronically Signed   By: Camellia Candle M.D.   On: 02/28/2024 07:40   DG Chest 1V REPEAT Same Day Result Date: 02/27/2024 CLINICAL DATA:  Postoperative day 8 for left lower lobectomy, postoperative day 4 for removal  of endobronchial valves. Chest tube removal. EXAM: CHEST - 1 VIEW SAME DAY COMPARISON:  02/27/2024 FINDINGS: The left-sided chest tube has been removed. Continued airspace opacity at the left lung base with blunting of the left lateral costophrenic angle. Equivocal 2% left apical pneumothorax. The pattern of calcification along the right apical pleura is such that there is a small peripheral amount of aerated lung peripheral to the calcification, as on the CT scan from 01/08/2024. No right pneumothorax. Emphysema. Stable airspace opacity at the right lung base. Atherosclerotic calcification of the aortic arch. Stable left subcutaneous emphysema. IMPRESSION: 1. The left-sided chest tube has been removed. Equivocal 2% left apical pneumothorax. 2. Continued airspace  opacity at the left lung base with blunting of the left lateral costophrenic angle. 3. Stable airspace opacity at the right lung base. 4.  Emphysema (ICD10-J43.9). 5. Stable left subcutaneous emphysema. 6. Aortic Atherosclerosis (ICD10-I70.0). Electronically Signed   By: Ryan Salvage M.D.   On: 02/27/2024 13:19   DG Chest Port 1 View Result Date: 02/27/2024 CLINICAL DATA:  Status post left lower lobectomy for adenocarcinoma on 02/19/2024, removal of two intrabronchial valves on 02/23/2024. EXAM: PORTABLE CHEST 1 VIEW COMPARISON:  02/26/2024 FINDINGS: Left chest tube remains in place. Stable moderate subcutaneous emphysema on the left. Stable hazy airspace opacity along the left heart border and at the left lung base. Stable bandlike densities at the right lung base. Stable calcified pleural plaque at the right lung apex. Emphysema. Atherosclerotic calcification of the aortic arch. No pneumothorax identified. Volume loss in the left hemithorax compatible with prior lobectomy. IMPRESSION: 1. Stable left chest tube. No pneumothorax identified. 2. Stable hazy airspace opacity along the left heart border and at the left lung base. 3. Stable bandlike densities  at the right lung base. 4.  Emphysema (ICD10-J43.9). 5. Stable calcified pleural plaque at the right lung apex. 6. Aortic Atherosclerosis (ICD10-I70.0). Electronically Signed   By: Ryan Salvage M.D.   On: 02/27/2024 09:36   DG Chest 1V REPEAT Same Day Result Date: 02/26/2024 CLINICAL DATA:  Status post lobectomy. EXAM: CHEST - 1 VIEW SAME DAY COMPARISON:  None Available. FINDINGS: Left-sided chest tube in similar position. Bibasilar atelectasis or infiltrate. No detectable pneumothorax. Stable cardiac silhouette. Left chest wall soft tissue emphysema. No acute osseous pathology. IMPRESSION: No interval change. Electronically Signed   By: Vanetta Chou M.D.   On: 02/26/2024 15:56   DG CHEST PORT 1 VIEW Result Date: 02/25/2024 CLINICAL DATA:  68 year old male status post left lower lobectomy for adenocarcinoma, postoperative day 6. EXAM: PORTABLE CHEST 1 VIEW COMPARISON:  Portable chest yesterday and earlier. FINDINGS: Portable AP upright views at 0800 hours. Stable left chest tube, coursing to the upper 3rd of the left hemithorax. Neck and left chest wall subcutaneous emphysema has regressed. No pneumothorax is identified. Patchy and confluent left lung base opacity has regressed. Stable cardiac size and mediastinal contours. Stable right lung ventilation. Stable visualized osseous structures. Negative visible bowel gas. IMPRESSION: 1. Stable left chest tube. 2. No pneumothorax identified. Regressed subcutaneous emphysema and left lung base opacity since yesterday. 3. No new cardiopulmonary abnormality. Electronically Signed   By: VEAR Hurst M.D.   On: 02/25/2024 09:59   DG Chest 1 View Result Date: 02/24/2024 CLINICAL DATA:  68 year old male status post left lower lobectomy for adenocarcinoma postoperative day 5. EXAM: CHEST  1 VIEW COMPARISON:  Portable chest yesterday and earlier. FINDINGS: Portable AP semi upright views at 0548 hours. Left chest tube remains in place. Left lung ventilation and  volume has improved since yesterday. Patchy and confluent residual mid and lower lung opacity. No visible left pneumothorax. Fairly extensive left chest wall and bilateral lower neck subcutaneous emphysema appears mildly regressed. Stable cardiac size and mediastinal contours. Right lung appears stable and negative. Stable visualized osseous structures. Partially visible lumbar spinal fusion hardware. Nonobstructed bowel-gas pattern. IMPRESSION: 1. Left chest tube remains in place with no visible pneumothorax. Mildly regressed chest wall and lower neck subcutaneous emphysema. 2. Improved left lung ventilation since yesterday. No new cardiopulmonary abnormality. Electronically Signed   By: VEAR Hurst M.D.   On: 02/24/2024 08:25   DG Chest Port 1 View Result Date: 02/23/2024 CLINICAL  DATA:  [id number] S/P bronchoscopy [id number]. EXAM: PORTABLE CHEST 1 VIEW COMPARISON:  02/23/2024, 3:46 p.m. FINDINGS: There is interval improvement in the aeration of the left lung when compared to the recent prior exam, status post bronchoscopy. Stable positioning of the left pleural drainage catheter. No discrete pneumothorax/collapsed lung margin seen on this exam however, there is extensive overlying surgical emphysema which limits the evaluation. Underlying emphysematous changes noted in the right lung. No significant right pleural effusion. No right pneumothorax. Evaluation of cardiomediastinal silhouette is nondiagnostic due to left lower hemithorax opacification. No acute osseous abnormalities. The soft tissues are within normal limits. IMPRESSION: *Interval improvement in the aeration of the left lung, status post bronchoscopy. No discrete pneumothorax/collapsed lung margin seen on this exam however, there is extensive overlying surgical emphysema which limits the evaluation. Otherwise stable exam, as described above. Electronically Signed   By: Ree Molt M.D.   On: 02/23/2024 16:52   DG CHEST PORT 1 VIEW Result Date:  02/23/2024 CLINICAL DATA:  Status post bronchoscopy. EXAM: PORTABLE CHEST 1 VIEW COMPARISON:  Same day at 0600 hours and CT chest 01/08/2024. FINDINGS: Trachea, mediastinum and heart are shifted to the left. Small left pneumothorax with apical and basilar components. 2 endobronchial valves are seen in the left hilum. Near complete opacification of the left hemithorax. Persistent right basilar airspace opacification and streaky densities. Extensive subcutaneous emphysema in the neck and left chest wall. Left chest tube in place. IMPRESSION: 1. New near complete opacification of the left hemithorax with leftward shift of the heart and mediastinum, suggesting atelectasis and mucous plugging. Difficult to exclude post bronchoscopic parenchymal hemorrhage in association. 2. Small left pneumothorax with left chest tube in place. 3. Interval placement of 2 endobronchial valves in the left hilum. 4. Right basilar collapse/consolidation may be due to atelectasis. 5. Extensive bilateral neck and left chest wall subcutaneous emphysema. Electronically Signed   By: Newell Eke M.D.   On: 02/23/2024 16:08   DG Chest 1 View Result Date: 02/23/2024 CLINICAL DATA:  Status post lobectomy. EXAM: CHEST  1 VIEW COMPARISON:  February 22, 2024. FINDINGS: Stable cardiomediastinal silhouette. Increased right basilar opacity is noted concerning for worsening atelectasis or pneumonia. Stable small left apical pneumothorax. Stable extensive subcutaneous emphysema seen over left lateral chest wall and bilateral supraclavicular regions. Left-sided chest tube is unchanged. Left basilar atelectasis or infiltrate is noted. IMPRESSION: Stable left-sided chest tube with stable small left apical pneumothorax. Stable subcutaneous emphysema as noted above. Increased bibasilar opacities are noted concerning for worsening atelectasis or infiltrates. Electronically Signed   By: Lynwood Landy Raddle M.D.   On: 02/23/2024 08:38   DG Chest 1 View Result  Date: 02/22/2024 EXAM: 1 VIEW XRAY OF THE CHEST 02/22/2024 06:47:00 AM COMPARISON: 02/21/2024 CLINICAL HISTORY: Status post lobectomy of lung. FINDINGS: LUNGS AND PLEURA: Tiny left apical pneumothorax, estimated at 5% with visceral pleural line 7 mm from chest wall. Left greater than right base airspace disease, similar on the right and mildly increased on the left. HEART AND MEDIASTINUM: No acute abnormality of the cardiac and mediastinal silhouettes. BONES AND SOFT TISSUES: No acute osseous abnormality. Extensive subcutaneous emphysema about the left greater than right chest, minimally decreased. IMPRESSION: 1. Tiny left apical pneumothorax, estimated at 5%, minimally decreased. 2. Left greater than right base airspace disease, mildly increased on the left. Most likely atelectasis. 3. Extensive subcutaneous emphysema, left greater than right, minimally decreased. Electronically signed by: Rockey Kilts MD 02/22/2024 10:54 AM EDT RP Workstation: [id number]   DG  Chest 1 View Result Date: 02/21/2024 CLINICAL DATA:  68 year old male status post left lower lobectomy for adenocarcinoma postoperative day 2. EXAM: CHEST  1 VIEW COMPARISON:  Portable chest yesterday and earlier. FINDINGS: Portable AP semi upright views at 0559 hours. Stable left chest tube. Moderate volume left chest wall, bilateral lower neck, smaller volume right chest wall subcutaneous emphysema. This appears mildly progressed from yesterday. Comparatively small volume left pneumothorax is visible in the apex. Stable lung volumes. Stable cardiac size and mediastinal contours. Postoperative changes along the left mediastinum. Stable right lung ventilation, streaky and platelike right lower lung atelectasis. Underlying emphysema demonstrated by CT last month. Stable visualized osseous structures. IMPRESSION: 1. Stable left chest tube. Small volume left apical pneumothorax and moderate volume chest and neck subcutaneous emphysema following left lower  lobectomy. 2. Underlying emphysema.  Patchy right lung base atelectasis. Electronically Signed   By: VEAR Hurst M.D.   On: 02/21/2024 09:55   DG Chest Port 1 View Result Date: 02/20/2024 CLINICAL DATA:  Status post left lower lobectomy EXAM: PORTABLE CHEST 1 VIEW COMPARISON:  Chest radiograph dated 02/19/2024 FINDINGS: Lines/tubes: Slight interval retraction of left medial pleural catheter. Interval increased extensive subcutaneous emphysema extending into the left neck, left chest wall, and right neck. Lungs: Well inflated lungs. Bibasilar patchy opacities. Similar right apical pleural calcifications. Pleura: Possible trace left pneumothorax.  No pleural effusion. Heart/mediastinum: The heart size and mediastinal contours are within normal limits. Bones: No acute osseous abnormality. IMPRESSION: 1. Possible trace left pneumothorax. Slight interval retraction of left medial pleural catheter. 2. Interval increased extensive subcutaneous emphysema extending into the left neck, left chest wall, and right neck. 3. Bibasilar patchy opacities, likely atelectasis. Electronically Signed   By: Limin  Xu M.D.   On: 02/20/2024 10:02   DG Chest Port 1 View Result Date: 02/19/2024 CLINICAL DATA:  Status post left-sided lobectomy. EXAM: PORTABLE CHEST 1 VIEW COMPARISON:  Preop x-ray 02/15/2024 FINDINGS: Hyperinflation. Left-sided chest tube in place with a moderate left-sided apical pneumothorax. Some basilar components. Left-sided chest wall gas. Normal cardiopericardial silhouette calcified aorta. There is some linear opacity at the right lung base likely atelectasis. No edema. Overlapping cardiac leads. No right-sided pneumothorax. IMPRESSION: Surgical changes of left lobectomy with moderate left pneumothorax by chest tube in place. No midline shift. Recommend follow-up Electronically Signed   By: Ranell Bring M.D.   On: 02/19/2024 14:11   DG Chest 2 View Result Date: 02/15/2024 CLINICAL DATA:  Preop chest exam. Primary  adenocarcinoma of left lower lobe. EXAM: CHEST - 2 VIEW COMPARISON:  Radiographs 01/09/2024, CT 01/08/2024 FINDINGS: The heart is normal in size. Stable mediastinal contours. Aortic atherosclerosis. Left lower lobe nodules on CT has no radiographic correlate. No confluent consolidation, pleural effusion or pneumothorax. Normal pulmonary vasculature. The lungs are hyperinflated with emphysema. IMPRESSION: 1. No acute chest findings. 2. Emphysema. 3. Left lower lobe nodules on CT are not well seen by radiograph. Electronically Signed   By: Andrea Gasman M.D.   On: 02/15/2024 15:02    ASSESSMENT AND PLAN: This is a very pleasant 68 years old white male with Stage IA (T1b, N0, M0) non-small cell lung cancer, adenocarcinoma presented with left lower lobe lung nodule was suspicious mediastinal lymph nodes diagnosed in June 2025.  He is status post left lower lobectomy with lymph node dissection on February 19, 2024 with the final tumor size of 1.9 cm non-small cell lung cancer, adenocarcinoma with additional 1.9 cm adenocarcinoma in situ focus. Assessment and Plan Assessment &  Plan History of stage 1A non-small cell lung adenocarcinoma, status post left lower lobectomy Stage 1A non-small cell lung adenocarcinoma, adenocarcinoma subtype, diagnosed in June 2025. Status post left lower lobectomy with lymph node dissection on February 19, 2024. Tumor measured 1.9 cm with a small focus of adenocarcinoma in situ. No lymph node involvement. No adjuvant chemotherapy needed due to early stage and complete resection. Increased risk of developing another lung cancer due to history of lung cancer. - Order chest CT scan in six months to monitor for recurrence. - Continue surveillance with chest CT every six months for the first two years, then annually if no recurrence is detected.  Postoperative recovery following left lower lobectomy for lung cancer Postoperative recovery complicated by a severe air leak requiring emergency  intervention. The air leak was addressed with an 'upside down umbrella' procedure, initially unsuccessful and required removal to seal the leak. Currently recovering with some shortness of breath and use of supplemental oxygen. Recovery is in early stages, being only ten days post-hospital discharge.  Shortness of breath after lung surgery Shortness of breath likely related to recent lung surgery and early recovery phase. Supplemental oxygen is being used as needed.  Peripheral edema and night sweats postoperatively Peripheral edema and night sweats possibly due to recent antibiotic use or weakened immune system post-surgery. Primary care physician has restarted antibiotics as a precautionary measure. The patient was advised to call immediately if he has any other concerning symptoms in the interval. The patient voices understanding of current disease status and treatment options and is in agreement with the current care plan.  All questions were answered. The patient knows to call the clinic with any problems, questions or concerns. We can certainly see the patient much sooner if necessary.  The total time spent in the appointment was 30 minutes including review of chart and various tests results, discussions about plan of care and coordination of care plan .   Disclaimer: This note was dictated with voice recognition software. Similar sounding words can inadvertently be transcribed and may not be corrected upon review.

## 2024-03-15 ENCOUNTER — Ambulatory Visit: Payer: Self-pay | Admitting: Family

## 2024-03-15 DIAGNOSIS — E875 Hyperkalemia: Secondary | ICD-10-CM

## 2024-03-15 NOTE — Telephone Encounter (Signed)
 Patient scheduled to come inf or bmet  03/19/24

## 2024-03-15 NOTE — Telephone Encounter (Signed)
 Potassium elevated. Let's have him repeat bmet please.

## 2024-03-18 ENCOUNTER — Encounter: Payer: Self-pay | Admitting: Family

## 2024-03-18 ENCOUNTER — Other Ambulatory Visit: Payer: Self-pay

## 2024-03-18 DIAGNOSIS — M5136 Other intervertebral disc degeneration, lumbar region with discogenic back pain only: Secondary | ICD-10-CM | POA: Diagnosis not present

## 2024-03-18 DIAGNOSIS — Z79899 Other long term (current) drug therapy: Secondary | ICD-10-CM | POA: Diagnosis not present

## 2024-03-18 DIAGNOSIS — Z6821 Body mass index (BMI) 21.0-21.9, adult: Secondary | ICD-10-CM | POA: Diagnosis not present

## 2024-03-18 DIAGNOSIS — R768 Other specified abnormal immunological findings in serum: Secondary | ICD-10-CM | POA: Diagnosis not present

## 2024-03-18 DIAGNOSIS — R030 Elevated blood-pressure reading, without diagnosis of hypertension: Secondary | ICD-10-CM | POA: Diagnosis not present

## 2024-03-18 NOTE — Transitions of Care (Post Inpatient/ED Visit) (Signed)
 Transition of Care week 3  Visit Note  03/18/2024  Name: Brett [name] MRN: [id number]          DOB: [birth year]  Situation: Patient enrolled in [facility] 30-day program. Visit completed with Ozil Danzy by telephone.   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 08/06/2023   Pain of left hip joint 11/01/2018   Paroxysmal SVT (supraventricular tachycardia) (HCC), incidental finding IntraOp back surgery requiring esmolol ; event monitor November 2024 for short runs, longest episode 17 beats. 06/19/2023   Event monitor November 2024, supraventricular ectopy burden 1.7%.  4 short runs of SVT, longest episode 17 beats.  No symptoms reported     Personal history of congenital hip dysplasia 05/30/2013   Preventative health care 01/29/2015   Rhinitis, allergic 09/22/2015   Rosacea 05/30/2013   Scoliosis deformity of spine 05/05/2020   Spinal stenosis of lumbar region 06/16/2023   Spondylolisthesis at L5-S1 level 09/14/2020   Tobacco abuse 05/30/2013    Assessment: Patient Reported  Symptoms: Cognitive Cognitive Status: Alert and oriented to person, place, and time      Neurological Neurological Review of Symptoms: No symptoms reported    HEENT HEENT Symptoms Reported: Other: HEENT Comment: The patient complaines of some dryness in his nose from the oxygen. Educated on water based products to use on the cannula    Cardiovascular Cardiovascular Symptoms Reported: Swelling in legs or feet Does patient have uncontrolled Hypertension?: No Cardiovascular Management Strategies: Medication therapy, Adequate rest Cardiovascular Comment: th epatient was started on Furosemide  for three days and the as needed  Respiratory Respiratory Symptoms Reported: Dry cough Additional Respiratory Details: May have some infiltrate in the base of the lung. He has been started on an antibiotic Respiratory Management Strategies: Oxygen therapy, Routine screening, Activity, Coping strategies  Endocrine Endocrine Symptoms Reported: No symptoms reported Is patient diabetic?: No    Gastrointestinal Gastrointestinal Symptoms Reported: No symptoms reported      Genitourinary Genitourinary Symptoms Reported: No symptoms reported    Integumentary Integumentary Symptoms Reported: Sweating Additional Integumentary Details: The patient has Psoriasis Skin Management Strategies: Medication therapy, Routine screening, Adequate rest Skin Comment: He takes Stela injection every three months  Musculoskeletal Musculoskelatal Symptoms Reviewed: Back pain, Weakness Musculoskeletal Management Strategies: Medication therapy, Routine screening, Exercise Musculoskeletal Comment: Continues with HH PT      Psychosocial Psychosocial Symptoms Reported: No symptoms reported         There were no vitals filed for this visit.  Medications Reviewed Today     Reviewed by [name], RN (Case  Manager) on 03/18/24 at 1320  Med List Status: <None>   Medication Order Taking? Sig Documenting Provider Last  Dose Status Informant  amiodarone  (PACERONE ) 200 MG tablet [id number]  Take 200 mg by mouth two times daily for 7 days; then take 200 mg by mouth daily thereafter [name], [name], PA-C  Active   apixaban  (ELIQUIS ) 5 MG TABS tablet [id number]  Take 1 tablet (5 mg total) by mouth 2 (two) times daily. [name] M, PA-C  Active   aspirin  EC 81 MG tablet [id number]  Take 1 tablet (81 mg total) by mouth daily. Swallow whole. [name], [name], MD  Active Self  calcium  carbonate (TUMS EX) 750 MG chewable tablet [id number]  Chew 1-2 tablets by mouth 2 (two) times daily as needed for heartburn. [provider]  Active Self  Clotrimazole  1 % OINT [id number]  Apply to affected area twice daily as needed O'Sullivan, Melissa, NP  Active   doxycycline  (VIBRA -TABS) 100 MG tablet [id number]  Take 1 tablet (100 mg total) by mouth 2 (two) times daily. [name], NP  Active   empagliflozin  (JARDIANCE ) 10 MG TABS tablet [id number]  Take 1 tablet (10 mg total) by mouth daily before breakfast. [name], [name], MD  Active Self  furosemide  (LASIX ) 20 MG tablet [id number]  Take 1 tablet (20 mg total) by mouth daily as needed for swelling [name], NP  Active   methocarbamol  (ROBAXIN ) 500 MG tablet [id number]  Take 1 tablet (500 mg total) by mouth every 8 (eight) hours as needed for muscle spasms. [name] M, PA-C  Active   metoprolol  succinate (TOPROL  XL) 25 MG 24 hr tablet [id number]  Take 1 tablet (25 mg total) by mouth daily. [name], [name], MD  Active Self  Multiple Vitamin (MULTIVITAMIN WITH MINERALS) TABS tablet [id number]  Take 1 tablet by mouth in the morning. [provider]  Active Self  Oxycodone  HCl 10 MG TABS [id number]  Take 1 tablet by mouth every 6 (six) hours as needed. [provider]  Active   OXYGEN  [id number]  Inhale 2 L/min into the lungs continuous. [provider]  Active   STELARA 45 MG/0.5ML injection [id number]   Inject 45 mg into the skin as directed. Every 12 weeks [provider]  Active Self           Med Note CLAUD, [name] Feb 08, 2024 10:34 AM) Next dose due:02/15/24  Timolol  Maleate, Once-Daily, 0.5 % SOLN [id number]  Place 1 drop into both eyes in the morning and at bedtime. [provider]  Active Self  TRAVATAN Z 0.004 % SOLN ophthalmic solution [id number]  Place 1 drop into both eyes every morning. [provider]  Active Self           Med Note [name] Sep 03, 2020  2:43 PM)              Recommendation:   Continue Current Plan of Care  Follow Up Plan:   Telephone follow-up in 1 week  Medford Balboa, BSN, RN [place]  [facility] - [facility] RN Care Manager [phone]

## 2024-03-18 NOTE — Telephone Encounter (Signed)
 Patient has an appt with Dr. Soldatova on 03/22/24 per Epic

## 2024-03-18 NOTE — Patient Instructions (Signed)
 Visit Information  Thank you for taking time to visit with me today. Please don't hesitate to contact me if I can be of assistance to you before our next scheduled telephone appointment.  Our next appointment is by telephone on Monday 03/25/24 at 1:00pm  Following is a copy of your care plan:   Goals Addressed             This Visit's Progress    VBCI Transitions of Care (TOC) Care Plan       Problems: (reviewed 03/18/24) Recent Hospitalization for treatment of Pulmonary Disease: Lung Cancer, Lobectomy  Goal: (reviewed 03/18/24) Over the next 30 days, the patient will not experience hospital readmission  Interventions: (reviewed 03/18/24)  Surgery (02/19/24): Evaluation of current treatment plan related to Lobectomy due to Lung Cancer surgery assessed patient/caregiver understanding of surgical procedure   reviewed post-operative instructions with patient/caregiver reviewed medications with patient and addressed questions reviewed scheduled provider appointments with patient: Follow Up with Thoracic Surgeon Elspeth Leek 03/05/24. - rescheduled to 03/12/24 confirmed availability of transportation to all appointments :Yes. The patient's daughter will transport [facility] to start 03/04/24 for strengthening, increase stamina Continue with smoking cessation Wear Oxygen 4l/min continuous. Contact [facility] [phone] for portable oxygen  concentrator test.  - 03/18/24 - The patient has been started on Doxycycline    - Trial of Lasix  for 3 days and then PRN for LE edema   Patient Self Care Activities: (Reviewed 03/18/24) Attend all scheduled provider appointments Call pharmacy for medication refills 3-7 days in advance of running out of medications Call provider office for new concerns or questions  Notify RN Care Manager of [facility] call rescheduling needs Participate in Transition of Care Program/Attend [facility] scheduled calls Perform all self care activities independently  Take medications  as prescribed    Plan:  Telephone follow up appointment with care management team member scheduled for:  Monday August 18th at 1:00pm        Patient verbalizes understanding of instructions and care plan provided today and agrees to view in MyChart. Active MyChart status and patient understanding of how to access instructions and care plan via MyChart confirmed with patient.     The patient has been provided with contact information for the care management team and has been advised to call with any health related questions or concerns.   Please call the care guide team at [phone] if you need to cancel or reschedule your appointment.   Please call the Suicide and Crisis Lifeline: 988 call the [place]  National Suicide Prevention Lifeline: 775 494 4488 or TTY: 816 458 7542 TTY [phone]) to talk to a trained counselor if you are experiencing a Mental Health or Behavioral Health Crisis or need someone to talk to.  Medford Balboa, BSN, RN [place]  VBCI - [employer] [facility] RN Care Manager [phone]

## 2024-03-19 ENCOUNTER — Other Ambulatory Visit (INDEPENDENT_AMBULATORY_CARE_PROVIDER_SITE_OTHER)

## 2024-03-19 DIAGNOSIS — E875 Hyperkalemia: Secondary | ICD-10-CM | POA: Diagnosis not present

## 2024-03-19 DIAGNOSIS — R768 Other specified abnormal immunological findings in serum: Secondary | ICD-10-CM

## 2024-03-19 LAB — BASIC METABOLIC PANEL WITH GFR
BUN: 12 mg/dL (ref 6–23)
CO2: 33 meq/L — ABNORMAL HIGH (ref 19–32)
Calcium: 9.5 mg/dL (ref 8.4–10.5)
Chloride: 98 meq/L (ref 96–112)
Creatinine, Ser: 0.83 mg/dL (ref 0.40–1.50)
GFR: 89.16 mL/min (ref >60.00)
Glucose, Bld: 102 mg/dL — ABNORMAL HIGH (ref 70–99)
Potassium: 5.6 meq/L — ABNORMAL HIGH (ref 3.5–5.1)
Sodium: 138 meq/L (ref 135–145)

## 2024-03-19 LAB — SEDIMENTATION RATE: Sed Rate: 9 mm/h (ref 0–20)

## 2024-03-22 ENCOUNTER — Ambulatory Visit: Payer: Self-pay | Admitting: Family

## 2024-03-22 ENCOUNTER — Telehealth: Payer: Self-pay | Admitting: Family

## 2024-03-22 ENCOUNTER — Ambulatory Visit (INDEPENDENT_AMBULATORY_CARE_PROVIDER_SITE_OTHER): Admitting: Otolaryngology

## 2024-03-22 VITALS — BP 150/75 | HR 70

## 2024-03-22 DIAGNOSIS — K118 Other diseases of salivary glands: Secondary | ICD-10-CM

## 2024-03-22 DIAGNOSIS — D119 Benign neoplasm of major salivary gland, unspecified: Secondary | ICD-10-CM

## 2024-03-22 LAB — ANTI-NUCLEAR AB-TITER (ANA TITER): ANA Titer 1: 1:40 {titer} — ABNORMAL HIGH

## 2024-03-22 LAB — ANA: Anti Nuclear Antibody (ANA): POSITIVE — AB

## 2024-03-22 LAB — RHEUMATOID FACTOR: Rheumatoid fact SerPl-aCnc: 203 [IU]/mL — ABNORMAL HIGH (ref <14)

## 2024-03-22 NOTE — Telephone Encounter (Signed)
 See mychart.

## 2024-03-22 NOTE — Progress Notes (Signed)
 ENT CONSULT:  Reason for Consult: left parotid tumor    HPI: Discussed the use of AI scribe software for clinical note transcription with the patient, who gave verbal consent to proceed.  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  He is being treated for stage I lung cancer, and was told he will not require chemo or radiation. He had surgery for it. He is on supplemental oxygen currently.   A nodule was discovered incidentally through MRIs, measuring one centimeter located in left parotid tail. Denies hx of skin cancer, denies hx of smoking.   Records Reviewed:  Oncology note by Dr Sherrod 03/14/24 DIAGNOSIS: Stage IA (T1b, N0, M0) non-small cell lung cancer, adenocarcinoma presented with left lower lobe lung nodule was suspicious mediastinal lymph nodes diagnosed in June 2025.   PRIOR THERAPY: Status post robotic assisted left lower lobectomy with lymph node dissection under the care of Dr. Kerrin on February 19, 2024.  The tumor size was 1.9 cm.  There was also a separate focus of adenocarcinoma in situ measuring 1.9 cm.    Past Medical History:  Diagnosis Date   Allergy    Arthritis    Asthma    Bilateral low back pain without sciatica 09/22/2015   Blood transfusion without reported diagnosis    Cancer (HCC)    Left Lower Lobe Adenocarcinoma   Cardiomyopathy (HCC)    Cervical neck pain with evidence of disc disease 09/22/2015   Chicken pox    Congenital hip deformity    Corn of foot 08/09/2022   Degenerative disc disease, cervical 01/29/2015   Dysrhythmia    SVT   Eczema 05/30/2013   Erectile dysfunction 09/22/2015   Glaucoma 05/30/2013   History of cardiac dysrhythmia 05/23/2023   Isthmic spondylolisthesis 05/05/2020   Lumbar adjacent segment disease with spondylolisthesis 05/09/2023   Need for shingles vaccine 09/22/2015   Neuromuscular disorder  (HCC) 11/2019   sciatica   Nodule of left lung    Nonsustained ventricular tachycardia (HCC), identified on event monitor November 2024, 1 episode 10 beats duration, asymptomatic. 08/06/2023   Pain of left hip joint 11/01/2018   Paroxysmal SVT (supraventricular tachycardia) (HCC), incidental finding IntraOp back surgery requiring esmolol ; event monitor November 2024 for short runs, longest episode 17 beats. 06/19/2023   Event monitor November 2024, supraventricular ectopy burden 1.7%.  4 short runs of SVT, longest episode 17 beats.  No symptoms reported     Personal history of congenital hip dysplasia 05/30/2013   Preventative health care 01/29/2015   Rhinitis, allergic 09/22/2015   Rosacea 05/30/2013   Scoliosis deformity of spine 05/05/2020   Spinal stenosis of lumbar region 06/16/2023   Spondylolisthesis at L5-S1 level 09/14/2020   Tobacco abuse 05/30/2013    Past Surgical History:  Procedure Laterality Date   ANTERIOR LAT LUMBAR FUSION Right 05/09/2023   Procedure: Extreme Lateral Interbody Fusion Lumbar three-four -right;  Surgeon: Louis Shove, MD;  Location: [facility] OR;  Service: Neurosurgery;  Laterality: Right;  3C   BRONCHOSCOPY, WITH BIOPSY USING ELECTROMAGNETIC NAVIGATION Bilateral 01/09/2024   Procedure: ROBOTIC ASSISTED NAVIGATIONAL BRONCHOSCOPY;  Surgeon: Isadora Hose, MD;  Location: [facility] ORS;  Service: Pulmonary;  Laterality: Bilateral;   COLONOSCOPY  06/17/2015   Pyrtle   COLONOSCOPY  07/20/2020   Pyrtle   COLONOSCOPY  02/15/2024   ENDOBRONCHIAL ULTRASOUND Bilateral 01/09/2024   Procedure: ENDOBRONCHIAL ULTRASOUND (EBUS);  Surgeon: Isadora Hose, MD;  Location: [facility] ORS;  Service: Pulmonary;  Laterality: Bilateral;   HAMMER TOE SURGERY  07/28/2011   Procedure: HAMMER TOE CORRECTION;  Surgeon: Toribio JULIANNA Chancy, MD;  Location: [facility];  Service: Orthopedics;  Laterality: Right;  right 2nd and 4th toes correction hammer toe, capsulotomy metatarsal-phalangeal  joints   HERNIA REPAIR Bilateral    HIP SURGERY  1986 & 2010   rt total hip-8/10-multiple rt hip surgeries- had 4 prior to 1986 as a child   INTERCOSTAL NERVE BLOCK Left 02/19/2024   Procedure: BLOCK, NERVE, INTERCOSTAL;  Surgeon: Kerrin Elspeth BROCKS, MD;  Location: [facility] OR;  Service: Thoracic;  Laterality: Left;   JOINT REPLACEMENT  2010   Partial Right Hip   LOBECTOMY, LUNG, ROBOT-ASSISTED, USING VATS Left 02/19/2024   Procedure: LOBECTOMY, LUNG, ROBOT-ASSISTED, USING VATS;  Surgeon: Kerrin Elspeth BROCKS, MD;  Location: [facility] OR;  Service: Thoracic;  Laterality: Left;  ROBOTIC LEFT LOWER LOBECTOMY   lumber fusion  09/2020   POLYPECTOMY     SENTINEL NODE BIOPSY Left 02/19/2024   Procedure: BIOPSY, LYMPH NODE;  Surgeon: Kerrin Elspeth BROCKS, MD;  Location: [facility] OR;  Service: Thoracic;  Laterality: Left;   SPINE SURGERY  09/14/2020   THUMB ARTHROSCOPY  2008   rt   TOTAL HIP ARTHROPLASTY Right 1985   VIDEO BRONCHOSCOPY N/A 02/23/2024   Procedure: VIDEO BRONCHOSCOPY WITHOUT FLUORO;  Surgeon: Kerrin Elspeth BROCKS, MD;  Location: [facility] ENDOSCOPY;  Service: Thoracic;  Laterality: N/A;  Bronch with IBV insertion   VIDEO BRONCHOSCOPY WITH INSERTION OF INTERBRONCHIAL VALVE (IBV) N/A 02/23/2024   Procedure: BRONCHOSCOPY, FLEXIBLE, WITH INTRABRONCHIAL VALVE INSERTION;  Surgeon: Kerrin Elspeth BROCKS, MD;  Location: [facility] ENDOSCOPY;  Service: Thoracic;  Laterality: N/A;    Family History  Problem Relation Age of Onset   Cancer Mother 51       history of colon cancer   Rosacea Mother    Colon cancer Mother 70   Colon polyps Mother    Rosacea Father    Lung disease Father        ?pulmonary fibrosis   Alcohol abuse Brother    Depression Brother    Colon cancer Maternal Grandmother    Endocrine tumor Daughter        pituitary tumor, POTTS   Thyroid  disease Son        ?hyperthyroid   Cancer Cousin        colon   Colon cancer Cousin    Esophageal cancer Neg Hx    Rectal cancer Neg Hx    Stomach cancer  Neg Hx     Social History:  reports that he quit smoking about 3 months ago. His smoking use included cigarettes. He started smoking about 50 years ago. He has a 25.2 pack-year smoking history. He has never used smokeless tobacco. He reports current alcohol use of about 14.0 standard drinks of alcohol per week. He reports that he does not use drugs.  Allergies: No Known Allergies  Medications: I have reviewed the patient's current medications.  The PMH, PSH, Medications, Allergies, and SH were reviewed and updated.  ROS: Constitutional: Negative for fever, weight loss and weight gain. Cardiovascular: Negative for chest pain and dyspnea on exertion. Respiratory: Is not experiencing shortness of breath at rest. Gastrointestinal: Negative for nausea and vomiting. Neurological: Negative for headaches. Psychiatric: The patient is not nervous/anxious  Blood pressure (!) 150/75, pulse 70, SpO2 93%. There is no height or weight on file to calculate BMI.  PHYSICAL EXAM: Exam: General: Well-developed, well-nourished Respiratory Respiratory effort: Equal inspiration and expiration without stridor Cardiovascular Peripheral Vascular: Warm extremities with equal color/perfusion Eyes: No nystagmus with equal extraocular motion bilaterally Neuro/Psych/Balance: Patient oriented to person, place, and time; Appropriate mood and affect; Gait is intact with no imbalance; Cranial nerves I-XII are intact Head and Face Inspection: Normocephalic and atraumatic without mass or lesion Palpation: Facial skeleton intact without bony stepoffs Salivary Glands: No mass or tenderness  Facial Strength: Facial motility symmetric and full bilaterally ENT Pinna: External ear intact and fully developed External canal: Canal is patent with intact skin Tympanic Membrane: Clear and mobile External Nose: No scar or anatomic deformity Internal Nose: Septum is relatively straight on anterior rhinoscopy. Bilateral  inferior turbinate hypertrophy.  Lips, Teeth, and gums: Mucosa and teeth intact and viable Oral cavity/oropharynx: No erythema or exudate, no lesions present Neck Neck and Trachea: Midline trachea without mass or lesion Thyroid : No mass or nodularity Lymphatics: No lymphadenopathy   Studies Reviewed: Neck MRI 01/08/24 IMPRESSION: Subtle T1 hypointense 1.1 cm T1 hypointensity in the inferior left parotid gland at the site of previously seen hypermetabolic activity on PET CT, suspicious for a mass. This could represent a primary parotid neoplasm or mildly enlarged intraparotid lymph node. Recommend ENT consultation for management.  FNA of parotid gland 02/05/24 Specimen Submitted:  A. PAROTID MASS, LEFT, FINE NEEDLE ASPIRATION    FINAL MICROSCOPIC DIAGNOSIS:  - Atypical cells present  - Consistent with a benign Warthin's tumor   Assessment/Plan: Encounter Diagnoses  Name Primary?   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 No hx of skin cancer, no smoking hx. He denies facial weakness or paresthesias.  - Order follow-up MRI in January 2026 to monitor for changes. - Schedule follow-up appointment in February 2026 to discuss MRI results and management. - will consider surgery in the future after he finishes treatment for lung cancer   Thank you for allowing me to participate in the care of this patient. Please do not hesitate to contact me with any questions or concerns.   Elena Larry, MD Otolaryngology [facility] Phone: [phone] Fax: [phone]    03/22/2024, 8:56 AM

## 2024-03-25 ENCOUNTER — Other Ambulatory Visit: Payer: Self-pay

## 2024-03-25 NOTE — Patient Instructions (Signed)
 Visit Information  Thank you for taking time to visit with me today. Please don't hesitate to contact me if I can be of assistance to you before our next scheduled telephone appointment.  Our next appointment is by telephone on Tuesday September 2nd at 1:30pm  Following is a copy of your care plan:   Goals Addressed             This Visit's Progress    VBCI Transitions of Care (TOC) Care Plan       Problems: (reviewed 03/25/24) Recent Hospitalization for treatment of Pulmonary Disease: Lung Cancer, Lobectomy  Goal: (reviewed 03/25/24) Over the next 30 days, the patient will not experience hospital readmission  Interventions: (reviewed 03/25/24)  Surgery (02/19/24): Evaluation of current treatment plan related to Lobectomy due to Lung Cancer surgery assessed patient/caregiver understanding of surgical procedure   reviewed post-operative instructions with patient/caregiver reviewed medications with patient and addressed questions reviewed scheduled provider appointments with patient: Follow Up with Thoracic Surgeon Elspeth Leek 03/05/24. - rescheduled to 03/12/24 confirmed availability of transportation to all appointments :Yes. The patient's daughter will transport [facility] to start 03/04/24 for strengthening, increase stamina Continue with smoking cessation Wear Oxygen 4l/min continuous. Contact [facility] [phone] for portable oxygen  concentrator test.  - 03/18/24 - The patient has been started on Doxycycline    - Trial of Lasix  for 3 days and then PRN for LE edema   Patient Self Care Activities: (Reviewed 03/25/24) Attend all scheduled provider appointments Call pharmacy for medication refills 3-7 days in advance of running out of medications Call provider office for new concerns or questions  Notify RN Care Manager of [facility] call rescheduling needs Participate in Transition of Care Program/Attend [facility] scheduled calls Perform all self care activities independently  Take  medications as prescribed    Plan:  Telephone follow up appointment with care management team member scheduled for:  Tuesday September 2nd at 1:00pm        Patient verbalizes understanding of instructions and care plan provided today and agrees to view in MyChart. Active MyChart status and patient understanding of how to access instructions and care plan via MyChart confirmed with patient.     The patient has been provided with contact information for the care management team and has been advised to call with any health related questions or concerns.   Please call the care guide team at [phone] if you need to cancel or reschedule your appointment.   Please call the Suicide and Crisis Lifeline: 988 call the [place]  National Suicide Prevention Lifeline: 2065398414 or TTY: (403) 710-6322 TTY 306-173-3067) to talk to a trained counselor if you are experiencing a Mental Health or Behavioral Health Crisis or need someone to talk to.  Medford Balboa, BSN, RN [place]  VBCI - [employer] [facility] RN Care Manager [phone]

## 2024-03-25 NOTE — Transitions of Care (Post Inpatient/ED Visit) (Signed)
 Transition of Care week 4  Visit Note  03/25/2024  Name: Brett [name] MRN: [id number]          DOB: 1956-06-04  Situation: Patient enrolled in [facility] 30-day program. Visit completed with Markhi Perrott by telephone.   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 08/06/2023   Pain of left hip joint 11/01/2018   Paroxysmal SVT (supraventricular tachycardia) (HCC), incidental finding IntraOp back surgery requiring esmolol ; event monitor November 2024 for short runs, longest episode 17 beats. 06/19/2023   Event monitor November 2024, supraventricular ectopy burden 1.7%.  4 short runs of SVT, longest episode 17 beats.  No symptoms reported     Personal history of congenital hip dysplasia 05/30/2013   Preventative health care 01/29/2015   Rhinitis, allergic 09/22/2015   Rosacea 05/30/2013   Scoliosis deformity of spine 05/05/2020   Spinal stenosis of lumbar region 06/16/2023   Spondylolisthesis at L5-S1 level 09/14/2020   Tobacco abuse 05/30/2013    Assessment: Patient Reported  Symptoms: Cognitive Cognitive Status: Alert and oriented to person, place, and time      Neurological Neurological Review of Symptoms: No symptoms reported    HEENT HEENT Symptoms Reported: Other: HEENT Comment: States he blows a blood clot out of his nose every morning and then he is okay. He is using water based ointments    Cardiovascular Cardiovascular Symptoms Reported: Swelling in legs or feet Does patient have uncontrolled Hypertension?: No Cardiovascular Management Strategies: Medication therapy, Adequate rest Weight: 167 lb (75.8 kg) Cardiovascular Comment: The patient reports some swelling in his feet and is taking the Furosemide   Respiratory Respiratory Symptoms Reported: Dry cough, Shortness of breath Additional Respiratory Details: He is using his oxygen continuous right now. He would like to come off of it but he has to be able to pass the 6 minute walk test without his O2 sat dropping below 90 and is is unable to do that. He continues with his cough Respiratory Management Strategies: Oxygen therapy, Routine screening, Activity, Coping strategies  Endocrine Endocrine Symptoms Reported: No symptoms reported Is patient diabetic?: No    Gastrointestinal Gastrointestinal Symptoms Reported: No symptoms reported      Genitourinary Genitourinary Symptoms Reported: No symptoms reported    Integumentary Integumentary Symptoms Reported: Rash Additional Integumentary Details: Psoriasis Skin Management Strategies: Medication therapy, Routine screening, Adequate rest Skin Comment: Stela injection every three months for Psoriasis  Musculoskeletal Musculoskelatal Symptoms Reviewed: Back pain, Weakness Musculoskeletal Management Strategies: Medication therapy, Exercise, Routine screening Musculoskeletal Comment: Continues with opiods and PT  Psychosocial Psychosocial Symptoms Reported: No symptoms reported         There were no vitals filed for this visit.  Medications  Reviewed Today     Reviewed by [name], RN (Case Manager) on 03/25/24 at 1349  Med List Status: <None>   Medication Order Taking? Sig Documenting Provider Last Dose Status Informant  amiodarone  (PACERONE ) 200 MG tablet [id number]  Take 200 mg by mouth two times daily for 7 days; then take 200 mg by mouth daily thereafter [name], [name], PA-C  Active   apixaban  (ELIQUIS ) 5 MG TABS tablet [id number]  Take 1 tablet (5 mg total) by mouth 2 (two) times daily. [name] M, PA-C  Active   aspirin  EC 81 MG tablet [id number]  Take 1 tablet (81 mg total) by mouth daily. Swallow whole. [name], [name], MD  Active Self  calcium  carbonate (TUMS EX) 750 MG chewable tablet [id number]  Chew 1-2 tablets by mouth 2 (two) times daily as needed for heartburn. [provider]  Active Self  Clotrimazole  1 % OINT [id number]  Apply to affected area twice daily as needed [name], NP  Active   doxycycline  (VIBRA -TABS) 100 MG tablet [id number]  Take 1 tablet (100 mg total) by mouth 2 (two) times daily. [name], NP  Active   empagliflozin  (JARDIANCE ) 10 MG TABS tablet [id number]  Take 1 tablet (10 mg total) by mouth daily before breakfast. [name], [name], MD  Active Self  furosemide  (LASIX ) 20 MG tablet [id number]  Take 1 tablet (20 mg total) by mouth daily as needed for swelling [name], NP  Active   methocarbamol  (ROBAXIN ) 500 MG tablet [id number]  Take 1 tablet (500 mg total) by mouth every 8 (eight) hours as needed for muscle spasms. [name] M, PA-C  Active   metoprolol  succinate (TOPROL  XL) 25 MG 24 hr tablet [id number]  Take 1 tablet (25 mg total) by mouth daily. [name], [name], MD  Active Self  Multiple Vitamin (MULTIVITAMIN WITH MINERALS) TABS tablet [id number]  Take 1 tablet by mouth in the morning. [provider]  Active Self  Oxycodone  HCl 10 MG TABS [id number]  Take 1 tablet by mouth every 6 (six) hours as needed.  [provider]  Active   OXYGEN [id number]  Inhale 2 L/min into the lungs continuous.  Patient taking differently: Inhale 4 L/min into the lungs continuous.   [provider]  Active   STELARA 45 MG/0.5ML injection [id number]  Inject 45 mg into the skin as directed. Every 12 weeks [provider]  Active Self           Med Note CLAUD, [name] Feb 08, 2024 10:34 AM) Next dose due:02/15/24  Timolol  Maleate, Once-Daily, 0.5 % SOLN [id number]  Place 1 drop into both eyes in the morning and at bedtime. [provider]  Active Self  TRAVATAN Z 0.004 % SOLN ophthalmic solution [id number]  Place 1 drop into both eyes every morning. [provider]  Active Self           Med Note [name] Sep 03, 2020  2:43 PM)              Recommendation:   Continue Current Plan of Care  Follow Up Plan:   Telephone follow-up in 1 week  Medford Moises, BSN, RN [place]  VBCI - [facility] RN Care Manager [phone]

## 2024-03-26 ENCOUNTER — Ambulatory Visit

## 2024-03-26 ENCOUNTER — Ambulatory Visit: Admitting: Genetic Counselor

## 2024-03-26 ENCOUNTER — Ambulatory Visit: Attending: Internal Medicine | Admitting: Internal Medicine

## 2024-03-26 VITALS — BP 118/78 | HR 66 | Ht 74.0 in | Wt 166.4 lb

## 2024-03-26 DIAGNOSIS — I509 Heart failure, unspecified: Secondary | ICD-10-CM

## 2024-03-26 DIAGNOSIS — I429 Cardiomyopathy, unspecified: Secondary | ICD-10-CM

## 2024-03-26 DIAGNOSIS — I471 Supraventricular tachycardia, unspecified: Secondary | ICD-10-CM

## 2024-03-26 DIAGNOSIS — I4729 Other ventricular tachycardia: Secondary | ICD-10-CM

## 2024-03-26 HISTORY — DX: Heart failure, unspecified: I50.9

## 2024-03-26 MED ORDER — FUROSEMIDE 20 MG PO TABS: 20.0000 mg | ORAL_TABLET | Freq: Every day | ORAL | 3 refills | Status: DC

## 2024-03-26 NOTE — Patient Instructions (Addendum)
 Medication Instructions:  Your physician has recommended you make the following change in your medication:  Furosemide (Lasix ) 20 mg by mouth once daily  *If you need a refill on your cardiac medications before your next appointment, please call your pharmacy*  Lab Work: IN 2 WEEKS: BMP, BNP  If you have labs (blood work) drawn today and your tests are completely normal, you will receive your results only by: MyChart Message (if you have MyChart) OR A paper copy in the mail If you have any lab test that is abnormal or we need to change your treatment, we will call you to review the results.  Testing/Procedures: Your physician has requested that you have a Cardiac Sarcoid PET Scan.  Please follow instructions provided to you for testing.   Follow-Up: At [facility], you and your health needs are our priority.  As part of our continuing mission to provide you with exceptional heart care, our providers are all part of one team.  This team includes your primary Cardiologist (physician) and Advanced Practice Providers or APPs (Physician Assistants and Nurse Practitioners) who all work together to provide you with the care you need, when you need it.  Your next appointment:   1 month(s) Late Sept   Provider:   Stanly Leavens, MD

## 2024-03-26 NOTE — Progress Notes (Signed)
 Cardiology Office Note:  .    Date:  03/26/2024  ID:  Brett [name], DOB October 25, 1955, MRN [id number] PCP: [name], NP  [facility] Providers Cardiologist:  None     CC: Cardiac Sarcoidosis evaluation Consulted for the evaluation of Sarcoidosis at the behest of Dr. Liborio   History of Present Illness: .    Brett [name] is a 68 y.o. male with a history of CAD with normal PET MPI in 2025, PAF s/p AC and AAD (amiodarone ). HFrEF Etiology unclear with NSVT and elevated RF pending rheumatology eval.   Brett [name] is a 68 year old male with heart failure and pulmonary nodules who presents with persistent shortness of breath. He is accompanied by his daughter. He was referred by a specialist for cardiac sarcoidosis evaluation.  He experiences persistent shortness of breath, which he initially attributed to being status post lung surgery where he had a resection. He is currently on four liters of oxygen and walks with an assist, which is a new development for him. No chest pain is reported.  He reports a history of heart failure and non-sustained ventricular tachycardia. He has been intolerant to Entresto  postoperatively due to hypotension and hypokalemia. He is currently on metoprolol , which has not been increased due to tachybrady syndrome, and he tolerates Jardiance  well. He started Lasix  20 mg PRN with some improvement after two doses.  He underwent surgery for lung cancer and has been told he has multiple pulmonary nodules. He does not require chemotherapy or radiation at this time.  He notes chronic hip pain, which is different from before, and lower extremity swelling. His daughter also notes significant swelling in her feet. No weight gain is reported.  He has a family history of heart failure and ischemic cardiomyopathy related to coronary artery disease in his brother and mother. No history of non-ischemic cardiomyopathy or inherited cardiomyopathies  in the family.  He has an elevated RF factor and has experienced musculoskeletal pain and arthralgias but has not yet seen a rheumatologist.  Relevant histories: .  Social  - has a son and two daughters, one who is here today - mother had HF ROS: As per HPI.   Studies Reviewed: .     Cardiac Studies & Procedures   ______________________________________________________________________________________________   STRESS TESTS  NM PET CT CARDIAC PERFUSION MULTI W/ABSOLUTE BLOODFLOW 10/03/2023  Narrative   LV perfusion is normal. There is no evidence of ischemia. There is no evidence of infarction.   Rest left ventricular function is abnormal. Rest global function is moderately reduced. There were no regional wall motion abnormalities. Rest EF: 30%. Stress left ventricular function is abnormal. Stress global function is mildly reduced. There were no regional wall abnormalities. Stress EF: 46%. End diastolic cavity size is mildly enlarged. End systolic cavity size is mildly enlarged.   Myocardial blood flow was computed to be 0.92ml/g/min at rest and 2.52ml/g/min at stress. Global myocardial blood flow reserve was 2.59 and was normal.   Coronary calcium  was present on the attenuation correction CT images. Severe coronary calcifications were present. Coronary calcifications were present in the left anterior descending artery and right coronary artery distribution(s).   Findings are consistent with no ischemia and no infarction. The study is intermediate risk. based on EF note EF 50-55% on recent TTE can consider cardiac MRI to further assess   Normal pefusion and MBFR. Abnormal resting / stress EF Recent echo EF 50-55% Can consider f/u cardiac MRI to further assess  Severe calcium  noted in RCA/LAD  CLINICAL DATA:  This over-read does not include interpretation of PET data, cardiac/coronary anatomy or pathology. The Cardiac PET CT interpretation by the cardiologist is attached.  COMPARISON:   11/21/2022 lung cancer screening CT.  FINDINGS: No pleural fluid. Centrilobular emphysema. Left lower lobe 5 mm subpleural pulmonary nodule on 55/4 is not readily apparent on the prior exam.  Clustered left lower lobe nodules versus area of mucoid impaction at 3-4 mm x 2 on 36/4 are similar to on the prior.  Aortic atherosclerosis.  No imaged thoracic adenopathy.  No acute osseous abnormality.  IMPRESSION: 1.  No acute findings in the imaged extracardiac chest. 2. Left lower lobe pulmonary nodules, including a 5 mm nodule which is not readily apparent on the prior lung cancer screening CT. The patient is due for follow-up lung cancer screening CT on or after 11/21/2023. This nodule can be re-evaluated on that exam. 3. Aortic atherosclerosis (ICD10-I70.0) and emphysema (ICD10-J43.9).   Electronically Signed By: Rockey Kilts M.D. On: 10/03/2023 14:12   ECHOCARDIOGRAM  ECHOCARDIOGRAM COMPLETE 07/12/2023  Narrative ECHOCARDIOGRAM REPORT    Patient Name:   Brett [name] Date of Exam: 07/12/2023 Medical Rec #:  [id number]         Height:       74.0 in Accession #:    [id number]        Weight:       152.0 lb Date of Birth:  Feb 02, 1956        BSA:          1.934 m Patient Age:    7 years          BP:           134/72 mmHg Patient Gender: M                 HR:           70 bpm. Exam Location:  [place]  Procedure: 2D Echo, 3D Echo, Cardiac Doppler and Color Doppler  Indications:    Paroxysmal SVT (supraventricular tachycardia) (HCC) [I47.10 (ICD-10-CM)]  History:        Patient has no prior history of Echocardiogram examinations. Arrythmias:Tachycardia; Risk Factors:Current Smoker.  Sonographer:    Alan Greenhouse RDMS, RVT, RDCS Referring Phys: [id number] [name] [name]  IMPRESSIONS   1. Left ventricular ejection fraction, by estimation, is 50 to 55%. The left ventricle has low normal function. The left ventricle has no regional wall motion abnormalities.  Left ventricular diastolic parameters are consistent with Grade I diastolic dysfunction (impaired relaxation). 2. Right ventricular systolic function is normal. The right ventricular size is normal. 3. The mitral valve is normal in structure. No evidence of mitral valve regurgitation. No evidence of mitral stenosis. 4. The aortic valve is normal in structure. Aortic valve regurgitation is not visualized. No aortic stenosis is present. 5. The inferior vena cava is normal in size with greater than 50% respiratory variability, suggesting right atrial pressure of 3 mmHg.  FINDINGS Left Ventricle: Left ventricular ejection fraction, by estimation, is 50 to 55%. The left ventricle has low normal function. The left ventricle has no regional wall motion abnormalities. The left ventricular internal cavity size was normal in size. There is no left ventricular hypertrophy. Left ventricular diastolic parameters are consistent with Grade I diastolic dysfunction (impaired relaxation).  Right Ventricle: The right ventricular size is normal. No increase in right ventricular wall thickness. Right ventricular systolic function is normal.  Left Atrium:  Left atrial size was normal in size.  Right Atrium: Right atrial size was normal in size.  Pericardium: There is no evidence of pericardial effusion.  Mitral Valve: The mitral valve is normal in structure. No evidence of mitral valve regurgitation. No evidence of mitral valve stenosis.  Tricuspid Valve: The tricuspid valve is normal in structure. Tricuspid valve regurgitation is not demonstrated. No evidence of tricuspid stenosis.  Aortic Valve: The aortic valve is normal in structure. Aortic valve regurgitation is not visualized. No aortic stenosis is present. Aortic valve mean gradient measures 3.0 mmHg. Aortic valve peak gradient measures 4.6 mmHg. Aortic valve area, by VTI measures 1.77 cm.  Pulmonic Valve: The pulmonic valve was normal in structure.  Pulmonic valve regurgitation is not visualized. No evidence of pulmonic stenosis.  Aorta: The aortic root is normal in size and structure.  Venous: The inferior vena cava is normal in size with greater than 50% respiratory variability, suggesting right atrial pressure of 3 mmHg.  IAS/Shunts: No atrial level shunt detected by color flow Doppler.   LEFT VENTRICLE PLAX 2D LVIDd:         4.70 cm     Diastology LVIDs:         3.60 cm     LV e' medial:    8.27 cm/s LV PW:         1.30 cm     LV E/e' medial:  6.3 LV IVS:        0.70 cm     LV e' lateral:   7.94 cm/s LVOT diam:     2.00 cm     LV E/e' lateral: 6.5 LV SV:         43 LV SV Index:   22 LVOT Area:     3.14 cm  3D Volume EF: LV Volumes (MOD)           3D EF:        50 % LV vol d, MOD A2C: 77.5 ml LV EDV:       120 ml LV vol d, MOD A4C: 75.2 ml LV ESV:       60 ml LV vol s, MOD A2C: 40.1 ml LV SV:        61 ml LV vol s, MOD A4C: 38.8 ml LV SV MOD A2C:     37.4 ml LV SV MOD A4C:     75.2 ml LV SV MOD BP:      37.5 ml  RIGHT VENTRICLE RV S prime:     10.70 cm/s TAPSE (M-mode): 2.0 cm  LEFT ATRIUM             Index        RIGHT ATRIUM           Index LA diam:        3.20 cm 1.65 cm/m   RA Area:     12.10 cm LA Vol (A2C):   41.6 ml 21.51 ml/m  RA Volume:   27.00 ml  13.96 ml/m LA Vol (A4C):   41.2 ml 21.30 ml/m LA Biplane Vol: 41.9 ml 21.66 ml/m AORTIC VALVE AV Area (Vmax):    1.97 cm AV Area (Vmean):   2.03 cm AV Area (VTI):     1.77 cm AV Vmax:           107.00 cm/s AV Vmean:          80.600 cm/s AV VTI:  0.243 m AV Peak Grad:      4.6 mmHg AV Mean Grad:      3.0 mmHg LVOT Vmax:         67.10 cm/s LVOT Vmean:        52.200 cm/s LVOT VTI:          0.137 m LVOT/AV VTI ratio: 0.56  AORTA Ao Root diam: 3.60 cm Ao Asc diam:  3.00 cm  MITRAL VALVE               TRICUSPID VALVE MV Area (PHT): 3.23 cm    TR Peak grad:   18.3 mmHg MV Decel Time: 235 msec    TR Vmax:        214.00 cm/s MR Peak  grad: 11.4 mmHg MR Vmax:      169.00 cm/s  SHUNTS MV E velocity: 52.00 cm/s  Systemic VTI:  0.14 m MV A velocity: 56.90 cm/s  Systemic Diam: 2.00 cm MV E/A ratio:  0.91  Jennifer Crape MD Electronically signed by Jennifer Crape MD Signature Date/Time: 07/12/2023/5:08:36 PM    Final    MONITORS  LONG TERM MONITOR (3-14 DAYS) 07/10/2023  Narrative   Predominantly sinus rhythm, average heart rate 87/min [ranging from 55 bpm to 113 bpm].   Supraventricular ectopy burden 1.7%, with 4 short runs of supraventricular tachycardia with the longest episode lasting 17 beats.   Ventricular ectopy burden less than 1%, with one short run of nonsustained ventricular tachycardia lasting 10 beats.   No symptoms reported and no patient triggered events.   Patch Wear Time:  13 days and 21 hours (2024-11-11T12:16:29-0500 to 2024-11-25T09:52:52-0500)  Patient had a min HR of 55 bpm, max HR of 214 bpm, and avg HR of 87 bpm. Predominant underlying rhythm was Sinus Rhythm. 1 run of Ventricular Tachycardia occurred lasting 10 beats with a max rate of 214 bpm (avg 177 bpm). 4 Supraventricular Tachycardia runs occurred, the run with the fastest interval lasting 17 beats with a max rate of 197 bpm (avg 187 bpm); the run with the fastest interval was also the longest. Isolated SVEs were occasional (1.7%, 71346), SVE Couplets were rare (<1.0%, 5242), and SVE Triplets were rare (<1.0%, 668). Isolated VEs were rare (<1.0%), VE Couplets were rare (<1.0%), and no VE Triplets were present. Ventricular Bigeminy and Trigeminy were present. No patient triggered events.     CARDIAC MRI  MR CARDIAC MORPHOLOGY W WO CONTRAST 11/15/2023  Narrative CLINICAL DATA:  Cardiomyopathy Decreased EF  EXAM: CARDIAC MRI  TECHNIQUE: The patient was scanned on a 1.5 Tesla Siemens magnet. A dedicated cardiac coil was used. Functional imaging was done using Fiesta sequences. 2,3, and 4 chamber views were done to assess for  RWMA's. Modified Simpson's rule using a short axis stack was used to calculate an ejection fraction on a dedicated work [employer]. The patient received 10 cc of Gadavist . After 10 minutes inversion recovery sequences were used to assess for infiltration and scar tissue.  CONTRAST:  Gadavist   FINDINGS: Normal atrial sizes. No PFO/ASD. No pericardial effusion. No LAA thrombus. Normal ascending thoracic aorta 3.3 cm. Tri leaflet AV normal. Mild MV thickening with trivial MR. Normal myocardial thickness septum 8 mm. Mild LVE with global hypokinesis Normal RV size and function  Quantitative LVEF: 43% (EDV 142 cc ESV 81 cc SV 62 cc) Estimated cardiac output by flow analysis 3.1 L/min  Quantitative RVEF: 55% (EDV 118 cc ESV 54 cc SV 65 cc )  Delayed gadolinium  images show a small focal area of subendocardial/mid myocardia uptake in the basal anterolateral wall  And mid myocardial uptake in the basal antero septum.  Parametric measures using Hct 44  T1 mildly elevated 1074 msec  ECV mildly elevated 32%  T2 normal 52 msec  IMPRESSION: 1. Mild LVE with global hypokinesis LVEF 43%  2.  Normal RV size and function RVEF 55%  3. Abnormal gadolinium uptake in basal anterior lateral wall and basal anteroseptal may represent sequelae of old myocarditis or possibly sarcoid. T2 is normal indicating non acute inflammation  4.  Mildly elevated ECV/T1 non in amyloid range  5.  Estimated cardiac output 3.1 L/min  6.  Mild MV thickening with trivial MR  Maude Emmer   Electronically Signed By: Maude Emmer M.D. On: 11/15/2023 13:15   ______________________________________________________________________________________________        Physical Exam:    VS:  BP 118/78   Pulse 66   Ht 6' 2 (1.88 m)   Wt 166 lb 6.4 oz (75.5 kg)   SpO2 93% Comment: 4L  BMI 21.36 kg/m    Wt Readings from Last 3 Encounters:  03/26/24 166 lb 6.4 oz (75.5 kg)  03/25/24  167 lb (75.8 kg)  03/14/24 167 lb (75.8 kg)    Gen: no distress  Neck: No JVD Cardiac: No Rubs or Gallops, no Murmur, RRR +2radial pulses Respiratory: Clear to auscultation bilaterally save for decreased breath sounds , normal effort, normal  respiratory rate GI: Soft, nontender, non-distended  MS: +1 bilateral  edema;  moves all extremities Integument: Skin feels warn Neuro:  At time of evaluation, alert and oriented to person/place/time/situation  Psych: Normal affect, patient feels fair   ASSESSMENT AND PLAN: .      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 Family history of heart failure related to coronary artery disease. Genetic testing warranted due to arrhythmogenic disease and familial history. 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 Calcified granulomas on CT imaging. Differential includes cardiac sarcoidosis versus heart failure laminopathy. PET MPI test considered worthwhile. - Order PET MPI test - Send courtesy message to pulmonologist and Dr. Kerrin to check for evidence of granulomas or sarcoidosis in biopsies - Consider left and right heart catheterization if sarcoid study is negative -  Consider starting therapy for cardiac sarcoidosis if PET study is positive  Pulmonary nodules, status post lung resection for history of lung cancer - Status post lung resection for bronchogenic carcinoma. No need for chemotherapy or radiation at this time. Persistent dyspnea attributed to post-surgical status. - Chronic hypoxemia requiring 4 liters of supplemental oxygen. Walks with assistance, which is a new development.  History of hypokalemia and hyperkalemia Current management avoids spironolactone due to hypokalemia and hyperkalemia.  Planned for f/u after PET INFLAMMATORY protocol; either cath or tx  Time Spent Directly  with Patient:   I have spent a total of 52 minutes with the patient reviewing notes, imaging, EKGs, labs,  and examining the patient as well as establishing an assessment and plan that was discussed personally with the patient. Discussed disease state education, reviewed CMR, and reviewed care with daughter.   Stanly Leavens, MD FASE [facility] Cardiologist [facility]  [address], [address] [place], [place] [address] [phone]  4:59 PM

## 2024-03-28 ENCOUNTER — Ambulatory Visit

## 2024-03-30 ENCOUNTER — Other Ambulatory Visit: Payer: Self-pay | Admitting: Family

## 2024-03-30 DIAGNOSIS — Z720 Tobacco use: Secondary | ICD-10-CM

## 2024-03-30 MED ORDER — VARENICLINE TARTRATE 1 MG PO TABS: 1.0000 mg | ORAL_TABLET | Freq: Two times a day (BID) | ORAL | 1 refills | Status: DC

## 2024-04-01 ENCOUNTER — Encounter (HOSPITAL_COMMUNITY): Payer: Self-pay

## 2024-04-02 ENCOUNTER — Telehealth (HOSPITAL_COMMUNITY): Payer: Self-pay | Admitting: *Deleted

## 2024-04-02 ENCOUNTER — Ambulatory Visit: Attending: Genetic Counselor | Admitting: Genetic Counselor

## 2024-04-02 DIAGNOSIS — Z902 Acquired absence of lung [part of]: Secondary | ICD-10-CM | POA: Diagnosis not present

## 2024-04-02 DIAGNOSIS — J449 Chronic obstructive pulmonary disease, unspecified: Secondary | ICD-10-CM | POA: Diagnosis not present

## 2024-04-02 DIAGNOSIS — I429 Cardiomyopathy, unspecified: Secondary | ICD-10-CM

## 2024-04-02 DIAGNOSIS — I4729 Other ventricular tachycardia: Secondary | ICD-10-CM

## 2024-04-02 NOTE — Telephone Encounter (Signed)
 Reaching out to patient to offer assistance regarding upcoming cardiac imaging study; pt verbalizes understanding of appt date/time, parking situation and where to check in, pre-test NPO status; name and call back number provided for further questions should they arise  Larey Brick RN Navigator Cardiac Imaging [facility] [phone] office [phone] cell  Patient verbalized understanding of diet prep.

## 2024-04-03 NOTE — Progress Notes (Signed)
 Genetic Consult  Pre-Test Genetic Consult  Referral Reason  Brett [name] was referred for genetic consult and testing for Arrhythmogenic cardiomyopathy (ACM) due to arrhythmogenic disease and to rule out sarcoidosis.  Personal Medical Information Brett [name] (III.2 on pedigree) is a 68 y.o. retired Caucasian gentleman who is here today with his daughter, Brett [name]. In October 2024, he reports having Afib while under anesthesia for a spinal surgery. He was referred to a cardiologist and underwent cardiac imaging studies that detected heart failure with NSVT, etiology is unclear. 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 Differential includes cardiac sarcoidosis versus heart failure laminopathy.  A CT scan detected a nodule on his lung and was referred to a pulmonologist for further workup when he was found to have lung cancer and underwent surgery to resect the lower left lobe of his lung. He experiences persistent shortness of breath, which he initially attributed to being status post lung surgery where he had a resection. He is currently on four liters of oxygen and walks with an assist.  Traditional Risk Factors Brett [name] denies having other cardiac or systemic conditions that can cause non-ischemic cardiomyopathy, namely, ischemic heart disease, myocarditis, infiltrative myocardial disease (amyloidosis, sarcoidosis, hemochromatosis), HTN, alcohol or drug abuse, infection with HIV virus, connective tissue disease (such as systemic lupus erythematosus etc.), doxorubicin therapy and of having other cardiovascular diseases (valvular heart disease, HCM).   Family history Relation to [facility]band Pedigree # Current age Heart condition/age of onset Notes  Daughters, 2 IV.2, IV.4 44, 34 None IV.2- POTS- related fainting Brett [name])  Son IV.3 42 None Echo/EKG to be done  Grandkids, 2 V.1, V.2 12, 5  5 y.o. w/ autism and hypotonia-  informed about [facility] for genetics eval.        Father II.4 Deceased None Died @ 65- pulmonary fibrosis  Paternal uncle II.3 Deceased None Died @ 61- pulmonary fibrosis  Paternal aunts, 2 II.1, II.2 78 Deceased None  II.2- Die d@ 53- cancer, pulmonary fibrosis  Paternal grandfather I.1 Deceased None Found dead on the toilet @ 15 by wife  Paternal grandmother I.2 Deceased Heart issues-details? Died of pulmonary fibrosis @ 55         Mother II.5 Deceased CHF @ 93s CAD- stents @ 60s Heart valve disease @ 51 Died of old age @ 41   Maternal aunts, 6 II.6-II.11 Deceased None Died @ 88s- old age  Maternal uncle II.12 Deceased None Died of M.I. ? @ 26s- sudden death?  Maternal grandfather I.3 Deceased Unaware d. of ?  @ 74s  Maternal grandmother I.4 Deceased None Died @ 42- colon cancer    Genetic Consult notes  I reviewed the genetics and clinical presentation of Arrhythmogenic cardiomyopathy (ACM) that includes not only the classical right ventricular dominant form (ARVC), but also biventricular and left ventricular dominant forms such as arrhythmogenic left ventricular cardiomyopathy (ALVC). As this condition is associated with incomplete penetrance and variability in clinical expression, patients present with mild symptoms to adverse events of sudden cardiac death. Mutations in proteins involved in desmosomal junctions, nuclear envelop proteins and cardiac channel proteins have been observed in patients with ACM.  I explained to the patient that this is an autosomal dominant condition with incomplete penetrance i.e. not all individuals harboring a mutation will present clinically with cardiomyopathy. Disease penetrance and variability in clinical expression varies widely between families and the prevalence of ACM differs greatly among the family members.   Since  ACM is an autosomal dominant condition, first degree-relatives are at a 50% risk of inheriting this condition and should seek regular  surveillance.  First-degree relatives include his son and daughter. At this time her grandchildren do not need to undergo screening- they can be screened if they are symptomatic or if their parent is found to have ACM.  Clinical screening of first-degree relatives is recommended based on current guidelines. Patient verbalized understanding of this.  Patient should be aware that genetic testing is a probabilistic test dependent upon age and severity of presentation, presence of risk factors for ACM and importantly family history of ACM, heart failure, Heart transplant or sudden death in first-degree relatives. The potential outcomes of genetic testing and subsequent management of at-risk family members is listed below-  If a mutation is not identified, then all first-degree relatives should undergo regular screening for ACM. I emphasized that a negative genetic could be due to limitations of the genetic test. In his case, a negative test may indicate sarcoidosis especially if his PET scan shows active inflammatory response.   There is also the likelihood of identifying a "Variant of unknown significance". This result means that the variant has not been detected in a statistically significant number of HCM patients and/or functional studies have not been performed to verify its pathogenicity. This VUS can be tested in the family to see if it segregates with disease. If a VUS is found, first-degree relatives should undergo regular clinical screening for NICM.  If a pathogenic variant is reported, then first-degree family members can get tested for this variant. If they test positive, it is likely they will develop ACM. In light of variable expression and incomplete penetrance associated with this condition, it is not possible to predict when they will manifest clinically. It is recommended that family members that test positive for the familial pathogenic variant pursue clinical screening for ACM. Family  members that test negative for the familial mutation are deferred from further screening.  Impression  In summary, Brett [name]'s clinical presentation in the absence of risk factors and family history of CHF and sudden death is suggestive of a genetic condition.   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 The test will determine the underlying genetic basis of her condition, stratify risk of the inherited cardiomyopathy in her family and initiate appropriate management strategies for relatives that harbor  the pathogenic variant.  In addition, we discussed the protections afforded by the Genetic Information Non-Discrimination Act (GINA). I explained to the patient that GINA protects them from losing their employment or health insurance based on their genotype. However, these protections do not cover life insurance and disability. Explained to the patient that family members that are found to have the familial genetic mutation will be denied life insurance even if they are asymptomatic and do not exhibit clinical signs of ACM. Patient verbalized understanding and will discuss this with their family.   Please note that the patient has not been counseled in this visit on personal, cultural, or ethical issues that they may face due to their heart condition.   Plan After a thorough discussion of the risk and benefits of genetic testing for ACM, Brett [name] expresses interest in pursuing genetic testing for ACM and signed the informed consent. Blood was drawn today for ACM genetic testing.   Brett [name], Ph.D, [facility] Clinical Molecular Geneticist

## 2024-04-04 ENCOUNTER — Encounter (HOSPITAL_COMMUNITY)
Admission: RE | Admit: 2024-04-04 | Discharge: 2024-04-04 | Disposition: A | Discharge status: Home / Self Care | Source: Ambulatory Visit | Attending: Internal Medicine | Admitting: Internal Medicine

## 2024-04-04 DIAGNOSIS — I429 Cardiomyopathy, unspecified: Secondary | ICD-10-CM | POA: Insufficient documentation

## 2024-04-04 DIAGNOSIS — I509 Heart failure, unspecified: Secondary | ICD-10-CM | POA: Diagnosis not present

## 2024-04-04 LAB — NM PET CT MYOCARDIAL SARCOIDOSIS
LV dias vol: 104 mL (ref 62–150)
Nuc Stress EF: 54 %
Rest Nuclear Isotope Dose: 19.4 mCi

## 2024-04-04 MED ORDER — RUBIDIUM RB82 GENERATOR (RUBYFILL)
19.3800 | PACK | Freq: Once | INTRAVENOUS | Status: AC
  Administered 2024-04-04: 19.38 via INTRAVENOUS

## 2024-04-04 MED ORDER — FLUDEOXYGLUCOSE F - 18 (FDG) INJECTION
8.9500 | Freq: Once | INTRAVENOUS | Status: AC
  Administered 2024-04-04: 8.95 via INTRAVENOUS

## 2024-04-09 ENCOUNTER — Ambulatory Visit: Payer: Self-pay

## 2024-04-09 ENCOUNTER — Other Ambulatory Visit: Payer: Self-pay

## 2024-04-09 NOTE — Transitions of Care (Post Inpatient/ED Visit) (Signed)
 Transition of Care week 5  Visit Note  04/09/2024  Name: Brett [name] MRN: [id number]          DOB: Dec 11, 1955  Situation: Patient enrolled in [facility] 30-day program. Visit completed with [name] by telephone.   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 08/06/2023   Pain of left hip joint 11/01/2018   Paroxysmal SVT (supraventricular tachycardia) (HCC), incidental finding IntraOp back surgery requiring esmolol ; event monitor November 2024 for short runs, longest episode 17 beats. 06/19/2023   Event monitor November 2024, supraventricular ectopy burden 1.7%.  4 short runs of SVT, longest episode 17 beats.  No symptoms reported     Personal history of congenital hip dysplasia 05/30/2013   Preventative health care 01/29/2015   Rhinitis, allergic 09/22/2015   Rosacea 05/30/2013   Scoliosis deformity of spine 05/05/2020   Spinal stenosis of lumbar region 06/16/2023   Spondylolisthesis at L5-S1 level 09/14/2020   Tobacco abuse 05/30/2013    Assessment: Patient Reported  Symptoms: Cognitive Cognitive Status: Alert and oriented to person, place, and time      Neurological Neurological Review of Symptoms: No symptoms reported    HEENT HEENT Symptoms Reported: No symptoms reported      Cardiovascular Cardiovascular Symptoms Reported: Swelling in legs or feet Does patient have uncontrolled Hypertension?: No Cardiovascular Management Strategies: Medication therapy, Adequate rest Weight: 166 lb (75.3 kg) Cardiovascular Comment: Taking Lasix  daily  Respiratory Respiratory Symptoms Reported: Dry cough, Shortness of breath Additional Respiratory Details: The patient is on oxygen  4L continuous. He continues to work with HHPT Respiratory Management Strategies: Oxygen  therapy, Routine screening, Activity, Coping strategies  Endocrine Endocrine Symptoms Reported: No symptoms reported    Gastrointestinal Gastrointestinal Symptoms Reported: No symptoms reported      Genitourinary Genitourinary Symptoms Reported: No symptoms reported    Integumentary Integumentary Symptoms Reported: No symptoms reported    Musculoskeletal Musculoskelatal Symptoms Reviewed: Back pain, Weakness Musculoskeletal Management Strategies: Medication therapy, Exercise, Routine screening Musculoskeletal Comment: Continues with opioids and PT      Psychosocial Psychosocial Symptoms Reported: No symptoms reported         There were no vitals filed for this visit.  Medications Reviewed Today     Reviewed by [name], RN (Case Manager) on 04/09/24 at 1335  Med List Status: <None>   Medication Order Taking? Sig Documenting Provider Last Dose Status Informant  amiodarone  (PACERONE ) 200 MG tablet [id number]  Take 200 mg by mouth two times daily for 7 days; then take 200 mg by mouth daily thereafter [name],  [name], PA-C  Active   apixaban  (ELIQUIS ) 5 MG TABS tablet [id number]  Take 1 tablet (5 mg total) by mouth 2 (two) times daily. [name] M, PA-C  Active    aspirin  EC 81 MG tablet [id number]  Take 1 tablet (81 mg total) by mouth daily. Swallow whole. [name], [name], MD  Active Self  calcium  carbonate (TUMS EX) 750 MG chewable tablet [id number]  Chew 1-2 tablets by mouth 2 (two) times daily as needed for heartburn. [provider]  Active Self  Clotrimazole  1 % OINT [id number]  Apply to affected area twice daily as needed O'Sullivan, Melissa, NP  Active   doxycycline  (VIBRA -TABS) 100 MG tablet [id number]  Take 1 tablet (100 mg total) by mouth 2 (two) times daily. [name], NP  Active   empagliflozin  (JARDIANCE ) 10 MG TABS tablet [id number]  Take 1 tablet (10 mg total) by mouth daily before breakfast. [name], [name], MD  Active Self  furosemide  (LASIX ) 20 MG tablet [id number]  Take 1 tablet (20 mg total) by mouth daily. Santo Stanly LABOR, MD  Active   methocarbamol  (ROBAXIN ) 500 MG tablet [id number]  Take 1 tablet (500 mg total) by mouth every 8 (eight) hours as needed for muscle spasms. [name] M, PA-C  Active   metoprolol  succinate (TOPROL  XL) 25 MG 24 hr tablet [id number]  Take 1 tablet (25 mg total) by mouth daily. [name], [name], MD  Active Self  Multiple Vitamin (MULTIVITAMIN WITH MINERALS) TABS tablet [id number]  Take 1 tablet by mouth in the morning. [provider]  Active Self  Oxycodone  HCl 10 MG TABS [id number]  Take 1 tablet by mouth every 6 (six) hours as needed. [provider]  Active   OXYGEN  [id number]  Inhale 2 L/min into the lungs continuous.  Patient taking differently: Inhale 4 L/min into the lungs continuous.   [provider]  Active   STELARA 45 MG/0.5ML injection [id number]  Inject 45 mg into the skin as directed. Every 12 weeks [provider]  Active Self           Med Note CLAUD, [name] Feb 08, 2024 10:34 AM) Next dose due:02/15/24  Timolol  Maleate, Once-Daily, 0.5 % SOLN [id number]  Place 1 drop into both eyes in the morning and at  bedtime. [provider]  Active Self  TRAVATAN Z 0.004 % SOLN ophthalmic solution [id number]  Place 1 drop into both eyes every morning. [provider]  Active Self           Med Note JERALYN, ROCKEY A   Thu Sep 03, 2020  2:43 PM)    varenicline  (CHANTIX  CONTINUING MONTH PAK) 1 MG tablet [id number]  Take 1 tablet (1 mg total) by mouth 2 (two) times daily. [name], NP  Active             Recommendation:   Discharge from [facility]  Follow Up Plan:   Closing From:  Transitions of Care Program  [facility], BSN, RN [place]  VBCI - [facility] RN Care Manager [phone]

## 2024-04-09 NOTE — Patient Instructions (Signed)
 Visit Information  Thank you for taking time to visit with me today. Please don't hesitate to contact me if I can be of assistance to you before our next scheduled telephone appointment.   Following is a copy of your care plan:   Goals Addressed             This Visit's Progress    COMPLETED: VBCI Transitions of Care (TOC) Care Plan       Problems: (reviewed 04/09/24) Recent Hospitalization for treatment of Pulmonary Disease: Lung Cancer, Lobectomy  Goal: (reviewed 04/09/24) Over the next 30 days, the patient will not experience hospital readmission  Interventions: (reviewed 04/09/24)  Surgery (02/19/24): Evaluation of current treatment plan related to Lobectomy due to Lung Cancer surgery assessed patient/caregiver understanding of surgical procedure   reviewed post-operative instructions with patient/caregiver reviewed medications with patient and addressed questions reviewed scheduled provider appointments with patient: Follow Up with Thoracic Surgeon Elspeth Leek 03/05/24. - rescheduled to 03/12/24 confirmed availability of transportation to all appointments :Yes. The patient's daughter will transport [facility] to start 03/04/24 for strengthening, increase stamina Continue with smoking cessation Wear Oxygen  4l/min continuous. Contact [facility] [phone] for portable oxygen   concentrator test.  - 03/18/24 - The patient has been started on Doxycycline    - Trial of Lasix  for 3 days and then PRN for LE edema - Lasix  20mg  daily   Patient Self Care Activities: (reviewed 04/09/24) Attend all scheduled provider appointments Call pharmacy for medication refills 3-7 days in advance of running out of medications Call provider office for new concerns or questions  Notify RN Care Manager of TOC call rescheduling needs Participate in Transition of Care Program/Attend TOC scheduled calls Perform all self care activities independently  Take medications as prescribed    Plan:  Telephone  follow up appointment with care management team member scheduled for:  The patient has met his goals. He is disenrolled from the 30 day TOC program        Patient verbalizes understanding of instructions and care plan provided today and agrees to view in MyChart. Active MyChart status and patient understanding of how to access instructions and care plan via MyChart confirmed with patient.     The patient has been provided with contact information for the care management team and has been advised to call with any health related questions or concerns.   Please call the care guide team at [phone] if you need to cancel or reschedule your appointment.   Please call the Suicide and Crisis Lifeline: 988 call the [place]  National Suicide Prevention Lifeline: 920 561 2143 or TTY: (239) 682-3660 TTY 517-534-5701) to talk to a trained counselor if you are experiencing a Mental Health or Behavioral Health Crisis or need someone to talk to.  Medford Balboa, BSN, RN [place]  VBCI - [employer] [facility] RN Care Manager [phone]

## 2024-04-10 ENCOUNTER — Telehealth: Payer: Self-pay | Admitting: Family

## 2024-04-10 NOTE — Telephone Encounter (Signed)
 Copied from CRM #[id number]. Topic: Medicare AWV >> Apr 10, 2024  9:35 AM Nathanel DEL wrote: Reason for CRM: Called LVM 04/10/2024 to schedule AWV. Please schedule office or virtual visits.  Nathanel Paschal; Care Guide Ambulatory Clinical Support [facility] Direct Dial: [phone]

## 2024-04-11 DIAGNOSIS — M4316 Spondylolisthesis, lumbar region: Secondary | ICD-10-CM | POA: Diagnosis not present

## 2024-04-12 DIAGNOSIS — I429 Cardiomyopathy, unspecified: Secondary | ICD-10-CM | POA: Diagnosis not present

## 2024-04-12 DIAGNOSIS — I509 Heart failure, unspecified: Secondary | ICD-10-CM | POA: Diagnosis not present

## 2024-04-13 LAB — BASIC METABOLIC PANEL WITH GFR
BUN/Creatinine Ratio: 16 (ref 10–24)
BUN: 18 mg/dL (ref 8–27)
CO2: 26 mmol/L (ref 20–29)
Calcium: 9.1 mg/dL (ref 8.6–10.2)
Chloride: 97 mmol/L (ref 96–106)
Creatinine, Ser: 1.14 mg/dL (ref 0.76–1.27)
Glucose: 94 mg/dL (ref 70–99)
Potassium: 4.7 mmol/L (ref 3.5–5.2)
Sodium: 138 mmol/L (ref 134–144)
eGFR: 70 mL/min/1.73 (ref >59)

## 2024-04-13 LAB — PRO B NATRIURETIC PEPTIDE: NT-Pro BNP: 525 pg/mL — ABNORMAL HIGH (ref 0–376)

## 2024-04-17 ENCOUNTER — Ambulatory Visit (INDEPENDENT_AMBULATORY_CARE_PROVIDER_SITE_OTHER): Admitting: Student in an Organized Health Care Education/Training Program

## 2024-04-17 ENCOUNTER — Encounter: Payer: Self-pay | Admitting: Student in an Organized Health Care Education/Training Program

## 2024-04-17 VITALS — BP 122/72 | HR 77 | Temp 97.5°F | Ht 74.0 in | Wt 171.6 lb

## 2024-04-17 DIAGNOSIS — J432 Centrilobular emphysema: Secondary | ICD-10-CM | POA: Diagnosis not present

## 2024-04-17 DIAGNOSIS — J9611 Chronic respiratory failure with hypoxia: Secondary | ICD-10-CM

## 2024-04-17 DIAGNOSIS — I509 Heart failure, unspecified: Secondary | ICD-10-CM

## 2024-04-17 DIAGNOSIS — C3492 Malignant neoplasm of unspecified part of left bronchus or lung: Secondary | ICD-10-CM

## 2024-04-17 MED ORDER — FLUTICASONE-SALMETEROL 100-50 MCG/ACT IN AEPB: 1.0000 | INHALATION_SPRAY | Freq: Two times a day (BID) | RESPIRATORY_TRACT | 11 refills | Status: AC

## 2024-04-17 NOTE — Progress Notes (Unsigned)
 Synopsis: Referred in *** by Isadora Hose, MD  Assessment & Plan:   1. Acute on chronic heart failure, unspecified heart failure type (HCC) 2. Centrilobular emphysema (HCC) (Primary)  Will initiate ICS/LABA, LAMA contraindicated given glaucoma. Will refer to pulm rehab.  - AMB referral to pulmonary rehabilitation - fluticasone -salmeterol (WIXELA INHUB) 100-50 MCG/ACT AEPB; Inhale 1 puff into the lungs 2 (two) times daily.  Dispense: 60 each; Refill: 11   No follow-ups on file.  I spent *** minutes caring for this patient today, including {EM billing:28027}  Hose Isadora, MD [facility] 04/17/2024 12:02 PM    End of visit medications:  Meds ordered this encounter  Medications   fluticasone -salmeterol (WIXELA INHUB) 100-50 MCG/ACT AEPB    Sig: Inhale 1 puff into the lungs 2 (two) times daily.    Dispense:  60 each    Refill:  11     Current Outpatient Medications:    amiodarone  (PACERONE ) 200 MG tablet, Take 200 mg by mouth two times daily for 7 days; then take 200 mg by mouth daily thereafter, Disp: 60 tablet, Rfl: 1   apixaban  (ELIQUIS ) 5 MG TABS tablet, Take 1 tablet (5 mg total) by mouth 2 (two) times daily., Disp: 60 tablet, Rfl: 1   aspirin  EC 81 MG tablet, Take 1 tablet (81 mg total) by mouth daily. Swallow whole., Disp: , Rfl:    calcium  carbonate (TUMS EX) 750 MG chewable tablet, Chew 1-2 tablets by mouth 2 (two) times daily as needed for heartburn., Disp: , Rfl:    Clotrimazole  1 % OINT, Apply to affected area twice daily as needed, Disp: 56.7 g, Rfl: 0   empagliflozin  (JARDIANCE ) 10 MG TABS tablet, Take 1 tablet (10 mg total) by mouth daily before breakfast., Disp: 90 tablet, Rfl: 1   fluticasone -salmeterol (WIXELA INHUB) 100-50 MCG/ACT AEPB, Inhale 1 puff into the lungs 2 (two) times daily., Disp: 60 each, Rfl: 11   furosemide  (LASIX ) 20 MG tablet, Take 1 tablet (20 mg total) by mouth daily., Disp: 90 tablet, Rfl: 3   metoprolol  succinate  (TOPROL  XL) 25 MG 24 hr tablet, Take 1 tablet (25 mg total) by mouth daily., Disp: 90 tablet, Rfl: 3   Multiple Vitamin (MULTIVITAMIN WITH MINERALS) TABS tablet, Take 1 tablet by mouth in the morning., Disp: , Rfl:    Oxycodone  HCl 10 MG TABS, Take 1 tablet by mouth every 6 (six) hours as needed., Disp: , Rfl:    OXYGEN , Inhale 2 L/min into the lungs continuous. (Patient taking differently: Inhale 4 L/min into the lungs continuous.), Disp: , Rfl:    STELARA 45 MG/0.5ML injection, Inject 45 mg into the skin as directed. Every 12 weeks, Disp: , Rfl:    Timolol  Maleate, Once-Daily, 0.5 % SOLN, Place 1 drop into both eyes in the morning and at bedtime., Disp: , Rfl:    TRAVATAN Z 0.004 % SOLN ophthalmic solution, Place 1 drop into both eyes every morning., Disp: , Rfl: 1   Subjective:   PATIENT ID: Brett [name] GENDER: male DOB: April 15, 1956, MRN: [id number]  Chief Complaint  Patient presents with   Lung Mass    SOB. No wheezing. No cough.    HPI  Patient is a 68 year old male presenting to clinic for the evaluation of a pulmonary nodule.   Patient was noted to have an SVT while admitted for lumbar spine fusion procedure. Given the SVT, he was referred to cardiology and underwent a workup. This workup included a cardiac  PET in February of 2025 which revealed a left lower lobe pulmonary nodule.  He is enrolled in the low-dose CT for lung cancer screening and the repeat scan in April of 2025 showed enlargement in the size of this left lower lobe pulmonary nodule. His LDCT one year prior (April 2024) did not report any pulmonary nodules in that area.   Patient presents today in the company of his daughter.  They report stable symptoms that are unchanged from prior. He reports chronic cough that has actually improved with the use of loratadine .  The cough is at times productive of clear sputum. He is also experiencing wheezing that is sporadic and occasional. The daughter is reporting the wheeze  more so than the patient. He denied any symptoms of upper or lower respiratory tract infections around the time of his low-dose CT.  He does not have any chest pain or tightness, and has not had any fevers or chills.  There was no increase in the quantity of the phlegm or changed in its quality or color.  Return Visit 04/17/2024:  He was admitted to the hospital 7/14 and underwent left lower lobectomy. Course was complicated by air leak requiring placement of endobronchial valves, which were then removed. He was discharged on oxygen  therapy and remains on it. He is feeling better since discharge, but still considerably short of breath. He does not have a cough. While hospitalized he was given nebulizers which helped with his breathing.   Patient reports a history of smoking, having smoked between half a pack and 1 full pack a day for the past 53 years.  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    Ancillary information including prior medications, full medical/surgical/family/social histories, and PFTs (when available) are listed below and have been reviewed.   {PULM QUESTIONNAIRES (Optional):33196}  ROS   Objective:   Vitals:   04/17/24 1138  BP: 122/72  Pulse: 77  Temp: (!) 97.5 F (36.4 C)  SpO2: 95%  Weight: 171 lb 9.6 oz (77.8 kg)  Height: 6' 2 (1.88 m)   95% on *** LPM *** RA BMI Readings from Last 3 Encounters:  04/17/24 22.03 kg/m  04/09/24 21.31 kg/m  03/26/24 21.36 kg/m   Wt Readings from Last 3 Encounters:  04/17/24 171 lb 9.6 oz (77.8 kg)  04/09/24 166 lb (75.3 kg)  03/26/24 166 lb 6.4 oz (75.5 kg)    Physical Exam    Ancillary Information    Past Medical History:  Diagnosis Date   Allergy    Arthritis    Asthma    Bilateral low back pain without sciatica 09/22/2015   Blood transfusion without reported diagnosis    Cancer (HCC)    Left Lower Lobe Adenocarcinoma   Cardiomyopathy (HCC)    Cervical neck pain with evidence of disc  disease 09/22/2015   Chicken pox    Congenital hip deformity    Corn of foot 08/09/2022   Degenerative disc disease, cervical 01/29/2015   Dysrhythmia    SVT   Eczema 05/30/2013   Erectile dysfunction 09/22/2015   Glaucoma 05/30/2013   History of cardiac dysrhythmia 05/23/2023   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 08/06/2023   Pain of left  hip joint 11/01/2018   Paroxysmal SVT (supraventricular tachycardia) (HCC), incidental finding IntraOp back surgery requiring esmolol ; event monitor November 2024 for short runs, longest episode 17 beats. 06/19/2023   Event monitor November 2024, supraventricular ectopy burden 1.7%.  4 short runs of SVT, longest episode 17 beats.  No symptoms reported     Personal history of congenital hip dysplasia 05/30/2013   Preventative health care 01/29/2015   Rhinitis, allergic 09/22/2015   Rosacea 05/30/2013   Scoliosis deformity of spine 05/05/2020   Spinal stenosis of lumbar region 06/16/2023   Spondylolisthesis at L5-S1 level 09/14/2020   Tobacco abuse 05/30/2013     Family History  Problem Relation Age of Onset   Cancer Mother 56       history of colon cancer   Rosacea Mother    Colon cancer Mother 26   Colon polyps Mother    Rosacea Father    Lung disease Father        ?pulmonary fibrosis   Alcohol abuse Brother    Depression Brother    Colon cancer Maternal Grandmother    Endocrine tumor Daughter        pituitary tumor, POTTS   Thyroid  disease Son        ?hyperthyroid   Cancer Cousin        colon   Colon cancer Cousin    Esophageal cancer Neg Hx    Rectal cancer Neg Hx    Stomach cancer Neg Hx      Past Surgical History:   Procedure Laterality Date   ANTERIOR LAT LUMBAR FUSION Right 05/09/2023   Procedure: Extreme Lateral Interbody Fusion Lumbar three-four -right;  Surgeon: Louis Shove, MD;  Location: [facility] OR;  Service: Neurosurgery;  Laterality: Right;  3C   BRONCHOSCOPY, WITH BIOPSY USING ELECTROMAGNETIC NAVIGATION Bilateral 01/09/2024   Procedure: ROBOTIC ASSISTED NAVIGATIONAL BRONCHOSCOPY;  Surgeon: Isadora Hose, MD;  Location: [facility] ORS;  Service: Pulmonary;  Laterality: Bilateral;   COLONOSCOPY  06/17/2015   Pyrtle   COLONOSCOPY  07/20/2020   Pyrtle   COLONOSCOPY  02/15/2024   ENDOBRONCHIAL ULTRASOUND Bilateral 01/09/2024   Procedure: ENDOBRONCHIAL ULTRASOUND (EBUS);  Surgeon: Isadora Hose, MD;  Location: [facility] ORS;  Service: Pulmonary;  Laterality: Bilateral;   HAMMER TOE SURGERY  07/28/2011   Procedure: HAMMER TOE CORRECTION;  Surgeon: Toribio JULIANNA Chancy, MD;  Location: [facility];  Service: Orthopedics;  Laterality: Right;  right 2nd and 4th toes correction hammer toe, capsulotomy metatarsal-phalangeal joints   HERNIA REPAIR Bilateral    HIP SURGERY  1986 & 2010   rt total hip-8/10-multiple rt hip surgeries- had 4 prior to 1986 as a child   INTERCOSTAL NERVE BLOCK Left 02/19/2024   Procedure: BLOCK, NERVE, INTERCOSTAL;  Surgeon: Kerrin Elspeth BROCKS, MD;  Location: [facility] OR;  Service: Thoracic;  Laterality: Left;   JOINT REPLACEMENT  2010   Partial Right Hip   LOBECTOMY, LUNG, ROBOT-ASSISTED, USING VATS Left 02/19/2024   Procedure: LOBECTOMY, LUNG, ROBOT-ASSISTED, USING VATS;  Surgeon: Kerrin Elspeth BROCKS, MD;  Location: [facility] OR;  Service: Thoracic;  Laterality: Left;  ROBOTIC LEFT LOWER LOBECTOMY   lumber fusion  09/2020   POLYPECTOMY     SENTINEL NODE BIOPSY Left 02/19/2024   Procedure: BIOPSY, LYMPH NODE;  Surgeon: Kerrin Elspeth BROCKS, MD;  Location: [facility] OR;  Service: Thoracic;  Laterality: Left;   SPINE SURGERY  09/14/2020   THUMB ARTHROSCOPY  2008   rt   TOTAL HIP ARTHROPLASTY Right  1985   VIDEO BRONCHOSCOPY N/A  02/23/2024   Procedure: VIDEO BRONCHOSCOPY WITHOUT FLUORO;  Surgeon: Kerrin Elspeth BROCKS, MD;  Location: [facility] ENDOSCOPY;  Service: Thoracic;  Laterality: N/A;  Bronch with IBV insertion   VIDEO BRONCHOSCOPY WITH INSERTION OF INTERBRONCHIAL VALVE (IBV) N/A 02/23/2024   Procedure: BRONCHOSCOPY, FLEXIBLE, WITH INTRABRONCHIAL VALVE INSERTION;  Surgeon: Kerrin Elspeth BROCKS, MD;  Location: [facility] ENDOSCOPY;  Service: Thoracic;  Laterality: N/A;    Social History   Socioeconomic History   Marital status: Widowed    Spouse name: Not on file   Number of children: Not on file   Years of education: Not on file   Highest education level: Associate degree: occupational, Scientist, product/process development, or vocational program  Occupational History   Not on file  Tobacco Use   Smoking status: Former    Current packs/day: 0.00    Average packs/day: 0.5 packs/day for 50.3 years (25.2 ttl pk-yrs)    Types: Cigarettes    Start date: 10    Quit date: 12/11/2023    Years since quitting: 0.3   Smokeless tobacco: Never   Tobacco comments:    Started smoking at 68 years old.    Smoked 1 PPD at his heaviest.    Quit smoking on 12/11/2023  Vaping Use   Vaping status: Never Used  Substance and Sexual Activity   Alcohol use: Yes    Alcohol/week: 14.0 standard drinks of alcohol    Types: 14 Standard drinks or equivalent per week   Drug use: No   Sexual activity: Not Currently    Birth control/protection: None  Other Topics Concern   Not on file  Social History Narrative   Quality control Tech- gauges/callibrations.  (Retired)   Some college/tech school   wife passed   3 grown children (oldest daughter is living with them) youngest daughter lives in [place].  Curator at [employer].  Son lives near [place]- framing/art.   Social Drivers of Corporate investment banker Strain: Low Risk  (02/06/2024)   Overall Financial Resource Strain (CARDIA)    Difficulty of Paying Living Expenses: Not  hard at all  Recent Concern: Financial Resource Strain - Medium Risk (12/03/2023)   Overall Financial Resource Strain (CARDIA)    Difficulty of Paying Living Expenses: Somewhat hard  Food Insecurity: No Food Insecurity (03/04/2024)   Hunger Vital Sign    Worried About Running Out of Food in the Last Year: Never true    Ran Out of Food in the Last Year: Never true  Transportation Needs: No Transportation Needs (03/04/2024)   PRAPARE - Administrator, Civil Service (Medical): No    Lack of Transportation (Non-Medical): No  Physical Activity: Inactive (02/06/2024)   Exercise Vital Sign    Days of Exercise per Week: 0 days    Minutes of Exercise per Session: Not on file  Stress: No Stress Concern Present (02/06/2024)   [employer] of Occupational Health - Occupational Stress Questionnaire    Feeling of Stress: Only a little  Social Connections: Moderately Integrated (02/19/2024)   Social Connection and Isolation Panel    Frequency of Communication with Friends and Family: More than three times a week    Frequency of Social Gatherings with Friends and Family: Three times a week    Attends Religious Services: 1 to 4 times per year    Active Member of Clubs or Organizations: No    Attends Engineer, structural: More than 4 times per year    Marital Status: Widowed  Recent Concern:  Social Connections - Moderately Isolated (02/06/2024)   Social Connection and Isolation Panel    Frequency of Communication with Friends and Family: More than three times a week    Frequency of Social Gatherings with Friends and Family: Three times a week    Attends Religious Services: 1 to 4 times per year    Active Member of Clubs or Organizations: No    Attends Banker Meetings: Not on file    Marital Status: Widowed  Intimate Partner Violence: Not At Risk (03/04/2024)   Humiliation, Afraid, Rape, and Kick questionnaire    Fear of Current or Ex-Partner: No    Emotionally Abused:  No    Physically Abused: No    Sexually Abused: No     No Known Allergies   CBC    Component Value Date/Time   WBC 9.0 03/14/2024 0752   WBC 8.3 03/13/2024 1424   RBC 4.27 03/14/2024 0752   HGB 13.7 03/14/2024 0752   HGB 15.5 10/11/2023 0932   HCT 41.0 03/14/2024 0752   HCT 44.3 10/11/2023 0932   PLT 308 03/14/2024 0752   MCV 96.0 03/14/2024 0752   MCH 32.1 03/14/2024 0752   MCHC 33.4 03/14/2024 0752   RDW 12.8 03/14/2024 0752   LYMPHSABS 1.7 03/14/2024 0752   MONOABS 1.5 (H) 03/14/2024 0752   EOSABS 0.4 03/14/2024 0752   BASOSABS 0.1 03/14/2024 0752    Pulmonary Functions Testing Results:    Latest Ref Rng & Units 01/17/2024    9:26 AM  PFT Results  FVC-Pre L 4.99   FVC-Predicted Pre % 96   FVC-Post L 5.43   FVC-Predicted Post % 105   Pre FEV1/FVC % % 41   Post FEV1/FCV % % 42   FEV1-Pre L 2.03   FEV1-Predicted Pre % 53   FEV1-Post L 2.28   DLCO uncorrected ml/min/mmHg 12.00   DLCO UNC% % 40   DLCO corrected ml/min/mmHg 11.90   DLCO COR %Predicted % 40   DLVA Predicted % 34   TLC L 10.86   TLC % Predicted % 138   RV % Predicted % 190     Outpatient Medications Prior to Visit  Medication Sig Dispense Refill   amiodarone  (PACERONE ) 200 MG tablet Take 200 mg by mouth two times daily for 7 days; then take 200 mg by mouth daily thereafter 60 tablet 1   apixaban  (ELIQUIS ) 5 MG TABS tablet Take 1 tablet (5 mg total) by mouth 2 (two) times daily. 60 tablet 1   aspirin  EC 81 MG tablet Take 1 tablet (81 mg total) by mouth daily. Swallow whole.     calcium  carbonate (TUMS EX) 750 MG chewable tablet Chew 1-2 tablets by mouth 2 (two) times daily as needed for heartburn.     Clotrimazole  1 % OINT Apply to affected area twice daily as needed 56.7 g 0   empagliflozin  (JARDIANCE ) 10 MG TABS tablet Take 1 tablet (10 mg total) by mouth daily before breakfast. 90 tablet 1   furosemide  (LASIX ) 20 MG tablet Take 1 tablet (20 mg total) by mouth daily. 90 tablet 3   metoprolol   succinate (TOPROL  XL) 25 MG 24 hr tablet Take 1 tablet (25 mg total) by mouth daily. 90 tablet 3   Multiple Vitamin (MULTIVITAMIN WITH MINERALS) TABS tablet Take 1 tablet by mouth in the morning.     Oxycodone  HCl 10 MG TABS Take 1 tablet by mouth every 6 (six) hours as needed.     OXYGEN   Inhale 2 L/min into the lungs continuous. (Patient taking differently: Inhale 4 L/min into the lungs continuous.)     STELARA 45 MG/0.5ML injection Inject 45 mg into the skin as directed. Every 12 weeks     Timolol  Maleate, Once-Daily, 0.5 % SOLN Place 1 drop into both eyes in the morning and at bedtime.     TRAVATAN Z 0.004 % SOLN ophthalmic solution Place 1 drop into both eyes every morning.  1   doxycycline  (VIBRA -TABS) 100 MG tablet Take 1 tablet (100 mg total) by mouth 2 (two) times daily. 14 tablet 0   methocarbamol  (ROBAXIN ) 500 MG tablet Take 1 tablet (500 mg total) by mouth every 8 (eight) hours as needed for muscle spasms. 21 tablet 0   varenicline  (CHANTIX  CONTINUING MONTH PAK) 1 MG tablet Take 1 tablet (1 mg total) by mouth 2 (two) times daily. 60 tablet 1   No facility-administered medications prior to visit.

## 2024-04-18 ENCOUNTER — Telehealth: Payer: Self-pay

## 2024-04-18 NOTE — Telephone Encounter (Signed)
 Brett [name], at [place] advised. NFN.

## 2024-04-18 NOTE — Telephone Encounter (Signed)
 SABRA

## 2024-04-18 NOTE — Telephone Encounter (Signed)
 Copied from CRM #[id number]. Topic: Clinical - Medication Question >> Apr 18, 2024 11:07 AM Russell PARAS wrote: Reason for CRM:   Cherise, with [facility], is contacting clinic regarding a medication question. She is currently seeing the pt for PT. She advised he is currently taking Wixela, which is indicated to have an interaction with another medication he is taking called Timolol  Maleate(eyedrops).  Requesting advise on how to move forward, and if medication may need to be changed.  CB#  [phone]

## 2024-04-19 ENCOUNTER — Encounter (HOSPITAL_COMMUNITY): Payer: Self-pay

## 2024-04-19 ENCOUNTER — Telehealth (HOSPITAL_COMMUNITY): Payer: Self-pay

## 2024-04-19 DIAGNOSIS — Z6821 Body mass index (BMI) 21.0-21.9, adult: Secondary | ICD-10-CM | POA: Diagnosis not present

## 2024-04-19 DIAGNOSIS — M5136 Other intervertebral disc degeneration, lumbar region with discogenic back pain only: Secondary | ICD-10-CM | POA: Diagnosis not present

## 2024-04-19 DIAGNOSIS — Z79899 Other long term (current) drug therapy: Secondary | ICD-10-CM | POA: Diagnosis not present

## 2024-04-19 NOTE — Telephone Encounter (Signed)
 Called patient to see if he was interested in participating in the Pulmonary Rehab Program. Patient will come in for orientation on 9/19@1030  and will attend the 10:15 exercise class.   Sent package.

## 2024-04-19 NOTE — Telephone Encounter (Signed)
 Pt insurance is active and benefits verified through [facility] Co-pay $15, DED 0/0 met, out of pocket $4,150/$4,150 met, co-insurance 0%. no pre-authorization required, [place]/[employer] 04/19/2024@1 :30, REF# [id number]

## 2024-04-22 ENCOUNTER — Other Ambulatory Visit: Payer: Self-pay | Admitting: Thoracic Surgery (Cardiothoracic Vascular Surgery)

## 2024-04-22 DIAGNOSIS — R911 Solitary pulmonary nodule: Secondary | ICD-10-CM

## 2024-04-22 NOTE — Progress Notes (Signed)
 cxr

## 2024-04-23 ENCOUNTER — Ambulatory Visit
Payer: Self-pay | Attending: Thoracic Surgery (Cardiothoracic Vascular Surgery) | Admitting: Thoracic Surgery (Cardiothoracic Vascular Surgery)

## 2024-04-23 ENCOUNTER — Ambulatory Visit (HOSPITAL_COMMUNITY)
Admission: RE | Admit: 2024-04-23 | Discharge: 2024-04-23 | Disposition: A | Discharge status: Home / Self Care | Source: Ambulatory Visit | Attending: Cardiovascular Disease | Admitting: Cardiovascular Disease

## 2024-04-23 ENCOUNTER — Encounter: Payer: Self-pay | Admitting: Thoracic Surgery (Cardiothoracic Vascular Surgery)

## 2024-04-23 VITALS — BP 128/63 | HR 61 | Resp 20 | Ht 74.0 in | Wt 169.0 lb

## 2024-04-23 DIAGNOSIS — R911 Solitary pulmonary nodule: Secondary | ICD-10-CM | POA: Insufficient documentation

## 2024-04-23 DIAGNOSIS — Z09 Encounter for follow-up examination after completed treatment for conditions other than malignant neoplasm: Secondary | ICD-10-CM

## 2024-04-23 DIAGNOSIS — Z48813 Encounter for surgical aftercare following surgery on the respiratory system: Secondary | ICD-10-CM | POA: Diagnosis not present

## 2024-04-23 DIAGNOSIS — C3432 Malignant neoplasm of lower lobe, left bronchus or lung: Secondary | ICD-10-CM

## 2024-04-23 DIAGNOSIS — J984 Other disorders of lung: Secondary | ICD-10-CM | POA: Diagnosis not present

## 2024-04-23 NOTE — Progress Notes (Signed)
 [address], [place] [address]             [phone]     Brett [name] is a 68 year old man with a complicated medical history including long-term tobacco abuse, COPD, lung cancer, paroxysmal SVT, nonsustained ventricular tachycardia, scoliosis, multiple hip and back surgeries, chronic narcotic dependent pain, cervical disc disease, glaucoma, and rosacea.   He had a cardiac stress PET scan and was found to have a new left lower lobe lung nodule.  On follow-up the nodule is increased in size.  Repeat scan later showed it increased in size to 16 x 9 mm and was hypermetabolic with SUV of 6.2.   He underwent robotic assisted left lower lobectomy on 02/19/2024.  Complicated initially by a large air leak.  I placed IBV on 02/23/2024 but he developed refractory hypoxia and complete atelectasis of the left lung.  We put him right back to sleep and removed the IBVs.  Interestingly his airleak stopped and we were able to get his tube out a few days later.  He did develop some left lower lobe atelectasis.  Treated with antibiotics.  Discharged home on oxygen .  He continues to have dyspnea and requiring oxygen .  He actually can go through routine activities without much difficulty.  However he has noted a couple episodes where he had sensation where he could not get his breath and discomfort in his arms.  Lasted about 5 minutes and resolved spontaneously.  His incisional pain has improved to some degree, but has not resolved.  Has been using Tylenol  and ibuprofen  intermittently.  Is on oxycodone  chronically for other pain issues.  In the interim since his last visit he had a PET to evaluate for cardiac sarcoidosis.  Was noted to have 1/8 rib fracture.  Still had significant consolidation in lower portion of his left upper lobe.  Past Medical History:  Diagnosis Date   Allergy    Arthritis    Asthma    Bilateral low back pain without sciatica 09/22/2015   Blood  transfusion without reported diagnosis    Cancer (HCC)    Left Lower Lobe Adenocarcinoma   Cardiomyopathy (HCC)    Cervical neck pain with evidence of disc disease 09/22/2015   Chicken pox    Congenital hip deformity    Corn of foot 08/09/2022   Degenerative disc disease, cervical 01/29/2015   Dysrhythmia    SVT   Eczema 05/30/2013   Erectile dysfunction 09/22/2015   Glaucoma 05/30/2013   History of cardiac dysrhythmia 05/23/2023   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 08/06/2023   Pain of left hip joint 11/01/2018   Paroxysmal SVT (supraventricular tachycardia) (HCC), incidental finding IntraOp back surgery requiring esmolol ; event monitor November 2024 for short runs, longest episode 17 beats. 06/19/2023   Event monitor November 2024, supraventricular ectopy burden 1.7%.  4 short runs of SVT, longest episode 17 beats.  No symptoms reported     Personal history of congenital hip dysplasia 05/30/2013   Preventative health care 01/29/2015   Rhinitis, allergic 09/22/2015   Rosacea 05/30/2013   Scoliosis deformity of spine 05/05/2020   Spinal stenosis of lumbar region 06/16/2023   Spondylolisthesis at  L5-S1 level 09/14/2020   Tobacco abuse 05/30/2013    Current Outpatient Medications  Medication Sig Dispense Refill   amiodarone  (PACERONE ) 200 MG tablet Take 200 mg by mouth two times daily for 7 days; then take 200 mg by mouth daily thereafter 60 tablet 1   apixaban  (ELIQUIS ) 5 MG TABS tablet Take 1 tablet (5 mg total) by mouth 2 (two) times daily. 60 tablet 1   aspirin  EC 81 MG tablet Take 1 tablet (81 mg total) by mouth daily. Swallow whole.     calcium  carbonate (TUMS EX)  750 MG chewable tablet Chew 1-2 tablets by mouth 2 (two) times daily as needed for heartburn.     Clotrimazole  1 % OINT Apply to affected area twice daily as needed 56.7 g 0   empagliflozin  (JARDIANCE ) 10 MG TABS tablet Take 1 tablet (10 mg total) by mouth daily before breakfast. 90 tablet 1   fluticasone -salmeterol (WIXELA INHUB) 100-50 MCG/ACT AEPB Inhale 1 puff into the lungs 2 (two) times daily. 60 each 11   furosemide  (LASIX ) 20 MG tablet Take 1 tablet (20 mg total) by mouth daily. 90 tablet 3   metoprolol  succinate (TOPROL  XL) 25 MG 24 hr tablet Take 1 tablet (25 mg total) by mouth daily. 90 tablet 3   Multiple Vitamin (MULTIVITAMIN WITH MINERALS) TABS tablet Take 1 tablet by mouth in the morning.     Oxycodone  HCl 10 MG TABS Take 1 tablet by mouth every 6 (six) hours as needed.     OXYGEN  Inhale 2 L/min into the lungs continuous. (Patient taking differently: Inhale 4 L/min into the lungs continuous.)     STELARA 45 MG/0.5ML injection Inject 45 mg into the skin as directed. Every 12 weeks     Timolol  Maleate, Once-Daily, 0.5 % SOLN Place 1 drop into both eyes in the morning and at bedtime.     TRAVATAN Z 0.004 % SOLN ophthalmic solution Place 1 drop into both eyes every morning.  1   No current facility-administered medications for this visit.    Physical Exam BP 128/63   Pulse 61   Resp 20   Ht 6' 2 (1.88 m)   Wt 169 lb (76.7 kg)   SpO2 92% Comment: 4L O2 per [place]  BMI 21.36 kg/m  68 year old man in no acute distress Alert and oriented x 3 with no focal deficits Lungs with diminished breath sounds at left base, no rales or wheezing Incisions healed Cardiac regular rate and rhythm  Diagnostic Tests: CHEST - 2 VIEW   COMPARISON:  03/12/2024   FINDINGS: Volume loss in left hemithorax is stable. Mildly improved airspace disease noted left lung base. Right lung is hyperexpanded but clear. Interstitial markings are diffusely coarsened with chronic features. No acute bony  abnormality.   IMPRESSION: Mildly improved airspace disease at the left lung base.     Electronically Signed   By: Camellia Candle M.D.   On: 04/23/2024 11:36 I personally reviewed the chest x-ray images.  Slight improvement but persistence of airspace disease at inferior aspect of the left upper lobe.  Reviewed images from his PET to evaluate for cardiac sarcoid.  Showed some persistent metabolic activity in the same area.  Also a nondisplaced eighth rib fracture.  Impression: Brett [name] is a 68 year old man with a complicated medical history including long-term tobacco abuse, COPD, lung cancer, paroxysmal SVT, nonsustained ventricular tachycardia, scoliosis, multiple hip and back surgeries, chronic narcotic dependent pain, cervical disc disease, glaucoma, and rosacea.  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  Complicated by prolonged airleak.  Atelectasis and hypoxia after placement of IPV requiring immediate removal.  Persistent consolidation basilar portion of left upper lobe.  He saw Dr. Sherrod.  No need for adjuvant therapy.  Saw Dr. Isadora recently and was started on a new inhaler.  Does have a steroid component so hopefully that will help.  Describes sensation shortness of breath with associated with some discomfort in his arms.  Concerning for possible cardiac source.  He does have pretty extensive coronary calcification on CT.  Has a follow-up appointment with cardiology next week.  I did advise him that if that pain were to occur and not resolve spontaneously within 5 minutes he should call EMS.  Plan: Follow-up with cardiology as scheduled Return in 1 month with PA lateral chest x-ray to check on progress  Elspeth JAYSON Millers, MD [facility] [phone]

## 2024-04-25 ENCOUNTER — Telehealth (HOSPITAL_COMMUNITY): Payer: Self-pay

## 2024-04-25 NOTE — Telephone Encounter (Signed)
 LVM for pt confirming appt with [facility] tomorrow

## 2024-04-26 ENCOUNTER — Encounter (HOSPITAL_COMMUNITY)
Admission: RE | Admit: 2024-04-26 | Discharge: 2024-04-26 | Disposition: A | Discharge status: Home / Self Care | Source: Ambulatory Visit | Attending: Student in an Organized Health Care Education/Training Program | Admitting: Student in an Organized Health Care Education/Training Program

## 2024-04-26 ENCOUNTER — Encounter (HOSPITAL_COMMUNITY): Payer: Self-pay

## 2024-04-26 VITALS — BP 118/60 | HR 74 | Wt 169.5 lb

## 2024-04-26 DIAGNOSIS — I5042 Chronic combined systolic (congestive) and diastolic (congestive) heart failure: Secondary | ICD-10-CM | POA: Insufficient documentation

## 2024-04-26 DIAGNOSIS — C3432 Malignant neoplasm of lower lobe, left bronchus or lung: Secondary | ICD-10-CM | POA: Insufficient documentation

## 2024-04-26 NOTE — Progress Notes (Signed)
 Pulmonary Individual Treatment Plan  Patient Details  Name: Brett [name] MRN: [id number] Date of Birth: Dec 08, 1955 Referring Provider:   [name] Pulmonary Rehab Walk Test from 04/26/2024 in [facility]  Referring Provider [name]    Initial Encounter Date:  Flowsheet Row Pulmonary Rehab Walk Test from 04/26/2024 in [facility]  Date 04/26/24    Visit Diagnosis: Malignant neoplasm of lower lobe bronchus, left (HCC)  Chronic combined systolic and diastolic heart failure (HCC)  Patient's Home Medications on Admission:   Current Outpatient Medications:    amiodarone  (PACERONE ) 200 MG tablet, Take 200 mg by mouth two times daily for 7 days; then take 200 mg by mouth daily thereafter, Disp: 60 tablet, Rfl: 1   apixaban  (ELIQUIS ) 5 MG TABS tablet, Take 1 tablet (5 mg total) by mouth 2 (two) times daily., Disp: 60 tablet, Rfl: 1   aspirin  EC 81 MG tablet, Take 1 tablet (81 mg total) by mouth daily. Swallow whole., Disp: , Rfl:    calcium  carbonate (TUMS EX) 750 MG chewable tablet, Chew 1-2 tablets by mouth 2 (two) times daily as needed for heartburn., Disp: , Rfl:    Clotrimazole  1 % OINT, Apply to affected area twice daily as needed, Disp: 56.7 g, Rfl: 0   empagliflozin  (JARDIANCE ) 10 MG TABS tablet, Take 1 tablet (10 mg total) by mouth daily before breakfast., Disp: 90 tablet, Rfl: 1   fluticasone -salmeterol (WIXELA INHUB) 100-50 MCG/ACT AEPB, Inhale 1 puff into the lungs 2 (two) times daily., Disp: 60 each, Rfl: 11   furosemide  (LASIX ) 20 MG tablet, Take 1 tablet (20 mg total) by mouth daily., Disp: 90 tablet, Rfl: 3   metoprolol  succinate (TOPROL  XL) 25 MG 24 hr tablet, Take 1 tablet (25 mg total) by mouth daily., Disp: 90 tablet, Rfl: 3   Multiple Vitamin (MULTIVITAMIN WITH MINERALS) TABS tablet, Take 1 tablet by mouth in the morning., Disp: , Rfl:    Oxycodone   HCl 10 MG TABS, Take 1 tablet by mouth every 6 (six) hours as needed., Disp: , Rfl:    OXYGEN , Inhale 2 L/min into the lungs continuous., Disp: , Rfl:    STELARA 45 MG/0.5ML injection, Inject 45 mg into the skin as directed. Every 12 weeks, Disp: , Rfl:    Timolol  Maleate, Once-Daily, 0.5 % SOLN, Place 1 drop into both eyes in the morning and at bedtime., Disp: , Rfl:    TRAVATAN Z 0.004 % SOLN ophthalmic solution, Place 1 drop into both eyes every morning., Disp: , Rfl: 1  Past Medical History: Past Medical History:  Diagnosis Date   Allergy    Arthritis    Asthma    Bilateral low back pain without sciatica 09/22/2015   Blood transfusion without reported diagnosis    Cancer (HCC)    Left Lower Lobe Adenocarcinoma   Cardiomyopathy (HCC)    Cervical neck pain with evidence of disc disease 09/22/2015   Chicken pox    Congenital hip deformity    Corn of foot 08/09/2022   Degenerative disc disease, cervical 01/29/2015   Dysrhythmia    SVT   Eczema 05/30/2013   Erectile dysfunction 09/22/2015   Glaucoma 05/30/2013   History of cardiac dysrhythmia 05/23/2023   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 08/06/2023   Pain of left hip joint 11/01/2018   Paroxysmal SVT (supraventricular tachycardia) (HCC), incidental finding IntraOp back surgery requiring esmolol ; event monitor November 2024 for short runs, longest episode 17 beats. 06/19/2023   Event monitor November 2024, supraventricular ectopy burden 1.7%.  4 short runs of SVT, longest episode 17 beats.  No symptoms reported     Personal history of congenital hip dysplasia 05/30/2013   Preventative health care 01/29/2015   Rhinitis,  allergic 09/22/2015   Rosacea 05/30/2013   Scoliosis deformity of spine 05/05/2020   Spinal stenosis of lumbar region 06/16/2023   Spondylolisthesis at L5-S1 level 09/14/2020   Tobacco abuse 05/30/2013    Tobacco Use: Social History   Tobacco Use  Smoking Status Former   Current packs/day: 0.00   Average packs/day: 0.5 packs/day for 50.3 years (25.2 ttl pk-yrs)   Types: Cigarettes   Start date: 24   Quit date: 12/11/2023   Years since quitting: 0.3  Smokeless Tobacco Never  Tobacco Comments   Started smoking at 68 years old.   Smoked 1 PPD at his heaviest.   Quit smoking on 12/11/2023    Labs: Review Flowsheet  More data exists      Latest Ref Rng & Units 03/04/2016 03/24/2017 04/13/2018 04/19/2019 04/24/2020  Labs for ITP Cardiac and Pulmonary Rehab  Cholestrol <200 mg/dL 827  839  821  825  821   LDL (calc) mg/dL (calc) 83  83  77  72  83   HDL-C > OR = 40 mg/dL 22.39  37.49  08.59  07.69  78   Trlycerides <150 mg/dL 40.9  25.9  48.9  50.9  79     Capillary Blood Glucose: Lab Results  Component Value Date   GLUCAP 95 12/22/2023   GLUCAP 93 09/22/2022   GLUCAP 127 (H) 04/09/2008   GLUCAP 116 (H) 04/09/2008     Pulmonary Assessment Scores:  Pulmonary Assessment Scores     Row Name 04/26/24 1052         ADL UCSD   ADL Phase Entry     SOB Score total 88       CAT Score   CAT Score 32       mMRC Score   mMRC Score 3       UCSD: Self-administered rating of dyspnea associated with activities of daily living (ADLs) 6-point scale (0 = not at all to 5 = maximal or unable to do because of breathlessness)  Scoring Scores range from 0 to 120.  Minimally important difference is 5 units  CAT: CAT can identify the health impairment of COPD patients and is better correlated with disease progression.  CAT has a scoring range of zero to 40. The CAT score is classified into four groups of low (less than 10), medium (10 - 20), high (21-30) and very high (31-40) based  on the impact level of disease on health status. A CAT score over 10 suggests significant symptoms.  A worsening CAT score could be explained by an exacerbation, poor medication adherence, poor inhaler technique, or progression of COPD or comorbid conditions.  CAT MCID is 2 points  mMRC: mMRC (Modified Medical Research Council) Dyspnea Scale is used to assess the degree of baseline functional disability in patients of respiratory disease due to dyspnea. No minimal important difference is established. A decrease in score  of 1 point or greater is considered a positive change.   Pulmonary Function Assessment:  Pulmonary Function Assessment - 04/26/24 1105       Breath   Bilateral Breath Sounds Decreased;Clear    Shortness of Breath Yes;Limiting activity;Fear of Shortness of Breath          Exercise Target Goals: Exercise Program Goal: Individual exercise prescription set using results from initial 6 min walk test and THRR while considering  patient's activity barriers and safety.   Exercise Prescription Goal: Initial exercise prescription builds to 30-45 minutes a day of aerobic activity, 2-3 days per week.  Home exercise guidelines will be given to patient during program as part of exercise prescription that the participant will acknowledge.  Activity Barriers & Risk Stratification:  Activity Barriers & Cardiac Risk Stratification - 04/26/24 1104       Activity Barriers & Cardiac Risk Stratification   Activity Barriers Deconditioning;Muscular Weakness;Shortness of Breath;Right Hip Replacement;Back Problems    Cardiac Risk Stratification Moderate          6 Minute Walk:  6 Minute Walk     Row Name 04/26/24 1211         6 Minute Walk   Phase Initial     Distance 661 feet     Walk Time 6 minutes     # of Rest Breaks 1  3:55-4:12     MPH 1.25     METS 2.07     RPE 12     Perceived Dyspnea  1     VO2 Peak 7.24     Symptoms Yes (comment)     Comments hip and back pain      Resting HR 74 bpm     Resting BP 118/60     Resting Oxygen  Saturation  90 %     Exercise Oxygen  Saturation  during 6 min walk 86 %     Max Ex. HR 76 bpm     Max Ex. BP 122/64     2 Minute Post BP 112/60       Interval HR   1 Minute HR 72     2 Minute HR 71     3 Minute HR 75     4 Minute HR 71     5 Minute HR 76     6 Minute HR 76     2 Minute Post HR 60     Interval Heart Rate? Yes       Interval Oxygen    Interval Oxygen ? Yes     Baseline Oxygen  Saturation % 90 %     1 Minute Oxygen  Saturation % 90 %     1 Minute Liters of Oxygen  4 L     2 Minute Oxygen  Saturation % 86 %  O2 increased to 6L     2 Minute Liters of Oxygen  4 L  increased to 6L     3 Minute Oxygen  Saturation % 90 %     3 Minute Liters of Oxygen  6 L     4 Minute Oxygen  Saturation % 91 %     4 Minute Liters of Oxygen  6 L     5 Minute Oxygen  Saturation % 90 %     5 Minute Liters of Oxygen  6 L     6 Minute Oxygen  Saturation % 94 %     6 Minute Liters of Oxygen  6 L     2 Minute Post Oxygen  Saturation % 97 %  2 Minute Post Liters of Oxygen  6 L        Oxygen  Initial Assessment:  Oxygen  Initial Assessment - 04/26/24 1048       Home Oxygen    Home Oxygen  Device Home Concentrator;E-Tanks    Sleep Oxygen  Prescription Continuous    Liters per minute 4    Home Exercise Oxygen  Prescription Continuous    Liters per minute 4    Home Resting Oxygen  Prescription Continuous    Liters per minute 4      Initial 6 min Walk   Oxygen  Used Continuous    Liters per minute 6      Program Oxygen  Prescription   Program Oxygen  Prescription Continuous    Liters per minute 6      Intervention   Short Term Goals To learn and exhibit compliance with exercise, home and travel O2 prescription;To learn and understand importance of monitoring SPO2 with pulse oximeter and demonstrate accurate use of the pulse oximeter.;To learn and understand importance of maintaining oxygen  saturations>88%;To learn and demonstrate proper  pursed lip breathing techniques or other breathing techniques. ;To learn and demonstrate proper use of respiratory medications    Long  Term Goals Exhibits compliance with exercise, home  and travel O2 prescription;Maintenance of O2 saturations>88%;Compliance with respiratory medication;Verbalizes importance of monitoring SPO2 with pulse oximeter and return demonstration;Exhibits proper breathing techniques, such as pursed lip breathing or other method taught during program session;Demonstrates proper use of MDI's          Oxygen  Re-Evaluation:   Oxygen  Discharge (Final Oxygen  Re-Evaluation):   Initial Exercise Prescription:  Initial Exercise Prescription - 04/26/24 1200       Date of Initial Exercise RX and Referring Provider   Date 04/26/24    Referring Provider [name]    Expected Discharge Date 07/23/24      Oxygen    Oxygen  Continuous    Liters 6    Maintain Oxygen  Saturation 88% or higher      NuStep   Level 1    SPM 54    Minutes 30    METs 1.4      Prescription Details   Frequency (times per week) 2    Duration Progress to 30 minutes of continuous aerobic without signs/symptoms of physical distress      Intensity   THRR 40-80% of Max Heartrate 60-121    Ratings of Perceived Exertion 11-13    Perceived Dyspnea 0-4      Progression   Progression Continue to progress workloads to maintain intensity without signs/symptoms of physical distress.      Resistance Training   Training Prescription Yes    Weight red bands    Reps 10-15          Perform Capillary Blood Glucose checks as needed.  Exercise Prescription Changes:   Exercise Comments:   Exercise Goals and Review:   Exercise Goals     Row Name 04/26/24 1048             Exercise Goals   Increase Physical Activity Yes       Intervention Provide advice, education, support and counseling about physical activity/exercise needs.;Develop an individualized exercise prescription for aerobic and  resistive training based on initial evaluation findings, risk stratification, comorbidities and participant's personal goals.       Expected Outcomes Short Term: Attend rehab on a regular basis to increase amount of physical activity.;Long Term: Add in home exercise to make exercise part of routine and to increase amount of  physical activity.;Long Term: Exercising regularly at least 3-5 days a week.       Increase Strength and Stamina Yes       Intervention Provide advice, education, support and counseling about physical activity/exercise needs.;Develop an individualized exercise prescription for aerobic and resistive training based on initial evaluation findings, risk stratification, comorbidities and participant's personal goals.       Expected Outcomes Short Term: Increase workloads from initial exercise prescription for resistance, speed, and METs.;Short Term: Perform resistance training exercises routinely during rehab and add in resistance training at home;Long Term: Improve cardiorespiratory fitness, muscular endurance and strength as measured by increased METs and functional capacity ( )       Able to understand and use rate of perceived exertion (RPE) scale Yes       Intervention Provide education and explanation on how to use RPE scale       Expected Outcomes Short Term: Able to use RPE daily in rehab to express subjective intensity level;Long Term:  Able to use RPE to guide intensity level when exercising independently       Able to understand and use Dyspnea scale Yes       Intervention Provide education and explanation on how to use Dyspnea scale       Expected Outcomes Short Term: Able to use Dyspnea scale daily in rehab to express subjective sense of shortness of breath during exertion;Long Term: Able to use Dyspnea scale to guide intensity level when exercising independently       Knowledge and understanding of Target Heart Rate Range (THRR) Yes       Intervention Provide education and  explanation of THRR including how the numbers were predicted and where they are located for reference       Expected Outcomes Short Term: Able to state/look up THRR;Long Term: Able to use THRR to govern intensity when exercising independently;Short Term: Able to use daily as guideline for intensity in rehab       Understanding of Exercise Prescription Yes       Intervention Provide education, explanation, and written materials on patient's individual exercise prescription       Expected Outcomes Short Term: Able to explain program exercise prescription;Long Term: Able to explain home exercise prescription to exercise independently          Exercise Goals Re-Evaluation :   Discharge Exercise Prescription (Final Exercise Prescription Changes):   Nutrition:  Target Goals: Understanding of nutrition guidelines, daily intake of sodium 1500mg , cholesterol 200mg , calories 30% from fat and 7% or less from saturated fats, daily to have 5 or more servings of fruits and vegetables.  Biometrics:    Nutrition Therapy Plan and Nutrition Goals:   Nutrition Assessments:  MEDIFICTS Score Key: >=70 Need to make dietary changes  40-70 Heart Healthy Diet <= 40 Therapeutic Level Cholesterol Diet   Picture Your Plate Scores: <59 Unhealthy dietary pattern with much room for improvement. 41-50 Dietary pattern unlikely to meet recommendations for good health and room for improvement. 51-60 More healthful dietary pattern, with some room for improvement.  >60 Healthy dietary pattern, although there may be some specific behaviors that could be improved.    Nutrition Goals Re-Evaluation:   Nutrition Goals Discharge (Final Nutrition Goals Re-Evaluation):   Psychosocial: Target Goals: Acknowledge presence or absence of significant depression and/or stress, maximize coping skills, provide positive support system. Participant is able to verbalize types and ability to use techniques and skills needed  for reducing stress and depression.  Initial  Review & Psychosocial Screening:  Initial Psych Review & Screening - 04/26/24 1059       Initial Review   Current issues with None Identified      Family Dynamics   Good Support System? Yes      Barriers   Psychosocial barriers to participate in program There are no identifiable barriers or psychosocial needs.      Screening Interventions   Interventions Encouraged to exercise          Quality of Life Scores:  Scores of 19 and below usually indicate a poorer quality of life in these areas.  A difference of  2-3 points is a clinically meaningful difference.  A difference of 2-3 points in the total score of the Quality of Life Index has been associated with significant improvement in overall quality of life, self-image, physical symptoms, and general health in studies assessing change in quality of life.  PHQ-9: Review Flowsheet  More data exists      04/26/2024 01/16/2024 02/16/2023 11/01/2021 04/26/2021  Depression screen PHQ 2/9  Decreased Interest 0 0 1 0 0  Down, Depressed, Hopeless 0 0 0 0 0  PHQ - 2 Score 0 0 1 0 0  Altered sleeping 0 - - - -  Tired, decreased energy 0 - - - -  Change in appetite 0 - - - -  Feeling bad or failure about yourself  0 - - - -  Trouble concentrating 0 - - - -  Moving slowly or fidgety/restless 0 - - - -  Suicidal thoughts 0 - - - -  PHQ-9 Score 0 - - - -  Difficult doing work/chores Not difficult at all - - - -   Interpretation of Total Score  Total Score Depression Severity:  1-4 = Minimal depression, 5-9 = Mild depression, 10-14 = Moderate depression, 15-19 = Moderately severe depression, 20-27 = Severe depression   Psychosocial Evaluation and Intervention:  Psychosocial Evaluation - 04/26/24 1155       Psychosocial Evaluation & Interventions   Interventions Encouraged to exercise with the program and follow exercise prescription    Comments Brett [name] denies any psychosocial barriers or  concerns at this time    Expected Outcomes For Brett [name] to participate in PR free of any psychosocial barriers or concerns    Continue Psychosocial Services  No Follow up required          Psychosocial Re-Evaluation:   Psychosocial Discharge (Final Psychosocial Re-Evaluation):   Education: Education Goals: Education classes will be provided on a weekly basis, covering required topics. Participant will state understanding/return demonstration of topics presented.  Learning Barriers/Preferences:  Learning Barriers/Preferences - 04/26/24 1101       Learning Barriers/Preferences   Learning Barriers Sight    Learning Preferences None          Education Topics: Know Your Numbers Group instruction that is supported by a PowerPoint presentation. Instructor discusses importance of knowing and understanding resting, exercise, and post-exercise oxygen  saturation, heart rate, and blood pressure. Oxygen  saturation, heart rate, blood pressure, rating of perceived exertion, and dyspnea are reviewed along with a normal range for these values.    Exercise for the Pulmonary Patient Group instruction that is supported by a PowerPoint presentation. Instructor discusses benefits of exercise, core components of exercise, frequency, duration, and intensity of an exercise routine, importance of utilizing pulse oximetry during exercise, safety while exercising, and options of places to exercise outside of rehab.    MET Level  Group instruction  provided by PowerPoint, verbal discussion, and written material to support subject matter. Instructor reviews what METs are and how to increase METs.    Pulmonary Medications Verbally interactive group education provided by instructor with focus on inhaled medications and proper administration.   Anatomy and Physiology of the Respiratory System Group instruction provided by PowerPoint, verbal discussion, and written material to support subject matter.  Instructor reviews respiratory cycle and anatomical components of the respiratory system and their functions. Instructor also reviews differences in obstructive and restrictive respiratory diseases with examples of each.    Oxygen  Safety Group instruction provided by PowerPoint, verbal discussion, and written material to support subject matter. There is an overview of "What is Oxygen " and "Why do we need it".  Instructor also reviews how to create a safe environment for oxygen  use, the importance of using oxygen  as prescribed, and the risks of noncompliance. There is a brief discussion on traveling with oxygen  and resources the patient may utilize.   Oxygen  Use Group instruction provided by PowerPoint, verbal discussion, and written material to discuss how supplemental oxygen  is prescribed and different types of oxygen  supply systems. Resources for more information are provided.    Breathing Techniques Group instruction that is supported by demonstration and informational handouts. Instructor discusses the benefits of pursed lip and diaphragmatic breathing and detailed demonstration on how to perform both.     Risk Factor Reduction Group instruction that is supported by a PowerPoint presentation. Instructor discusses the definition of a risk factor, different risk factors for pulmonary disease, and how the heart and lungs work together.   Pulmonary Diseases Group instruction provided by PowerPoint, verbal discussion, and written material to support subject matter. Instructor gives an overview of the different type of pulmonary diseases. There is also a discussion on risk factors and symptoms as well as ways to manage the diseases.   Stress and Energy Conservation Group instruction provided by PowerPoint, verbal discussion, and written material to support subject matter. Instructor gives an overview of stress and the impact it can have on the body. Instructor also reviews ways to reduce  stress. There is also a discussion on energy conservation and ways to conserve energy throughout the day.   Warning Signs and Symptoms Group instruction provided by PowerPoint, verbal discussion, and written material to support subject matter. Instructor reviews warning signs and symptoms of stroke, heart attack, cold and flu. Instructor also reviews ways to prevent the spread of infection.   Other Education Group or individual verbal, written, or video instructions that support the educational goals of the pulmonary rehab program.    Knowledge Questionnaire Score:  Knowledge Questionnaire Score - 04/26/24 1050       Knowledge Questionnaire Score   Pre Score 15/18          Core Components/Risk Factors/Patient Goals at Admission:  Personal Goals and Risk Factors at Admission - 04/26/24 1102       Core Components/Risk Factors/Patient Goals on Admission   Tobacco Cessation Yes    Number of packs per day vapes every now and then    Intervention Assist the participant in steps to quit. Provide individualized education and counseling about committing to Tobacco Cessation, relapse prevention, and pharmacological support that can be provided by physician.;Education officer, environmental, assist with locating and accessing local/national Quit Smoking programs, and support quit date choice.    Expected Outcomes Short Term: Will demonstrate readiness to quit, by selecting a quit date.;Short Term: Will quit all tobacco product use, adhering to  prevention of relapse plan.;Long Term: Complete abstinence from all tobacco products for at least 12 months from quit date.    Improve shortness of breath with ADL's Yes    Intervention Provide education, individualized exercise plan and daily activity instruction to help decrease symptoms of SOB with activities of daily living.    Expected Outcomes Short Term: Improve cardiorespiratory fitness to achieve a reduction of symptoms when performing ADLs;Long  Term: Be able to perform more ADLs without symptoms or delay the onset of symptoms          Core Components/Risk Factors/Patient Goals Review:    Core Components/Risk Factors/Patient Goals at Discharge (Final Review):    ITP Comments:   Comments: Dr. Slater Staff is Medical Director for Pulmonary Rehab at [facility].

## 2024-04-26 NOTE — Progress Notes (Signed)
 Brett [name] 68 y.o. male Pulmonary Rehab Orientation Note This patient who was referred to Pulmonary Rehab by Dr. Isadora with the diagnosis of Lung cancer and CHF arrived today in Cardiac and Pulmonary Rehab. He arrived ambulatory with assistive device with limp gait. He does carry portable oxygen . [employer] is the provider for their DME. Per patient, Brett [name] uses oxygen  continuously. Color good, skin warm and dry. Patient is oriented to time and place. Patient's medical history, psychosocial health, and medications reviewed. Psychosocial assessment reveals patient lives with family. Brett [name] is currently retired. Patient hobbies include watching tv and reading. Patient reports his stress level is low. Areas of stress/anxiety include health. Patient does not exhibit signs of depression.  PHQ2/9 score 0/0. Alger shows good  coping skills with positive outlook on life. Offered emotional support and reassurance. Will continue to monitor. Physical assessment performed by Nurse pick: Brett Levin RN. Please see their orientation physical assessment note. Brett [name] reports he  does take medications as prescribed. Patient states he  follows a regular  diet. The patient reports no specific efforts to gain or lose weight.. Patient's weight will be monitored closely. Demonstration and practice of PLB using pulse oximeter. Brett [name] able to return demonstration satisfactorily. Safety and hand hygiene in the exercise area reviewed with patient. Brett [name] voices understanding of the information reviewed. Department expectations discussed with patient and achievable goals were set. The patient shows enthusiasm about attending the program and we look forward to working with Brett. Brett [name] completed a 6 min walk test today and is scheduled to begin exercise on 05/02/24.   [id number] Brett [name], BSRT

## 2024-04-28 ENCOUNTER — Other Ambulatory Visit: Payer: Self-pay | Admitting: Physician Assistant

## 2024-04-29 ENCOUNTER — Ambulatory Visit

## 2024-04-29 VITALS — BP 132/76 | HR 53 | Ht 74.0 in | Wt 167.0 lb

## 2024-04-29 DIAGNOSIS — M2170 Unequal limb length (acquired), unspecified site: Secondary | ICD-10-CM | POA: Insufficient documentation

## 2024-04-29 DIAGNOSIS — I471 Supraventricular tachycardia, unspecified: Secondary | ICD-10-CM

## 2024-04-29 DIAGNOSIS — I4729 Other ventricular tachycardia: Secondary | ICD-10-CM

## 2024-04-29 DIAGNOSIS — M216X2 Other acquired deformities of left foot: Secondary | ICD-10-CM | POA: Insufficient documentation

## 2024-04-29 DIAGNOSIS — M2042 Other hammer toe(s) (acquired), left foot: Secondary | ICD-10-CM | POA: Insufficient documentation

## 2024-04-29 DIAGNOSIS — M2041 Other hammer toe(s) (acquired), right foot: Secondary | ICD-10-CM | POA: Insufficient documentation

## 2024-04-29 DIAGNOSIS — I429 Cardiomyopathy, unspecified: Secondary | ICD-10-CM

## 2024-04-29 DIAGNOSIS — I5042 Chronic combined systolic (congestive) and diastolic (congestive) heart failure: Secondary | ICD-10-CM

## 2024-04-29 DIAGNOSIS — I4891 Unspecified atrial fibrillation: Secondary | ICD-10-CM

## 2024-04-29 DIAGNOSIS — R269 Unspecified abnormalities of gait and mobility: Secondary | ICD-10-CM | POA: Insufficient documentation

## 2024-04-29 HISTORY — DX: Unspecified atrial fibrillation: I48.91

## 2024-04-29 MED ORDER — APIXABAN 5 MG PO TABS: 5.0000 mg | ORAL_TABLET | Freq: Two times a day (BID) | ORAL | 3 refills | Status: AC

## 2024-04-29 MED ORDER — FUROSEMIDE 20 MG PO TABS: 20.0000 mg | ORAL_TABLET | ORAL | 3 refills | Status: AC

## 2024-04-29 NOTE — Assessment & Plan Note (Signed)
 Asymptomatic. Continue metoprolol  succinate 25 mg once daily.

## 2024-04-29 NOTE — Patient Instructions (Addendum)
 Medication Instructions:  Your physician has recommended you make the following change in your medication:  Start taking an additional Furosemide  20 mg every other day in addition to the 20 mg you take daily Stop Amiodarone   *If you need a refill on your cardiac medications before your next appointment, please call your pharmacy*  Lab Work: NONE If you have labs (blood work) drawn today and your tests are completely normal, you will receive your results only by: MyChart Message (if you have MyChart) OR A paper copy in the mail If you have any lab test that is abnormal or we need to change your treatment, we will call you to review the results.  Testing/Procedures: NONE  Follow-Up: At [facility], you and your health needs are our priority.  As part of our continuing mission to provide you with exceptional heart care, our providers are all part of one team.  This team includes your primary Cardiologist (physician) and Advanced Practice Providers or APPs (Physician Assistants and Nurse Practitioners) who all work together to provide you with the care you need, when you need it.  Your next appointment:   3 month(s)  Provider:   Alean Kobus, MD    We recommend signing up for the patient portal called MyChart.  Sign up information is provided on this After Visit Summary.  MyChart is used to connect with patients for Virtual Visits (Telemedicine).  Patients are able to view lab/test results, encounter notes, upcoming appointments, etc.  Non-urgent messages can be sent to your provider as well.   To learn more about what you can do with MyChart, go to [email or web address].   Other Instructions

## 2024-04-29 NOTE — Assessment & Plan Note (Signed)
 Compensated. Significant functional limitation due to underlying pulmonary issues.  Continue with salt restriction below 2 g/day. Continue furosemide  20 mg once daily and every other day take an additional 20 mg furosemide  in the morning.  GDMT limited due to hypotension and hyperkalemia in the past. Continue metoprolol  succinate 25 mg once daily. Continue Jardiance  10 mg once daily. With the prior hyperkalemia and hypotension issues hold off on Entresto  and spironolactone.

## 2024-04-29 NOTE — Assessment & Plan Note (Signed)
 Postoperative atrial fibrillation July 2025 after his lung left lower lobectomy surgery. Was started on amiodarone  and Eliquis  at the time. Remains in sinus rhythm today. CHA2DS2-VASc score 3.  Continue Eliquis  5 mg twice daily given his overall stroke risk and risk for recurrence of A-fib.  Discontinue amiodarone  however given his underlying pulmonary issues.  If he were to have recurrent episodes of A-fib will consider alternative antiarrhythmics such as Tikosyn.

## 2024-04-29 NOTE — Assessment & Plan Note (Signed)
 Remains asymptomatic at this time. Continue metoprolol  succinate 25 mg once daily.

## 2024-04-29 NOTE — Assessment & Plan Note (Signed)
 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 Etiology unclear. 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 No significant ischemia on cardiac PET stress back in February 2025 and normal LV perfusion in August 2025.  Has follow-up visit pending with Dr. Arnetha this month, there was discussion about considering right and left heart cath if cardiac PET stress shows no active inflammation.

## 2024-04-29 NOTE — Progress Notes (Signed)
 Cardiology Consultation:    Date:  04/29/2024   ID:  Brett [name], DOB [birth year], MRN [id number]  PCP:  Brett Setter, NP  Cardiologist:  Brett [name] Brett Ribaudo, MD   Referring MD: Brett Setter, NP   Chief Complaint  Patient presents with   Follow-up     ASSESSMENT AND PLAN:   [name] 68 year old male  initially seen for short runs of paroxysmal SVT noted during spine surgery in October 2024 requiring esmolol  for termination, subsequently Zio patch noted asymptomatic SVT burden 1.7% and asymptomatic runs of nonsustained ventricular tachycardia on Zio patch monitor with ventricular ectopy burden 1%, low normal LVEF 50 to 55% on echocardiogram December 2024 and no ischemia on cardiac PET stress test from February 2025 but with lower LVEF calculated 46% and coronary artery calcifications in LAD/RCA distribution, further evaluation with cardiac MRI noted LVEF 43% with LGE in basal anterior lateral and anteroseptal segments suggestive of old myocarditis versus sarcoidosis related changes which initiated evaluation for cardiac sarcoidosis and referral to Dr. Arnetha. H/o lung cancer s/p left lower lobectomy July 2025, COPD, Lung nodule now with interval increase in size on imaging from 11/23/2023 [11.7 mm pending evaluation with pulmonologist referred by PCP], smokes half pack of cigarettes a day [recently quit] and drinks 1 to 2 glasses of wine. Problem List Items Addressed This Visit     Paroxysmal SVT (supraventricular tachycardia) (HCC), incidental finding IntraOp back surgery requiring esmolol ; event monitor November 2024 for short runs, longest episode 17 beats.   Remains asymptomatic at this time. Continue metoprolol  succinate 25 mg once daily.      Relevant Medications   apixaban  (ELIQUIS ) 5 MG TABS tablet   furosemide  (LASIX ) 20 MG tablet   Other Relevant Orders   EKG 12-Lead (Completed)   Nonsustained ventricular tachycardia (HCC), identified on event  monitor November 2024, 1 episode 10 beats duration, asymptomatic.   Asymptomatic. Continue metoprolol  succinate 25 mg once daily.      Relevant Medications   apixaban  (ELIQUIS ) 5 MG TABS tablet   furosemide  (LASIX ) 20 MG tablet   Cardiomyopathy (HCC), reduced LVEF on cardiac PET imaging - Primary   Cardiomyopathy with mildly reduced LVEF 43% on cardiac MRI. Etiology unclear. 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 No significant ischemia on cardiac PET stress back in February 2025 and normal LV perfusion in August 2025.  Has follow-up visit pending with Dr. Arnetha this month, there was discussion about considering right and left heart cath if cardiac PET stress shows no active inflammation.        Relevant Medications   apixaban  (ELIQUIS ) 5 MG TABS tablet   furosemide  (LASIX ) 20 MG tablet   Other Relevant Orders   EKG 12-Lead (Completed)   Chronic CHF (congestive heart failure) (HCC)   Compensated. Significant functional limitation due to underlying pulmonary issues.  Continue with salt restriction below 2 g/day. Continue furosemide  20 mg once daily and every other day take an additional 20 mg furosemide  in the morning.  GDMT limited due to hypotension and hyperkalemia in the past. Continue metoprolol  succinate 25 mg once daily. Continue Jardiance  10 mg once daily. With the prior hyperkalemia and hypotension issues hold off on Entresto  and spironolactone.        Relevant Medications   apixaban  (ELIQUIS ) 5 MG TABS tablet   furosemide  (LASIX ) 20 MG tablet   Atrial fibrillation (HCC)   Postoperative atrial fibrillation July 2025 after his lung left lower lobectomy surgery. Was started on amiodarone  and  Eliquis  at the time. Remains in sinus rhythm today. CHA2DS2-VASc score 3.  Continue Eliquis  5 mg twice daily given his overall stroke risk and risk for recurrence of A-fib.  Discontinue amiodarone  however given his underlying pulmonary  issues.  If he were to have recurrent episodes of A-fib will consider alternative antiarrhythmics such as Tikosyn.      Relevant Medications   apixaban  (ELIQUIS ) 5 MG TABS tablet   furosemide  (LASIX ) 20 MG tablet   Low suspicion for cardiac sarcoidosis at this time. However will recommend further evaluation with pulmonologist to assess if there is any suspicion for pulmonary sarcoidosis. He also has appointment pending with rheumatologist given elevated inflammatory markers and rheumatoid factor.  Return to clinic for follow-up with me in 3 months.   History of Present Illness:    Brett [name] is a 68 y.o. male who is being seen today fo for follow-up visit. PCP Brett Setter, NP. Last visit with me in the office was 12/07/2023. Pulmonologist: Dr Brett [name] Cardiology speciality clinic for sarcoidosis: Dr. Arnetha.   initially seen for short runs of paroxysmal SVT noted during spine surgery in October 2024 requiring esmolol  for termination, subsequently Zio patch noted asymptomatic SVT burden 1.7% and asymptomatic runs of nonsustained ventricular tachycardia on Zio patch monitor with ventricular ectopy burden 1%, low normal LVEF 50 to 55% on echocardiogram December 2024 and no ischemia on cardiac PET stress test from February 2025 but with lower LVEF calculated 46% and coronary artery calcifications in LAD/RCA distribution, further evaluation with cardiac MRI noted LVEF 43% with LGE in basal anterior lateral and anteroseptal segments suggestive of old myocarditis versus sarcoidosis related changes which initiated evaluation for cardiac sarcoidosis and referral to Dr. Arnetha. H/o lung cancer s/p left lower lobectomy July 2025, COPD, Lung nodule now with interval increase in size on imaging from 11/23/2023 [11.7 mm pending evaluation with pulmonologist referred by PCP], smokes half pack of cigarettes a day [started on Chantix   and working on quitting] and drinks 1 to 2 glasses  of wine.  Recently in July 2025 underwent a left VATS with robotic left lower lobectomy, postop course complicated with left pneumothorax requiring chest tube placement and subcutaneous emphysema.  Perioperative atrial fibrillation with RVR noted and was started on amiodarone  and subsequently Eliquis , he converted to sinus rhythm subsequently while on amiodarone .  Repeat cardiac FDG PET test August 2025 noted normal LV perfusion, findings inconsistent with active myocardial inflammation or sarcoidosis.  Extracardiac findings were significant for sclerosis of the left posterolateral eighth rib.  Consolidation of left lower lobe with volume loss consistent with prior surgery.  Plan from Dr. Arnetha was to consider right and left heart catht if cardiac PET stress showed no significant abnormalities.  Blood work from 04/12/2024 notes BUN 18, creatinine 1.14, eGFR 70. proBNP 525. EKG in the clinic today shows sinus rhythm heart rate 53/min, PR interval 138 ms, QRS duration 90 ms, QTc 442 ms.  In comparison to prior EKG from 02/24/2024 noted A-fib.  Here for a visit by himself.  Mentions overall he continues to feel short of breath and minimal activity at home getting out of breath. Mild bilateral lower extremity edema extending up to the ankles more on the left compared to the right side.  Has improved somewhat with Lasix  being taken 20 mg once daily. Denies chest pain but his main concern is getting out of breath easily. Denies any palpitations.  Has initiated respiratory rehab.  First evaluation completed recently.   Past  Medical History:  Diagnosis Date   Allergy    Arthritis    Asthma    Bilateral low back pain without sciatica 09/22/2015   Blood transfusion without reported diagnosis    Cancer (HCC)    Left Lower Lobe Adenocarcinoma   Cardiomyopathy (HCC)    Cervical neck pain with evidence of disc disease 09/22/2015   Chicken pox    Congenital hip deformity    Corn of foot  08/09/2022   Degenerative disc disease, cervical 01/29/2015   Dysrhythmia    SVT   Eczema 05/30/2013   Erectile dysfunction 09/22/2015   Glaucoma 05/30/2013   History of cardiac dysrhythmia 05/23/2023   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 08/06/2023   Pain of left hip joint 11/01/2018   Paroxysmal SVT (supraventricular tachycardia) (HCC), incidental finding IntraOp back surgery requiring esmolol ; event monitor November 2024 for short runs, longest episode 17 beats. 06/19/2023   Event monitor November 2024, supraventricular ectopy burden 1.7%.  4 short runs of SVT, longest episode 17 beats.  No symptoms reported     Personal history of congenital hip dysplasia 05/30/2013   Preventative health care 01/29/2015   Rhinitis, allergic 09/22/2015   Rosacea 05/30/2013   Scoliosis deformity of spine 05/05/2020   Spinal stenosis of lumbar region 06/16/2023   Spondylolisthesis at L5-S1 level 09/14/2020   Tobacco abuse 05/30/2013    Past Surgical History:  Procedure Laterality Date   ANTERIOR LAT LUMBAR FUSION Right 05/09/2023   Procedure: Extreme Lateral Interbody Fusion Lumbar three-four -right;  Surgeon: Louis Shove, MD;  Location: [facility] OR;  Service: Neurosurgery;  Laterality: Right;  3C   BRONCHOSCOPY, WITH BIOPSY USING ELECTROMAGNETIC NAVIGATION Bilateral 01/09/2024   Procedure: ROBOTIC ASSISTED NAVIGATIONAL BRONCHOSCOPY;  Surgeon: Isadora Hose, MD;  Location: [facility] ORS;  Service: Pulmonary;  Laterality: Bilateral;   COLONOSCOPY  06/17/2015   Pyrtle   COLONOSCOPY  07/20/2020   Pyrtle   COLONOSCOPY  02/15/2024   ENDOBRONCHIAL ULTRASOUND Bilateral 01/09/2024   Procedure:  ENDOBRONCHIAL ULTRASOUND (EBUS);  Surgeon: Isadora Hose, MD;  Location: [facility] ORS;  Service: Pulmonary;  Laterality: Bilateral;   HAMMER TOE SURGERY  07/28/2011   Procedure: HAMMER TOE CORRECTION;  Surgeon: Toribio JULIANNA Chancy, MD;  Location: [facility];  Service: Orthopedics;  Laterality: Right;  right 2nd and 4th toes correction hammer toe, capsulotomy metatarsal-phalangeal joints   HERNIA REPAIR Bilateral    HIP SURGERY  1986 & 2010   rt total hip-8/10-multiple rt hip surgeries- had 4 prior to 1986 as a child   INTERCOSTAL NERVE BLOCK Left 02/19/2024   Procedure: BLOCK, NERVE, INTERCOSTAL;  Surgeon: Kerrin Elspeth BROCKS, MD;  Location: [facility] OR;  Service: Thoracic;  Laterality: Left;   JOINT REPLACEMENT  2010   Partial Right Hip   LOBECTOMY, LUNG, ROBOT-ASSISTED, USING VATS Left 02/19/2024   Procedure: LOBECTOMY, LUNG, ROBOT-ASSISTED, USING VATS;  Surgeon: Kerrin Elspeth BROCKS, MD;  Location: [facility] OR;  Service: Thoracic;  Laterality: Left;  ROBOTIC LEFT LOWER LOBECTOMY   lumber fusion  09/2020   POLYPECTOMY     SENTINEL NODE BIOPSY Left 02/19/2024   Procedure: BIOPSY, LYMPH NODE;  Surgeon: Kerrin Elspeth BROCKS, MD;  Location: [facility] OR;  Service: Thoracic;  Laterality: Left;   SPINE  SURGERY  09/14/2020   THUMB ARTHROSCOPY  2008   rt   TOTAL HIP ARTHROPLASTY Right 1985   VIDEO BRONCHOSCOPY N/A 02/23/2024   Procedure: VIDEO BRONCHOSCOPY WITHOUT FLUORO;  Surgeon: Kerrin Elspeth BROCKS, MD;  Location: [facility] ENDOSCOPY;  Service: Thoracic;  Laterality: N/A;  Bronch with IBV insertion   VIDEO BRONCHOSCOPY WITH INSERTION OF INTERBRONCHIAL VALVE (IBV) N/A 02/23/2024   Procedure: BRONCHOSCOPY, FLEXIBLE, WITH INTRABRONCHIAL VALVE INSERTION;  Surgeon: Kerrin Elspeth BROCKS, MD;  Location: [facility] ENDOSCOPY;  Service: Thoracic;  Laterality: N/A;    Current Medications: Current Meds  Medication Sig   aspirin  EC 81 MG tablet Take 1 tablet (81 mg total) by mouth daily. Swallow whole.   calcium  carbonate  (TUMS EX) 750 MG chewable tablet Chew 1-2 tablets by mouth 2 (two) times daily as needed for heartburn.   Clotrimazole  1 % OINT Apply to affected area twice daily as needed   empagliflozin  (JARDIANCE ) 10 MG TABS tablet Take 1 tablet (10 mg total) by mouth daily before breakfast.   fluticasone -salmeterol (WIXELA INHUB) 100-50 MCG/ACT AEPB Inhale 1 puff into the lungs 2 (two) times daily.   furosemide  (LASIX ) 20 MG tablet Take 1 tablet (20 mg total) by mouth as directed. Take 20 mg daily and an additional 20 mg every other day   metoprolol  succinate (TOPROL  XL) 25 MG 24 hr tablet Take 1 tablet (25 mg total) by mouth daily.   Multiple Vitamin (MULTIVITAMIN WITH MINERALS) TABS tablet Take 1 tablet by mouth in the morning.   Oxycodone  HCl 10 MG TABS Take 1 tablet by mouth every 6 (six) hours as needed.   OXYGEN  Inhale 2 L/min into the lungs continuous.   STELARA 45 MG/0.5ML injection Inject 45 mg into the skin as directed. Every 12 weeks   Timolol  Maleate, Once-Daily, 0.5 % SOLN Place 1 drop into both eyes in the morning and at bedtime.   TRAVATAN Z 0.004 % SOLN ophthalmic solution Place 1 drop into both eyes every morning.   [DISCONTINUED] amiodarone  (PACERONE ) 200 MG tablet Take 200 mg by mouth two times daily for 7 days; then take 200 mg by mouth daily thereafter   [DISCONTINUED] apixaban  (ELIQUIS ) 5 MG TABS tablet Take 1 tablet (5 mg total) by mouth 2 (two) times daily.   [DISCONTINUED] furosemide  (LASIX ) 20 MG tablet Take 1 tablet (20 mg total) by mouth daily.     Allergies:   Patient has no known allergies.   Social History   Socioeconomic History   Marital status: Widowed    Spouse name: Not on file   Number of children: Not on file   Years of education: Not on file   Highest education level: Associate degree: occupational, Scientist, product/process development, or vocational program  Occupational History   Not on file  Tobacco Use   Smoking status: Former    Current packs/day: 0.00    Average packs/day: 0.5  packs/day for 50.3 years (25.2 ttl pk-yrs)    Types: Cigarettes    Start date: 69    Quit date: 12/11/2023    Years since quitting: 0.3   Smokeless tobacco: Never   Tobacco comments:    Started smoking at 68 years old.    Smoked 1 PPD at his heaviest.    Quit smoking on 12/11/2023  Vaping Use   Vaping status: Some Days  Substance and Sexual Activity   Alcohol use: Yes    Alcohol/week: 14.0 standard drinks of alcohol    Types: 14 Standard drinks or equivalent per week  Drug use: No   Sexual activity: Not Currently    Birth control/protection: None  Other Topics Concern   Not on file  Social History Narrative   Quality control Tech- gauges/callibrations.  (Retired)   Some college/tech school   wife passed   3 grown children (oldest daughter is living with them) youngest daughter lives in [place].  Curator at [employer].  Son lives near [place]- framing/art.   Social Drivers of Corporate investment banker Strain: Low Risk  (02/06/2024)   Overall Financial Resource Strain (CARDIA)    Difficulty of Paying Living Expenses: Not hard at all  Recent Concern: Financial Resource Strain - Medium Risk (12/03/2023)   Overall Financial Resource Strain (CARDIA)    Difficulty of Paying Living Expenses: Somewhat hard  Food Insecurity: No Food Insecurity (03/04/2024)   Hunger Vital Sign    Worried About Running Out of Food in the Last Year: Never true    Ran Out of Food in the Last Year: Never true  Transportation Needs: No Transportation Needs (03/04/2024)   PRAPARE - Administrator, Civil Service (Medical): No    Lack of Transportation (Non-Medical): No  Physical Activity: Inactive (02/06/2024)   Exercise Vital Sign    Days of Exercise per Week: 0 days    Minutes of Exercise per Session: Not on file  Stress: No Stress Concern Present (02/06/2024)   [employer] of Occupational Health - Occupational Stress Questionnaire    Feeling of Stress: Only a little  Social  Connections: Moderately Integrated (02/19/2024)   Social Connection and Isolation Panel    Frequency of Communication with Friends and Family: More than three times a week    Frequency of Social Gatherings with Friends and Family: Three times a week    Attends Religious Services: 1 to 4 times per year    Active Member of Clubs or Organizations: No    Attends Engineer, structural: More than 4 times per year    Marital Status: Widowed  Recent Concern: Social Connections - Moderately Isolated (02/06/2024)   Social Connection and Isolation Panel    Frequency of Communication with Friends and Family: More than three times a week    Frequency of Social Gatherings with Friends and Family: Three times a week    Attends Religious Services: 1 to 4 times per year    Active Member of Clubs or Organizations: No    Attends Banker Meetings: Not on file    Marital Status: Widowed     Family History: The patient's family history includes Alcohol abuse in his brother; Cancer in his cousin; Cancer (age of onset: 57) in his mother; Colon cancer in his cousin and maternal grandmother; Colon cancer (age of onset: 15) in his mother; Colon polyps in his mother; Depression in his brother; Endocrine tumor in his daughter; Lung disease in his father; Rosacea in his father and mother; Thyroid  disease in his son. There is no history of Esophageal cancer, Rectal cancer, or Stomach cancer. ROS:   Please see the history of present illness.    All 14 point review of systems negative except as described per history of present illness.  EKGs/Labs/Other Studies Reviewed:    The following studies were reviewed today:   EKG:  EKG Interpretation Date/Time:  Monday April 29 2024 10:11:41 EDT Ventricular Rate:  53 PR Interval:  138 QRS Duration:  90 QT Interval:  472 QTC Calculation: 442 R Axis:   83  Text Interpretation: Sinus bradycardia When compared with ECG of 24-Feb-2024 07:02, Sinus  rhythm has replaced Atrial fibrillation Vent. rate has decreased BY  77 BPM Non-specific change in ST segment in Anterior leads Nonspecific T wave abnormality no longer evident in Lateral leads Confirmed by Liborio Hai reddy [id number]) on 04/29/2024 10:47:44 AM    Recent Labs: 03/03/2024: Magnesium  2.0 03/14/2024: ALT 13; Hemoglobin 13.7; Platelet Count 308 04/12/2024: BUN 18; Creatinine, Ser 1.14; NT-Pro BNP 525; Potassium 4.7; Sodium 138  Recent Lipid Panel    Component Value Date/Time   CHOL 178 04/24/2020 0741   TRIG 79 04/24/2020 0741   HDL 78 04/24/2020 0741   CHOLHDL 2.3 04/24/2020 0741   VLDL 9.8 04/19/2019 0725   LDLCALC 83 04/24/2020 0741    Physical Exam:    VS:  BP 132/76   Pulse (!) 53   Ht 6' 2 (1.88 m)   Wt 167 lb (75.8 kg)   SpO2 97%   BMI 21.44 kg/m     Wt Readings from Last 3 Encounters:  04/29/24 167 lb (75.8 kg)  04/26/24 169 lb 8.5 oz (76.9 kg)  04/23/24 169 lb (76.7 kg)     GENERAL:  Well nourished, well developed in no acute distress, but tachypneic and remains on nasal flow oxygen . NECK: No JVD; No carotid bruits CARDIAC: RRR, S1 and S2 present, no murmurs, no rubs, no gallops CHEST:  Clear to auscultation without rales, wheezing or rhonchi  Extremities: Bilateral pitting ankle edema.  Pulses bilaterally symmetric with radial 2+ and dorsalis pedis 2+ NEUROLOGIC:  Alert and oriented x 3  Medication Adjustments/Labs and Tests Ordered: Current medicines are reviewed at length with the patient today.  Concerns regarding medicines are outlined above.  Orders Placed This Encounter  Procedures   EKG 12-Lead   Meds ordered this encounter  Medications   apixaban  (ELIQUIS ) 5 MG TABS tablet    Sig: Take 1 tablet (5 mg total) by mouth 2 (two) times daily.    Dispense:  180 tablet    Refill:  3   furosemide  (LASIX ) 20 MG tablet    Sig: Take 1 tablet (20 mg total) by mouth as directed. Take 20 mg daily and an additional 20 mg every other day     Dispense:  135 tablet    Refill:  3    Signed, Molley Houser reddy Kritika Stukes, MD, MPH, [facility]. 04/29/2024 10:55 AM    [facility]

## 2024-04-30 ENCOUNTER — Encounter: Payer: Self-pay | Admitting: Internal Medicine

## 2024-05-02 ENCOUNTER — Encounter (HOSPITAL_COMMUNITY)
Admission: RE | Admit: 2024-05-02 | Discharge: 2024-05-02 | Disposition: A | Discharge status: Home / Self Care | Source: Ambulatory Visit | Attending: Student in an Organized Health Care Education/Training Program | Admitting: Student in an Organized Health Care Education/Training Program

## 2024-05-02 DIAGNOSIS — I5042 Chronic combined systolic (congestive) and diastolic (congestive) heart failure: Secondary | ICD-10-CM

## 2024-05-02 DIAGNOSIS — C3432 Malignant neoplasm of lower lobe, left bronchus or lung: Secondary | ICD-10-CM

## 2024-05-02 NOTE — Progress Notes (Signed)
 Daily Session Note  Patient Details  Name: Brett [name] MRN: [id number] Date of Birth: July 20, 1956 Referring Provider:   [name] Pulmonary Rehab Walk Test from 04/26/2024 in [facility]  Referring Provider [name]    Encounter Date: 05/02/2024  Check In:  Session Check In - 05/02/24 1028       Check-In   Supervising physician immediately available to respond to emergencies [facility] MD immediately available    Physician(s) Lum Louis, NP    Location [facility]    Staff Present Ronal Levin, RN, BSN;Nayef College, RT;David Chil[place]ee, MS, ACSM-CEP, CCRP, Exercise Physiologist;Randi Midge BS, ACSM-CEP, Exercise Physiologist    Virtual Visit No    Medication changes reported     No    Fall or balance concerns reported    No    Tobacco Cessation No Change    Warm-up and Cool-down Performed as group-led instruction    Resistance Training Performed Yes    VAD Patient? No    PAD/SET Patient? No      Pain Assessment   Currently in Pain? No/denies    Multiple Pain Sites No          Capillary Blood Glucose: No results found for this or any previous visit (from the past 24 hours).    Social History   Tobacco Use  Smoking Status Former   Current packs/day: 0.00   Average packs/day: 0.5 packs/day for 50.3 years (25.2 ttl pk-yrs)   Types: Cigarettes   Start date: 32   Quit date: 12/11/2023   Years since quitting: 0.3  Smokeless Tobacco Never  Tobacco Comments   Started smoking at 68 years old.   Smoked 1 PPD at his heaviest.   Quit smoking on 12/11/2023    Goals Met:  Proper associated with RPD/PD & O2 Sat Independence with exercise equipment Exercise tolerated well No report of concerns or symptoms today Strength training completed today  Goals Unmet:  Not Applicable  Comments: Service time is from 1016 to 1143.    Dr. Slater Staff is Medical Director for Pulmonary Rehab at [facility].

## 2024-05-03 DIAGNOSIS — Z902 Acquired absence of lung [part of]: Secondary | ICD-10-CM | POA: Diagnosis not present

## 2024-05-03 DIAGNOSIS — J449 Chronic obstructive pulmonary disease, unspecified: Secondary | ICD-10-CM | POA: Diagnosis not present

## 2024-05-06 NOTE — Progress Notes (Unsigned)
 Cardiology Office Note:  .    Date:  05/07/2024  ID:  Brett [name], DOB July 12, 1956, MRN [id number] PCP: [name], NP  [facility] Providers Cardiologist:  None     CC: Cath eval HF  History of Present Illness: .    Brett [name] is a 68 y.o. male  with paroxysmal atrial fibrillation and heart failure with reduced ejection fraction who presents with persistent shortness of breath.  He experiences persistent shortness of breath that occurs with exertion and sometimes while lying down. He describes a sensation of pressure from his chest radiating up to his ears and down both arms, lasting around five to six minutes. He feels like he is not getting enough air and uses a device to help him breathe through his mouth.  He has a history of paroxysmal atrial fibrillation, for which he has undergone anticoagulation and amiodarone  therapy. He also has heart failure with reduced ejection fraction, with an EF of 45%. He has experienced rare non-sustained ventricular tachycardia and elevated RF levels. He has not tolerated goal-directed medical therapy due to hypotension, with blood pressure readings as low as 92/50 mmHg.  Pulmonary function testing has shown severe airway obstruction and notable emphysema. He has a history of lung cancer and has undergone prior lung surgery and resection. He uses supplemental oxygen  at four liters per minute and requires assistance when walking. Despite these interventions, his breathing has not improved significantly.  Previous evaluations have not shown granulomatous disease, and the patient has been told he does not have sarcoidosis or cardiac amyloidosis. He has lung nodules, which are attributed to his history of cancer. He has two discrete areas of scar in his heart, one of which could be explained by a blockage, but the other does not appear to be related to a blockage.  He is currently on Eliquis  (apixaban ) and aspirin . He mentions that  he was put on an inhaler by a pulmonologist, but due to his glaucoma, he cannot use certain types of inhalers.  He occasionally feels lightheaded and has low blood pressure.  Relevant histories: .  Social  - has a son and two daughters, one who is here and supports his care - mother had HF - they met the kids at  [facility] in ~ 2025 ROS: As per HPI.   Studies Reviewed: .     Cardiac Studies & Procedures   ______________________________________________________________________________________________   STRESS TESTS  NM PET CT CARDIAC PERFUSION MULTI W/ABSOLUTE BLOODFLOW 10/03/2023  Narrative   LV perfusion is normal. There is no evidence of ischemia. There is no evidence of infarction.   Rest left ventricular function is abnormal. Rest global function is moderately reduced. There were no regional wall motion abnormalities. Rest EF: 30%. Stress left ventricular function is abnormal. Stress global function is mildly reduced. There were no regional wall abnormalities. Stress EF: 46%. End diastolic cavity size is mildly enlarged. End systolic cavity size is mildly enlarged.   Myocardial blood flow was computed to be 0.92ml/g/min at rest and 2.46ml/g/min at stress. Global myocardial blood flow reserve was 2.59 and was normal.   Coronary calcium  was present on the attenuation correction CT images. Severe coronary calcifications were present. Coronary calcifications were present in the left anterior descending artery and right coronary artery distribution(s).   Findings are consistent with no ischemia and no infarction. The study is intermediate risk. based on EF note EF 50-55% on recent TTE can consider cardiac MRI to further assess  Normal pefusion and MBFR. Abnormal resting / stress EF Recent echo EF 50-55% Can consider f/u cardiac MRI to further assess Severe calcium  noted in RCA/LAD  CLINICAL DATA:  This over-read does not include interpretation of PET data, cardiac/coronary  anatomy or pathology. The Cardiac PET CT interpretation by the cardiologist is attached.  COMPARISON:  11/21/2022 lung cancer screening CT.  FINDINGS: No pleural fluid. Centrilobular emphysema. Left lower lobe 5 mm subpleural pulmonary nodule on 55/4 is not readily apparent on the prior exam.  Clustered left lower lobe nodules versus area of mucoid impaction at 3-4 mm x 2 on 36/4 are similar to on the prior.  Aortic atherosclerosis.  No imaged thoracic adenopathy.  No acute osseous abnormality.  IMPRESSION: 1.  No acute findings in the imaged extracardiac chest. 2. Left lower lobe pulmonary nodules, including a 5 mm nodule which is not readily apparent on the prior lung cancer screening CT. The patient is due for follow-up lung cancer screening CT on or after 11/21/2023. This nodule can be re-evaluated on that exam. 3. Aortic atherosclerosis (ICD10-I70.0) and emphysema (ICD10-J43.9).   Electronically Signed By: Rockey Kilts M.D. On: 10/03/2023 14:12   ECHOCARDIOGRAM  ECHOCARDIOGRAM COMPLETE 07/12/2023  Narrative ECHOCARDIOGRAM REPORT    Patient Name:   Brett [name] Date of Exam: 07/12/2023 Medical Rec #:  [id number]         Height:       74.0 in Accession #:    [id number]        Weight:       152.0 lb Date of Birth:  [birth year]        BSA:          1.934 m Patient Age:    42 years          BP:           134/72 mmHg Patient Gender: M                 HR:           70 bpm. Exam Location:  [place]  Procedure: 2D Echo, 3D Echo, Cardiac Doppler and Color Doppler  Indications:    Paroxysmal SVT (supraventricular tachycardia) (HCC) [I47.10 (ICD-10-CM)]  History:        Patient has no prior history of Echocardiogram examinations. Arrythmias:Tachycardia; Risk Factors:Current Smoker.  Sonographer:    Alan Greenhouse RDMS, RVT, RDCS Referring Phys: [id number] [name] [name]  IMPRESSIONS   1. Left ventricular ejection fraction, by estimation, is 50 to 55%.  The left ventricle has low normal function. The left ventricle has no regional wall motion abnormalities. Left ventricular diastolic parameters are consistent with Grade I diastolic dysfunction (impaired relaxation). 2. Right ventricular systolic function is normal. The right ventricular size is normal. 3. The mitral valve is normal in structure. No evidence of mitral valve regurgitation. No evidence of mitral stenosis. 4. The aortic valve is normal in structure. Aortic valve regurgitation is not visualized. No aortic stenosis is present. 5. The inferior vena cava is normal in size with greater than 50% respiratory variability, suggesting right atrial pressure of 3 mmHg.  FINDINGS Left Ventricle: Left ventricular ejection fraction, by estimation, is 50 to 55%. The left ventricle has low normal function. The left ventricle has no regional wall motion abnormalities. The left ventricular internal cavity size was normal in size. There is no left ventricular hypertrophy. Left ventricular diastolic parameters are consistent with Grade I diastolic dysfunction (impaired relaxation).  Right Ventricle: The  right ventricular size is normal. No increase in right ventricular wall thickness. Right ventricular systolic function is normal.  Left Atrium: Left atrial size was normal in size.  Right Atrium: Right atrial size was normal in size.  Pericardium: There is no evidence of pericardial effusion.  Mitral Valve: The mitral valve is normal in structure. No evidence of mitral valve regurgitation. No evidence of mitral valve stenosis.  Tricuspid Valve: The tricuspid valve is normal in structure. Tricuspid valve regurgitation is not demonstrated. No evidence of tricuspid stenosis.  Aortic Valve: The aortic valve is normal in structure. Aortic valve regurgitation is not visualized. No aortic stenosis is present. Aortic valve mean gradient measures 3.0 mmHg. Aortic valve peak gradient measures 4.6 mmHg. Aortic  valve area, by VTI measures 1.77 cm.  Pulmonic Valve: The pulmonic valve was normal in structure. Pulmonic valve regurgitation is not visualized. No evidence of pulmonic stenosis.  Aorta: The aortic root is normal in size and structure.  Venous: The inferior vena cava is normal in size with greater than 50% respiratory variability, suggesting right atrial pressure of 3 mmHg.  IAS/Shunts: No atrial level shunt detected by color flow Doppler.   LEFT VENTRICLE PLAX 2D LVIDd:         4.70 cm     Diastology LVIDs:         3.60 cm     LV e' medial:    8.27 cm/s LV PW:         1.30 cm     LV E/e' medial:  6.3 LV IVS:        0.70 cm     LV e' lateral:   7.94 cm/s LVOT diam:     2.00 cm     LV E/e' lateral: 6.5 LV SV:         43 LV SV Index:   22 LVOT Area:     3.14 cm  3D Volume EF: LV Volumes (MOD)           3D EF:        50 % LV vol d, MOD A2C: 77.5 ml LV EDV:       120 ml LV vol d, MOD A4C: 75.2 ml LV ESV:       60 ml LV vol s, MOD A2C: 40.1 ml LV SV:        61 ml LV vol s, MOD A4C: 38.8 ml LV SV MOD A2C:     37.4 ml LV SV MOD A4C:     75.2 ml LV SV MOD BP:      37.5 ml  RIGHT VENTRICLE RV S prime:     10.70 cm/s TAPSE (M-mode): 2.0 cm  LEFT ATRIUM             Index        RIGHT ATRIUM           Index LA diam:        3.20 cm 1.65 cm/m   RA Area:     12.10 cm LA Vol (A2C):   41.6 ml 21.51 ml/m  RA Volume:   27.00 ml  13.96 ml/m LA Vol (A4C):   41.2 ml 21.30 ml/m LA Biplane Vol: 41.9 ml 21.66 ml/m AORTIC VALVE AV Area (Vmax):    1.97 cm AV Area (Vmean):   2.03 cm AV Area (VTI):     1.77 cm AV Vmax:           107.00 cm/s AV Vmean:  80.600 cm/s AV VTI:            0.243 m AV Peak Grad:      4.6 mmHg AV Mean Grad:      3.0 mmHg LVOT Vmax:         67.10 cm/s LVOT Vmean:        52.200 cm/s LVOT VTI:          0.137 m LVOT/AV VTI ratio: 0.56  AORTA Ao Root diam: 3.60 cm Ao Asc diam:  3.00 cm  MITRAL VALVE               TRICUSPID VALVE MV Area (PHT):  3.23 cm    TR Peak grad:   18.3 mmHg MV Decel Time: 235 msec    TR Vmax:        214.00 cm/s MR Peak grad: 11.4 mmHg MR Vmax:      169.00 cm/s  SHUNTS MV E velocity: 52.00 cm/s  Systemic VTI:  0.14 m MV A velocity: 56.90 cm/s  Systemic Diam: 2.00 cm MV E/A ratio:  0.91  Jennifer Crape MD Electronically signed by Jennifer Crape MD Signature Date/Time: 07/12/2023/5:08:36 PM    Final    MONITORS  LONG TERM MONITOR (3-14 DAYS) 07/10/2023  Narrative   Predominantly sinus rhythm, average heart rate 87/min [ranging from 55 bpm to 113 bpm].   Supraventricular ectopy burden 1.7%, with 4 short runs of supraventricular tachycardia with the longest episode lasting 17 beats.   Ventricular ectopy burden less than 1%, with one short run of nonsustained ventricular tachycardia lasting 10 beats.   No symptoms reported and no patient triggered events.   Patch Wear Time:  13 days and 21 hours (2024-11-11T12:16:29-0500 to 2024-11-25T09:52:52-0500)  Patient had a min HR of 55 bpm, max HR of 214 bpm, and avg HR of 87 bpm. Predominant underlying rhythm was Sinus Rhythm. 1 run of Ventricular Tachycardia occurred lasting 10 beats with a max rate of 214 bpm (avg 177 bpm). 4 Supraventricular Tachycardia runs occurred, the run with the fastest interval lasting 17 beats with a max rate of 197 bpm (avg 187 bpm); the run with the fastest interval was also the longest. Isolated SVEs were occasional (1.7%, 71346), SVE Couplets were rare (<1.0%, 5242), and SVE Triplets were rare (<1.0%, 668). Isolated VEs were rare (<1.0%), VE Couplets were rare (<1.0%), and no VE Triplets were present. Ventricular Bigeminy and Trigeminy were present. No patient triggered events.     CARDIAC MRI  MR CARDIAC MORPHOLOGY W WO CONTRAST 11/15/2023  Narrative CLINICAL DATA:  Cardiomyopathy Decreased EF  EXAM: CARDIAC MRI  TECHNIQUE: The patient was scanned on a 1.5 Tesla Siemens magnet. A dedicated cardiac coil was used.  Functional imaging was done using Fiesta sequences. 2,3, and 4 chamber views were done to assess for RWMA's. Modified Simpson's rule using a short axis stack was used to calculate an ejection fraction on a dedicated work [employer]. The patient received 10 cc of Gadavist . After 10 minutes inversion recovery sequences were used to assess for infiltration and scar tissue.  CONTRAST:  Gadavist   FINDINGS: Normal atrial sizes. No PFO/ASD. No pericardial effusion. No LAA thrombus. Normal ascending thoracic aorta 3.3 cm. Tri leaflet AV normal. Mild MV thickening with trivial MR. Normal myocardial thickness septum 8 mm. Mild LVE with global hypokinesis Normal RV size and function  Quantitative LVEF: 43% (EDV 142 cc ESV 81 cc SV 62 cc) Estimated cardiac output by flow analysis 3.1 L/min  Quantitative  RVEF: 55% (EDV 118 cc ESV 54 cc SV 65 cc )  Delayed gadolinium images show a small focal area of subendocardial/mid myocardia uptake in the basal anterolateral wall  And mid myocardial uptake in the basal antero septum.  Parametric measures using Hct 44  T1 mildly elevated 1074 msec  ECV mildly elevated 32%  T2 normal 52 msec  IMPRESSION: 1. Mild LVE with global hypokinesis LVEF 43%  2.  Normal RV size and function RVEF 55%  3. Abnormal gadolinium uptake in basal anterior lateral wall and basal anteroseptal may represent sequelae of old myocarditis or possibly sarcoid. T2 is normal indicating non acute inflammation  4.  Mildly elevated ECV/T1 non in amyloid range  5.  Estimated cardiac output 3.1 L/min  6.  Mild MV thickening with trivial MR  Maude Emmer   Electronically Signed By: Maude Emmer M.D. On: 11/15/2023 13:15   ______________________________________________________________________________________________      RADIOLOGY Cardiac MRI: Nearly transmural subendocardial late gadolinium enhancement in the mid anterolateral wall and epicardial  late gadolinium enhancement in the apical lateral wall  DIAGNOSTIC Pulmonary function testing: Severe airway obstruction and notable emphysema PET stress: Normal global stress flows without evidence of significant stenosis  EKG likely reflect lead reversal  Physical Exam:    VS:  BP 126/75   Pulse 69   Ht 6' 2 (1.88 m)   Wt 167 lb (75.8 kg)   SpO2 94%   BMI 21.44 kg/m    Wt Readings from Last 3 Encounters:  05/07/24 167 lb (75.8 kg)  04/29/24 167 lb (75.8 kg)  04/26/24 169 lb 8.5 oz (76.9 kg)    Gen: No distress  Neck: No JVD Cardiac: No Rubs or Gallops, no Murmur, RRR +2 radial pulses Respiratory: Clear to auscultation bilaterally, largely GI: Soft, nontender, non-distended  MS: non pitting edema;  moves all extremities Integument: Skin feels warn Neuro:  At time of evaluation, alert and oriented to person/place/time/situation  Psych: Normal affect, patient feels fair   ASSESSMENT AND PLAN: .      Heart Failure Reduced Ejection Fraction (combined systolic and diastolic) - acute on chronic - NYHA class III-IV, Stage C, near euvolemic, etiology unclear - Diuretic regimen: Lasix  20 mg /40 mg - has not tolerated ARNI (hypotension, hyperkalemia) BP log reviewed would not support MRA - continue SGLT2i - continue BB - Post op AF on AC and Amiodarone ; prior NSVT - PET testing negative for cardiac sarcoidosis, reached out to his TCTS and PCCM team and no granulomatous disease was found  CAD Pulmonary hypertension, suspected - he has paroxysms of chest pain and pressure despite negative PET MPI; with paroxysms of SOB - Recommend LHC and RHC; despite his exertional sx will defer exercise RHC at this time Risks and benefits of cardiac catheterization have been discussed with the patient.  These include bleeding, infection, kidney damage, stroke, heart attack, death.  The patient understands these risks and is willing to proceed.  Access recommendations: R radial (he is  surprisingly tall- we discussed catheter reach) R AC Procedural considerations no barriers to triple therapy; otherwise DC on Eliquis  monotherapy; if significant PVR I may send him to HF after discussion with Dr. 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 No need for chemotherapy or radiation at this time. Persistent dyspnea attributed  to post-surgical status. - Chronic hypoxemia requiring 4 liters of supplemental oxygen . Unchanged  History of hypokalemia and hyperkalemia Current management avoids spironolactone due to hypokalemia and hyperkalemia.  Time Spent Directly with Patient:   I have spent a total of 41 minutes with the patient reviewing notes, imaging, EKGs, labs, prior CMR and examining the patient as well as establishing an assessment and plan that was discussed personally with the patient.    Stanly Leavens, MD FASE [facility] Cardiologist [facility]  [address] [place], [place] [address] [phone]  12:42 PM

## 2024-05-06 NOTE — H&P (View-Only) (Signed)
 Cardiology Office Note:  .    Date:  05/07/2024  ID:  Brett [name], DOB 28-Mar-1956, MRN [id number] PCP: Brett Setter, NP  [facility] Providers Cardiologist:  None     CC: Cath eval HF  History of Present Illness: .    Brett [name] is a 68 y.o. male  with paroxysmal atrial fibrillation and heart failure with reduced ejection fraction who presents with persistent shortness of breath.  He experiences persistent shortness of breath that occurs with exertion and sometimes while lying down. He describes a sensation of pressure from his chest radiating up to his ears and down both arms, lasting around five to six minutes. He feels like he is not getting enough air and uses a device to help him breathe through his mouth.  He has a history of paroxysmal atrial fibrillation, for which he has undergone anticoagulation and amiodarone  therapy. He also has heart failure with reduced ejection fraction, with an EF of 45%. He has experienced rare non-sustained ventricular tachycardia and elevated RF levels. He has not tolerated goal-directed medical therapy due to hypotension, with blood pressure readings as low as 92/50 mmHg.  Pulmonary function testing has shown severe airway obstruction and notable emphysema. He has a history of lung cancer and has undergone prior lung surgery and resection. He uses supplemental oxygen  at four liters per minute and requires assistance when walking. Despite these interventions, his breathing has not improved significantly.  Previous evaluations have not shown granulomatous disease, and the patient has been told he does not have sarcoidosis or cardiac amyloidosis. He has lung nodules, which are attributed to his history of cancer. He has two discrete areas of scar in his heart, one of which could be explained by a blockage, but the other does not appear to be related to a blockage.  He is currently on Eliquis  (apixaban ) and aspirin . He mentions that  he was put on an inhaler by a pulmonologist, but due to his glaucoma, he cannot use certain types of inhalers.  He occasionally feels lightheaded and has low blood pressure.  Relevant histories: .  Social  - has a son and two daughters, one who is here and supports his care - mother had HF - they met the kids at  [facility] in ~ 2025 ROS: As per HPI.   Studies Reviewed: .     Cardiac Studies & Procedures   ______________________________________________________________________________________________   STRESS TESTS  NM PET CT CARDIAC PERFUSION MULTI W/ABSOLUTE BLOODFLOW 10/03/2023  Narrative   LV perfusion is normal. There is no evidence of ischemia. There is no evidence of infarction.   Rest left ventricular function is abnormal. Rest global function is moderately reduced. There were no regional wall motion abnormalities. Rest EF: 30%. Stress left ventricular function is abnormal. Stress global function is mildly reduced. There were no regional wall abnormalities. Stress EF: 46%. End diastolic cavity size is mildly enlarged. End systolic cavity size is mildly enlarged.   Myocardial blood flow was computed to be 0.92ml/g/min at rest and 2.87ml/g/min at stress. Global myocardial blood flow reserve was 2.59 and was normal.   Coronary calcium  was present on the attenuation correction CT images. Severe coronary calcifications were present. Coronary calcifications were present in the left anterior descending artery and right coronary artery distribution(s).   Findings are consistent with no ischemia and no infarction. The study is intermediate risk. based on EF note EF 50-55% on recent TTE can consider cardiac MRI to further assess  Normal pefusion and MBFR. Abnormal resting / stress EF Recent echo EF 50-55% Can consider f/u cardiac MRI to further assess Severe calcium  noted in RCA/LAD  CLINICAL DATA:  This over-read does not include interpretation of PET data, cardiac/coronary  anatomy or pathology. The Cardiac PET CT interpretation by the cardiologist is attached.  COMPARISON:  11/21/2022 lung cancer screening CT.  FINDINGS: No pleural fluid. Centrilobular emphysema. Left lower lobe 5 mm subpleural pulmonary nodule on 55/4 is not readily apparent on the prior exam.  Clustered left lower lobe nodules versus area of mucoid impaction at 3-4 mm x 2 on 36/4 are similar to on the prior.  Aortic atherosclerosis.  No imaged thoracic adenopathy.  No acute osseous abnormality.  IMPRESSION: 1.  No acute findings in the imaged extracardiac chest. 2. Left lower lobe pulmonary nodules, including a 5 mm nodule which is not readily apparent on the prior lung cancer screening CT. The patient is due for follow-up lung cancer screening CT on or after 11/21/2023. This nodule can be re-evaluated on that exam. 3. Aortic atherosclerosis (ICD10-I70.0) and emphysema (ICD10-J43.9).   Electronically Signed By: Brett [name] M.D. On: 10/03/2023 14:12   ECHOCARDIOGRAM  ECHOCARDIOGRAM COMPLETE 07/12/2023  Narrative ECHOCARDIOGRAM REPORT    Patient Name:   Brett [name] Date of Exam: 07/12/2023 Medical Rec #:  [id number]         Height:       74.0 in Accession #:    [id number]        Weight:       152.0 lb Date of Birth:  14-Aug-1955        BSA:          1.934 m Patient Age:    33 years          BP:           134/72 mmHg Patient Gender: M                 HR:           70 bpm. Exam Location:  [place]  Procedure: 2D Echo, 3D Echo, Cardiac Doppler and Color Doppler  Indications:    Paroxysmal SVT (supraventricular tachycardia) (HCC) [I47.10 (ICD-10-CM)]  History:        Patient has no prior history of Echocardiogram examinations. Arrythmias:Tachycardia; Risk Factors:Current Smoker.  Sonographer:    Brett [name] RDMS, RVT, RDCS Referring Phys: [id number] Brett [name]  IMPRESSIONS   1. Left ventricular ejection fraction, by estimation, is 50 to 55%.  The left ventricle has low normal function. The left ventricle has no regional wall motion abnormalities. Left ventricular diastolic parameters are consistent with Grade I diastolic dysfunction (impaired relaxation). 2. Right ventricular systolic function is normal. The right ventricular size is normal. 3. The mitral valve is normal in structure. No evidence of mitral valve regurgitation. No evidence of mitral stenosis. 4. The aortic valve is normal in structure. Aortic valve regurgitation is not visualized. No aortic stenosis is present. 5. The inferior vena cava is normal in size with greater than 50% respiratory variability, suggesting right atrial pressure of 3 mmHg.  FINDINGS Left Ventricle: Left ventricular ejection fraction, by estimation, is 50 to 55%. The left ventricle has low normal function. The left ventricle has no regional wall motion abnormalities. The left ventricular internal cavity size was normal in size. There is no left ventricular hypertrophy. Left ventricular diastolic parameters are consistent with Grade I diastolic dysfunction (impaired relaxation).  Right Ventricle: The  right ventricular size is normal. No increase in right ventricular wall thickness. Right ventricular systolic function is normal.  Left Atrium: Left atrial size was normal in size.  Right Atrium: Right atrial size was normal in size.  Pericardium: There is no evidence of pericardial effusion.  Mitral Valve: The mitral valve is normal in structure. No evidence of mitral valve regurgitation. No evidence of mitral valve stenosis.  Tricuspid Valve: The tricuspid valve is normal in structure. Tricuspid valve regurgitation is not demonstrated. No evidence of tricuspid stenosis.  Aortic Valve: The aortic valve is normal in structure. Aortic valve regurgitation is not visualized. No aortic stenosis is present. Aortic valve mean gradient measures 3.0 mmHg. Aortic valve peak gradient measures 4.6 mmHg. Aortic  valve area, by VTI measures 1.77 cm.  Pulmonic Valve: The pulmonic valve was normal in structure. Pulmonic valve regurgitation is not visualized. No evidence of pulmonic stenosis.  Aorta: The aortic root is normal in size and structure.  Venous: The inferior vena cava is normal in size with greater than 50% respiratory variability, suggesting right atrial pressure of 3 mmHg.  IAS/Shunts: No atrial level shunt detected by color flow Doppler.   LEFT VENTRICLE PLAX 2D LVIDd:         4.70 cm     Diastology LVIDs:         3.60 cm     LV e' medial:    8.27 cm/s LV PW:         1.30 cm     LV E/e' medial:  6.3 LV IVS:        0.70 cm     LV e' lateral:   7.94 cm/s LVOT diam:     2.00 cm     LV E/e' lateral: 6.5 LV SV:         43 LV SV Index:   22 LVOT Area:     3.14 cm  3D Volume EF: LV Volumes (MOD)           3D EF:        50 % LV vol d, MOD A2C: 77.5 ml LV EDV:       120 ml LV vol d, MOD A4C: 75.2 ml LV ESV:       60 ml LV vol s, MOD A2C: 40.1 ml LV SV:        61 ml LV vol s, MOD A4C: 38.8 ml LV SV MOD A2C:     37.4 ml LV SV MOD A4C:     75.2 ml LV SV MOD BP:      37.5 ml  RIGHT VENTRICLE RV S prime:     10.70 cm/s TAPSE (M-mode): 2.0 cm  LEFT ATRIUM             Index        RIGHT ATRIUM           Index LA diam:        3.20 cm 1.65 cm/m   RA Area:     12.10 cm LA Vol (A2C):   41.6 ml 21.51 ml/m  RA Volume:   27.00 ml  13.96 ml/m LA Vol (A4C):   41.2 ml 21.30 ml/m LA Biplane Vol: 41.9 ml 21.66 ml/m AORTIC VALVE AV Area (Vmax):    1.97 cm AV Area (Vmean):   2.03 cm AV Area (VTI):     1.77 cm AV Vmax:           107.00 cm/s AV Vmean:  80.600 cm/s AV VTI:            0.243 m AV Peak Grad:      4.6 mmHg AV Mean Grad:      3.0 mmHg LVOT Vmax:         67.10 cm/s LVOT Vmean:        52.200 cm/s LVOT VTI:          0.137 m LVOT/AV VTI ratio: 0.56  AORTA Ao Root diam: 3.60 cm Ao Asc diam:  3.00 cm  MITRAL VALVE               TRICUSPID VALVE MV Area (PHT):  3.23 cm    TR Peak grad:   18.3 mmHg MV Decel Time: 235 msec    TR Vmax:        214.00 cm/s MR Peak grad: 11.4 mmHg MR Vmax:      169.00 cm/s  SHUNTS MV E velocity: 52.00 cm/s  Systemic VTI:  0.14 m MV A velocity: 56.90 cm/s  Systemic Diam: 2.00 cm MV E/A ratio:  0.91  Jennifer Crape MD Electronically signed by Jennifer Crape MD Signature Date/Time: 07/12/2023/5:08:36 PM    Final    MONITORS  LONG TERM MONITOR (3-14 DAYS) 07/10/2023  Narrative   Predominantly sinus rhythm, average heart rate 87/min [ranging from 55 bpm to 113 bpm].   Supraventricular ectopy burden 1.7%, with 4 short runs of supraventricular tachycardia with the longest episode lasting 17 beats.   Ventricular ectopy burden less than 1%, with one short run of nonsustained ventricular tachycardia lasting 10 beats.   No symptoms reported and no patient triggered events.   Patch Wear Time:  13 days and 21 hours (2024-11-11T12:16:29-0500 to 2024-11-25T09:52:52-0500)  Patient had a min HR of 55 bpm, max HR of 214 bpm, and avg HR of 87 bpm. Predominant underlying rhythm was Sinus Rhythm. 1 run of Ventricular Tachycardia occurred lasting 10 beats with a max rate of 214 bpm (avg 177 bpm). 4 Supraventricular Tachycardia runs occurred, the run with the fastest interval lasting 17 beats with a max rate of 197 bpm (avg 187 bpm); the run with the fastest interval was also the longest. Isolated SVEs were occasional (1.7%, 71346), SVE Couplets were rare (<1.0%, 5242), and SVE Triplets were rare (<1.0%, 668). Isolated VEs were rare (<1.0%), VE Couplets were rare (<1.0%), and no VE Triplets were present. Ventricular Bigeminy and Trigeminy were present. No patient triggered events.     CARDIAC MRI  MR CARDIAC MORPHOLOGY W WO CONTRAST 11/15/2023  Narrative CLINICAL DATA:  Cardiomyopathy Decreased EF  EXAM: CARDIAC MRI  TECHNIQUE: The patient was scanned on a 1.5 Tesla Siemens magnet. A dedicated cardiac coil was used.  Functional imaging was done using Fiesta sequences. 2,3, and 4 chamber views were done to assess for RWMA's. Modified Simpson's rule using a short axis stack was used to calculate an ejection fraction on a dedicated work [employer]. The patient received 10 cc of Gadavist . After 10 minutes inversion recovery sequences were used to assess for infiltration and scar tissue.  CONTRAST:  Gadavist   FINDINGS: Normal atrial sizes. No PFO/ASD. No pericardial effusion. No LAA thrombus. Normal ascending thoracic aorta 3.3 cm. Tri leaflet AV normal. Mild MV thickening with trivial MR. Normal myocardial thickness septum 8 mm. Mild LVE with global hypokinesis Normal RV size and function  Quantitative LVEF: 43% (EDV 142 cc ESV 81 cc SV 62 cc) Estimated cardiac output by flow analysis 3.1 L/min  Quantitative  RVEF: 55% (EDV 118 cc ESV 54 cc SV 65 cc )  Delayed gadolinium images show a small focal area of subendocardial/mid myocardia uptake in the basal anterolateral wall  And mid myocardial uptake in the basal antero septum.  Parametric measures using Hct 44  T1 mildly elevated 1074 msec  ECV mildly elevated 32%  T2 normal 52 msec  IMPRESSION: 1. Mild LVE with global hypokinesis LVEF 43%  2.  Normal RV size and function RVEF 55%  3. Abnormal gadolinium uptake in basal anterior lateral wall and basal anteroseptal may represent sequelae of old myocarditis or possibly sarcoid. T2 is normal indicating non acute inflammation  4.  Mildly elevated ECV/T1 non in amyloid range  5.  Estimated cardiac output 3.1 L/min  6.  Mild MV thickening with trivial MR  Maude Emmer   Electronically Signed By: Maude Emmer M.D. On: 11/15/2023 13:15   ______________________________________________________________________________________________      RADIOLOGY Cardiac MRI: Nearly transmural subendocardial late gadolinium enhancement in the mid anterolateral wall and epicardial  late gadolinium enhancement in the apical lateral wall  DIAGNOSTIC Pulmonary function testing: Severe airway obstruction and notable emphysema PET stress: Normal global stress flows without evidence of significant stenosis  EKG likely reflect lead reversal  Physical Exam:    VS:  BP 126/75   Pulse 69   Ht 6' 2 (1.88 m)   Wt 167 lb (75.8 kg)   SpO2 94%   BMI 21.44 kg/m    Wt Readings from Last 3 Encounters:  05/07/24 167 lb (75.8 kg)  04/29/24 167 lb (75.8 kg)  04/26/24 169 lb 8.5 oz (76.9 kg)    Gen: No distress  Neck: No JVD Cardiac: No Rubs or Gallops, no Murmur, RRR +2 radial pulses Respiratory: Clear to auscultation bilaterally, largely GI: Soft, nontender, non-distended  MS: non pitting edema;  moves all extremities Integument: Skin feels warn Neuro:  At time of evaluation, alert and oriented to person/place/time/situation  Psych: Normal affect, patient feels fair   ASSESSMENT AND PLAN: .      Heart Failure Reduced Ejection Fraction (combined systolic and diastolic) - acute on chronic - NYHA class III-IV, Stage C, near euvolemic, etiology unclear - Diuretic regimen: Lasix  20 mg /40 mg - has not tolerated ARNI (hypotension, hyperkalemia) BP log reviewed would not support MRA - continue SGLT2i - continue BB - Post op AF on AC and Amiodarone ; prior NSVT - PET testing negative for cardiac sarcoidosis, reached out to his TCTS and PCCM team and no granulomatous disease was found  CAD Pulmonary hypertension, suspected - he has paroxysms of chest pain and pressure despite negative PET MPI; with paroxysms of SOB - Recommend LHC and RHC; despite his exertional sx will defer exercise RHC at this time Risks and benefits of cardiac catheterization have been discussed with the patient.  These include bleeding, infection, kidney damage, stroke, heart attack, death.  The patient understands these risks and is willing to proceed.  Access recommendations: R radial (he is  surprisingly tall- we discussed catheter reach) R AC Procedural considerations no barriers to triple therapy; otherwise DC on Eliquis  monotherapy; if significant PVR I may send him to HF after discussion with Dr. 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 No need for chemotherapy or radiation at this time. Persistent dyspnea attributed  to post-surgical status. - Chronic hypoxemia requiring 4 liters of supplemental oxygen . Unchanged  History of hypokalemia and hyperkalemia Current management avoids spironolactone due to hypokalemia and hyperkalemia.  Time Spent Directly with Patient:   I have spent a total of 41 minutes with the patient reviewing notes, imaging, EKGs, labs, prior CMR and examining the patient as well as establishing an assessment and plan that was discussed personally with the patient.    Stanly Leavens, MD FASE [facility] Cardiologist [facility]  [address] [place], [place] [address] [phone]  12:42 PM

## 2024-05-07 ENCOUNTER — Encounter (HOSPITAL_COMMUNITY)
Admission: RE | Admit: 2024-05-07 | Discharge: 2024-05-07 | Disposition: A | Source: Ambulatory Visit | Attending: Student in an Organized Health Care Education/Training Program | Admitting: Student in an Organized Health Care Education/Training Program

## 2024-05-07 ENCOUNTER — Ambulatory Visit: Attending: Internal Medicine | Admitting: Internal Medicine

## 2024-05-07 ENCOUNTER — Other Ambulatory Visit (HOSPITAL_BASED_OUTPATIENT_CLINIC_OR_DEPARTMENT_OTHER): Payer: Self-pay

## 2024-05-07 VITALS — BP 126/75 | HR 69 | Ht 74.0 in | Wt 167.0 lb

## 2024-05-07 DIAGNOSIS — I429 Cardiomyopathy, unspecified: Secondary | ICD-10-CM | POA: Diagnosis not present

## 2024-05-07 DIAGNOSIS — I5042 Chronic combined systolic (congestive) and diastolic (congestive) heart failure: Secondary | ICD-10-CM | POA: Diagnosis not present

## 2024-05-07 DIAGNOSIS — I509 Heart failure, unspecified: Secondary | ICD-10-CM | POA: Diagnosis not present

## 2024-05-07 DIAGNOSIS — I4729 Other ventricular tachycardia: Secondary | ICD-10-CM

## 2024-05-07 DIAGNOSIS — I471 Supraventricular tachycardia, unspecified: Secondary | ICD-10-CM | POA: Diagnosis not present

## 2024-05-07 DIAGNOSIS — C3432 Malignant neoplasm of lower lobe, left bronchus or lung: Secondary | ICD-10-CM

## 2024-05-07 DIAGNOSIS — I4891 Unspecified atrial fibrillation: Secondary | ICD-10-CM | POA: Diagnosis not present

## 2024-05-07 LAB — CBC
Hematocrit: 43.4 % (ref 37.5–51.0)
Hemoglobin: 14 g/dL (ref 13.0–17.7)
MCH: 31.8 pg (ref 26.6–33.0)
MCHC: 32.3 g/dL (ref 31.5–35.7)
MCV: 99 fL — ABNORMAL HIGH (ref 79–97)
Platelets: 231 x10E3/uL (ref 150–450)
RBC: 4.4 x10E6/uL (ref 4.14–5.80)
RDW: 12.9 % (ref 11.6–15.4)
WBC: 8.2 x10E3/uL (ref 3.4–10.8)

## 2024-05-07 MED ORDER — COMIRNATY 30 MCG/0.3ML IM SUSY
0.3000 mL | PREFILLED_SYRINGE | Freq: Once | INTRAMUSCULAR | 0 refills | Status: AC
Start: 2024-05-07 — End: 2024-05-08
  Filled 2024-05-07: qty 0.3, 1d supply, fill #0

## 2024-05-07 MED ORDER — FLUZONE HIGH-DOSE 0.5 ML IM SUSY
0.5000 mL | PREFILLED_SYRINGE | Freq: Once | INTRAMUSCULAR | 0 refills | Status: AC
  Filled 2024-05-07: qty 0.5, 1d supply, fill #0

## 2024-05-07 NOTE — Patient Instructions (Addendum)
 Medication Instructions:  Your physician recommends that you continue on your current medications as directed. Please refer to the Current Medication list given to you today.  *If you need a refill on your cardiac medications before your next appointment, please call your pharmacy*  Lab Work:  CBC at [facility]  If you have labs (blood work) drawn today and your tests are completely normal, you will receive your results only by: MyChart Message (if you have MyChart) OR A paper copy in the mail If you have any lab test that is abnormal or we need to change your treatment, we will call you to review the results.  Testing/Procedures: Your physician has requested that you have a cardiac catheterization. Cardiac catheterization is used to diagnose and/or treat various heart conditions. Doctors may recommend this procedure for a number of different reasons. The most common reason is to evaluate chest pain. Chest pain can be a symptom of coronary artery disease (CAD), and cardiac catheterization can show whether plaque is narrowing or blocking your heart's arteries. This procedure is also used to evaluate the valves, as well as measure the blood flow and oxygen  levels in different parts of your heart. For further information please visit [email or web address] Please follow instruction sheet, as given.   Follow-Up:As needed  At [facility], you and your health needs are our priority.  As part of our continuing mission to provide you with exceptional heart care, our providers are all part of one team.  This team includes your primary Cardiologist (physician) and Advanced Practice Providers or APPs (Physician Assistants and Nurse Practitioners) who all work together to provide you with the care you need, when you need it.   Provider:   Stanly Leavens, MD   Other Instructions   Callaway S Batz  05/07/2024  You are scheduled for a Cardiac Catheterization on Friday, October 3 with Dr.  [name].  1. Please arrive at the [facility] (Main Entrance A) at [facility]: [address] [place], [place] [address] at 8:30 AM (This time is 2 hour(s) before your procedure to ensure your preparation).   Free valet parking service is available. You will check in at ADMITTING. The support person will be asked to wait in the waiting room.  It is OK to have someone drop you off and come back when you are ready to be discharged.    Special note: Every effort is made to have your procedure done on time. Please understand that emergencies sometimes delay scheduled procedures.  2. Diet: Nothing to eat after midnight.   3. Hydration: On October 3, you may drink approved liquids (see below) until 2 hours before the procedure with 8 oz of water as your last intake.   List of approved liquids water, clear juice, clear tea, black coffee, fruit juices, non-citric and without pulp, carbonated beverages, Gatorade, Kool -Aid, plain Jello-O and plain ice popsicles.  4. Labs: You will need to have blood drawn Today   5. Medication instructions in preparation for your procedure:   Contrast Allergy: No    Stop taking Eliquis  (Apixiban) on Wednesday, October 1. Your last dose of Eliquis  will be the night of Sept 30, 2025.  Do Not take empagliflozin  (Jardiance ) until after your Heart Cath.   Do Not take furosemide  (Lasix ) the morning of Heart Cath.   On the morning of your procedure, take your Aspirin  81 mg and any morning medicines NOT listed above.  You may use sips of water.  6. Plan to go home the same day, you will only stay overnight if medically necessary. 7. Bring a current list of your medications and current insurance cards. 8. You MUST have a responsible person to drive you home. 9. Someone MUST be with you the first 24 hours after you arrive home or your discharge will be delayed. 10. Please wear clothes that are easy to get on and off and wear slip-on shoes.  Thank  you for allowing us  to care for you!   -- [facility]

## 2024-05-07 NOTE — Progress Notes (Signed)
 Daily Session Note  Patient Details  Name: Brett [name] MRN: [id number] Date of Birth: 06-20-1956 Referring Provider:   [name] Pulmonary Rehab Walk Test from 04/26/2024 in [facility]  Referring Provider [name]    Encounter Date: 05/07/2024  Check In:  Session Check In - 05/07/24 [id number]       Check-In   Supervising physician immediately available to respond to emergencies [facility] MD immediately available    Physician(s) Rosaline Skains, NP    Location [facility]    Staff Present [name], Neita Moats, MS, ACSM-CEP, Exercise Physiologist;Mary Harvy, RN, BSN    Virtual Visit No    Medication changes reported     No    Fall or balance concerns reported    No    Tobacco Cessation No Change    Warm-up and Cool-down Performed as group-led instruction    Resistance Training Performed Yes    VAD Patient? No    PAD/SET Patient? No      Pain Assessment   Currently in Pain? No/denies    Multiple Pain Sites No          Capillary Blood Glucose: No results found for this or any previous visit (from the past 24 hours).   Exercise Prescription Changes - 05/07/24 0900       Response to Exercise   Blood Pressure (Admit) 112/58    Blood Pressure (Exercise) 92/52    Blood Pressure (Exit) 110/62    Heart Rate (Admit) 50 bpm    Heart Rate (Exercise) 57 bpm    Heart Rate (Exit) 61 bpm    Oxygen  Saturation (Admit) 99 %    Oxygen  Saturation (Exercise) 97 %    Oxygen  Saturation (Exit) 100 %    Rating of Perceived Exertion (Exercise) 14    Perceived Dyspnea (Exercise) 1    Symptoms Dizziness    Duration Progress to 30 minutes of  aerobic without signs/symptoms of physical distress    Intensity THRR unchanged      Progression   Progression Continue to progress workloads to maintain intensity without signs/symptoms of physical distress.      Resistance Training   Training Prescription Yes     Weight red bands    Reps 10-15    Time 10 Minutes      Oxygen    Oxygen  Continuous    Liters 4      NuStep   Level 1    SPM 54    Minutes 30    METs 1.7      Oxygen    Maintain Oxygen  Saturation 88% or higher          Social History   Tobacco Use  Smoking Status Former   Current packs/day: 0.00   Average packs/day: 0.5 packs/day for 50.3 years (25.2 ttl pk-yrs)   Types: Cigarettes   Start date: 55   Quit date: 12/11/2023   Years since quitting: 0.4  Smokeless Tobacco Never  Tobacco Comments   Started smoking at 68 years old.   Smoked 1 PPD at his heaviest.   Quit smoking on 12/11/2023    Goals Met:  Proper associated with RPD/PD & O2 Sat Exercise tolerated well No report of concerns or symptoms today Strength training completed today  Goals Unmet:  Not Applicable  Comments: Service time is from 0811 to 0920.    Dr. Slater Staff is Medical Director for Pulmonary Rehab at [facility].

## 2024-05-08 ENCOUNTER — Ambulatory Visit: Payer: Self-pay | Admitting: Internal Medicine

## 2024-05-08 NOTE — Progress Notes (Signed)
 Pulmonary Individual Treatment Plan  Patient Details  Name: Brett [name] MRN: [id number] Date of Birth: 10-01-1955 Referring Provider:   [name] Pulmonary Rehab Walk Test from 04/26/2024 in [facility]  Referring Provider [name]    Initial Encounter Date:  Flowsheet Row Pulmonary Rehab Walk Test from 04/26/2024 in [facility]  Date 04/26/24    Visit Diagnosis: Malignant neoplasm of lower lobe bronchus, left (HCC)  Chronic combined systolic and diastolic heart failure (HCC)  Patient's Home Medications on Admission:   Current Outpatient Medications:    acetaminophen  (TYLENOL ) 500 MG tablet, Take 500 mg by mouth every 6 (six) hours as needed for moderate pain (pain score 4-6)., Disp: , Rfl:    apixaban  (ELIQUIS ) 5 MG TABS tablet, Take 1 tablet (5 mg total) by mouth 2 (two) times daily., Disp: 180 tablet, Rfl: 3   aspirin  EC 81 MG tablet, Take 1 tablet (81 mg total) by mouth daily. Swallow whole., Disp: , Rfl:    calcium  carbonate (TUMS EX) 750 MG chewable tablet, Chew 1-2 tablets by mouth 2 (two) times daily as needed for heartburn., Disp: , Rfl:    Clotrimazole  1 % OINT, Apply to affected area twice daily as needed, Disp: 56.7 g, Rfl: 0   COVID-19 mRNA vaccine, Pfizer, (COMIRNATY ) syringe, Inject 0.3 mLs into the muscle once for 1 dose. (Patient not taking: Reported on 05/07/2024), Disp: 0.3 mL, Rfl: 0   empagliflozin  (JARDIANCE ) 10 MG TABS tablet, Take 1 tablet (10 mg total) by mouth daily before breakfast., Disp: 90 tablet, Rfl: 1   fluticasone -salmeterol (WIXELA INHUB) 100-50 MCG/ACT AEPB, Inhale 1 puff into the lungs 2 (two) times daily., Disp: 60 each, Rfl: 11   furosemide  (LASIX ) 20 MG tablet, Take 1 tablet (20 mg total) by mouth as directed. Take 20 mg daily and an additional 20 mg every other day, Disp: 135 tablet, Rfl: 3   ibuprofen  (ADVIL ) 200 MG tablet,  Take 200 mg by mouth every 6 (six) hours as needed for moderate pain (pain score 4-6)., Disp: , Rfl:    Influenza vac split trivalent PF (FLUZONE HIGH-DOSE) 0.5 ML injection, Inject 0.5 mLs into the muscle once for 1 dose. (Patient not taking: Reported on 05/07/2024), Disp: 0.5 mL, Rfl: 0   metoprolol  succinate (TOPROL  XL) 25 MG 24 hr tablet, Take 1 tablet (25 mg total) by mouth daily., Disp: 90 tablet, Rfl: 3   Multiple Vitamin (MULTIVITAMIN WITH MINERALS) TABS tablet, Take 1 tablet by mouth in the morning., Disp: , Rfl:    Oxycodone  HCl 10 MG TABS, Take 1 tablet by mouth every 6 (six) hours as needed., Disp: , Rfl:    OXYGEN , Inhale 2 L/min into the lungs continuous., Disp: , Rfl:    STELARA 45 MG/0.5ML injection, Inject 45 mg into the skin as directed. Every 12 weeks, Disp: , Rfl:    Timolol  Maleate, Once-Daily, 0.5 % SOLN, Place 1 drop into both eyes in the morning and at bedtime., Disp: , Rfl:    TRAVATAN Z 0.004 % SOLN ophthalmic solution, Place 1 drop into both eyes every morning., Disp: , Rfl: 1  Past Medical History: Past Medical History:  Diagnosis Date   Allergy    Arthritis    Asthma    Bilateral low back pain without sciatica 09/22/2015   Blood transfusion without reported diagnosis    Cancer (HCC)    Left Lower Lobe  Adenocarcinoma   Cardiomyopathy (HCC)    Cervical neck pain with evidence of disc disease 09/22/2015   Chicken pox    Congenital hip deformity    Corn of foot 08/09/2022   Degenerative disc disease, cervical 01/29/2015   Dysrhythmia    SVT   Eczema 05/30/2013   Erectile dysfunction 09/22/2015   Glaucoma 05/30/2013   History of cardiac dysrhythmia 05/23/2023   Isthmic spondylolisthesis 05/05/2020   Lumbar adjacent segment disease with spondylolisthesis 05/09/2023   Need for shingles vaccine 09/22/2015   Neuromuscular disorder (HCC) 11/2019   sciatica   Nodule of left lung    Nonsustained ventricular tachycardia (HCC), identified on event monitor November  2024, 1 episode 10 beats duration, asymptomatic. 08/06/2023   Pain of left hip joint 11/01/2018   Paroxysmal SVT (supraventricular tachycardia) (HCC), incidental finding IntraOp back surgery requiring esmolol ; event monitor November 2024 for short runs, longest episode 17 beats. 06/19/2023   Event monitor November 2024, supraventricular ectopy burden 1.7%.  4 short runs of SVT, longest episode 17 beats.  No symptoms reported     Personal history of congenital hip dysplasia 05/30/2013   Preventative health care 01/29/2015   Rhinitis, allergic 09/22/2015   Rosacea 05/30/2013   Scoliosis deformity of spine 05/05/2020   Spinal stenosis of lumbar region 06/16/2023   Spondylolisthesis at L5-S1 level 09/14/2020   Tobacco abuse 05/30/2013    Tobacco Use: Social History   Tobacco Use  Smoking Status Former   Current packs/day: 0.00   Average packs/day: 0.5 packs/day for 50.3 years (25.2 ttl pk-yrs)   Types: Cigarettes   Start date: 70   Quit date: 12/11/2023   Years since quitting: 0.4  Smokeless Tobacco Never  Tobacco Comments   Started smoking at 68 years old.   Smoked 1 PPD at his heaviest.   Quit smoking on 12/11/2023    Labs: Review Flowsheet  More data exists      Latest Ref Rng & Units 03/04/2016 03/24/2017 04/13/2018 04/19/2019 04/24/2020  Labs for ITP Cardiac and Pulmonary Rehab  Cholestrol <200 mg/dL 827  839  821  825  821   LDL (calc) mg/dL (calc) 83  83  77  72  83   HDL-C > OR = 40 mg/dL 22.39  37.49  08.59  07.69  78   Trlycerides <150 mg/dL 40.9  25.9  48.9  50.9  79     Capillary Blood Glucose: Lab Results  Component Value Date   GLUCAP 95 12/22/2023   GLUCAP 93 09/22/2022   GLUCAP 127 (H) 04/09/2008   GLUCAP 116 (H) 04/09/2008     Pulmonary Assessment Scores:  Pulmonary Assessment Scores     Row Name 04/26/24 1052         ADL UCSD   ADL Phase Entry     SOB Score total 88       CAT Score   CAT Score 32       mMRC Score   mMRC Score 3        UCSD: Self-administered rating of dyspnea associated with activities of daily living (ADLs) 6-point scale (0 = not at all to 5 = maximal or unable to do because of breathlessness)  Scoring Scores range from 0 to 120.  Minimally important difference is 5 units  CAT: CAT can identify the health impairment of COPD patients and is better correlated with disease progression.  CAT has a scoring range of zero to 40. The CAT score is classified into four groups  of low (less than 10), medium (10 - 20), high (21-30) and very high (31-40) based on the impact level of disease on health status. A CAT score over 10 suggests significant symptoms.  A worsening CAT score could be explained by an exacerbation, poor medication adherence, poor inhaler technique, or progression of COPD or comorbid conditions.  CAT MCID is 2 points  mMRC: mMRC (Modified Medical Research Council) Dyspnea Scale is used to assess the degree of baseline functional disability in patients of respiratory disease due to dyspnea. No minimal important difference is established. A decrease in score of 1 point or greater is considered a positive change.   Pulmonary Function Assessment:  Pulmonary Function Assessment - 04/26/24 1105       Breath   Bilateral Breath Sounds Decreased;Clear    Shortness of Breath Yes;Limiting activity;Fear of Shortness of Breath          Exercise Target Goals: Exercise Program Goal: Individual exercise prescription set using results from initial 6 min walk test and THRR while considering  patient's activity barriers and safety.   Exercise Prescription Goal: Initial exercise prescription builds to 30-45 minutes a day of aerobic activity, 2-3 days per week.  Home exercise guidelines will be given to patient during program as part of exercise prescription that the participant will acknowledge.  Activity Barriers & Risk Stratification:  Activity Barriers & Cardiac Risk Stratification - 04/26/24 1104        Activity Barriers & Cardiac Risk Stratification   Activity Barriers Deconditioning;Muscular Weakness;Shortness of Breath;Right Hip Replacement;Back Problems    Cardiac Risk Stratification Moderate          6 Minute Walk:  6 Minute Walk     Row Name 04/26/24 1211         6 Minute Walk   Phase Initial     Distance 661 feet     Walk Time 6 minutes     # of Rest Breaks 1  3:55-4:12     MPH 1.25     METS 2.07     RPE 12     Perceived Dyspnea  1     VO2 Peak 7.24     Symptoms Yes (comment)     Comments hip and back pain     Resting HR 74 bpm     Resting BP 118/60     Resting Oxygen  Saturation  90 %     Exercise Oxygen  Saturation  during 6 min walk 86 %     Max Ex. HR 76 bpm     Max Ex. BP 122/64     2 Minute Post BP 112/60       Interval HR   1 Minute HR 72     2 Minute HR 71     3 Minute HR 75     4 Minute HR 71     5 Minute HR 76     6 Minute HR 76     2 Minute Post HR 60     Interval Heart Rate? Yes       Interval Oxygen    Interval Oxygen ? Yes     Baseline Oxygen  Saturation % 90 %     1 Minute Oxygen  Saturation % 90 %     1 Minute Liters of Oxygen  4 L     2 Minute Oxygen  Saturation % 86 %  O2 increased to 6L     2 Minute Liters of Oxygen  4 L  increased to 6L  3 Minute Oxygen  Saturation % 90 %     3 Minute Liters of Oxygen  6 L     4 Minute Oxygen  Saturation % 91 %     4 Minute Liters of Oxygen  6 L     5 Minute Oxygen  Saturation % 90 %     5 Minute Liters of Oxygen  6 L     6 Minute Oxygen  Saturation % 94 %     6 Minute Liters of Oxygen  6 L     2 Minute Post Oxygen  Saturation % 97 %     2 Minute Post Liters of Oxygen  6 L        Oxygen  Initial Assessment:  Oxygen  Initial Assessment - 04/26/24 1048       Home Oxygen    Home Oxygen  Device Home Concentrator;E-Tanks    Sleep Oxygen  Prescription Continuous    Liters per minute 4    Home Exercise Oxygen  Prescription Continuous    Liters per minute 4    Home Resting Oxygen  Prescription Continuous     Liters per minute 4      Initial 6 min Walk   Oxygen  Used Continuous    Liters per minute 6      Program Oxygen  Prescription   Program Oxygen  Prescription Continuous    Liters per minute 6      Intervention   Short Term Goals To learn and exhibit compliance with exercise, home and travel O2 prescription;To learn and understand importance of monitoring SPO2 with pulse oximeter and demonstrate accurate use of the pulse oximeter.;To learn and understand importance of maintaining oxygen  saturations>88%;To learn and demonstrate proper pursed lip breathing techniques or other breathing techniques. ;To learn and demonstrate proper use of respiratory medications    Long  Term Goals Exhibits compliance with exercise, home  and travel O2 prescription;Maintenance of O2 saturations>88%;Compliance with respiratory medication;Verbalizes importance of monitoring SPO2 with pulse oximeter and return demonstration;Exhibits proper breathing techniques, such as pursed lip breathing or other method taught during program session;Demonstrates proper use of MDI's          Oxygen  Re-Evaluation:  Oxygen  Re-Evaluation     Row Name 04/29/24 0951             Program Oxygen  Prescription   Program Oxygen  Prescription Continuous       Liters per minute 6         Home Oxygen    Home Oxygen  Device Home Concentrator;E-Tanks       Sleep Oxygen  Prescription Continuous       Liters per minute 4       Home Exercise Oxygen  Prescription Continuous       Liters per minute 4       Home Resting Oxygen  Prescription Continuous       Liters per minute 4         Goals/Expected Outcomes   Short Term Goals To learn and exhibit compliance with exercise, home and travel O2 prescription;To learn and understand importance of monitoring SPO2 with pulse oximeter and demonstrate accurate use of the pulse oximeter.;To learn and understand importance of maintaining oxygen  saturations>88%;To learn and demonstrate proper pursed lip  breathing techniques or other breathing techniques. ;To learn and demonstrate proper use of respiratory medications       Long  Term Goals Exhibits compliance with exercise, home  and travel O2 prescription;Maintenance of O2 saturations>88%;Compliance with respiratory medication;Verbalizes importance of monitoring SPO2 with pulse oximeter and return demonstration;Exhibits proper breathing techniques, such as pursed lip breathing  or other method taught during program session;Demonstrates proper use of MDI's       Goals/Expected Outcomes Compliance and understanding of oxygen  saturation monitoring and breathing techniques to decrease shortness of breath          Oxygen  Discharge (Final Oxygen  Re-Evaluation):  Oxygen  Re-Evaluation - 04/29/24 0951       Program Oxygen  Prescription   Program Oxygen  Prescription Continuous    Liters per minute 6      Home Oxygen    Home Oxygen  Device Home Concentrator;E-Tanks    Sleep Oxygen  Prescription Continuous    Liters per minute 4    Home Exercise Oxygen  Prescription Continuous    Liters per minute 4    Home Resting Oxygen  Prescription Continuous    Liters per minute 4      Goals/Expected Outcomes   Short Term Goals To learn and exhibit compliance with exercise, home and travel O2 prescription;To learn and understand importance of monitoring SPO2 with pulse oximeter and demonstrate accurate use of the pulse oximeter.;To learn and understand importance of maintaining oxygen  saturations>88%;To learn and demonstrate proper pursed lip breathing techniques or other breathing techniques. ;To learn and demonstrate proper use of respiratory medications    Long  Term Goals Exhibits compliance with exercise, home  and travel O2 prescription;Maintenance of O2 saturations>88%;Compliance with respiratory medication;Verbalizes importance of monitoring SPO2 with pulse oximeter and return demonstration;Exhibits proper breathing techniques, such as pursed lip breathing or  other method taught during program session;Demonstrates proper use of MDI's    Goals/Expected Outcomes Compliance and understanding of oxygen  saturation monitoring and breathing techniques to decrease shortness of breath          Initial Exercise Prescription:  Initial Exercise Prescription - 04/26/24 1200       Date of Initial Exercise RX and Referring Provider   Date 04/26/24    Referring Provider [name]    Expected Discharge Date 07/23/24      Oxygen    Oxygen  Continuous    Liters 6    Maintain Oxygen  Saturation 88% or higher      NuStep   Level 1    SPM 54    Minutes 30    METs 1.4      Prescription Details   Frequency (times per week) 2    Duration Progress to 30 minutes of continuous aerobic without signs/symptoms of physical distress      Intensity   THRR 40-80% of Max Heartrate 60-121    Ratings of Perceived Exertion 11-13    Perceived Dyspnea 0-4      Progression   Progression Continue to progress workloads to maintain intensity without signs/symptoms of physical distress.      Resistance Training   Training Prescription Yes    Weight red bands    Reps 10-15          Perform Capillary Blood Glucose checks as needed.  Exercise Prescription Changes:   Exercise Prescription Changes     Row Name 05/07/24 0900             Response to Exercise   Blood Pressure (Admit) 112/58       Blood Pressure (Exercise) 92/52       Blood Pressure (Exit) 110/62       Heart Rate (Admit) 50 bpm       Heart Rate (Exercise) 57 bpm       Heart Rate (Exit) 61 bpm       Oxygen  Saturation (Admit) 99 %  Oxygen  Saturation (Exercise) 97 %       Oxygen  Saturation (Exit) 100 %       Rating of Perceived Exertion (Exercise) 14       Perceived Dyspnea (Exercise) 1       Symptoms Dizziness       Duration Progress to 30 minutes of  aerobic without signs/symptoms of physical distress       Intensity THRR unchanged         Progression   Progression Continue to  progress workloads to maintain intensity without signs/symptoms of physical distress.         Resistance Training   Training Prescription Yes       Weight red bands       Reps 10-15       Time 10 Minutes         Oxygen    Oxygen  Continuous       Liters 4         NuStep   Level 1       SPM 54       Minutes 30       METs 1.7         Oxygen    Maintain Oxygen  Saturation 88% or higher          Exercise Comments:   Exercise Goals and Review:   Exercise Goals     Row Name 04/26/24 1048             Exercise Goals   Increase Physical Activity Yes       Intervention Provide advice, education, support and counseling about physical activity/exercise needs.;Develop an individualized exercise prescription for aerobic and resistive training based on initial evaluation findings, risk stratification, comorbidities and participant's personal goals.       Expected Outcomes Short Term: Attend rehab on a regular basis to increase amount of physical activity.;Long Term: Add in home exercise to make exercise part of routine and to increase amount of physical activity.;Long Term: Exercising regularly at least 3-5 days a week.       Increase Strength and Stamina Yes       Intervention Provide advice, education, support and counseling about physical activity/exercise needs.;Develop an individualized exercise prescription for aerobic and resistive training based on initial evaluation findings, risk stratification, comorbidities and participant's personal goals.       Expected Outcomes Short Term: Increase workloads from initial exercise prescription for resistance, speed, and METs.;Short Term: Perform resistance training exercises routinely during rehab and add in resistance training at home;Long Term: Improve cardiorespiratory fitness, muscular endurance and strength as measured by increased METs and functional capacity ( )       Able to understand and use rate of perceived exertion (RPE) scale  Yes       Intervention Provide education and explanation on how to use RPE scale       Expected Outcomes Short Term: Able to use RPE daily in rehab to express subjective intensity level;Long Term:  Able to use RPE to guide intensity level when exercising independently       Able to understand and use Dyspnea scale Yes       Intervention Provide education and explanation on how to use Dyspnea scale       Expected Outcomes Short Term: Able to use Dyspnea scale daily in rehab to express subjective sense of shortness of breath during exertion;Long Term: Able to use Dyspnea scale to guide intensity level when exercising independently  Knowledge and understanding of Target Heart Rate Range (THRR) Yes       Intervention Provide education and explanation of THRR including how the numbers were predicted and where they are located for reference       Expected Outcomes Short Term: Able to state/look up THRR;Long Term: Able to use THRR to govern intensity when exercising independently;Short Term: Able to use daily as guideline for intensity in rehab       Understanding of Exercise Prescription Yes       Intervention Provide education, explanation, and written materials on patient's individual exercise prescription       Expected Outcomes Short Term: Able to explain program exercise prescription;Long Term: Able to explain home exercise prescription to exercise independently          Exercise Goals Re-Evaluation :  Exercise Goals Re-Evaluation     Row Name 04/29/24 0950             Exercise Goal Re-Evaluation   Exercise Goals Review Increase Physical Activity;Understanding of Exercise Prescription;Able to understand and use Dyspnea scale;Increase Strength and Stamina;Knowledge and understanding of Target Heart Rate Range (THRR);Able to understand and use rate of perceived exertion (RPE) scale       Comments Hezzie is scheduled to start exercise on 9/25. Will continue to monitor and progress as able.        Expected Outcomes Through exercise at rehab and home, the patient will decrease shortness of breath with daily activities and feel confident in carrying out an exercise regimen at home.          Discharge Exercise Prescription (Final Exercise Prescription Changes):  Exercise Prescription Changes - 05/07/24 0900       Response to Exercise   Blood Pressure (Admit) 112/58    Blood Pressure (Exercise) 92/52    Blood Pressure (Exit) 110/62    Heart Rate (Admit) 50 bpm    Heart Rate (Exercise) 57 bpm    Heart Rate (Exit) 61 bpm    Oxygen  Saturation (Admit) 99 %    Oxygen  Saturation (Exercise) 97 %    Oxygen  Saturation (Exit) 100 %    Rating of Perceived Exertion (Exercise) 14    Perceived Dyspnea (Exercise) 1    Symptoms Dizziness    Duration Progress to 30 minutes of  aerobic without signs/symptoms of physical distress    Intensity THRR unchanged      Progression   Progression Continue to progress workloads to maintain intensity without signs/symptoms of physical distress.      Resistance Training   Training Prescription Yes    Weight red bands    Reps 10-15    Time 10 Minutes      Oxygen    Oxygen  Continuous    Liters 4      NuStep   Level 1    SPM 54    Minutes 30    METs 1.7      Oxygen    Maintain Oxygen  Saturation 88% or higher          Nutrition:  Target Goals: Understanding of nutrition guidelines, daily intake of sodium 1500mg , cholesterol 200mg , calories 30% from fat and 7% or less from saturated fats, daily to have 5 or more servings of fruits and vegetables.  Biometrics:    Nutrition Therapy Plan and Nutrition Goals:   Nutrition Assessments:  MEDIFICTS Score Key: >=70 Need to make dietary changes  40-70 Heart Healthy Diet <= 40 Therapeutic Level Cholesterol Diet   Picture Your Plate Scores: <  40 Unhealthy dietary pattern with much room for improvement. 41-50 Dietary pattern unlikely to meet recommendations for good health and room for  improvement. 51-60 More healthful dietary pattern, with some room for improvement.  >60 Healthy dietary pattern, although there may be some specific behaviors that could be improved.    Nutrition Goals Re-Evaluation:   Nutrition Goals Discharge (Final Nutrition Goals Re-Evaluation):   Psychosocial: Target Goals: Acknowledge presence or absence of significant depression and/or stress, maximize coping skills, provide positive support system. Participant is able to verbalize types and ability to use techniques and skills needed for reducing stress and depression.  Initial Review & Psychosocial Screening:  Initial Psych Review & Screening - 04/26/24 1059       Initial Review   Current issues with None Identified      Family Dynamics   Good Support System? Yes      Barriers   Psychosocial barriers to participate in program There are no identifiable barriers or psychosocial needs.      Screening Interventions   Interventions Encouraged to exercise          Quality of Life Scores:  Scores of 19 and below usually indicate a poorer quality of life in these areas.  A difference of  2-3 points is a clinically meaningful difference.  A difference of 2-3 points in the total score of the Quality of Life Index has been associated with significant improvement in overall quality of life, self-image, physical symptoms, and general health in studies assessing change in quality of life.  PHQ-9: Review Flowsheet  More data exists      04/26/2024 01/16/2024 02/16/2023 11/01/2021 04/26/2021  Depression screen PHQ 2/9  Decreased Interest 0 0 1 0 0  Down, Depressed, Hopeless 0 0 0 0 0  PHQ - 2 Score 0 0 1 0 0  Altered sleeping 0 - - - -  Tired, decreased energy 0 - - - -  Change in appetite 0 - - - -  Feeling bad or failure about yourself  0 - - - -  Trouble concentrating 0 - - - -  Moving slowly or fidgety/restless 0 - - - -  Suicidal thoughts 0 - - - -  PHQ-9 Score 0 - - - -  Difficult  doing work/chores Not difficult at all - - - -   Interpretation of Total Score  Total Score Depression Severity:  1-4 = Minimal depression, 5-9 = Mild depression, 10-14 = Moderate depression, 15-19 = Moderately severe depression, 20-27 = Severe depression   Psychosocial Evaluation and Intervention:  Psychosocial Evaluation - 04/26/24 1155       Psychosocial Evaluation & Interventions   Interventions Encouraged to exercise with the program and follow exercise prescription    Comments Octaviano denies any psychosocial barriers or concerns at this time    Expected Outcomes For Octaviano to participate in PR free of any psychosocial barriers or concerns    Continue Psychosocial Services  No Follow up required          Psychosocial Re-Evaluation:  Psychosocial Re-Evaluation     Row Name 04/29/24 1438             Psychosocial Re-Evaluation   Current issues with None Identified       Comments Jelani is scheduled to start PR on 05/02/24. No new psychosocial barriers or concerns at this time.       Expected Outcomes For Dacota to participate in PR free of any psychosocial barriers or concerns  Interventions Encouraged to attend Pulmonary Rehabilitation for the exercise       Continue Psychosocial Services  No Follow up required          Psychosocial Discharge (Final Psychosocial Re-Evaluation):  Psychosocial Re-Evaluation - 04/29/24 1438       Psychosocial Re-Evaluation   Current issues with None Identified    Comments Reynoldo is scheduled to start PR on 05/02/24. No new psychosocial barriers or concerns at this time.    Expected Outcomes For Marquail to participate in PR free of any psychosocial barriers or concerns    Interventions Encouraged to attend Pulmonary Rehabilitation for the exercise    Continue Psychosocial Services  No Follow up required          Education: Education Goals: Education classes will be provided on a weekly basis, covering required topics. Participant will  state understanding/return demonstration of topics presented.  Learning Barriers/Preferences:  Learning Barriers/Preferences - 04/26/24 1101       Learning Barriers/Preferences   Learning Barriers Sight    Learning Preferences None          Education Topics: Know Your Numbers Group instruction that is supported by a PowerPoint presentation. Instructor discusses importance of knowing and understanding resting, exercise, and post-exercise oxygen  saturation, heart rate, and blood pressure. Oxygen  saturation, heart rate, blood pressure, rating of perceived exertion, and dyspnea are reviewed along with a normal range for these values.    Exercise for the Pulmonary Patient Group instruction that is supported by a PowerPoint presentation. Instructor discusses benefits of exercise, core components of exercise, frequency, duration, and intensity of an exercise routine, importance of utilizing pulse oximetry during exercise, safety while exercising, and options of places to exercise outside of rehab.    MET Level  Group instruction provided by PowerPoint, verbal discussion, and written material to support subject matter. Instructor reviews what METs are and how to increase METs.    Pulmonary Medications Verbally interactive group education provided by instructor with focus on inhaled medications and proper administration.   Anatomy and Physiology of the Respiratory System Group instruction provided by PowerPoint, verbal discussion, and written material to support subject matter. Instructor reviews respiratory cycle and anatomical components of the respiratory system and their functions. Instructor also reviews differences in obstructive and restrictive respiratory diseases with examples of each.    Oxygen  Safety Group instruction provided by PowerPoint, verbal discussion, and written material to support subject matter. There is an overview of "What is Oxygen " and "Why do we need it".   Instructor also reviews how to create a safe environment for oxygen  use, the importance of using oxygen  as prescribed, and the risks of noncompliance. There is a brief discussion on traveling with oxygen  and resources the patient may utilize. Flowsheet Row PULMONARY REHAB OTHER RESPIRATORY from 05/02/2024 in [facility] for Heart, Vascular, & Lung Health  Date 05/02/24  Educator RN  Instruction Review Code 1- Verbalizes Understanding    Oxygen  Use Group instruction provided by PowerPoint, verbal discussion, and written material to discuss how supplemental oxygen  is prescribed and different types of oxygen  supply systems. Resources for more information are provided.    Breathing Techniques Group instruction that is supported by demonstration and informational handouts. Instructor discusses the benefits of pursed lip and diaphragmatic breathing and detailed demonstration on how to perform both.     Risk Factor Reduction Group instruction that is supported by a PowerPoint presentation. Instructor discusses the definition of a risk factor, different risk factors  for pulmonary disease, and how the heart and lungs work together.   Pulmonary Diseases Group instruction provided by PowerPoint, verbal discussion, and written material to support subject matter. Instructor gives an overview of the different type of pulmonary diseases. There is also a discussion on risk factors and symptoms as well as ways to manage the diseases.   Stress and Energy Conservation Group instruction provided by PowerPoint, verbal discussion, and written material to support subject matter. Instructor gives an overview of stress and the impact it can have on the body. Instructor also reviews ways to reduce stress. There is also a discussion on energy conservation and ways to conserve energy throughout the day.   Warning Signs and Symptoms Group instruction provided by PowerPoint, verbal discussion,  and written material to support subject matter. Instructor reviews warning signs and symptoms of stroke, heart attack, cold and flu. Instructor also reviews ways to prevent the spread of infection.   Other Education Group or individual verbal, written, or video instructions that support the educational goals of the pulmonary rehab program.    Knowledge Questionnaire Score:  Knowledge Questionnaire Score - 04/26/24 1050       Knowledge Questionnaire Score   Pre Score 15/18          Core Components/Risk Factors/Patient Goals at Admission:  Personal Goals and Risk Factors at Admission - 04/26/24 1102       Core Components/Risk Factors/Patient Goals on Admission   Tobacco Cessation Yes    Number of packs per day vapes every now and then    Intervention Assist the participant in steps to quit. Provide individualized education and counseling about committing to Tobacco Cessation, relapse prevention, and pharmacological support that can be provided by physician.;Education officer, environmental, assist with locating and accessing local/national Quit Smoking programs, and support quit date choice.    Expected Outcomes Short Term: Will demonstrate readiness to quit, by selecting a quit date.;Short Term: Will quit all tobacco product use, adhering to prevention of relapse plan.;Long Term: Complete abstinence from all tobacco products for at least 12 months from quit date.    Improve shortness of breath with ADL's Yes    Intervention Provide education, individualized exercise plan and daily activity instruction to help decrease symptoms of SOB with activities of daily living.    Expected Outcomes Short Term: Improve cardiorespiratory fitness to achieve a reduction of symptoms when performing ADLs;Long Term: Be able to perform more ADLs without symptoms or delay the onset of symptoms          Core Components/Risk Factors/Patient Goals Review:   Goals and Risk Factor Review     Row Name  04/29/24 1440             Core Components/Risk Factors/Patient Goals Review   Personal Goals Review Tobacco Cessation;Improve shortness of breath with ADL's;Develop more efficient breathing techniques such as purse lipped breathing and diaphragmatic breathing and practicing self-pacing with activity.       Review Demichael is scheduled to start PR on 05/02/24. Unable to assess his goals at this time.       Expected Outcomes To improve shortness of breath with ADL's, develop more efficient breathing techniques such as purse lipped breathing and diaphragmatic breathing; and practicing self-pacing with activity and stop vaping.          Core Components/Risk Factors/Patient Goals at Discharge (Final Review):   Goals and Risk Factor Review - 04/29/24 1440       Core Components/Risk Factors/Patient Goals Review  Personal Goals Review Tobacco Cessation;Improve shortness of breath with ADL's;Develop more efficient breathing techniques such as purse lipped breathing and diaphragmatic breathing and practicing self-pacing with activity.    Review Selim is scheduled to start PR on 05/02/24. Unable to assess his goals at this time.    Expected Outcomes To improve shortness of breath with ADL's, develop more efficient breathing techniques such as purse lipped breathing and diaphragmatic breathing; and practicing self-pacing with activity and stop vaping.          ITP Comments: Pt is making expected progress toward Pulmonary Rehab goals after completing 2 session(s). Recommend continued exercise, life style modification, education, and utilization of breathing techniques to increase stamina and strength, while also decreasing shortness of breath with exertion.  Dr. Slater Staff is Medical Director for Pulmonary Rehab at [facility].

## 2024-05-09 ENCOUNTER — Telehealth: Payer: Self-pay | Admitting: *Deleted

## 2024-05-09 ENCOUNTER — Encounter (HOSPITAL_COMMUNITY)
Admission: RE | Admit: 2024-05-09 | Discharge: 2024-05-09 | Disposition: A | Discharge status: Home / Self Care | Source: Ambulatory Visit | Attending: Student in an Organized Health Care Education/Training Program | Admitting: Student in an Organized Health Care Education/Training Program

## 2024-05-09 DIAGNOSIS — I5042 Chronic combined systolic (congestive) and diastolic (congestive) heart failure: Secondary | ICD-10-CM | POA: Diagnosis not present

## 2024-05-09 DIAGNOSIS — C3432 Malignant neoplasm of lower lobe, left bronchus or lung: Secondary | ICD-10-CM | POA: Diagnosis present

## 2024-05-09 NOTE — Telephone Encounter (Signed)
 Call to patient to review instructions, no answer.

## 2024-05-09 NOTE — Telephone Encounter (Signed)
 Reviewed procedure instructions with patient.

## 2024-05-09 NOTE — Telephone Encounter (Signed)
 Cardiac Catheterization scheduled at [facility] for: Friday May 10, 2024 10:30 AM Arrival time [facility] Main Entrance A at: 8:30 AM  Diet: -Nothing to eat after midnight.  Hydration: -May drink clear liquids until 2 hours before the procedure.  Approved liquids: Water, clear tea, black coffee, fruit juices-non-citric and without pulp,Gatorade, plain Jello/popsicles.   -Please drink 8 oz of water 2 hours before procedure.   Medication instructions: -Hold:  Eliquis -none 05/08/24 until post procedure  Jardiance /Lasix -AM of procedure -Other usual morning medications can be taken including aspirin  81 mg.  Plan to go home the same day, you will only stay overnight if medically necessary.  You must have responsible adult to drive you home.  Someone must be with you the first 24 hours after you arrive home.  Left message for patient to call back to review procedure instructions

## 2024-05-09 NOTE — Progress Notes (Signed)
 Daily Session Note  Patient Details  Name: Brett [name] MRN: [id number] Date of Birth: 1956-03-23 Referring Provider:   [name] Pulmonary Rehab Walk Test from 04/26/2024 in [facility] for Heart, Vascular, & Lung Health  Referring Provider [name]    Encounter Date: 05/09/2024  Check In:  Session Check In - 05/09/24 1031       Check-In   Supervising physician immediately available to respond to emergencies [facility] MD immediately available    Physician(s) Jackee Alberts, NP    Location [facility]    Staff Present [name], Neita Moats, MS, ACSM-CEP, Exercise Physiologist;Mary Harvy, RN, BSN;Jetta Walker BS, ACSM-CEP, Exercise Physiologist    Virtual Visit No    Medication changes reported     No    Fall or balance concerns reported    No    Tobacco Cessation No Change    Warm-up and Cool-down Performed as group-led instruction    Resistance Training Performed Yes    VAD Patient? No    PAD/SET Patient? No      Pain Assessment   Currently in Pain? No/denies    Pain Score 0-No pain    Multiple Pain Sites No          Capillary Blood Glucose: No results found for this or any previous visit (from the past 24 hours).    Social History   Tobacco Use  Smoking Status Former   Current packs/day: 0.00   Average packs/day: 0.5 packs/day for 50.3 years (25.2 ttl pk-yrs)   Types: Cigarettes   Start date: 1   Quit date: 12/11/2023   Years since quitting: 0.4  Smokeless Tobacco Never  Tobacco Comments   Started smoking at 68 years old.   Smoked 1 PPD at his heaviest.   Quit smoking on 12/11/2023    Goals Met:  Proper associated with RPD/PD & O2 Sat Exercise tolerated well No report of concerns or symptoms today Strength training completed today  Goals Unmet:  Not Applicable  Comments: Service time is from 1016 to 1140.    Dr. Slater Staff is Medical Director for Pulmonary Rehab at [facility].

## 2024-05-10 ENCOUNTER — Ambulatory Visit (HOSPITAL_COMMUNITY)
Admission: RE | Disposition: A | Discharge status: Home / Self Care | Payer: Self-pay | Source: Home / Self Care | Attending: Cardiovascular Disease

## 2024-05-10 ENCOUNTER — Ambulatory Visit (HOSPITAL_COMMUNITY)
Admission: RE | Admit: 2024-05-10 | Discharge: 2024-05-10 | Disposition: A | Discharge status: Home / Self Care | Attending: Cardiovascular Disease | Admitting: Cardiovascular Disease

## 2024-05-10 ENCOUNTER — Other Ambulatory Visit: Payer: Self-pay

## 2024-05-10 ENCOUNTER — Encounter: Payer: Self-pay | Admitting: Internal Medicine

## 2024-05-10 DIAGNOSIS — I480 Paroxysmal atrial fibrillation: Secondary | ICD-10-CM | POA: Insufficient documentation

## 2024-05-10 DIAGNOSIS — Z7901 Long term (current) use of anticoagulants: Secondary | ICD-10-CM | POA: Insufficient documentation

## 2024-05-10 DIAGNOSIS — I5020 Unspecified systolic (congestive) heart failure: Secondary | ICD-10-CM | POA: Diagnosis not present

## 2024-05-10 DIAGNOSIS — Z85118 Personal history of other malignant neoplasm of bronchus and lung: Secondary | ICD-10-CM | POA: Diagnosis not present

## 2024-05-10 DIAGNOSIS — R0902 Hypoxemia: Secondary | ICD-10-CM | POA: Diagnosis not present

## 2024-05-10 DIAGNOSIS — I2510 Atherosclerotic heart disease of native coronary artery without angina pectoris: Secondary | ICD-10-CM | POA: Insufficient documentation

## 2024-05-10 DIAGNOSIS — J439 Emphysema, unspecified: Secondary | ICD-10-CM | POA: Insufficient documentation

## 2024-05-10 DIAGNOSIS — H409 Unspecified glaucoma: Secondary | ICD-10-CM | POA: Diagnosis not present

## 2024-05-10 DIAGNOSIS — I472 Ventricular tachycardia, unspecified: Secondary | ICD-10-CM | POA: Diagnosis not present

## 2024-05-10 DIAGNOSIS — Z9981 Dependence on supplemental oxygen: Secondary | ICD-10-CM | POA: Diagnosis not present

## 2024-05-10 HISTORY — PX: RIGHT/LEFT HEART CATH AND CORONARY ANGIOGRAPHY: CATH118266

## 2024-05-10 LAB — POCT I-STAT EG7
Acid-Base Excess: 2 mmol/L (ref 0.0–2.0)
Bicarbonate: 27.4 mmol/L (ref 20.0–28.0)
Calcium, Ion: 1.2 mmol/L (ref 1.15–1.40)
HCT: 41 % (ref 39.0–52.0)
Hemoglobin: 13.9 g/dL (ref 13.0–17.0)
O2 Saturation: 68 %
Potassium: 4.2 mmol/L (ref 3.5–5.1)
Sodium: 137 mmol/L (ref 135–145)
TCO2: 29 mmol/L (ref 22–32)
pCO2, Ven: 46.1 mmHg (ref 44–60)
pH, Ven: 7.382 (ref 7.25–7.43)
pO2, Ven: 37 mmHg (ref 32–45)

## 2024-05-10 LAB — POCT I-STAT 7, (LYTES, BLD GAS, ICA,H+H)
Acid-Base Excess: 2 mmol/L (ref 0.0–2.0)
Bicarbonate: 26.6 mmol/L (ref 20.0–28.0)
Calcium, Ion: 1.24 mmol/L (ref 1.15–1.40)
HCT: 42 % (ref 39.0–52.0)
Hemoglobin: 14.3 g/dL (ref 13.0–17.0)
O2 Saturation: 94 %
Potassium: 4.3 mmol/L (ref 3.5–5.1)
Sodium: 136 mmol/L (ref 135–145)
TCO2: 28 mmol/L (ref 22–32)
pCO2 arterial: 42.1 mmHg (ref 32–48)
pH, Arterial: 7.41 (ref 7.35–7.45)
pO2, Arterial: 73 mmHg — ABNORMAL LOW (ref 83–108)

## 2024-05-10 SURGERY — RIGHT/LEFT HEART CATH AND CORONARY ANGIOGRAPHY
Anesthesia: LOCAL

## 2024-05-10 MED ORDER — VERAPAMIL HCL 2.5 MG/ML IV SOLN
INTRAVENOUS | Status: AC
  Filled 2024-05-10: qty 2

## 2024-05-10 MED ORDER — HYDRALAZINE HCL 20 MG/ML IJ SOLN: 10.0000 mg | INTRAMUSCULAR | Status: DC | PRN

## 2024-05-10 MED ORDER — VERAPAMIL HCL 2.5 MG/ML IV SOLN
INTRAVENOUS | Status: DC | PRN
  Administered 2024-05-10: 10 mL via INTRA_ARTERIAL

## 2024-05-10 MED ORDER — HEPARIN SODIUM (PORCINE) 1000 UNIT/ML IJ SOLN
INTRAMUSCULAR | Status: AC
  Filled 2024-05-10: qty 10

## 2024-05-10 MED ORDER — FENTANYL CITRATE (PF) 100 MCG/2ML IJ SOLN
INTRAMUSCULAR | Status: AC
  Filled 2024-05-10: qty 2

## 2024-05-10 MED ORDER — MIDAZOLAM HCL 2 MG/2ML IJ SOLN
INTRAMUSCULAR | Status: DC | PRN
  Administered 2024-05-10: 1 mg via INTRAVENOUS

## 2024-05-10 MED ORDER — FREE WATER: 250.0000 mL | Freq: Once | Status: DC

## 2024-05-10 MED ORDER — SODIUM CHLORIDE 0.9% FLUSH: 3.0000 mL | Freq: Two times a day (BID) | INTRAVENOUS | Status: DC

## 2024-05-10 MED ORDER — FREE WATER
500.0000 mL | Freq: Once | Status: DC
Start: 2024-05-10 — End: 2024-05-10

## 2024-05-10 MED ORDER — ACETAMINOPHEN 325 MG PO TABS: 650.0000 mg | ORAL_TABLET | ORAL | Status: DC | PRN

## 2024-05-10 MED ORDER — MIDAZOLAM HCL 2 MG/2ML IJ SOLN
INTRAMUSCULAR | Status: AC
  Filled 2024-05-10: qty 2

## 2024-05-10 MED ORDER — SODIUM CHLORIDE 0.9% FLUSH: 3.0000 mL | INTRAVENOUS | Status: DC | PRN

## 2024-05-10 MED ORDER — ONDANSETRON HCL 4 MG/2ML IJ SOLN: 4.0000 mg | Freq: Four times a day (QID) | INTRAMUSCULAR | Status: DC | PRN

## 2024-05-10 MED ORDER — IOHEXOL 350 MG/ML SOLN
INTRAVENOUS | Status: DC | PRN
  Administered 2024-05-10: 40 mL via INTRA_ARTERIAL

## 2024-05-10 MED ORDER — LIDOCAINE HCL (PF) 1 % IJ SOLN
INTRAMUSCULAR | Status: AC
  Filled 2024-05-10: qty 30

## 2024-05-10 MED ORDER — LABETALOL HCL 5 MG/ML IV SOLN: 10.0000 mg | INTRAVENOUS | Status: DC | PRN

## 2024-05-10 MED ORDER — SODIUM CHLORIDE 0.9 % IV SOLN: 250.0000 mL | INTRAVENOUS | Status: DC | PRN

## 2024-05-10 MED ORDER — FENTANYL CITRATE (PF) 100 MCG/2ML IJ SOLN
INTRAMUSCULAR | Status: DC | PRN
  Administered 2024-05-10: 25 ug via INTRAVENOUS

## 2024-05-10 MED ORDER — ASPIRIN 81 MG PO CHEW: 81.0000 mg | CHEWABLE_TABLET | ORAL | Status: DC

## 2024-05-10 MED ORDER — HEPARIN (PORCINE) IN NACL 1000-0.9 UT/500ML-% IV SOLN
INTRAVENOUS | Status: DC | PRN
  Administered 2024-05-10: 1000 mL via SURGICAL_CAVITY

## 2024-05-10 MED ORDER — HEPARIN SODIUM (PORCINE) 1000 UNIT/ML IJ SOLN
INTRAMUSCULAR | Status: DC | PRN
  Administered 2024-05-10: 4000 [IU] via INTRAVENOUS

## 2024-05-10 MED ORDER — SODIUM CHLORIDE 0.9 % IV SOLN
250.0000 mL | INTRAVENOUS | Status: DC | PRN
Start: 2024-05-10 — End: 2024-05-10

## 2024-05-10 SURGICAL SUPPLY — 9 items
CATH 5FR JL3.5 JR4 ANG PIG MP (CATHETERS) IMPLANT
CATH BALLN WEDGE 5F 110CM (CATHETERS) IMPLANT
DEVICE RAD COMP TR BAND LRG (VASCULAR PRODUCTS) IMPLANT
GLIDESHEATH SLEND SS 6F .021 (SHEATH) IMPLANT
GUIDEWIRE INQWIRE 1.5J.035X260 (WIRE) IMPLANT
KIT NAMIC PS PRESSURIZED FLUID (KITS) IMPLANT
PACK CARDIAC CATHETERIZATION (CUSTOM PROCEDURE TRAY) ×2 IMPLANT
SET ATX-X65L (MISCELLANEOUS) IMPLANT
SHEATH GLIDE SLENDER 4/5FR (SHEATH) IMPLANT

## 2024-05-10 NOTE — Discharge Instructions (Signed)

## 2024-05-10 NOTE — Progress Notes (Signed)
 Patient and daughter was given discharge instructions, Both verbalized understanding.

## 2024-05-10 NOTE — Interval H&P Note (Signed)
 History and Physical Interval Note:  05/10/2024 9:48 AM  Brett [name]  has presented today for surgery, with the diagnosis of cardiomyopathy - heart failure.  The various methods of treatment have been discussed with the patient and family. After consideration of risks, benefits and other options for treatment, the patient has consented to  Procedure(s): RIGHT/LEFT HEART CATH AND CORONARY ANGIOGRAPHY (N/A) as a surgical intervention.  The patient's history has been reviewed, patient examined, no change in status, stable for surgery.  I have reviewed the patient's chart and labs.  Questions were answered to the patient's satisfaction.     [name]

## 2024-05-12 ENCOUNTER — Encounter (HOSPITAL_COMMUNITY): Payer: Self-pay | Admitting: Cardiovascular Disease

## 2024-05-13 MED FILL — Lidocaine HCl Local Preservative Free (PF) Inj 1%: INTRAMUSCULAR | Qty: 30 | Status: AC

## 2024-05-14 ENCOUNTER — Encounter (HOSPITAL_COMMUNITY)
Admission: RE | Admit: 2024-05-14 | Discharge: 2024-05-14 | Disposition: A | Source: Ambulatory Visit | Attending: Student in an Organized Health Care Education/Training Program

## 2024-05-14 DIAGNOSIS — I5042 Chronic combined systolic (congestive) and diastolic (congestive) heart failure: Secondary | ICD-10-CM

## 2024-05-14 DIAGNOSIS — C3432 Malignant neoplasm of lower lobe, left bronchus or lung: Secondary | ICD-10-CM

## 2024-05-14 NOTE — Progress Notes (Signed)
 Daily Session Note  Patient Details  Name: Brett [name] MRN: [id number] Date of Birth: June 05, 1956 Referring Provider:   [name] Pulmonary Rehab Walk Test from 04/26/2024 in [facility] for Heart, Vascular, & Lung Health  Referring Provider [name]    Encounter Date: 05/14/2024  Check In:  Session Check In - 05/14/24 1031       Check-In   Supervising physician immediately available to respond to emergencies [facility] MD immediately available    Physician(s) Barnie Hila, NP    Location [facility]    Staff Present [name], Neita Moats, MS, ACSM-CEP, Exercise Physiologist;Mary Harvy, RN, Valere Music, RN, BSN    Virtual Visit No    Medication changes reported     No    Fall or balance concerns reported    No    Tobacco Cessation No Change    Warm-up and Cool-down Performed as group-led instruction    Resistance Training Performed Yes    VAD Patient? No    PAD/SET Patient? No      Pain Assessment   Currently in Pain? No/denies    Pain Score 0-No pain    Multiple Pain Sites No          Capillary Blood Glucose: No results found for this or any previous visit (from the past 24 hours).    Social History   Tobacco Use  Smoking Status Former   Current packs/day: 0.00   Average packs/day: 0.5 packs/day for 50.3 years (25.2 ttl pk-yrs)   Types: Cigarettes   Start date: 1   Quit date: 12/11/2023   Years since quitting: 0.4  Smokeless Tobacco Never  Tobacco Comments   Started smoking at 68 years old.   Smoked 1 PPD at his heaviest.   Quit smoking on 12/11/2023    Goals Met:  Proper associated with RPD/PD & O2 Sat Independence with exercise equipment Exercise tolerated well No report of concerns or symptoms today Strength training completed today  Goals Unmet:  Not Applicable  Comments: Service time is from 1010 to 1136.    Dr. Slater Staff is Medical Director for Pulmonary Rehab at [facility].

## 2024-05-16 ENCOUNTER — Encounter (HOSPITAL_COMMUNITY): Admission: RE | Admit: 2024-05-16 | Source: Ambulatory Visit

## 2024-05-16 ENCOUNTER — Telehealth (HOSPITAL_COMMUNITY): Payer: Self-pay

## 2024-05-16 NOTE — Telephone Encounter (Signed)
 Patient left message calling out for 10:15 PR class, states he's not feeling well.

## 2024-05-17 DIAGNOSIS — Z6821 Body mass index (BMI) 21.0-21.9, adult: Secondary | ICD-10-CM | POA: Diagnosis not present

## 2024-05-17 DIAGNOSIS — Z79899 Other long term (current) drug therapy: Secondary | ICD-10-CM | POA: Diagnosis not present

## 2024-05-17 DIAGNOSIS — R030 Elevated blood-pressure reading, without diagnosis of hypertension: Secondary | ICD-10-CM | POA: Diagnosis not present

## 2024-05-17 DIAGNOSIS — M5136 Other intervertebral disc degeneration, lumbar region with discogenic back pain only: Secondary | ICD-10-CM | POA: Diagnosis not present

## 2024-05-21 ENCOUNTER — Encounter (HOSPITAL_COMMUNITY)
Admission: RE | Admit: 2024-05-21 | Discharge: 2024-05-21 | Disposition: A | Discharge status: Home / Self Care | Source: Ambulatory Visit | Attending: Student in an Organized Health Care Education/Training Program | Admitting: Student in an Organized Health Care Education/Training Program

## 2024-05-21 VITALS — Wt 169.8 lb

## 2024-05-21 DIAGNOSIS — C3432 Malignant neoplasm of lower lobe, left bronchus or lung: Secondary | ICD-10-CM | POA: Diagnosis not present

## 2024-05-21 DIAGNOSIS — I5042 Chronic combined systolic (congestive) and diastolic (congestive) heart failure: Secondary | ICD-10-CM

## 2024-05-21 NOTE — Progress Notes (Signed)
 Daily Session Note  Patient Details  Name: Brett [name] MRN: [id number] Date of Birth: 07-Dec-1955 Referring Provider:   [name] Pulmonary Rehab Walk Test from 04/26/2024 in [facility] for Heart, Vascular, & Lung Health  Referring Provider [name]    Encounter Date: 05/21/2024  Check In:  Session Check In - 05/21/24 1122       Check-In   Supervising physician immediately available to respond to emergencies [facility] MD immediately available    Physician(s) Orren Fabry, PA    [facility]    Staff Present [name], Neita Moats, MS, ACSM-CEP, Exercise Physiologist;Mary Harvy, RN, Valere Music, RN, BSN;Johnny Porter, MS, Exercise Physiologist    Virtual Visit No    Medication changes reported     No    Fall or balance concerns reported    No    Tobacco Cessation No Change    Warm-up and Cool-down Performed as group-led instruction    Resistance Training Performed Yes    VAD Patient? No    PAD/SET Patient? No      Pain Assessment   Currently in Pain? No/denies    Pain Score 0-No pain    Multiple Pain Sites No          Capillary Blood Glucose: No results found for this or any previous visit (from the past 24 hours).   Exercise Prescription Changes - 05/21/24 1200       Response to Exercise   Blood Pressure (Admit) 114/52    Blood Pressure (Exercise) 132/70    Blood Pressure (Exit) 102/60    Heart Rate (Admit) 64 bpm    Heart Rate (Exercise) 67 bpm    Heart Rate (Exit) 73 bpm    Oxygen  Saturation (Admit) 97 %    Oxygen  Saturation (Exercise) 95 %    Oxygen  Saturation (Exit) 97 %    Rating of Perceived Exertion (Exercise) 13    Perceived Dyspnea (Exercise) 2.5    Duration Continue with 30 min of aerobic exercise without signs/symptoms of physical distress.    Intensity THRR unchanged      Progression   Progression Continue to progress workloads to maintain intensity without signs/symptoms of  physical distress.      Resistance Training   Training Prescription Yes    Weight red bands    Reps 10-15    Time 10 Minutes      Oxygen    Oxygen  Continuous    Liters 4      NuStep   Level 2    SPM 50    Minutes 30    METs 1.7          Social History   Tobacco Use  Smoking Status Former   Current packs/day: 0.00   Average packs/day: 0.5 packs/day for 50.3 years (25.2 ttl pk-yrs)   Types: Cigarettes   Start date: 23   Quit date: 12/11/2023   Years since quitting: 0.4  Smokeless Tobacco Never  Tobacco Comments   Started smoking at 68 years old.   Smoked 1 PPD at his heaviest.   Quit smoking on 12/11/2023    Goals Met:  Exercise tolerated well No report of concerns or symptoms today Strength training completed today  Goals Unmet:  Not Applicable  Comments: Service time is from 1019 to 1133.    Dr. Slater Staff is Medical Director for Pulmonary Rehab at [facility].

## 2024-05-23 ENCOUNTER — Encounter (HOSPITAL_COMMUNITY)
Admission: RE | Admit: 2024-05-23 | Discharge: 2024-05-23 | Disposition: A | Discharge status: Home / Self Care | Source: Ambulatory Visit | Attending: Student in an Organized Health Care Education/Training Program | Admitting: Student in an Organized Health Care Education/Training Program

## 2024-05-23 DIAGNOSIS — I5042 Chronic combined systolic (congestive) and diastolic (congestive) heart failure: Secondary | ICD-10-CM

## 2024-05-23 DIAGNOSIS — C3432 Malignant neoplasm of lower lobe, left bronchus or lung: Secondary | ICD-10-CM | POA: Diagnosis not present

## 2024-05-23 NOTE — Progress Notes (Signed)
 Daily Session Note  Patient Details  Name: Brett [name] MRN: [id number] Date of Birth: July 05, 1956 Referring Provider:   [name] Pulmonary Rehab Walk Test from 04/26/2024 in [facility] for Heart, Vascular, & Lung Health  Referring Provider [name]    Encounter Date: 05/23/2024  Check In:  Session Check In - 05/23/24 1033       Check-In   Supervising physician immediately available to respond to emergencies [facility] MD immediately available    Physician(s) [name], NP    [facility]    Staff Present [name], Neita Moats, MS, ACSM-CEP, Exercise Physiologist;Mary Harvy, RN, BSN;Lucyann Romano BS, ACSM-CEP, Exercise Physiologist    Virtual Visit No    Medication changes reported     No    Fall or balance concerns reported    No    Tobacco Cessation No Change    Warm-up and Cool-down Performed as group-led instruction    Resistance Training Performed Yes    VAD Patient? No    PAD/SET Patient? No      Pain Assessment   Currently in Pain? No/denies          Capillary Blood Glucose: No results found for this or any previous visit (from the past 24 hours).    Social History   Tobacco Use  Smoking Status Former   Current packs/day: 0.00   Average packs/day: 0.5 packs/day for 50.3 years (25.2 ttl pk-yrs)   Types: Cigarettes   Start date: 63   Quit date: 12/11/2023   Years since quitting: 0.4  Smokeless Tobacco Never  Tobacco Comments   Started smoking at 68 years old.   Smoked 1 PPD at his heaviest.   Quit smoking on 12/11/2023    Goals Met:  Exercise tolerated well No report of concerns or symptoms today Strength training completed today  Goals Unmet:  Not Applicable  Comments: Service time is from 1012 to 1140.    Dr. Slater Staff is Medical Director for Pulmonary Rehab at [facility].

## 2024-05-28 ENCOUNTER — Encounter (HOSPITAL_COMMUNITY)
Admission: RE | Admit: 2024-05-28 | Discharge: 2024-05-28 | Disposition: A | Discharge status: Home / Self Care | Source: Ambulatory Visit | Attending: Student in an Organized Health Care Education/Training Program | Admitting: Student in an Organized Health Care Education/Training Program

## 2024-05-28 DIAGNOSIS — C3432 Malignant neoplasm of lower lobe, left bronchus or lung: Secondary | ICD-10-CM

## 2024-05-28 DIAGNOSIS — I5042 Chronic combined systolic (congestive) and diastolic (congestive) heart failure: Secondary | ICD-10-CM

## 2024-05-28 NOTE — Progress Notes (Signed)
 Daily Session Note  Patient Details  Name: Brett [name] MRN: [id number] Date of Birth: [birth year] Referring Provider:   [name] Pulmonary Rehab Walk Test from 04/26/2024 in [facility] for Heart, Vascular, & Lung Health  Referring Provider [name]    Encounter Date: 05/28/2024  Check In:  Session Check In - 05/28/24 1152       Check-In   Supervising physician immediately available to respond to emergencies [facility] MD immediately available    Physician(s) [name], NP    [facility]    Staff Present [name], [name], RN, BSN;[name], ACSM-CEP, Exercise Physiologist;[name], MS, ACSM-CEP, Exercise Physiologist    Virtual Visit No    Medication changes reported     No    Fall or balance concerns reported    No    Tobacco Cessation No Change    Warm-up and Cool-down Performed as group-led instruction    Resistance Training Performed Yes    VAD Patient? No    PAD/SET Patient? No      Pain Assessment   Currently in Pain? No/denies    Multiple Pain Sites No          Capillary Blood Glucose: No results found for this or any previous visit (from the past 24 hours).    Social History   Tobacco Use  Smoking Status Former   Current packs/day: 0.00   Average packs/day: 0.5 packs/day for 50.3 years (25.2 ttl pk-yrs)   Types: Cigarettes   Start date: 30   Quit date: 12/11/2023   Years since quitting: 0.4  Smokeless Tobacco Never  Tobacco Comments   Started smoking at 68 years old.   Smoked 1 PPD at his heaviest.   Quit smoking on 12/11/2023    Goals Met:  Exercise tolerated well No report of concerns or symptoms today Strength training completed today  Goals Unmet:  Not Applicable  Comments: Service time is from 1015 to 1141.    Dr. Slater Staff is Medical Director for Pulmonary Rehab at [facility].

## 2024-05-30 ENCOUNTER — Other Ambulatory Visit: Payer: Self-pay | Admitting: Thoracic Surgery (Cardiothoracic Vascular Surgery)

## 2024-05-30 ENCOUNTER — Encounter (HOSPITAL_COMMUNITY)
Admission: RE | Admit: 2024-05-30 | Discharge: 2024-05-30 | Disposition: A | Source: Ambulatory Visit | Attending: Student in an Organized Health Care Education/Training Program

## 2024-05-30 DIAGNOSIS — I5042 Chronic combined systolic (congestive) and diastolic (congestive) heart failure: Secondary | ICD-10-CM

## 2024-05-30 DIAGNOSIS — R911 Solitary pulmonary nodule: Secondary | ICD-10-CM

## 2024-05-30 DIAGNOSIS — C3432 Malignant neoplasm of lower lobe, left bronchus or lung: Secondary | ICD-10-CM

## 2024-05-30 NOTE — Progress Notes (Signed)
 Daily Session Note  Patient Details  Name: Brett [name] MRN: [id number] Date of Birth: [birth year] Referring Provider:   [name] Pulmonary Rehab Walk Test from 04/26/2024 in [facility] for Heart, Vascular, & Lung Health  Referring Provider [name]    Encounter Date: 05/30/2024  Check In:  Session Check In - 05/30/24 1159       Check-In   Supervising physician immediately available to respond to emergencies [facility] MD immediately available    Physician(s) Damien Braver, NP    Location [facility]    Staff Present [name], [name], RN, BSN;Randi Midge BS, ACSM-CEP, Exercise Physiologist;Kaylee Nicholaus, MS, ACSM-CEP, Exercise Physiologist    Virtual Visit No    Medication changes reported     No    Fall or balance concerns reported    No    Tobacco Cessation No Change    Warm-up and Cool-down Performed as group-led instruction    Resistance Training Performed Yes    VAD Patient? No    PAD/SET Patient? No      Pain Assessment   Currently in Pain? No/denies    Pain Score 0-No pain    Multiple Pain Sites No          Capillary Blood Glucose: No results found for this or any previous visit (from the past 24 hours).    Social History   Tobacco Use  Smoking Status Former   Current packs/day: 0.00   Average packs/day: 0.5 packs/day for 50.3 years (25.2 ttl pk-yrs)   Types: Cigarettes   Start date: 51   Quit date: 12/11/2023   Years since quitting: 0.4  Smokeless Tobacco Never  Tobacco Comments   Started smoking at 68 years old.   Smoked 1 PPD at his heaviest.   Quit smoking on 12/11/2023    Goals Met:  Proper associated with RPD/PD & O2 Sat Independence with exercise equipment Exercise tolerated well No report of concerns or symptoms today Strength training completed today  Goals Unmet:  Not Applicable  Comments: Service time is from 1023 to 1142.    Dr. Slater Staff is Medical Director for Pulmonary  Rehab at [facility].

## 2024-06-02 DIAGNOSIS — Z902 Acquired absence of lung [part of]: Secondary | ICD-10-CM | POA: Diagnosis not present

## 2024-06-04 ENCOUNTER — Encounter (HOSPITAL_COMMUNITY)
Admission: RE | Admit: 2024-06-04 | Discharge: 2024-06-04 | Disposition: A | Discharge status: Home / Self Care | Source: Ambulatory Visit | Attending: Student in an Organized Health Care Education/Training Program | Admitting: Student in an Organized Health Care Education/Training Program

## 2024-06-04 ENCOUNTER — Ambulatory Visit
Attending: Thoracic Surgery (Cardiothoracic Vascular Surgery) | Admitting: Thoracic Surgery (Cardiothoracic Vascular Surgery)

## 2024-06-04 ENCOUNTER — Ambulatory Visit (HOSPITAL_COMMUNITY)
Admission: RE | Admit: 2024-06-04 | Discharge: 2024-06-04 | Disposition: A | Discharge status: Home / Self Care | Source: Ambulatory Visit | Attending: Cardiology | Admitting: Cardiology

## 2024-06-04 VITALS — Wt 175.3 lb

## 2024-06-04 VITALS — BP 108/63 | HR 70 | Ht 74.0 in | Wt 174.0 lb

## 2024-06-04 DIAGNOSIS — C3432 Malignant neoplasm of lower lobe, left bronchus or lung: Secondary | ICD-10-CM

## 2024-06-04 DIAGNOSIS — Z09 Encounter for follow-up examination after completed treatment for conditions other than malignant neoplasm: Secondary | ICD-10-CM

## 2024-06-04 DIAGNOSIS — I5042 Chronic combined systolic (congestive) and diastolic (congestive) heart failure: Secondary | ICD-10-CM | POA: Diagnosis not present

## 2024-06-04 DIAGNOSIS — R911 Solitary pulmonary nodule: Secondary | ICD-10-CM | POA: Diagnosis not present

## 2024-06-04 DIAGNOSIS — R9389 Abnormal findings on diagnostic imaging of other specified body structures: Secondary | ICD-10-CM | POA: Diagnosis not present

## 2024-06-04 DIAGNOSIS — R918 Other nonspecific abnormal finding of lung field: Secondary | ICD-10-CM | POA: Diagnosis not present

## 2024-06-04 NOTE — Progress Notes (Signed)
 [address], [name] [address]             [phone]    HPI: Brett [name] returns for a scheduled follow-up visit after lobectomy.  Brett [name] is a 68 year old man with a history of tobacco use, COPD, lung cancer, paroxysmal SVT, nonsustained VT, scoliosis, multiple hip and back surgeries, chronic narcotic dependent pain, rosacea, glaucoma.  He was found to have a lung nodule on a cardiac stress PET.  On follow-up the nodule increased in size and was hypermetabolic with SUV of 6.2.  He underwent a robotic assisted left lower lobectomy on 02/19/2024.  Complicated initially by an air leak.  I placed intrabronchial valves on 02/23/2024 but he developed refractory hypoxia in the operating room and we had to take those back out.  Interestingly, his airleak resolved.  He was discharged on oxygen .  In the interim since his last visit he had a PET to evaluate for cardiac sarcoidosis.  That showed normal LV systolic and diastolic function.  Incidentally noted to have an 8th rib fracture.  He saw Dr. Sherrod.  Does not require any adjuvant therapy.  Still on 4 L nasal cannula oxygen .  Sometimes will check himself without it and his sats are okay at rest but desats with any activity.  He is doing pulmonary rehab.  Pain has improved significantly.  Past Medical History:  Diagnosis Date   Allergy    Arthritis    Asthma    Bilateral low back pain without sciatica 09/22/2015   Blood transfusion without reported diagnosis    Cancer (HCC)    Left Lower Lobe Adenocarcinoma   Cardiomyopathy (HCC)    Cervical neck pain with evidence of disc disease 09/22/2015   Chicken pox    Congenital hip deformity    Corn of foot 08/09/2022   Degenerative disc disease, cervical 01/29/2015   Dysrhythmia    SVT   Eczema 05/30/2013   Erectile dysfunction 09/22/2015   Glaucoma 05/30/2013   History of cardiac dysrhythmia 05/23/2023   Isthmic spondylolisthesis 05/05/2020   Lumbar  adjacent segment disease with spondylolisthesis 05/09/2023   Need for shingles vaccine 09/22/2015   Neuromuscular disorder (HCC) 11/2019   sciatica   Nodule of left lung    Nonsustained ventricular tachycardia (HCC), identified on event monitor November 2024, 1 episode 10 beats duration, asymptomatic. 08/06/2023   Pain of left hip joint 11/01/2018   Paroxysmal SVT (supraventricular tachycardia) (HCC), incidental finding IntraOp back surgery requiring esmolol ; event monitor November 2024 for short runs, longest episode 17 beats. 06/19/2023   Event monitor November 2024, supraventricular ectopy burden 1.7%.  4 short runs of SVT, longest episode 17 beats.  No symptoms reported     Personal history of congenital hip dysplasia 05/30/2013   Preventative health care 01/29/2015   Rhinitis, allergic 09/22/2015   Rosacea 05/30/2013   Scoliosis deformity of spine 05/05/2020   Spinal stenosis of lumbar region 06/16/2023   Spondylolisthesis at L5-S1 level 09/14/2020   Tobacco abuse 05/30/2013    Current Outpatient Medications  Medication Sig Dispense Refill   acetaminophen  (TYLENOL ) 500 MG tablet Take 500 mg by mouth every 6 (six) hours as needed for moderate pain (pain score 4-6).     apixaban  (ELIQUIS ) 5 MG TABS tablet Take 1 tablet (5 mg total) by mouth 2 (two) times daily. 180 tablet 3   aspirin  EC 81 MG tablet Take 1 tablet (81 mg total) by mouth daily. Swallow whole.  calcium  carbonate (TUMS EX) 750 MG chewable tablet Chew 1-2 tablets by mouth 2 (two) times daily as needed for heartburn.     Clotrimazole  1 % OINT Apply to affected area twice daily as needed 56.7 g 0   empagliflozin  (JARDIANCE ) 10 MG TABS tablet Take 1 tablet (10 mg total) by mouth daily before breakfast. 90 tablet 1   fluticasone -salmeterol (WIXELA INHUB) 100-50 MCG/ACT AEPB Inhale 1 puff into the lungs 2 (two) times daily. 60 each 11   furosemide  (LASIX ) 20 MG tablet Take 1 tablet (20 mg total) by mouth as directed. Take 20  mg daily and an additional 20 mg every other day 135 tablet 3   ibuprofen  (ADVIL ) 200 MG tablet Take 200 mg by mouth every 6 (six) hours as needed for moderate pain (pain score 4-6).     metoprolol  succinate (TOPROL  XL) 25 MG 24 hr tablet Take 1 tablet (25 mg total) by mouth daily. 90 tablet 3   Multiple Vitamin (MULTIVITAMIN WITH MINERALS) TABS tablet Take 1 tablet by mouth in the morning.     Oxycodone  HCl 10 MG TABS Take 1 tablet by mouth every 6 (six) hours as needed.     OXYGEN  Inhale 2 L/min into the lungs continuous.     STELARA 45 MG/0.5ML injection Inject 45 mg into the skin as directed. Every 12 weeks     Timolol  Maleate, Once-Daily, 0.5 % SOLN Place 1 drop into both eyes in the morning and at bedtime.     TRAVATAN Z 0.004 % SOLN ophthalmic solution Place 1 drop into both eyes every morning.  1   No current facility-administered medications for this visit.    Physical Exam BP 108/63   Pulse 70   Ht 6' 2 (1.88 m)   Wt 174 lb (78.9 kg)   SpO2 92% Comment: 4L O2 per Brett [name]  BMI 22.21 kg/m  68 year old man in no acute distress Alert and oriented x 3 with no focal deficits Lungs diminished at left base no wheezing Cardiac regular rate and rhythm  Diagnostic Tests: I personally reviewed his chest x-ray images.  Postoperative changes from left lower lobectomy.  Persistent opacity at the left base  Impression: Brett [name] is a 68 year old man with a history of tobacco use, COPD, lung cancer, paroxysmal SVT, nonsustained VT, scoliosis, multiple hip and back surgeries, chronic narcotic dependent pain, rosacea, glaucoma.    Status post left lower lobectomy-doing well from a pain standpoint.  He is still on oxygen .  Probably does not need to be on 4 L at all times.  Hopefully can wean that down.  He is doing pulmonary rehab and hopefully that will help as well.  He is seeing Dr. Isadora as well.  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  No adjuvant therapy  indicated.  Will follow-up with Dr. Sherrod.  Plan: Return in 6 weeks with PA lateral chest x-ray  Brett JAYSON Millers, MD [facility] [phone]

## 2024-06-05 NOTE — Progress Notes (Signed)
 Pulmonary Individual Treatment Plan  Patient Details  Name: Brett [name] MRN: [id number] Date of Birth: June 13, 1956 Referring Provider:   [name] Pulmonary Rehab Walk Test from 04/26/2024 in [facility]  Referring Provider [name]    Initial Encounter Date:  Flowsheet Row Pulmonary Rehab Walk Test from 04/26/2024 in [facility]  Date 04/26/24    Visit Diagnosis: Malignant neoplasm of lower lobe bronchus, left (HCC)  Chronic combined systolic and diastolic heart failure (HCC)  Patient's Home Medications on Admission:   Current Outpatient Medications:    acetaminophen  (TYLENOL ) 500 MG tablet, Take 500 mg by mouth every 6 (six) hours as needed for moderate pain (pain score 4-6)., Disp: , Rfl:    apixaban  (ELIQUIS ) 5 MG TABS tablet, Take 1 tablet (5 mg total) by mouth 2 (two) times daily., Disp: 180 tablet, Rfl: 3   aspirin  EC 81 MG tablet, Take 1 tablet (81 mg total) by mouth daily. Swallow whole., Disp: , Rfl:    calcium  carbonate (TUMS EX) 750 MG chewable tablet, Chew 1-2 tablets by mouth 2 (two) times daily as needed for heartburn., Disp: , Rfl:    Clotrimazole  1 % OINT, Apply to affected area twice daily as needed, Disp: 56.7 g, Rfl: 0   empagliflozin  (JARDIANCE ) 10 MG TABS tablet, Take 1 tablet (10 mg total) by mouth daily before breakfast., Disp: 90 tablet, Rfl: 1   fluticasone -salmeterol (WIXELA INHUB) 100-50 MCG/ACT AEPB, Inhale 1 puff into the lungs 2 (two) times daily., Disp: 60 each, Rfl: 11   furosemide  (LASIX ) 20 MG tablet, Take 1 tablet (20 mg total) by mouth as directed. Take 20 mg daily and an additional 20 mg every other day, Disp: 135 tablet, Rfl: 3   ibuprofen  (ADVIL ) 200 MG tablet, Take 200 mg by mouth every 6 (six) hours as needed for moderate pain (pain score 4-6)., Disp: , Rfl:    metoprolol  succinate (TOPROL  XL) 25 MG 24 hr tablet, Take 1  tablet (25 mg total) by mouth daily., Disp: 90 tablet, Rfl: 3   Multiple Vitamin (MULTIVITAMIN WITH MINERALS) TABS tablet, Take 1 tablet by mouth in the morning., Disp: , Rfl:    Oxycodone  HCl 10 MG TABS, Take 1 tablet by mouth every 6 (six) hours as needed., Disp: , Rfl:    OXYGEN , Inhale 2 L/min into the lungs continuous., Disp: , Rfl:    STELARA 45 MG/0.5ML injection, Inject 45 mg into the skin as directed. Every 12 weeks, Disp: , Rfl:    Timolol  Maleate, Once-Daily, 0.5 % SOLN, Place 1 drop into both eyes in the morning and at bedtime., Disp: , Rfl:    TRAVATAN Z 0.004 % SOLN ophthalmic solution, Place 1 drop into both eyes every morning., Disp: , Rfl: 1  Past Medical History: Past Medical History:  Diagnosis Date   Allergy    Arthritis    Asthma    Bilateral low back pain without sciatica 09/22/2015   Blood transfusion without reported diagnosis    Cancer (HCC)    Left Lower Lobe Adenocarcinoma   Cardiomyopathy (HCC)    Cervical neck pain with evidence of disc disease 09/22/2015   Chicken pox    Congenital hip deformity    Corn of foot 08/09/2022   Degenerative disc disease, cervical 01/29/2015   Dysrhythmia    SVT   Eczema 05/30/2013   Erectile dysfunction 09/22/2015   Glaucoma 05/30/2013  History of cardiac dysrhythmia 05/23/2023   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 08/06/2023   Pain of left hip joint 11/01/2018   Paroxysmal SVT (supraventricular tachycardia) (HCC), incidental finding IntraOp back surgery requiring esmolol ; event monitor November 2024 for short runs, longest episode 17 beats. 06/19/2023   Event monitor November 2024, supraventricular ectopy  burden 1.7%.  4 short runs of SVT, longest episode 17 beats.  No symptoms reported     Personal history of congenital hip dysplasia 05/30/2013   Preventative health care 01/29/2015   Rhinitis, allergic 09/22/2015   Rosacea 05/30/2013   Scoliosis deformity of spine 05/05/2020   Spinal stenosis of lumbar region 06/16/2023   Spondylolisthesis at L5-S1 level 09/14/2020   Tobacco abuse 05/30/2013    Tobacco Use: Social History   Tobacco Use  Smoking Status Former   Current packs/day: 0.00   Average packs/day: 0.5 packs/day for 50.3 years (25.2 ttl pk-yrs)   Types: Cigarettes   Start date: 84   Quit date: 12/11/2023   Years since quitting: 0.4  Smokeless Tobacco Never  Tobacco Comments   Started smoking at 68 years old.   Smoked 1 PPD at his heaviest.   Quit smoking on 12/11/2023    Labs: Review Flowsheet  More data exists      Latest Ref Rng & Units 03/24/2017 04/13/2018 04/19/2019 04/24/2020 05/10/2024  Labs for ITP Cardiac and Pulmonary Rehab  Cholestrol <200 mg/dL 839  821  825  821  -  LDL (calc) mg/dL (calc) 83  77  72  83  -  HDL-C > OR = 40 mg/dL 37.49  08.59  07.69  78  -  Trlycerides <150 mg/dL 25.9  48.9  50.9  79  -  PH, Arterial 7.35 - 7.45 - - - - 7.410   PCO2 arterial 32 - 48 mmHg - - - - 42.1   Bicarbonate 20.0 - 28.0 mmol/L - - - - 26.6  27.4   TCO2 22 - 32 mmol/L - - - - 28  29   O2 Saturation % - - - - 94  68     Details       Multiple values from one day are sorted in reverse-chronological order         Capillary Blood Glucose: Lab Results  Component Value Date   GLUCAP 95 12/22/2023   GLUCAP 93 09/22/2022   GLUCAP 127 (H) 04/09/2008   GLUCAP 116 (H) 04/09/2008     Pulmonary Assessment Scores:  Pulmonary Assessment Scores     Row Name 04/26/24 1052         ADL UCSD   ADL Phase Entry     SOB Score total 88       CAT Score   CAT Score 32       mMRC Score   mMRC Score 3       UCSD: Self-administered rating of dyspnea associated  with activities of daily living (ADLs) 6-point scale (0 = not at all to 5 = maximal or unable to do because of breathlessness)  Scoring Scores range from 0 to 120.  Minimally important difference is 5 units  CAT: CAT can identify the health impairment of COPD patients and is  better correlated with disease progression.  CAT has a scoring range of zero to 40. The CAT score is classified into four groups of low (less than 10), medium (10 - 20), high (21-30) and very high (31-40) based on the impact level of disease on health status. A CAT score over 10 suggests significant symptoms.  A worsening CAT score could be explained by an exacerbation, poor medication adherence, poor inhaler technique, or progression of COPD or comorbid conditions.  CAT MCID is 2 points  mMRC: mMRC (Modified Medical Research Council) Dyspnea Scale is used to assess the degree of baseline functional disability in patients of respiratory disease due to dyspnea. No minimal important difference is established. A decrease in score of 1 point or greater is considered a positive change.   Pulmonary Function Assessment:  Pulmonary Function Assessment - 04/26/24 1105       Breath   Bilateral Breath Sounds Decreased;Clear    Shortness of Breath Yes;Limiting activity;Fear of Shortness of Breath          Exercise Target Goals: Exercise Program Goal: Individual exercise prescription set using results from initial 6 min walk test and THRR while considering  patient's activity barriers and safety.   Exercise Prescription Goal: Initial exercise prescription builds to 30-45 minutes a day of aerobic activity, 2-3 days per week.  Home exercise guidelines will be given to patient during program as part of exercise prescription that the participant will acknowledge.  Activity Barriers & Risk Stratification:  Activity Barriers & Cardiac Risk Stratification - 04/26/24 1104       Activity Barriers & Cardiac Risk Stratification    Activity Barriers Deconditioning;Muscular Weakness;Shortness of Breath;Right Hip Replacement;Back Problems    Cardiac Risk Stratification Moderate          6 Minute Walk:  6 Minute Walk     Row Name 04/26/24 1211         6 Minute Walk   Phase Initial     Distance 661 feet     Walk Time 6 minutes     # of Rest Breaks 1  3:55-4:12     MPH 1.25     METS 2.07     RPE 12     Perceived Dyspnea  1     VO2 Peak 7.24     Symptoms Yes (comment)     Comments hip and back pain     Resting HR 74 bpm     Resting BP 118/60     Resting Oxygen  Saturation  90 %     Exercise Oxygen  Saturation  during 6 min walk 86 %     Max Ex. HR 76 bpm     Max Ex. BP 122/64     2 Minute Post BP 112/60       Interval HR   1 Minute HR 72     2 Minute HR 71     3 Minute HR 75     4 Minute HR 71     5 Minute HR 76     6 Minute HR 76     2 Minute Post HR 60     Interval Heart Rate? Yes       Interval Oxygen    Interval Oxygen ? Yes     Baseline Oxygen  Saturation % 90 %     1 Minute Oxygen  Saturation % 90 %     1 Minute Liters of Oxygen  4 L     2 Minute Oxygen  Saturation %  86 %  O2 increased to 6L     2 Minute Liters of Oxygen  4 L  increased to 6L     3 Minute Oxygen  Saturation % 90 %     3 Minute Liters of Oxygen  6 L     4 Minute Oxygen  Saturation % 91 %     4 Minute Liters of Oxygen  6 L     5 Minute Oxygen  Saturation % 90 %     5 Minute Liters of Oxygen  6 L     6 Minute Oxygen  Saturation % 94 %     6 Minute Liters of Oxygen  6 L     2 Minute Post Oxygen  Saturation % 97 %     2 Minute Post Liters of Oxygen  6 L        Oxygen  Initial Assessment:  Oxygen  Initial Assessment - 04/26/24 1048       Home Oxygen    Home Oxygen  Device Home Concentrator;E-Tanks    Sleep Oxygen  Prescription Continuous    Liters per minute 4    Home Exercise Oxygen  Prescription Continuous    Liters per minute 4    Home Resting Oxygen  Prescription Continuous    Liters per minute 4      Initial 6 min Walk    Oxygen  Used Continuous    Liters per minute 6      Program Oxygen  Prescription   Program Oxygen  Prescription Continuous    Liters per minute 6      Intervention   Short Term Goals To learn and exhibit compliance with exercise, home and travel O2 prescription;To learn and understand importance of monitoring SPO2 with pulse oximeter and demonstrate accurate use of the pulse oximeter.;To learn and understand importance of maintaining oxygen  saturations>88%;To learn and demonstrate proper pursed lip breathing techniques or other breathing techniques. ;To learn and demonstrate proper use of respiratory medications    Long  Term Goals Exhibits compliance with exercise, home  and travel O2 prescription;Maintenance of O2 saturations>88%;Compliance with respiratory medication;Verbalizes importance of monitoring SPO2 with pulse oximeter and return demonstration;Exhibits proper breathing techniques, such as pursed lip breathing or other method taught during program session;Demonstrates proper use of MDI's          Oxygen  Re-Evaluation:  Oxygen  Re-Evaluation     Row Name 04/29/24 0951 05/24/24 1023           Program Oxygen  Prescription   Program Oxygen  Prescription Continuous Continuous      Liters per minute 6 6        Home Oxygen    Home Oxygen  Device Home Concentrator;E-Tanks Home Concentrator;E-Tanks      Sleep Oxygen  Prescription Continuous Continuous      Liters per minute 4 4      Home Exercise Oxygen  Prescription Continuous Continuous      Liters per minute 4 4      Home Resting Oxygen  Prescription Continuous Continuous      Liters per minute 4 4        Goals/Expected Outcomes   Short Term Goals To learn and exhibit compliance with exercise, home and travel O2 prescription;To learn and understand importance of monitoring SPO2 with pulse oximeter and demonstrate accurate use of the pulse oximeter.;To learn and understand importance of maintaining oxygen  saturations>88%;To learn and  demonstrate proper pursed lip breathing techniques or other breathing techniques. ;To learn and demonstrate proper use of respiratory medications To learn and exhibit compliance with exercise, home and travel O2 prescription;To learn and understand importance of  monitoring SPO2 with pulse oximeter and demonstrate accurate use of the pulse oximeter.;To learn and understand importance of maintaining oxygen  saturations>88%;To learn and demonstrate proper pursed lip breathing techniques or other breathing techniques. ;To learn and demonstrate proper use of respiratory medications      Long  Term Goals Exhibits compliance with exercise, home  and travel O2 prescription;Maintenance of O2 saturations>88%;Compliance with respiratory medication;Verbalizes importance of monitoring SPO2 with pulse oximeter and return demonstration;Exhibits proper breathing techniques, such as pursed lip breathing or other method taught during program session;Demonstrates proper use of MDI's Exhibits compliance with exercise, home  and travel O2 prescription;Maintenance of O2 saturations>88%;Compliance with respiratory medication;Verbalizes importance of monitoring SPO2 with pulse oximeter and return demonstration;Exhibits proper breathing techniques, such as pursed lip breathing or other method taught during program session;Demonstrates proper use of MDI's      Goals/Expected Outcomes Compliance and understanding of oxygen  saturation monitoring and breathing techniques to decrease shortness of breath Compliance and understanding of oxygen  saturation monitoring and breathing techniques to decrease shortness of breath         Oxygen  Discharge (Final Oxygen  Re-Evaluation):  Oxygen  Re-Evaluation - 05/24/24 1023       Program Oxygen  Prescription   Program Oxygen  Prescription Continuous    Liters per minute 6      Home Oxygen    Home Oxygen  Device Home Concentrator;E-Tanks    Sleep Oxygen  Prescription Continuous    Liters per minute  4    Home Exercise Oxygen  Prescription Continuous    Liters per minute 4    Home Resting Oxygen  Prescription Continuous    Liters per minute 4      Goals/Expected Outcomes   Short Term Goals To learn and exhibit compliance with exercise, home and travel O2 prescription;To learn and understand importance of monitoring SPO2 with pulse oximeter and demonstrate accurate use of the pulse oximeter.;To learn and understand importance of maintaining oxygen  saturations>88%;To learn and demonstrate proper pursed lip breathing techniques or other breathing techniques. ;To learn and demonstrate proper use of respiratory medications    Long  Term Goals Exhibits compliance with exercise, home  and travel O2 prescription;Maintenance of O2 saturations>88%;Compliance with respiratory medication;Verbalizes importance of monitoring SPO2 with pulse oximeter and return demonstration;Exhibits proper breathing techniques, such as pursed lip breathing or other method taught during program session;Demonstrates proper use of MDI's    Goals/Expected Outcomes Compliance and understanding of oxygen  saturation monitoring and breathing techniques to decrease shortness of breath          Initial Exercise Prescription:  Initial Exercise Prescription - 04/26/24 1200       Date of Initial Exercise RX and Referring Provider   Date 04/26/24    Referring Provider [name]    Expected Discharge Date 07/23/24      Oxygen    Oxygen  Continuous    Liters 6    Maintain Oxygen  Saturation 88% or higher      NuStep   Level 1    SPM 54    Minutes 30    METs 1.4      Prescription Details   Frequency (times per week) 2    Duration Progress to 30 minutes of continuous aerobic without signs/symptoms of physical distress      Intensity   THRR 40-80% of Max Heartrate 60-121    Ratings of Perceived Exertion 11-13    Perceived Dyspnea 0-4      Progression   Progression Continue to progress workloads to maintain intensity without  signs/symptoms of physical distress.  Resistance Training   Training Prescription Yes    Weight red bands    Reps 10-15          Perform Capillary Blood Glucose checks as needed.  Exercise Prescription Changes:   Exercise Prescription Changes     Row Name 05/07/24 0900 05/21/24 1200 06/04/24 1200         Response to Exercise   Blood Pressure (Admit) 112/58 114/52 102/60     Blood Pressure (Exercise) 92/52 132/70 110/60     Blood Pressure (Exit) 110/62 102/60 102/62     Heart Rate (Admit) 50 bpm 64 bpm 55 bpm     Heart Rate (Exercise) 57 bpm 67 bpm 61 bpm     Heart Rate (Exit) 61 bpm 73 bpm 54 bpm     Oxygen  Saturation (Admit) 99 % 97 % 100 %     Oxygen  Saturation (Exercise) 97 % 95 % 96 %     Oxygen  Saturation (Exit) 100 % 97 % 98 %     Rating of Perceived Exertion (Exercise) 14 13 13      Perceived Dyspnea (Exercise) 1 2.5 2     Symptoms Dizziness -- --     Duration Progress to 30 minutes of  aerobic without signs/symptoms of physical distress Continue with 30 min of aerobic exercise without signs/symptoms of physical distress. Continue with 30 min of aerobic exercise without signs/symptoms of physical distress.     Intensity THRR unchanged THRR unchanged THRR unchanged       Progression   Progression Continue to progress workloads to maintain intensity without signs/symptoms of physical distress. Continue to progress workloads to maintain intensity without signs/symptoms of physical distress. Continue to progress workloads to maintain intensity without signs/symptoms of physical distress.       Resistance Training   Training Prescription Yes Yes Yes     Weight red bands red bands red bands     Reps 10-15 10-15 10-15     Time 10 Minutes 10 Minutes 10 Minutes       Oxygen    Oxygen  Continuous Continuous Continuous     Liters 4 4 4        NuStep   Level 1 2 3      SPM 54 50 65     Minutes 30 30 30      METs 1.7 1.7 1.8       Oxygen    Maintain Oxygen  Saturation  88% or higher -- 88% or higher        Exercise Comments:   Exercise Goals and Review:   Exercise Goals     Row Name 04/26/24 1048             Exercise Goals   Increase Physical Activity Yes       Intervention Provide advice, education, support and counseling about physical activity/exercise needs.;Develop an individualized exercise prescription for aerobic and resistive training based on initial evaluation findings, risk stratification, comorbidities and participant's personal goals.       Expected Outcomes Short Term: Attend rehab on a regular basis to increase amount of physical activity.;Long Term: Add in home exercise to make exercise part of routine and to increase amount of physical activity.;Long Term: Exercising regularly at least 3-5 days a week.       Increase Strength and Stamina Yes       Intervention Provide advice, education, support and counseling about physical activity/exercise needs.;Develop an individualized exercise prescription for aerobic and resistive training based on initial evaluation findings, risk  stratification, comorbidities and participant's personal goals.       Expected Outcomes Short Term: Increase workloads from initial exercise prescription for resistance, speed, and METs.;Short Term: Perform resistance training exercises routinely during rehab and add in resistance training at home;Long Term: Improve cardiorespiratory fitness, muscular endurance and strength as measured by increased METs and functional capacity ( )       Able to understand and use rate of perceived exertion (RPE) scale Yes       Intervention Provide education and explanation on how to use RPE scale       Expected Outcomes Short Term: Able to use RPE daily in rehab to express subjective intensity level;Long Term:  Able to use RPE to guide intensity level when exercising independently       Able to understand and use Dyspnea scale Yes       Intervention Provide education and  explanation on how to use Dyspnea scale       Expected Outcomes Short Term: Able to use Dyspnea scale daily in rehab to express subjective sense of shortness of breath during exertion;Long Term: Able to use Dyspnea scale to guide intensity level when exercising independently       Knowledge and understanding of Target Heart Rate Range (THRR) Yes       Intervention Provide education and explanation of THRR including how the numbers were predicted and where they are located for reference       Expected Outcomes Short Term: Able to state/look up THRR;Long Term: Able to use THRR to govern intensity when exercising independently;Short Term: Able to use daily as guideline for intensity in rehab       Understanding of Exercise Prescription Yes       Intervention Provide education, explanation, and written materials on patient's individual exercise prescription       Expected Outcomes Short Term: Able to explain program exercise prescription;Long Term: Able to explain home exercise prescription to exercise independently          Exercise Goals Re-Evaluation :  Exercise Goals Re-Evaluation     Row Name 04/29/24 0950 05/24/24 1009           Exercise Goal Re-Evaluation   Exercise Goals Review Increase Physical Activity;Understanding of Exercise Prescription;Able to understand and use Dyspnea scale;Increase Strength and Stamina;Knowledge and understanding of Target Heart Rate Range (THRR);Able to understand and use rate of perceived exertion (RPE) scale Increase Physical Activity;Understanding of Exercise Prescription;Able to understand and use Dyspnea scale;Increase Strength and Stamina;Knowledge and understanding of Target Heart Rate Range (THRR);Able to understand and use rate of perceived exertion (RPE) scale      Comments Brett [name] is scheduled to start exercise on 9/25. Will continue to monitor and progress as able. Brett [name] has completed 6 exercise sessions. He exercises for 30 min on the Nustep. He averages  1.8 METs at level 2 on the Nustep. He performs the warmup and cooldown standing/ seated dependent on his shortness of breath/ fatigue. Brett [name] has increased his level on the Nustep as METs have slightly increased. He is very deconditioned. Will continue to monitor and progress as able.      Expected Outcomes Through exercise at rehab and home, the patient will decrease shortness of breath with daily activities and feel confident in carrying out an exercise regimen at home. Through exercise at rehab and home, the patient will decrease shortness of breath with daily activities and feel confident in carrying out an exercise regimen at home.  Discharge Exercise Prescription (Final Exercise Prescription Changes):  Exercise Prescription Changes - 06/04/24 1200       Response to Exercise   Blood Pressure (Admit) 102/60    Blood Pressure (Exercise) 110/60    Blood Pressure (Exit) 102/62    Heart Rate (Admit) 55 bpm    Heart Rate (Exercise) 61 bpm    Heart Rate (Exit) 54 bpm    Oxygen  Saturation (Admit) 100 %    Oxygen  Saturation (Exercise) 96 %    Oxygen  Saturation (Exit) 98 %    Rating of Perceived Exertion (Exercise) 13    Perceived Dyspnea (Exercise) 2    Duration Continue with 30 min of aerobic exercise without signs/symptoms of physical distress.    Intensity THRR unchanged      Progression   Progression Continue to progress workloads to maintain intensity without signs/symptoms of physical distress.      Resistance Training   Training Prescription Yes    Weight red bands    Reps 10-15    Time 10 Minutes      Oxygen    Oxygen  Continuous    Liters 4      NuStep   Level 3    SPM 65    Minutes 30    METs 1.8      Oxygen    Maintain Oxygen  Saturation 88% or higher          Nutrition:  Target Goals: Understanding of nutrition guidelines, daily intake of sodium 1500mg , cholesterol 200mg , calories 30% from fat and 7% or less from saturated fats, daily to have 5 or more  servings of fruits and vegetables.  Biometrics:    Nutrition Therapy Plan and Nutrition Goals:   Nutrition Assessments:  MEDIFICTS Score Key: >=70 Need to make dietary changes  40-70 Heart Healthy Diet <= 40 Therapeutic Level Cholesterol Diet   Picture Your Plate Scores: <59 Unhealthy dietary pattern with much room for improvement. 41-50 Dietary pattern unlikely to meet recommendations for good health and room for improvement. 51-60 More healthful dietary pattern, with some room for improvement.  >60 Healthy dietary pattern, although there may be some specific behaviors that could be improved.    Nutrition Goals Re-Evaluation:   Nutrition Goals Discharge (Final Nutrition Goals Re-Evaluation):   Psychosocial: Target Goals: Acknowledge presence or absence of significant depression and/or stress, maximize coping skills, provide positive support system. Participant is able to verbalize types and ability to use techniques and skills needed for reducing stress and depression.  Initial Review & Psychosocial Screening:  Initial Psych Review & Screening - 04/26/24 1059       Initial Review   Current issues with None Identified      Family Dynamics   Good Support System? Yes      Barriers   Psychosocial barriers to participate in program There are no identifiable barriers or psychosocial needs.      Screening Interventions   Interventions Encouraged to exercise          Quality of Life Scores:  Scores of 19 and below usually indicate a poorer quality of life in these areas.  A difference of  2-3 points is a clinically meaningful difference.  A difference of 2-3 points in the total score of the Quality of Life Index has been associated with significant improvement in overall quality of life, self-image, physical symptoms, and general health in studies assessing change in quality of life.  PHQ-9: Review Flowsheet  More data exists      04/26/2024  01/16/2024 02/16/2023  11/01/2021 04/26/2021  Depression screen PHQ 2/9  Decreased Interest 0 0 1 0 0  Down, Depressed, Hopeless 0 0 0 0 0  PHQ - 2 Score 0 0 1 0 0  Altered sleeping 0 - - - -  Tired, decreased energy 0 - - - -  Change in appetite 0 - - - -  Feeling bad or failure about yourself  0 - - - -  Trouble concentrating 0 - - - -  Moving slowly or fidgety/restless 0 - - - -  Suicidal thoughts 0 - - - -  PHQ-9 Score 0 - - - -  Difficult doing work/chores Not difficult at all - - - -   Interpretation of Total Score  Total Score Depression Severity:  1-4 = Minimal depression, 5-9 = Mild depression, 10-14 = Moderate depression, 15-19 = Moderately severe depression, 20-27 = Severe depression   Psychosocial Evaluation and Intervention:  Psychosocial Evaluation - 04/26/24 1155       Psychosocial Evaluation & Interventions   Interventions Encouraged to exercise with the program and follow exercise prescription    Comments Brett [name] denies any psychosocial barriers or concerns at this time    Expected Outcomes For Brett [name] to participate in PR free of any psychosocial barriers or concerns    Continue Psychosocial Services  No Follow up required          Psychosocial Re-Evaluation:  Psychosocial Re-Evaluation     Row Name 04/29/24 1438 05/29/24 1030           Psychosocial Re-Evaluation   Current issues with None Identified None Identified      Comments Brett [name] is scheduled to start PR on 05/02/24. No new psychosocial barriers or concerns at this time. Brett [name] continues to deny any new psy/soc barriers or concerns at this time. He enjoy's coming to class and exercising along with being able to socialize with the other class participants. He has good support from his daughter.      Expected Outcomes For Brett [name] to participate in PR free of any psychosocial barriers or concerns For Brett [name] to participate in PR free of any psychosocial barriers or concerns      Interventions Encouraged to attend Pulmonary  Rehabilitation for the exercise Encouraged to attend Pulmonary Rehabilitation for the exercise      Continue Psychosocial Services  No Follow up required No Follow up required         Psychosocial Discharge (Final Psychosocial Re-Evaluation):  Psychosocial Re-Evaluation - 05/29/24 1030       Psychosocial Re-Evaluation   Current issues with None Identified    Comments Brett [name] continues to deny any new psy/soc barriers or concerns at this time. He enjoy's coming to class and exercising along with being able to socialize with the other class participants. He has good support from his daughter.    Expected Outcomes For Brett [name] to participate in PR free of any psychosocial barriers or concerns    Interventions Encouraged to attend Pulmonary Rehabilitation for the exercise    Continue Psychosocial Services  No Follow up required          Education: Education Goals: Education classes will be provided on a weekly basis, covering required topics. Participant will state understanding/return demonstration of topics presented.  Learning Barriers/Preferences:  Learning Barriers/Preferences - 04/26/24 1101       Learning Barriers/Preferences   Learning Barriers Sight    Learning Preferences None          Education Topics:  Know Your Numbers Group instruction that is supported by a PowerPoint presentation. Instructor discusses importance of knowing and understanding resting, exercise, and post-exercise oxygen  saturation, heart rate, and blood pressure. Oxygen  saturation, heart rate, blood pressure, rating of perceived exertion, and dyspnea are reviewed along with a normal range for these values.    Exercise for the Pulmonary Patient Group instruction that is supported by a PowerPoint presentation. Instructor discusses benefits of exercise, core components of exercise, frequency, duration, and intensity of an exercise routine, importance of utilizing pulse oximetry during exercise, safety while  exercising, and options of places to exercise outside of rehab.    MET Level  Group instruction provided by PowerPoint, verbal discussion, and written material to support subject matter. Instructor reviews what METs are and how to increase METs.    Pulmonary Medications Verbally interactive group education provided by instructor with focus on inhaled medications and proper administration.   Anatomy and Physiology of the Respiratory System Group instruction provided by PowerPoint, verbal discussion, and written material to support subject matter. Instructor reviews respiratory cycle and anatomical components of the respiratory system and their functions. Instructor also reviews differences in obstructive and restrictive respiratory diseases with examples of each.    Oxygen  Safety Group instruction provided by PowerPoint, verbal discussion, and written material to support subject matter. There is an overview of "What is Oxygen " and "Why do we need it".  Instructor also reviews how to create a safe environment for oxygen  use, the importance of using oxygen  as prescribed, and the risks of noncompliance. There is a brief discussion on traveling with oxygen  and resources the patient may utilize. Flowsheet Row PULMONARY REHAB OTHER RESPIRATORY from 05/02/2024 in [facility]  Date 05/02/24  Educator RN  Instruction Review Code 1- Verbalizes Understanding    Oxygen  Use Group instruction provided by PowerPoint, verbal discussion, and written material to discuss how supplemental oxygen  is prescribed and different types of oxygen  supply systems. Resources for more information are provided.  Flowsheet Row PULMONARY REHAB OTHER RESPIRATORY from 05/09/2024 in [facility]  Date 05/09/24  Educator RT  Instruction Review Code 1- Verbalizes Understanding    Breathing Techniques Group  instruction that is supported by demonstration and informational handouts. Instructor discusses the benefits of pursed lip and diaphragmatic breathing and detailed demonstration on how to perform both.     Risk Factor Reduction Group instruction that is supported by a PowerPoint presentation. Instructor discusses the definition of a risk factor, different risk factors for pulmonary disease, and how the heart and lungs work together.   Pulmonary Diseases Group instruction provided by PowerPoint, verbal discussion, and written material to support subject matter. Instructor gives an overview of the different type of pulmonary diseases. There is also a discussion on risk factors and symptoms as well as ways to manage the diseases.   Stress and Energy Conservation Group instruction provided by PowerPoint, verbal discussion, and written material to support subject matter. Instructor gives an overview of stress and the impact it can have on the body. Instructor also reviews ways to reduce stress. There is also a discussion on energy conservation and ways to conserve energy throughout the day. Flowsheet Row PULMONARY REHAB OTHER RESPIRATORY from 05/23/2024 in [facility]  Date 05/23/24  Educator RN  Instruction Review Code 1- Verbalizes Understanding    Warning Signs and Symptoms Group  instruction provided by PowerPoint, verbal discussion, and written material to support subject matter. Instructor reviews warning signs and symptoms of stroke, heart attack, cold and flu. Instructor also reviews ways to prevent the spread of infection. Flowsheet Row PULMONARY REHAB OTHER RESPIRATORY from 05/30/2024 in [facility]  Date 05/30/24  Educator RN  Instruction Review Code 1- Verbalizes Understanding    Other Education Group or individual verbal, written, or video instructions that support  the educational goals of the pulmonary rehab program.    Knowledge Questionnaire Score:  Knowledge Questionnaire Score - 04/26/24 1050       Knowledge Questionnaire Score   Pre Score 15/18          Core Components/Risk Factors/Patient Goals at Admission:  Personal Goals and Risk Factors at Admission - 04/26/24 1102       Core Components/Risk Factors/Patient Goals on Admission   Tobacco Cessation Yes    Number of packs per day vapes every now and then    Intervention Assist the participant in steps to quit. Provide individualized education and counseling about committing to Tobacco Cessation, relapse prevention, and pharmacological support that can be provided by physician.;Education officer, environmental, assist with locating and accessing local/national Quit Smoking programs, and support quit date choice.    Expected Outcomes Short Term: Will demonstrate readiness to quit, by selecting a quit date.;Short Term: Will quit all tobacco product use, adhering to prevention of relapse plan.;Long Term: Complete abstinence from all tobacco products for at least 12 months from quit date.    Improve shortness of breath with ADL's Yes    Intervention Provide education, individualized exercise plan and daily activity instruction to help decrease symptoms of SOB with activities of daily living.    Expected Outcomes Short Term: Improve cardiorespiratory fitness to achieve a reduction of symptoms when performing ADLs;Long Term: Be able to perform more ADLs without symptoms or delay the onset of symptoms          Core Components/Risk Factors/Patient Goals Review:   Goals and Risk Factor Review     Row Name 04/29/24 1440 05/29/24 1032           Core Components/Risk Factors/Patient Goals Review   Personal Goals Review Tobacco Cessation;Improve shortness of breath with ADL's;Develop more efficient breathing techniques such as purse lipped breathing and diaphragmatic breathing and practicing  self-pacing with activity. Tobacco Cessation;Improve shortness of breath with ADL's;Develop more efficient breathing techniques such as purse lipped breathing and diaphragmatic breathing and practicing self-pacing with activity.      Review Joziyah is scheduled to start PR on 05/02/24. Unable to assess his goals at this time. Monthly review of patient's Core Components/Risk Factors/Patient Goals are as follows: Goal progressing for tobacco cessation. Brett [name] has not yet quit vaping. Staff will provide Brett [name] with resources on ways to quit. Goal progressing for improving shortness of breath with ADL's. Brett [name] is currently using 4L O2 while exercising to keep sats >88%. He is exercising on the Nustep for 30 minutes and has been able to increase his workload and MET's. Goal progressing for developing more efficient breathing techniques such as purse lipped breathing and diaphragmatic breathing; and practicing self-pacing with activity.  We will continue to monitor Bob's progress throughout the program.      Expected Outcomes To improve shortness of breath with ADL's, develop more efficient breathing techniques such as purse lipped breathing and diaphragmatic breathing; and practicing self-pacing with activity and stop vaping. To improve  shortness of breath with ADL's, develop more efficient breathing techniques such as purse lipped breathing and diaphragmatic breathing; and practicing self-pacing with activity and stop vaping.         Core Components/Risk Factors/Patient Goals at Discharge (Final Review):   Goals and Risk Factor Review - 05/29/24 1032       Core Components/Risk Factors/Patient Goals Review   Personal Goals Review Tobacco Cessation;Improve shortness of breath with ADL's;Develop more efficient breathing techniques such as purse lipped breathing and diaphragmatic breathing and practicing self-pacing with activity.    Review Monthly review of patient's Core Components/Risk Factors/Patient Goals are as  follows: Goal progressing for tobacco cessation. Brett [name] has not yet quit vaping. Staff will provide Brett [name] with resources on ways to quit. Goal progressing for improving shortness of breath with ADL's. Brett [name] is currently using 4L O2 while exercising to keep sats >88%. He is exercising on the Nustep for 30 minutes and has been able to increase his workload and MET's. Goal progressing for developing more efficient breathing techniques such as purse lipped breathing and diaphragmatic breathing; and practicing self-pacing with activity.  We will continue to monitor Bob's progress throughout the program.    Expected Outcomes To improve shortness of breath with ADL's, develop more efficient breathing techniques such as purse lipped breathing and diaphragmatic breathing; and practicing self-pacing with activity and stop vaping.          ITP Comments:   Comments: Pt is making expected progress toward Pulmonary Rehab goals after completing 9 session(s). Recommend continued exercise, life style modification, education, and utilization of breathing techniques to increase stamina and strength, while also decreasing shortness of breath with exertion.  Dr. Slater Staff is Medical Director for Pulmonary Rehab at [facility].

## 2024-06-06 ENCOUNTER — Encounter (HOSPITAL_COMMUNITY)
Admission: RE | Admit: 2024-06-06 | Discharge: 2024-06-06 | Disposition: A | Discharge status: Home / Self Care | Source: Ambulatory Visit | Attending: Student in an Organized Health Care Education/Training Program | Admitting: Student in an Organized Health Care Education/Training Program

## 2024-06-06 DIAGNOSIS — C3432 Malignant neoplasm of lower lobe, left bronchus or lung: Secondary | ICD-10-CM

## 2024-06-06 DIAGNOSIS — I5042 Chronic combined systolic (congestive) and diastolic (congestive) heart failure: Secondary | ICD-10-CM

## 2024-06-06 NOTE — Progress Notes (Signed)
 Daily Session Note  Patient Details  Name: Brett [name] MRN: [id number] Date of Birth: 1956/03/07 Referring Provider:   [name] Pulmonary Rehab Walk Test from 04/26/2024 in [facility]  Referring Provider [name]    Encounter Date: 06/06/2024  Check In:  Session Check In - 06/06/24 1039       Check-In   Supervising physician immediately available to respond to emergencies [facility] MD immediately available    Physician(s) Rosaline Bane, NP    Location [facility]    Staff Present [name], [name], RN, BSN;Jemmie Rhinehart Midge BS, ACSM-CEP, Exercise Physiologist;Kaylee Nicholaus, MS, ACSM-CEP, Exercise Physiologist    Virtual Visit No    Medication changes reported     No    Fall or balance concerns reported    No    Tobacco Cessation No Change    Warm-up and Cool-down Performed as group-led instruction    Resistance Training Performed Yes    VAD Patient? No    PAD/SET Patient? No      Pain Assessment   Currently in Pain? No/denies          Capillary Blood Glucose: No results found for this or any previous visit (from the past 24 hours).    Social History   Tobacco Use  Smoking Status Former   Current packs/day: 0.00   Average packs/day: 0.5 packs/day for 50.3 years (25.2 ttl pk-yrs)   Types: Cigarettes   Start date: 71   Quit date: 12/11/2023   Years since quitting: 0.4  Smokeless Tobacco Never  Tobacco Comments   Started smoking at 68 years old.   Smoked 1 PPD at his heaviest.   Quit smoking on 12/11/2023    Goals Met:  Independence with exercise equipment Exercise tolerated well No report of concerns or symptoms today Strength training completed today  Goals Unmet:  Not Applicable  Comments: Service time is from 1015 to 1145.    Dr. Slater Staff is Medical Director for Pulmonary Rehab at [facility].

## 2024-06-07 ENCOUNTER — Ambulatory Visit: Payer: Self-pay | Admitting: Family

## 2024-06-07 ENCOUNTER — Ambulatory Visit: Admitting: Family

## 2024-06-07 VITALS — BP 125/51 | HR 65 | Temp 98.0°F | Resp 16 | Ht 74.0 in | Wt 176.6 lb

## 2024-06-07 DIAGNOSIS — N481 Balanitis: Secondary | ICD-10-CM | POA: Diagnosis not present

## 2024-06-07 DIAGNOSIS — M545 Low back pain, unspecified: Secondary | ICD-10-CM | POA: Diagnosis not present

## 2024-06-07 DIAGNOSIS — I429 Cardiomyopathy, unspecified: Secondary | ICD-10-CM | POA: Diagnosis not present

## 2024-06-07 DIAGNOSIS — C3432 Malignant neoplasm of lower lobe, left bronchus or lung: Secondary | ICD-10-CM

## 2024-06-07 DIAGNOSIS — J439 Emphysema, unspecified: Secondary | ICD-10-CM

## 2024-06-07 DIAGNOSIS — M0580 Other rheumatoid arthritis with rheumatoid factor of unspecified site: Secondary | ICD-10-CM

## 2024-06-07 DIAGNOSIS — I4891 Unspecified atrial fibrillation: Secondary | ICD-10-CM

## 2024-06-07 LAB — BASIC METABOLIC PANEL WITH GFR
BUN: 22 mg/dL (ref 6–23)
CO2: 32 meq/L (ref 19–32)
Calcium: 9.2 mg/dL (ref 8.4–10.5)
Chloride: 97 meq/L (ref 96–112)
Creatinine, Ser: 1.23 mg/dL (ref 0.40–1.50)
GFR: 59.71 mL/min — ABNORMAL LOW (ref >60.00)
Glucose, Bld: 90 mg/dL (ref 70–99)
Potassium: 4.5 meq/L (ref 3.5–5.1)
Sodium: 138 meq/L (ref 135–145)

## 2024-06-07 NOTE — Progress Notes (Signed)
 Subjective:     Patient ID: Brett [name], male    DOB: [birth year], 68 y.o.   MRN: [id number]  Chief Complaint  Patient presents with   Follow-up    HPI  Discussed the use of AI scribe software for clinical note transcription with the patient, who gave verbal consent to proceed.  History of Present Illness  Brett [name] is a 68 year old male with chronic heart failure and COPD who presents with persistent ankle swelling and shortness of breath.  He experiences persistent ankle swelling, particularly in the left ankle, which worsens throughout the day. Despite taking diuretics as prescribed, the swelling persists, and he is uncertain about adjusting the dosage.  He experiences shortness of breath, especially with exertion or when lying down. Episodes of chest pressure and dyspnea resolve after resting for 5-10 minutes. These symptoms also occur during laughter or excitement.  He is on oxygen  therapy, using a level four setting when walking and a level two when sitting. His oxygen  saturation drops significantly during exertion but recovers quickly with rest. He is participating in lung rehabilitation. His medications include Jardiance , Eliquis , and metoprolol .  He has a history of atrial fibrillation and lung surgery, contributing to his respiratory symptoms. He experiences night sweats and has scarring and inflammation in the left lung base, but no definite pneumonia.  He has a history of a groin rash, improved with prescribed cream, and occasional flare-ups attributed to Jardiance . He experiences joint pain, with an elevated rheumatoid factor, and manages pain with oxycodone .     Health Maintenance Due  Topic Date Due   Medicare Annual Wellness (AWV)  02/16/2024    Past Medical History:  Diagnosis Date   Allergy    Arthritis    Asthma    Bilateral low back pain without sciatica 09/22/2015   Blood transfusion without reported diagnosis    Cancer (HCC)     Left Lower Lobe Adenocarcinoma   Cardiomyopathy (HCC)    Cervical neck pain with evidence of disc disease 09/22/2015   Chicken pox    Congenital hip deformity    Corn of foot 08/09/2022   Degenerative disc disease, cervical 01/29/2015   Dysrhythmia    SVT   Eczema 05/30/2013   Erectile dysfunction 09/22/2015   Glaucoma 05/30/2013   History of cardiac dysrhythmia 05/23/2023   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 08/06/2023   Pain of left hip joint 11/01/2018   Paroxysmal SVT (supraventricular tachycardia) (HCC), incidental finding IntraOp back surgery requiring esmolol ; event monitor November 2024 for short runs, longest episode 17 beats. 06/19/2023   Event monitor November 2024, supraventricular ectopy burden 1.7%.  4 short runs of SVT, longest episode 17 beats.  No symptoms reported     Personal history of congenital hip dysplasia 05/30/2013   Preventative health care 01/29/2015   Rhinitis, allergic 09/22/2015   Rosacea 05/30/2013   Scoliosis deformity of spine 05/05/2020   Spinal stenosis of lumbar region 06/16/2023   Spondylolisthesis at L5-S1 level 09/14/2020   Tobacco abuse 05/30/2013    Past Surgical History:  Procedure Laterality Date   ANTERIOR LAT LUMBAR FUSION Right 05/09/2023   Procedure: Extreme Lateral Interbody Fusion Lumbar three-four -right;  Surgeon: Louis,  Victory, MD;  Location: [facility] OR;  Service: Neurosurgery;  Laterality: Right;  3C   BRONCHOSCOPY, WITH BIOPSY USING ELECTROMAGNETIC NAVIGATION Bilateral 01/09/2024   Procedure: ROBOTIC ASSISTED NAVIGATIONAL BRONCHOSCOPY;  Surgeon: Isadora Hose, MD;  Location: [facility] ORS;  Service: Pulmonary;  Laterality:  Bilateral;   COLONOSCOPY  06/17/2015   Pyrtle   COLONOSCOPY  07/20/2020   Pyrtle   COLONOSCOPY  02/15/2024   ENDOBRONCHIAL ULTRASOUND Bilateral 01/09/2024   Procedure: ENDOBRONCHIAL ULTRASOUND (EBUS);  Surgeon: Isadora Hose, MD;  Location: [facility] ORS;  Service: Pulmonary;  Laterality: Bilateral;   HAMMER TOE SURGERY  07/28/2011   Procedure: HAMMER TOE CORRECTION;  Surgeon: Toribio JULIANNA Chancy, MD;  Location: [facility];  Service: Orthopedics;  Laterality: Right;  right 2nd and 4th toes correction hammer toe, capsulotomy metatarsal-phalangeal joints   HERNIA REPAIR Bilateral    HIP SURGERY  1986 & 2010   rt total hip-8/10-multiple rt hip surgeries- had 4 prior to 1986 as a child   INTERCOSTAL NERVE BLOCK Left 02/19/2024   Procedure: BLOCK, NERVE, INTERCOSTAL;  Surgeon: Kerrin Elspeth BROCKS, MD;  Location: [facility] OR;  Service: Thoracic;  Laterality: Left;   JOINT REPLACEMENT  2010   Partial Right Hip   LOBECTOMY, LUNG, ROBOT-ASSISTED, USING VATS Left 02/19/2024   Procedure: LOBECTOMY, LUNG, ROBOT-ASSISTED, USING VATS;  Surgeon: Kerrin Elspeth BROCKS, MD;  Location: [facility] OR;  Service: Thoracic;  Laterality: Left;  ROBOTIC LEFT LOWER LOBECTOMY   lumber fusion  09/2020   POLYPECTOMY     RIGHT/LEFT HEART CATH AND CORONARY ANGIOGRAPHY N/A 05/10/2024   Procedure: RIGHT/LEFT HEART CATH AND CORONARY ANGIOGRAPHY;  Surgeon: Wonda Sharper, MD;  Location: [facility] INVASIVE CV LAB;  Service: Cardiovascular;  Laterality: N/A;   SENTINEL NODE BIOPSY Left 02/19/2024   Procedure: BIOPSY, LYMPH NODE;  Surgeon: Kerrin Elspeth BROCKS, MD;  Location: [facility] OR;  Service: Thoracic;  Laterality: Left;   SPINE SURGERY  09/14/2020   THUMB ARTHROSCOPY  2008   rt   TOTAL HIP ARTHROPLASTY Right 1985   VIDEO BRONCHOSCOPY N/A 02/23/2024   Procedure: VIDEO BRONCHOSCOPY WITHOUT FLUORO;  Surgeon: Kerrin Elspeth BROCKS, MD;  Location: [facility] ENDOSCOPY;  Service: Thoracic;  Laterality: N/A;  Bronch with IBV insertion   VIDEO  BRONCHOSCOPY WITH INSERTION OF INTERBRONCHIAL VALVE (IBV) N/A 02/23/2024   Procedure: BRONCHOSCOPY, FLEXIBLE, WITH INTRABRONCHIAL VALVE INSERTION;  Surgeon: Kerrin Elspeth BROCKS, MD;  Location: [facility] ENDOSCOPY;  Service: Thoracic;  Laterality: N/A;    Family History  Problem Relation Age of Onset   Cancer Mother 71       history of colon cancer   Rosacea Mother    Colon cancer Mother 74   Colon polyps Mother    Rosacea Father    Lung disease Father        ?pulmonary fibrosis   Alcohol abuse Brother    Depression Brother    Colon cancer Maternal Grandmother    Endocrine tumor Daughter        pituitary tumor, POTTS   Thyroid  disease Son        ?hyperthyroid   Cancer Cousin        colon   Colon cancer Cousin    Esophageal cancer Neg Hx    Rectal cancer Neg Hx    Stomach cancer Neg Hx     Social History   Socioeconomic History   Marital status: Widowed    Spouse name: Not on file   Number of children: Not on file   Years of education: Not on  file   Highest education level: Associate degree: occupational, scientist, product/process development, or vocational program  Occupational History   Not on file  Tobacco Use   Smoking status: Former    Current packs/day: 0.00    Average packs/day: 0.5 packs/day for 50.3 years (25.2 ttl pk-yrs)    Types: Cigarettes    Start date: 35    Quit date: 12/11/2023    Years since quitting: 0.4   Smokeless tobacco: Never   Tobacco comments:    Started smoking at 68 years old.    Smoked 1 PPD at his heaviest.    Quit smoking on 12/11/2023  Vaping Use   Vaping status: Some Days  Substance and Sexual Activity   Alcohol use: Yes    Alcohol/week: 14.0 standard drinks of alcohol    Types: 14 Standard drinks or equivalent per week   Drug use: No   Sexual activity: Not Currently    Birth control/protection: None  Other Topics Concern   Not on file  Social History Narrative   Quality control Tech- gauges/callibrations.  (Retired)   Some college/tech school   wife  passed   3 grown children (oldest daughter is living with them) youngest daughter lives in [place].  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  Son lives near [place]- framing/art.   Social Drivers of Corporate Investment Banker Strain: Low Risk  (05/31/2024)   Overall Financial Resource Strain (CARDIA)    Difficulty of Paying Living Expenses: Not very hard  Food Insecurity: Unknown (05/31/2024)   Hunger Vital Sign    Worried About Running Out of Food in the Last Year: Never true    Ran Out of Food in the Last Year: Not on file  Transportation Needs: No Transportation Needs (05/31/2024)   PRAPARE - Administrator, Civil Service (Medical): No    Lack of Transportation (Non-Medical): No  Physical Activity: Insufficiently Active (05/31/2024)   Exercise Vital Sign    Days of Exercise per Week: 2 days    Minutes of Exercise per Session: 30 min  Stress: No Stress Concern Present (05/31/2024)   [employer] of Occupational Health - Occupational Stress Questionnaire    Feeling of Stress: Only a little  Social Connections: Moderately Integrated (05/31/2024)   Social Connection and Isolation Panel    Frequency of Communication with Friends and Family: More than three times a week    Frequency of Social Gatherings with Friends and Family: Once a week    Attends Religious Services: 1 to 4 times per year    Active Member of [employer] or Organizations: Yes    Attends Banker Meetings: 1 to 4 times per year    Marital Status: Widowed  Intimate Partner Violence: Not At Risk (03/04/2024)   Humiliation, Afraid, Rape, and Kick questionnaire    Fear of Current or Ex-Partner: No    Emotionally Abused: No    Physically Abused: No    Sexually Abused: No    Outpatient Medications Prior to Visit  Medication Sig Dispense Refill   acetaminophen  (TYLENOL ) 500 MG tablet Take 500 mg by mouth every 6 (six) hours as needed for moderate pain (pain score 4-6).     apixaban  (ELIQUIS ) 5 MG TABS  tablet Take 1 tablet (5 mg total) by mouth 2 (two) times daily. 180 tablet 3   aspirin  EC 81 MG tablet Take 1 tablet (81 mg total) by mouth daily. Swallow whole.     calcium  carbonate (TUMS EX) 750 MG chewable tablet  Chew 1-2 tablets by mouth 2 (two) times daily as needed for heartburn.     Clotrimazole  1 % OINT Apply to affected area twice daily as needed 56.7 g 0   empagliflozin  (JARDIANCE ) 10 MG TABS tablet Take 1 tablet (10 mg total) by mouth daily before breakfast. 90 tablet 1   fluticasone -salmeterol (WIXELA INHUB) 100-50 MCG/ACT AEPB Inhale 1 puff into the lungs 2 (two) times daily. 60 each 11   furosemide  (LASIX ) 20 MG tablet Take 1 tablet (20 mg total) by mouth as directed. Take 20 mg daily and an additional 20 mg every other day 135 tablet 3   ibuprofen  (ADVIL ) 200 MG tablet Take 200 mg by mouth every 6 (six) hours as needed for moderate pain (pain score 4-6).     metoprolol  succinate (TOPROL  XL) 25 MG 24 hr tablet Take 1 tablet (25 mg total) by mouth daily. 90 tablet 3   Multiple Vitamin (MULTIVITAMIN WITH MINERALS) TABS tablet Take 1 tablet by mouth in the morning.     Oxycodone  HCl 10 MG TABS Take 1 tablet by mouth every 6 (six) hours as needed.     OXYGEN  Inhale 2 L/min into the lungs continuous.     STELARA 45 MG/0.5ML injection Inject 45 mg into the skin as directed. Every 12 weeks     Timolol  Maleate, Once-Daily, 0.5 % SOLN Place 1 drop into both eyes in the morning and at bedtime.     TRAVATAN Z 0.004 % SOLN ophthalmic solution Place 1 drop into both eyes every morning.  1   No facility-administered medications prior to visit.    No Known Allergies  ROS    See HPI Objective:    Physical Exam Constitutional:      General: He is not in acute distress.    Appearance: He is well-developed.  HENT:     Head: Normocephalic and atraumatic.  Cardiovascular:     Rate and Rhythm: Normal rate and regular rhythm.     Heart sounds: No murmur heard. Pulmonary:     Effort:  Pulmonary effort is normal. No respiratory distress.     Breath sounds: No decreased breath sounds, wheezing or rales.     Comments: Wearing oxygen  4 L via nasal cannula Musculoskeletal:     Right lower leg: 1+ Edema present.     Left lower leg: 1+ Edema present.  Skin:    General: Skin is warm and dry.  Neurological:     Mental Status: He is alert and oriented to person, place, and time.  Psychiatric:        Behavior: Behavior normal.        Thought Content: Thought content normal.      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      Polyarthritis with positive rheumatoid factor [facility])   Awaiting Rheumatology consultation.       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        Cardiomyopathy (HCC), reduced LVEF on cardiac PET imaging - Primary   Discussed cardiac MRI and counseled on CHF management.  Clinically euvolemic on lasix  40mg  alternating with 20mg  once daily as well as Jardiance .       Relevant Orders   Basic Metabolic Panel (BMET)   Bilateral low back pain without sciatica   This is being managed with oxycodone .       Balanitis   Resolved with clotrimazole  cream, continue prn. Was likely result of jardiance .       Atrial fibrillation (HCC)   Rate is stable, continues Eliquis  for  stroke prevention.        I am having Niranjan S. Sampsel Bob maintain his Travatan Z, calcium  carbonate, Stelara, metoprolol  succinate, aspirin  EC, empagliflozin , Timolol  Maleate (Once-Daily), multivitamin with minerals, OXYGEN , Oxycodone  HCl, Clotrimazole , fluticasone -salmeterol, apixaban , furosemide , ibuprofen , and acetaminophen .  No orders of the defined types were placed in this encounter.

## 2024-06-07 NOTE — Assessment & Plan Note (Addendum)
 Discussed cardiac MRI and counseled on CHF management.  Clinically euvolemic on lasix  40mg  alternating with 20mg  once daily as well as Jardiance .

## 2024-06-07 NOTE — Assessment & Plan Note (Signed)
 Awaiting Rheumatology consultation.

## 2024-06-07 NOTE — Assessment & Plan Note (Signed)
 Resolved with clotrimazole  cream, continue prn. Was likely result of jardiance .

## 2024-06-07 NOTE — Assessment & Plan Note (Signed)
 Maintained on oxygen  4 L via nasal canula and continues on Wixela.

## 2024-06-07 NOTE — Patient Instructions (Signed)
 VISIT SUMMARY:  Today, we discussed your persistent ankle swelling, shortness of breath, and other ongoing health issues. We reviewed your current medications and therapies, and made plans for continued management and follow-up.  YOUR PLAN:  HEART FAILURE WITH REDUCED EJECTION FRACTION WITH LOWER EXTREMITY EDEMA: You have chronic heart failure with a reduced ejection fraction, which is causing swelling in your lower legs and shortness of breath. -Continue your current cardiac therapy as advised by your cardiologist. -We will check your potassium and kidney function due to your diuretic use.  ATRIAL FIBRILLATION ON ANTICOAGULATION: You have chronic atrial fibrillation and are taking Eliquis  to prevent stroke. -Continue taking Eliquis  as prescribed.  CHRONIC OBSTRUCTIVE PULMONARY DISEASE AND CHRONIC RESPIRATORY FAILURE WITH HYPOXIA, STATUS POST PARTIAL LUNG RESECTION: You have chronic respiratory failure and COPD, requiring oxygen  therapy, especially during exertion. -Continue using your oxygen  therapy, -Continue participating in pulmonary rehabilitation.  RHEUMATOID ARTHRITIS (SUSPECTED): You have joint pain and an elevated rheumatoid factor, and we suspect rheumatoid arthritis. -We are awaiting your rheumatology appointment for further evaluation and management.  RECURRENT GROIN CANDIDIASIS: You have recurrent groin candidiasis, likely worsened by Jardiance . -Continue using the antifungal cream as needed for symptoms.  GENERAL HEALTH MAINTENANCE: You are up to date with your flu and COVID vaccinations and are engaged in pulmonary rehabilitation and health spa activities. -Continue with your pulmonary rehabilitation and health spa activities.  FOLLOW-UP: We need to monitor your chronic conditions regularly. -Schedule a follow-up appointment in three months.

## 2024-06-07 NOTE — Assessment & Plan Note (Signed)
 Rate is stable, continues Eliquis  for stroke prevention.

## 2024-06-07 NOTE — Assessment & Plan Note (Signed)
 This is being managed with oxycodone .

## 2024-06-07 NOTE — Assessment & Plan Note (Signed)
  History of stage 1A non-small cell lung adenocarcinoma, status post left lower lobectomy- no chemotherapy needed due to early staging.

## 2024-06-11 ENCOUNTER — Encounter (HOSPITAL_COMMUNITY)
Admission: RE | Admit: 2024-06-11 | Discharge: 2024-06-11 | Disposition: A | Discharge status: Home / Self Care | Source: Ambulatory Visit | Attending: Student in an Organized Health Care Education/Training Program | Admitting: Student in an Organized Health Care Education/Training Program

## 2024-06-11 ENCOUNTER — Other Ambulatory Visit: Payer: Self-pay | Admitting: Physician Assistant

## 2024-06-11 DIAGNOSIS — C3432 Malignant neoplasm of lower lobe, left bronchus or lung: Secondary | ICD-10-CM | POA: Insufficient documentation

## 2024-06-11 DIAGNOSIS — I5042 Chronic combined systolic (congestive) and diastolic (congestive) heart failure: Secondary | ICD-10-CM | POA: Insufficient documentation

## 2024-06-11 NOTE — Telephone Encounter (Signed)
 I  have faxed the O2 order from 04/17/2024 to [facility] Attn: Karleen Rover

## 2024-06-11 NOTE — Progress Notes (Signed)
 Daily Session Note  Patient Details  Name: Brett [name] MRN: [id number] Date of Birth: 03/28/1956 Referring Provider:   [name] Pulmonary Rehab Walk Test from 04/26/2024 in [facility]  Referring Provider [name]    Encounter Date: 06/11/2024  Check In:  Session Check In - 06/11/24 1033       Check-In   Supervising physician immediately available to respond to emergencies [facility] MD immediately available    Physician(s) Rosaline Bane, NP    Location [facility]    Staff Present [name], [name], RN, BSN;Randi Midge BS, ACSM-CEP, Exercise Physiologist;Kaylee Nicholaus, MS, ACSM-CEP, Exercise Physiologist    Virtual Visit No    Medication changes reported     No    Fall or balance concerns reported    No    Tobacco Cessation No Change    Warm-up and Cool-down Performed as group-led instruction    Resistance Training Performed Yes    VAD Patient? No    PAD/SET Patient? No      Pain Assessment   Currently in Pain? No/denies          Capillary Blood Glucose: No results found for this or any previous visit (from the past 24 hours).    Social History   Tobacco Use  Smoking Status Former   Current packs/day: 0.00   Average packs/day: 0.5 packs/day for 50.3 years (25.2 ttl pk-yrs)   Types: Cigarettes   Start date: 57   Quit date: 12/11/2023   Years since quitting: 0.5  Smokeless Tobacco Never  Tobacco Comments   Started smoking at 68 years old.   Smoked 1 PPD at his heaviest.   Quit smoking on 12/11/2023    Goals Met:  Exercise tolerated well Queuing for purse lip breathing No report of concerns or symptoms today Strength training completed today  Goals Unmet:  Not Applicable  Comments: Service time is from 1014 to 1137    Dr. Slater Staff is Medical Director for Pulmonary Rehab at [facility].

## 2024-06-13 ENCOUNTER — Encounter (HOSPITAL_COMMUNITY)
Admission: RE | Admit: 2024-06-13 | Discharge: 2024-06-13 | Disposition: A | Discharge status: Home / Self Care | Source: Ambulatory Visit | Attending: Student in an Organized Health Care Education/Training Program | Admitting: Student in an Organized Health Care Education/Training Program

## 2024-06-13 DIAGNOSIS — C3432 Malignant neoplasm of lower lobe, left bronchus or lung: Secondary | ICD-10-CM | POA: Diagnosis not present

## 2024-06-13 DIAGNOSIS — I5042 Chronic combined systolic (congestive) and diastolic (congestive) heart failure: Secondary | ICD-10-CM

## 2024-06-13 NOTE — Progress Notes (Signed)
 Daily Session Note  Patient Details  Name: Brett [name] MRN: [id number] Date of Birth: 26-Dec-1955 Referring Provider:   [name] Pulmonary Rehab Walk Test from 04/26/2024 in [facility]  Referring Provider [name]    Encounter Date: 06/13/2024  Check In:  Session Check In - 06/13/24 1129       Check-In   Supervising physician immediately available to respond to emergencies [facility] MD immediately available    Physician(s) Orren Fabry, NP    [facility]    Staff Present [name], Neita Moats, MS, ACSM-CEP, Exercise Physiologist;Carlette Bernett, RN, Mallory Parkins, MS, ACSM-CEP, CCRP, Exercise Physiologist;Maria Whitaker, RN, BSN    Virtual Visit No    Medication changes reported     No    Fall or balance concerns reported    No    Tobacco Cessation No Change    Warm-up and Cool-down Performed as group-led instruction    Resistance Training Performed Yes    VAD Patient? No    PAD/SET Patient? No      Pain Assessment   Currently in Pain? No/denies    Multiple Pain Sites No          Capillary Blood Glucose: No results found for this or any previous visit (from the past 24 hours).    Social History   Tobacco Use  Smoking Status Former   Current packs/day: 0.00   Average packs/day: 0.5 packs/day for 50.3 years (25.2 ttl pk-yrs)   Types: Cigarettes   Start date: 54   Quit date: 12/11/2023   Years since quitting: 0.5  Smokeless Tobacco Never  Tobacco Comments   Started smoking at 68 years old.   Smoked 1 PPD at his heaviest.   Quit smoking on 12/11/2023    Goals Met:  Proper associated with RPD/PD & O2 Sat Independence with exercise equipment Exercise tolerated well No report of concerns or symptoms today Strength training completed today  Goals Unmet:  Not Applicable  Comments: Service time is from 1027 to 1138.    Dr. Slater Staff is Medical Director  for Pulmonary Rehab at [facility].

## 2024-06-18 ENCOUNTER — Encounter (HOSPITAL_COMMUNITY)
Admission: RE | Admit: 2024-06-18 | Discharge: 2024-06-18 | Disposition: A | Discharge status: Home / Self Care | Source: Ambulatory Visit | Attending: Student in an Organized Health Care Education/Training Program | Admitting: Student in an Organized Health Care Education/Training Program

## 2024-06-18 VITALS — Wt 177.0 lb

## 2024-06-18 DIAGNOSIS — I5042 Chronic combined systolic (congestive) and diastolic (congestive) heart failure: Secondary | ICD-10-CM | POA: Diagnosis not present

## 2024-06-18 DIAGNOSIS — C3432 Malignant neoplasm of lower lobe, left bronchus or lung: Secondary | ICD-10-CM

## 2024-06-18 NOTE — Progress Notes (Signed)
 Daily Session Note  Patient Details  Name: Brett [name] MRN: [id number] Date of Birth: 1956-02-26 Referring Provider:   [name] Pulmonary Rehab Walk Test from 04/26/2024 in [facility], [facility], & Lung Health  Referring Provider [name]    Encounter Date: 06/18/2024  Check In:  Session Check In - 06/18/24 1026       Check-In   Supervising physician immediately available to respond to emergencies [facility] MD immediately available    Physician(s) Barnie Press, NP    [facility]    Staff Present [name], RT;Randi Midge BS, ACSM-CEP, Exercise Physiologist;Glessie Eustice Harvy, RN, Maud Moats, MS, ACSM-CEP, Exercise Physiologist    Virtual Visit No    Medication changes reported     No    Fall or balance concerns reported    No    Tobacco Cessation No Change    Warm-up and Cool-down Performed as group-led instruction    Resistance Training Performed Yes    VAD Patient? No    PAD/SET Patient? No      Pain Assessment   Currently in Pain? No/denies          Capillary Blood Glucose: No results found for this or any previous visit (from the past 24 hours).   Exercise Prescription Changes - 06/18/24 1200       Response to Exercise   Blood Pressure (Admit) 110/62    Blood Pressure (Exercise) 118/70    Blood Pressure (Exit) 96/52    Heart Rate (Admit) 71 bpm    Heart Rate (Exercise) 71 bpm    Heart Rate (Exit) 69 bpm    Oxygen  Saturation (Admit) 96 %   4L   Oxygen  Saturation (Exercise) 94 %   4L   Oxygen  Saturation (Exit) 100 %   4K   Rating of Perceived Exertion (Exercise) 13    Perceived Dyspnea (Exercise) 2.5    Duration Continue with 30 min of aerobic exercise without signs/symptoms of physical distress.    Intensity THRR unchanged      Progression   Progression Continue to progress workloads to maintain intensity without signs/symptoms of physical distress.      Resistance Training    Training Prescription Yes    Weight red bands    Reps 10-15    Time 10 Minutes      Oxygen    Oxygen  Continuous    Liters 4      NuStep   Level 3    Minutes 30    METs 1.9      Oxygen    Maintain Oxygen  Saturation 88% or higher          Social History   Tobacco Use  Smoking Status Former   Current packs/day: 0.00   Average packs/day: 0.5 packs/day for 50.3 years (25.2 ttl pk-yrs)   Types: Cigarettes   Start date: 14   Quit date: 12/11/2023   Years since quitting: 0.5  Smokeless Tobacco Never  Tobacco Comments   Started smoking at 68 years old.   Smoked 1 PPD at his heaviest.   Quit smoking on 12/11/2023    Goals Met:  Exercise tolerated well Queuing for purse lip breathing No report of concerns or symptoms today Strength training completed today  Goals Unmet:  Not Applicable  Comments: Service time is from 1014 to 1136    Dr. Slater Staff is Medical Director for Pulmonary Rehab at [facility].

## 2024-06-19 DIAGNOSIS — Z6821 Body mass index (BMI) 21.0-21.9, adult: Secondary | ICD-10-CM | POA: Diagnosis not present

## 2024-06-19 DIAGNOSIS — R030 Elevated blood-pressure reading, without diagnosis of hypertension: Secondary | ICD-10-CM | POA: Diagnosis not present

## 2024-06-19 DIAGNOSIS — Z79899 Other long term (current) drug therapy: Secondary | ICD-10-CM | POA: Diagnosis not present

## 2024-06-19 DIAGNOSIS — M5136 Other intervertebral disc degeneration, lumbar region with discogenic back pain only: Secondary | ICD-10-CM | POA: Diagnosis not present

## 2024-06-20 ENCOUNTER — Encounter (HOSPITAL_COMMUNITY)
Admission: RE | Admit: 2024-06-20 | Discharge: 2024-06-20 | Disposition: A | Discharge status: Home / Self Care | Source: Ambulatory Visit | Attending: Student in an Organized Health Care Education/Training Program | Admitting: Student in an Organized Health Care Education/Training Program

## 2024-06-20 DIAGNOSIS — C3432 Malignant neoplasm of lower lobe, left bronchus or lung: Secondary | ICD-10-CM | POA: Diagnosis not present

## 2024-06-20 DIAGNOSIS — I5042 Chronic combined systolic (congestive) and diastolic (congestive) heart failure: Secondary | ICD-10-CM | POA: Diagnosis not present

## 2024-06-20 NOTE — Progress Notes (Signed)
 Daily Session Note  Patient Details  Name: Brett [name] MRN: [id number] Date of Birth: 1956-03-01 Referring Provider:   [name] Pulmonary Rehab Walk Test from 04/26/2024 in [facility]  Referring Provider [name]    Encounter Date: 06/20/2024  Check In:  Session Check In - 06/20/24 1041       Check-In   Supervising physician immediately available to respond to emergencies [facility] MD immediately available    Physician(s) Josefa Beauvais, NP    Location [facility] (P)     Staff Present [name], RT;Randi Midge BS, ACSM-CEP, Exercise Physiologist;Mary Bastin, RN, Maud Moats, MS, ACSM-CEP, Exercise Physiologist (P)     Virtual Visit No (P)     Medication changes reported     No (P)     Fall or balance concerns reported    No (P)     Tobacco Cessation No Change (P)     Warm-up and Cool-down Performed as group-led instruction (P)     Resistance Training Performed Yes (P)     VAD Patient? No (P)     PAD/SET Patient? No      Pain Assessment   Currently in Pain? No/denies    Multiple Pain Sites No          Capillary Blood Glucose: No results found for this or any previous visit (from the past 24 hours).    Social History   Tobacco Use  Smoking Status Former   Current packs/day: 0.00   Average packs/day: 0.5 packs/day for 50.3 years (25.2 ttl pk-yrs)   Types: Cigarettes   Start date: 12   Quit date: 12/11/2023   Years since quitting: 0.5  Smokeless Tobacco Never  Tobacco Comments   Started smoking at 68 years old.   Smoked 1 PPD at his heaviest.   Quit smoking on 12/11/2023    Goals Met:  Proper associated with RPD/PD & O2 Sat Independence with exercise equipment Exercise tolerated well No report of concerns or symptoms today Strength training completed today  Goals Unmet:  Not Applicable  Comments: Service time is from 1010 to 1143.    Dr. Slater Staff is Medical  Director for Pulmonary Rehab at [facility].

## 2024-06-25 ENCOUNTER — Encounter (HOSPITAL_COMMUNITY)
Admission: RE | Admit: 2024-06-25 | Discharge: 2024-06-25 | Disposition: A | Source: Ambulatory Visit | Attending: Student in an Organized Health Care Education/Training Program

## 2024-06-25 DIAGNOSIS — I5042 Chronic combined systolic (congestive) and diastolic (congestive) heart failure: Secondary | ICD-10-CM | POA: Diagnosis not present

## 2024-06-25 DIAGNOSIS — C3432 Malignant neoplasm of lower lobe, left bronchus or lung: Secondary | ICD-10-CM | POA: Diagnosis not present

## 2024-06-25 NOTE — Progress Notes (Signed)
 Daily Session Note  Patient Details  Name: Brett [name] MRN: [id number] Date of Birth: 1956-07-21 Referring Provider:   [name] Pulmonary Rehab Walk Test from 04/26/2024 in [facility], [facility], [facility]  Referring Provider [name]    Encounter Date: 06/25/2024  Check In:  Session Check In - 06/25/24 1120       Check-In   Supervising physician immediately available to respond to emergencies [facility] MD immediately available    Physician(s) Josefa Beauvais, NP    [facility]    Staff Present [name], RT;Randi Midge BS, ACSM-CEP, Exercise Physiologist;Mary Harvy, RN, Maud Moats, MS, ACSM-CEP, Exercise Physiologist;Maria Whitaker, RN, BSN    Virtual Visit No    Medication changes reported     No    Fall or balance concerns reported    No    Tobacco Cessation No Change    Warm-up and Cool-down Performed as group-led instruction    Resistance Training Performed Yes    VAD Patient? No    PAD/SET Patient? No      Pain Assessment   Currently in Pain? No/denies    Multiple Pain Sites No          Capillary Blood Glucose: No results found for this or any previous visit (from the past 24 hours).    Social History   Tobacco Use  Smoking Status Former   Current packs/day: 0.00   Average packs/day: 0.5 packs/day for 50.3 years (25.2 ttl pk-yrs)   Types: Cigarettes   Start date: 59   Quit date: 12/11/2023   Years since quitting: 0.5  Smokeless Tobacco Never  Tobacco Comments   Started smoking at 68 years old.   Smoked 1 PPD at his heaviest.   Quit smoking on 12/11/2023    Goals Met:  Proper associated with RPD/PD & O2 Sat Independence with exercise equipment Exercise tolerated well No report of concerns or symptoms today Strength training completed today  Goals Unmet:  Not Applicable  Comments: Service time is from 1121 to 1135.    Dr. Slater Staff is Medical Director for Pulmonary  Rehab at [facility].

## 2024-06-27 ENCOUNTER — Encounter (HOSPITAL_COMMUNITY)
Admission: RE | Admit: 2024-06-27 | Discharge: 2024-06-27 | Disposition: A | Source: Ambulatory Visit | Attending: Student in an Organized Health Care Education/Training Program

## 2024-06-27 DIAGNOSIS — C3432 Malignant neoplasm of lower lobe, left bronchus or lung: Secondary | ICD-10-CM

## 2024-06-27 DIAGNOSIS — I5042 Chronic combined systolic (congestive) and diastolic (congestive) heart failure: Secondary | ICD-10-CM

## 2024-06-27 NOTE — Progress Notes (Signed)
 Daily Session Note  Patient Details  Name: Brett [name] MRN: [id number] Date of Birth: 26-Mar-1956 Referring Provider:   [name] Pulmonary Rehab Walk Test from 04/26/2024 in [facility] for [facility], [facility], [facility]  Referring Provider [name]    Encounter Date: 06/27/2024  Check In:  Session Check In - 06/27/24 1051       Check-In   Supervising physician immediately available to respond to emergencies [facility] MD immediately available    Physician(s) Orren Fabry, NP    [facility]    Staff Present [name], RT;Randi Midge BS, ACSM-CEP, Exercise Physiologist;Emilianna Barlowe Harvy, RN, Maud Moats, MS, ACSM-CEP, Exercise Physiologist    Virtual Visit No    Medication changes reported     No    Fall or balance concerns reported    No    Tobacco Cessation No Change    Warm-up and Cool-down Performed as group-led instruction    Resistance Training Performed Yes    VAD Patient? No    PAD/SET Patient? No      Pain Assessment   Currently in Pain? No/denies    Multiple Pain Sites No          Capillary Blood Glucose: No results found for this or any previous visit (from the past 24 hours).    Social History   Tobacco Use  Smoking Status Former   Current packs/day: 0.00   Average packs/day: 0.5 packs/day for 50.3 years (25.2 ttl pk-yrs)   Types: Cigarettes   Start date: 57   Quit date: 12/11/2023   Years since quitting: 0.5  Smokeless Tobacco Never  Tobacco Comments   Started smoking at 68 years old.   Smoked 1 PPD at his heaviest.   Quit smoking on 12/11/2023    Goals Met:  Independence with exercise equipment Exercise tolerated well Queuing for purse lip breathing No report of concerns or symptoms today Strength training completed today  Goals Unmet:  Not Applicable  Comments: Service time is from 1019 to 1140    Dr. Slater Staff is Medical Director for Pulmonary Rehab at [facility].

## 2024-07-01 ENCOUNTER — Other Ambulatory Visit: Payer: Self-pay | Admitting: Pharmacist

## 2024-07-01 ENCOUNTER — Encounter: Payer: Self-pay | Admitting: Pharmacist

## 2024-07-01 DIAGNOSIS — I5042 Chronic combined systolic (congestive) and diastolic (congestive) heart failure: Secondary | ICD-10-CM

## 2024-07-01 DIAGNOSIS — I429 Cardiomyopathy, unspecified: Secondary | ICD-10-CM

## 2024-07-01 MED ORDER — EMPAGLIFLOZIN 10 MG PO TABS: 10.0000 mg | ORAL_TABLET | Freq: Every day | ORAL | 1 refills | Status: AC

## 2024-07-01 NOTE — Progress Notes (Signed)
 Pharmacy Quality Measure Review  This patient is appearing on a report for being at risk of failing the adherence measure for diabetes medications this calendar year. Patient actually does not have a diagnosis of diabetes. He is taking Jardiance  for CHF / cardiomyopathy.   Medication: Jardiance  Last fill date: 04/15/2024 for 30 day supply  Reviewed recent refill history in Dr Annemarie database. Actual last refill date was 06/06/2024 for 30 day supply. Patient has no refills remaining. Next appointment with PCP is 01/27/205 and cardiology 07/10/2024 (but he likely will not have enough Jardiance  to last until this appointment.     Insurance report was not up to date. Patient will need updated Rx before his next appointment with cardiologist. Will request refill update from Dr [name]'s office.   Madelin Ray, PharmD Clinical Pharmacist [facility]

## 2024-07-02 ENCOUNTER — Encounter (HOSPITAL_COMMUNITY)
Admission: RE | Admit: 2024-07-02 | Discharge: 2024-07-02 | Disposition: A | Discharge status: Home / Self Care | Source: Ambulatory Visit | Attending: Student in an Organized Health Care Education/Training Program | Admitting: Student in an Organized Health Care Education/Training Program

## 2024-07-02 VITALS — Wt 178.1 lb

## 2024-07-02 DIAGNOSIS — I5042 Chronic combined systolic (congestive) and diastolic (congestive) heart failure: Secondary | ICD-10-CM

## 2024-07-02 DIAGNOSIS — C3432 Malignant neoplasm of lower lobe, left bronchus or lung: Secondary | ICD-10-CM

## 2024-07-02 NOTE — Progress Notes (Signed)
 Daily Session Note  Patient Details  Name: Brett [name] MRN: [id number] Date of Birth: 1956/04/30 Referring Provider:   [name] Pulmonary Rehab Walk Test from 04/26/2024 in [facility], V[facility], & [facility]  Referring Provider [name]    Encounter Date: 07/02/2024  Check In:  Session Check In - 07/02/24 1034       Check-In   Supervising physician immediately available to respond to emergencies [facility] MD immediately available    Physician(s) Rosabel Mose, NP    [facility]    Staff Present [name], RT;Randi Midge BS, ACSM-CEP, Exercise Physiologist;Mary Harvy, RN, Maud Moats, MS, ACSM-CEP, Exercise Physiologist    Virtual Visit No    Medication changes reported     No    Fall or balance concerns reported    No    Tobacco Cessation No Change    Warm-up and Cool-down Performed as group-led instruction    Resistance Training Performed Yes    VAD Patient? No    PAD/SET Patient? No      Pain Assessment   Currently in Pain? No/denies          Capillary Blood Glucose: No results found for this or any previous visit (from the past 24 hours).   Exercise Prescription Changes - 07/02/24 1200       Response to Exercise   Blood Pressure (Admit) 111/57    Blood Pressure (Exercise) 122/68    Blood Pressure (Exit) 108/60    Heart Rate (Admit) 58 bpm    Heart Rate (Exercise) 71 bpm    Heart Rate (Exit) 66 bpm    Oxygen  Saturation (Admit) 98 %    Oxygen  Saturation (Exercise) 96 %    Oxygen  Saturation (Exit) 100 %    Rating of Perceived Exertion (Exercise) 13    Perceived Dyspnea (Exercise) 2    Duration Continue with 30 min of aerobic exercise without signs/symptoms of physical distress.    Intensity THRR unchanged      Progression   Progression Continue to progress workloads to maintain intensity without signs/symptoms of physical distress.      Resistance Training   Training Prescription Yes     Weight red bands    Reps 10-15    Time 10 Minutes      Oxygen    Oxygen  Continuous    Liters 4      NuStep   Level 3    Minutes 30    METs 1.8      Oxygen    Maintain Oxygen  Saturation 88% or higher          Social History   Tobacco Use  Smoking Status Former   Current packs/day: 0.00   Average packs/day: 0.5 packs/day for 70.3 years (25.2 ttl pk-yrs)   Types: Cigarettes   Start date: 58   Quit date: 12/11/2023   Years since quitting: 0.5  Smokeless Tobacco Never  Tobacco Comments   Started smoking at 68 years old.   Smoked 1 PPD at his heaviest.   Quit smoking on 12/11/2023    Goals Met:  Proper associated with RPD/PD & O2 Sat Independence with exercise equipment Exercise tolerated well No report of concerns or symptoms today Strength training completed today  Goals Unmet:  Not Applicable  Comments: Service time is from 1011 to 1145.    Dr. Slater Staff is Medical Director for Pulmonary Rehab at [facility]

## 2024-07-03 DIAGNOSIS — Z902 Acquired absence of lung [part of]: Secondary | ICD-10-CM | POA: Diagnosis not present

## 2024-07-03 NOTE — Progress Notes (Signed)
 Pulmonary Individual Treatment Plan  Patient Details  Name: Brett [name] MRN: [id number] Date of Birth: June 11, 1956 Referring Provider:   [name] Pulmonary Rehab Walk Test from 04/26/2024 in [facility]  Referring Provider [name]    Initial Encounter Date:  Flowsheet Row Pulmonary Rehab Walk Test from 04/26/2024 in [facility]  Date 04/26/24    Visit Diagnosis: Malignant neoplasm of lower lobe bronchus, left (HCC)  Chronic combined systolic and diastolic heart failure (HCC)  Patient's Home Medications on Admission:   Current Outpatient Medications:    acetaminophen  (TYLENOL ) 500 MG tablet, Take 500 mg by mouth every 6 (six) hours as needed for moderate pain (pain score 4-6)., Disp: , Rfl:    apixaban  (ELIQUIS ) 5 MG TABS tablet, Take 1 tablet (5 mg total) by mouth 2 (two) times daily., Disp: 180 tablet, Rfl: 3   aspirin  EC 81 MG tablet, Take 1 tablet (81 mg total) by mouth daily. Swallow whole., Disp: , Rfl:    calcium  carbonate (TUMS EX) 750 MG chewable tablet, Chew 1-2 tablets by mouth 2 (two) times daily as needed for heartburn., Disp: , Rfl:    Clotrimazole  1 % OINT, Apply to affected area twice daily as needed, Disp: 56.7 g, Rfl: 0   empagliflozin  (JARDIANCE ) 10 MG TABS tablet, Take 1 tablet (10 mg total) by mouth daily before breakfast., Disp: 90 tablet, Rfl: 1   fluticasone -salmeterol (WIXELA INHUB) 100-50 MCG/ACT AEPB, Inhale 1 puff into the lungs 2 (two) times daily., Disp: 60 each, Rfl: 11   furosemide  (LASIX ) 20 MG tablet, Take 1 tablet (20 mg total) by mouth as directed. Take 20 mg daily and an additional 20 mg every other day, Disp: 135 tablet, Rfl: 3   ibuprofen  (ADVIL ) 200 MG tablet, Take 200 mg by mouth every 6 (six) hours as needed for moderate pain (pain score 4-6)., Disp: , Rfl:    metoprolol  succinate (TOPROL  XL) 25 MG 24 hr tablet, Take 1  tablet (25 mg total) by mouth daily., Disp: 90 tablet, Rfl: 3   Multiple Vitamin (MULTIVITAMIN WITH MINERALS) TABS tablet, Take 1 tablet by mouth in the morning., Disp: , Rfl:    Oxycodone  HCl 10 MG TABS, Take 1 tablet by mouth every 6 (six) hours as needed., Disp: , Rfl:    OXYGEN , Inhale 2 L/min into the lungs continuous., Disp: , Rfl:    STELARA 45 MG/0.5ML injection, Inject 45 mg into the skin as directed. Every 12 weeks, Disp: , Rfl:    Timolol  Maleate, Once-Daily, 0.5 % SOLN, Place 1 drop into both eyes in the morning and at bedtime., Disp: , Rfl:    TRAVATAN Z 0.004 % SOLN ophthalmic solution, Place 1 drop into both eyes every morning., Disp: , Rfl: 1  Past Medical History: Past Medical History:  Diagnosis Date   Allergy    Arthritis    Asthma    Bilateral low back pain without sciatica 09/22/2015   Blood transfusion without reported diagnosis    Cancer (HCC)    Left Lower Lobe Adenocarcinoma   Cardiomyopathy (HCC)    Cervical neck pain with evidence of disc disease 09/22/2015   Chicken pox    Congenital hip deformity    Corn of foot 08/09/2022   Degenerative disc disease, cervical 01/29/2015   Dysrhythmia    SVT   Eczema 05/30/2013   Erectile dysfunction 09/22/2015   Glaucoma 05/30/2013  History of cardiac dysrhythmia 05/23/2023   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 08/06/2023   Pain of left hip joint 11/01/2018   Paroxysmal SVT (supraventricular tachycardia) (HCC), incidental finding IntraOp back surgery requiring esmolol ; event monitor November 2024 for short runs, longest episode 17 beats. 06/19/2023   Event monitor November 2024, supraventricular ectopy  burden 1.7%.  4 short runs of SVT, longest episode 17 beats.  No symptoms reported     Personal history of congenital hip dysplasia 05/30/2013   Preventative health care 01/29/2015   Rhinitis, allergic 09/22/2015   Rosacea 05/30/2013   Scoliosis deformity of spine 05/05/2020   Spinal stenosis of lumbar region 06/16/2023   Spondylolisthesis at L5-S1 level 09/14/2020   Tobacco abuse 05/30/2013    Tobacco Use: Social History   Tobacco Use  Smoking Status Former   Current packs/day: 0.00   Average packs/day: 0.5 packs/day for 50.3 years (25.2 ttl pk-yrs)   Types: Cigarettes   Start date: 38   Quit date: 12/11/2023   Years since quitting: 0.5  Smokeless Tobacco Never  Tobacco Comments   Started smoking at 68 years old.   Smoked 1 PPD at his heaviest.   Quit smoking on 12/11/2023    Labs: Review Flowsheet  More data exists      Latest Ref Rng & Units 03/24/2017 04/13/2018 04/19/2019 04/24/2020 05/10/2024  Labs for ITP Cardiac and Pulmonary Rehab  Cholestrol <200 mg/dL 839  821  825  821  -  LDL (calc) mg/dL (calc) 83  77  72  83  -  HDL-C > OR = 40 mg/dL 37.49  08.59  07.69  78  -  Trlycerides <150 mg/dL 25.9  48.9  50.9  79  -  PH, Arterial 7.35 - 7.45 - - - - 7.410   PCO2 arterial 32 - 48 mmHg - - - - 42.1   Bicarbonate 20.0 - 28.0 mmol/L - - - - 26.6  27.4   TCO2 22 - 32 mmol/L - - - - 28  29   O2 Saturation % - - - - 94  68     Details       Multiple values from one day are sorted in reverse-chronological order         Capillary Blood Glucose: Lab Results  Component Value Date   GLUCAP 95 12/22/2023   GLUCAP 93 09/22/2022   GLUCAP 127 (H) 04/09/2008   GLUCAP 116 (H) 04/09/2008     Pulmonary Assessment Scores:  Pulmonary Assessment Scores     Row Name 04/26/24 1052         ADL UCSD   ADL Phase Entry     SOB Score total 88       CAT Score   CAT Score 32       mMRC Score   mMRC Score 3       UCSD: Self-administered rating of dyspnea associated  with activities of daily living (ADLs) 6-point scale (0 = not at all to 5 = maximal or unable to do because of breathlessness)  Scoring Scores range from 0 to 120.  Minimally important difference is 5 units  CAT: CAT can identify the health impairment of COPD patients and is  better correlated with disease progression.  CAT has a scoring range of zero to 40. The CAT score is classified into four groups of low (less than 10), medium (10 - 20), high (21-30) and very high (31-40) based on the impact level of disease on health status. A CAT score over 10 suggests significant symptoms.  A worsening CAT score could be explained by an exacerbation, poor medication adherence, poor inhaler technique, or progression of COPD or comorbid conditions.  CAT MCID is 2 points  mMRC: mMRC (Modified Medical Research Council) Dyspnea Scale is used to assess the degree of baseline functional disability in patients of respiratory disease due to dyspnea. No minimal important difference is established. A decrease in score of 1 point or greater is considered a positive change.   Pulmonary Function Assessment:  Pulmonary Function Assessment - 04/26/24 1105       Breath   Bilateral Breath Sounds Decreased;Clear    Shortness of Breath Yes;Limiting activity;Fear of Shortness of Breath          Exercise Target Goals: Exercise Program Goal: Individual exercise prescription set using results from initial 6 min walk test and THRR while considering  patient's activity barriers and safety.   Exercise Prescription Goal: Initial exercise prescription builds to 30-45 minutes a day of aerobic activity, 2-3 days per week.  Home exercise guidelines will be given to patient during program as part of exercise prescription that the participant will acknowledge.  Activity Barriers & Risk Stratification:  Activity Barriers & Cardiac Risk Stratification - 04/26/24 1104       Activity Barriers & Cardiac Risk Stratification    Activity Barriers Deconditioning;Muscular Weakness;Shortness of Breath;Right Hip Replacement;Back Problems    Cardiac Risk Stratification Moderate          6 Minute Walk:  6 Minute Walk     Row Name 04/26/24 1211         6 Minute Walk   Phase Initial     Distance 661 feet     Walk Time 6 minutes     # of Rest Breaks 1  3:55-4:12     MPH 1.25     METS 2.07     RPE 12     Perceived Dyspnea  1     VO2 Peak 7.24     Symptoms Yes (comment)     Comments hip and back pain     Resting HR 74 bpm     Resting BP 118/60     Resting Oxygen  Saturation  90 %     Exercise Oxygen  Saturation  during 6 min walk 86 %     Max Ex. HR 76 bpm     Max Ex. BP 122/64     2 Minute Post BP 112/60       Interval HR   1 Minute HR 72     2 Minute HR 71     3 Minute HR 75     4 Minute HR 71     5 Minute HR 76     6 Minute HR 76     2 Minute Post HR 60     Interval Heart Rate? Yes       Interval Oxygen    Interval Oxygen ? Yes     Baseline Oxygen  Saturation % 90 %     1 Minute Oxygen  Saturation % 90 %     1 Minute Liters of Oxygen  4 L     2 Minute Oxygen  Saturation %  86 %  O2 increased to 6L     2 Minute Liters of Oxygen  4 L  increased to 6L     3 Minute Oxygen  Saturation % 90 %     3 Minute Liters of Oxygen  6 L     4 Minute Oxygen  Saturation % 91 %     4 Minute Liters of Oxygen  6 L     5 Minute Oxygen  Saturation % 90 %     5 Minute Liters of Oxygen  6 L     6 Minute Oxygen  Saturation % 94 %     6 Minute Liters of Oxygen  6 L     2 Minute Post Oxygen  Saturation % 97 %     2 Minute Post Liters of Oxygen  6 L        Oxygen  Initial Assessment:  Oxygen  Initial Assessment - 04/26/24 1048       Home Oxygen    Home Oxygen  Device Home Concentrator;E-Tanks    Sleep Oxygen  Prescription Continuous    Liters per minute 4    Home Exercise Oxygen  Prescription Continuous    Liters per minute 4    Home Resting Oxygen  Prescription Continuous    Liters per minute 4      Initial 6 min Walk    Oxygen  Used Continuous    Liters per minute 6      Program Oxygen  Prescription   Program Oxygen  Prescription Continuous    Liters per minute 6      Intervention   Short Term Goals To learn and exhibit compliance with exercise, home and travel O2 prescription;To learn and understand importance of monitoring SPO2 with pulse oximeter and demonstrate accurate use of the pulse oximeter.;To learn and understand importance of maintaining oxygen  saturations>88%;To learn and demonstrate proper pursed lip breathing techniques or other breathing techniques. ;To learn and demonstrate proper use of respiratory medications    Long  Term Goals Exhibits compliance with exercise, home  and travel O2 prescription;Maintenance of O2 saturations>88%;Compliance with respiratory medication;Verbalizes importance of monitoring SPO2 with pulse oximeter and return demonstration;Exhibits proper breathing techniques, such as pursed lip breathing or other method taught during program session;Demonstrates proper use of MDI's          Oxygen  Re-Evaluation:  Oxygen  Re-Evaluation     Row Name 04/29/24 0951 05/24/24 1023 06/28/24 1052         Program Oxygen  Prescription   Program Oxygen  Prescription Continuous Continuous Continuous     Liters per minute 6 6 6        Home Oxygen    Home Oxygen  Device Home Concentrator;E-Tanks Home Concentrator;E-Tanks Home Concentrator;E-Tanks     Sleep Oxygen  Prescription Continuous Continuous Continuous     Liters per minute 4 4 4      Home Exercise Oxygen  Prescription Continuous Continuous Continuous     Liters per minute 4 4 4      Home Resting Oxygen  Prescription Continuous Continuous Continuous     Liters per minute 4 4 4        Goals/Expected Outcomes   Short Term Goals To learn and exhibit compliance with exercise, home and travel O2 prescription;To learn and understand importance of monitoring SPO2 with pulse oximeter and demonstrate accurate use of the pulse oximeter.;To learn  and understand importance of maintaining oxygen  saturations>88%;To learn and demonstrate proper pursed lip breathing techniques or other breathing techniques. ;To learn and demonstrate proper use of respiratory medications To learn and exhibit compliance with exercise, home and travel O2 prescription;To learn and understand importance  of monitoring SPO2 with pulse oximeter and demonstrate accurate use of the pulse oximeter.;To learn and understand importance of maintaining oxygen  saturations>88%;To learn and demonstrate proper pursed lip breathing techniques or other breathing techniques. ;To learn and demonstrate proper use of respiratory medications To learn and exhibit compliance with exercise, home and travel O2 prescription;To learn and understand importance of monitoring SPO2 with pulse oximeter and demonstrate accurate use of the pulse oximeter.;To learn and understand importance of maintaining oxygen  saturations>88%;To learn and demonstrate proper pursed lip breathing techniques or other breathing techniques. ;To learn and demonstrate proper use of respiratory medications     Long  Term Goals Exhibits compliance with exercise, home  and travel O2 prescription;Maintenance of O2 saturations>88%;Compliance with respiratory medication;Verbalizes importance of monitoring SPO2 with pulse oximeter and return demonstration;Exhibits proper breathing techniques, such as pursed lip breathing or other method taught during program session;Demonstrates proper use of MDI's Exhibits compliance with exercise, home  and travel O2 prescription;Maintenance of O2 saturations>88%;Compliance with respiratory medication;Verbalizes importance of monitoring SPO2 with pulse oximeter and return demonstration;Exhibits proper breathing techniques, such as pursed lip breathing or other method taught during program session;Demonstrates proper use of MDI's Exhibits compliance with exercise, home  and travel O2 prescription;Maintenance of  O2 saturations>88%;Compliance with respiratory medication;Verbalizes importance of monitoring SPO2 with pulse oximeter and return demonstration;Exhibits proper breathing techniques, such as pursed lip breathing or other method taught during program session;Demonstrates proper use of MDI's     Goals/Expected Outcomes Compliance and understanding of oxygen  saturation monitoring and breathing techniques to decrease shortness of breath Compliance and understanding of oxygen  saturation monitoring and breathing techniques to decrease shortness of breath Compliance and understanding of oxygen  saturation monitoring and breathing techniques to decrease shortness of breath        Oxygen  Discharge (Final Oxygen  Re-Evaluation):  Oxygen  Re-Evaluation - 06/28/24 1052       Program Oxygen  Prescription   Program Oxygen  Prescription Continuous    Liters per minute 6      Home Oxygen    Home Oxygen  Device Home Concentrator;E-Tanks    Sleep Oxygen  Prescription Continuous    Liters per minute 4    Home Exercise Oxygen  Prescription Continuous    Liters per minute 4    Home Resting Oxygen  Prescription Continuous    Liters per minute 4      Goals/Expected Outcomes   Short Term Goals To learn and exhibit compliance with exercise, home and travel O2 prescription;To learn and understand importance of monitoring SPO2 with pulse oximeter and demonstrate accurate use of the pulse oximeter.;To learn and understand importance of maintaining oxygen  saturations>88%;To learn and demonstrate proper pursed lip breathing techniques or other breathing techniques. ;To learn and demonstrate proper use of respiratory medications    Long  Term Goals Exhibits compliance with exercise, home  and travel O2 prescription;Maintenance of O2 saturations>88%;Compliance with respiratory medication;Verbalizes importance of monitoring SPO2 with pulse oximeter and return demonstration;Exhibits proper breathing techniques, such as pursed lip  breathing or other method taught during program session;Demonstrates proper use of MDI's    Goals/Expected Outcomes Compliance and understanding of oxygen  saturation monitoring and breathing techniques to decrease shortness of breath          Initial Exercise Prescription:  Initial Exercise Prescription - 04/26/24 1200       Date of Initial Exercise RX and Referring Provider   Date 04/26/24    Referring Provider [name]    Expected Discharge Date 07/23/24      Oxygen    Oxygen  Continuous  Liters 6    Maintain Oxygen  Saturation 88% or higher      NuStep   Level 1    SPM 54    Minutes 30    METs 1.4      Prescription Details   Frequency (times per week) 2    Duration Progress to 30 minutes of continuous aerobic without signs/symptoms of physical distress      Intensity   THRR 40-80% of Max Heartrate 60-121    Ratings of Perceived Exertion 11-13    Perceived Dyspnea 0-4      Progression   Progression Continue to progress workloads to maintain intensity without signs/symptoms of physical distress.      Resistance Training   Training Prescription Yes    Weight red bands    Reps 10-15          Perform Capillary Blood Glucose checks as needed.  Exercise Prescription Changes:   Exercise Prescription Changes     Row Name 05/07/24 0900 05/21/24 1200 06/04/24 1200 06/18/24 1200 07/02/24 1200     Response to Exercise   Blood Pressure (Admit) 112/58 114/52 102/60 110/62 111/57   Blood Pressure (Exercise) 92/52 132/70 110/60 118/70 122/68   Blood Pressure (Exit) 110/62 102/60 102/62 96/52 108/60   Heart Rate (Admit) 50 bpm 64 bpm 55 bpm 71 bpm 58 bpm   Heart Rate (Exercise) 57 bpm 67 bpm 61 bpm 71 bpm 71 bpm   Heart Rate (Exit) 61 bpm 73 bpm 54 bpm 69 bpm 66 bpm   Oxygen  Saturation (Admit) 99 % 97 % 100 % 96 %  4L 98 %   Oxygen  Saturation (Exercise) 97 % 95 % 96 % 94 %  4L 96 %   Oxygen  Saturation (Exit) 100 % 97 % 98 % 100 %  4K 100 %   Rating of Perceived  Exertion (Exercise) 14 13 13 13 13    Perceived Dyspnea (Exercise) 1 2.5 2 2.5 2   Symptoms Dizziness -- -- -- --   Duration Progress to 30 minutes of  aerobic without signs/symptoms of physical distress Continue with 30 min of aerobic exercise without signs/symptoms of physical distress. Continue with 30 min of aerobic exercise without signs/symptoms of physical distress. Continue with 30 min of aerobic exercise without signs/symptoms of physical distress. Continue with 30 min of aerobic exercise without signs/symptoms of physical distress.   Intensity THRR unchanged THRR unchanged THRR unchanged THRR unchanged THRR unchanged     Progression   Progression Continue to progress workloads to maintain intensity without signs/symptoms of physical distress. Continue to progress workloads to maintain intensity without signs/symptoms of physical distress. Continue to progress workloads to maintain intensity without signs/symptoms of physical distress. Continue to progress workloads to maintain intensity without signs/symptoms of physical distress. Continue to progress workloads to maintain intensity without signs/symptoms of physical distress.     Resistance Training   Training Prescription Yes Yes Yes Yes Yes   Weight red bands red bands red bands red bands red bands   Reps 10-15 10-15 10-15 10-15 10-15   Time 10 Minutes 10 Minutes 10 Minutes 10 Minutes 10 Minutes     Oxygen    Oxygen  Continuous Continuous Continuous Continuous Continuous   Liters 4 4 4 4 4      NuStep   Level 1 2 3 3 3    SPM 54 50 65 -- --   Minutes 30 30 30 30 30    METs 1.7 1.7 1.8 1.9 1.8     Oxygen   Maintain Oxygen  Saturation 88% or higher -- 88% or higher 88% or higher 88% or higher      Exercise Comments:   Exercise Goals and Review:   Exercise Goals     Row Name 04/26/24 1048             Exercise Goals   Increase Physical Activity Yes       Intervention Provide advice, education, support and counseling  about physical activity/exercise needs.;Develop an individualized exercise prescription for aerobic and resistive training based on initial evaluation findings, risk stratification, comorbidities and participant's personal goals.       Expected Outcomes Short Term: Attend rehab on a regular basis to increase amount of physical activity.;Long Term: Add in home exercise to make exercise part of routine and to increase amount of physical activity.;Long Term: Exercising regularly at least 3-5 days a week.       Increase Strength and Stamina Yes       Intervention Provide advice, education, support and counseling about physical activity/exercise needs.;Develop an individualized exercise prescription for aerobic and resistive training based on initial evaluation findings, risk stratification, comorbidities and participant's personal goals.       Expected Outcomes Short Term: Increase workloads from initial exercise prescription for resistance, speed, and METs.;Short Term: Perform resistance training exercises routinely during rehab and add in resistance training at home;Long Term: Improve cardiorespiratory fitness, muscular endurance and strength as measured by increased METs and functional capacity ( )       Able to understand and use rate of perceived exertion (RPE) scale Yes       Intervention Provide education and explanation on how to use RPE scale       Expected Outcomes Short Term: Able to use RPE daily in rehab to express subjective intensity level;Long Term:  Able to use RPE to guide intensity level when exercising independently       Able to understand and use Dyspnea scale Yes       Intervention Provide education and explanation on how to use Dyspnea scale       Expected Outcomes Short Term: Able to use Dyspnea scale daily in rehab to express subjective sense of shortness of breath during exertion;Long Term: Able to use Dyspnea scale to guide intensity level when exercising independently        Knowledge and understanding of Target Heart Rate Range (THRR) Yes       Intervention Provide education and explanation of THRR including how the numbers were predicted and where they are located for reference       Expected Outcomes Short Term: Able to state/look up THRR;Long Term: Able to use THRR to govern intensity when exercising independently;Short Term: Able to use daily as guideline for intensity in rehab       Understanding of Exercise Prescription Yes       Intervention Provide education, explanation, and written materials on patient's individual exercise prescription       Expected Outcomes Short Term: Able to explain program exercise prescription;Long Term: Able to explain home exercise prescription to exercise independently          Exercise Goals Re-Evaluation :  Exercise Goals Re-Evaluation     Row Name 04/29/24 0950 05/24/24 1009 06/28/24 1050         Exercise Goal Re-Evaluation   Exercise Goals Review Increase Physical Activity;Understanding of Exercise Prescription;Able to understand and use Dyspnea scale;Increase Strength and Stamina;Knowledge and understanding of Target Heart Rate Range (THRR);Able to understand and use  rate of perceived exertion (RPE) scale Increase Physical Activity;Understanding of Exercise Prescription;Able to understand and use Dyspnea scale;Increase Strength and Stamina;Knowledge and understanding of Target Heart Rate Range (THRR);Able to understand and use rate of perceived exertion (RPE) scale Increase Physical Activity;Understanding of Exercise Prescription;Able to understand and use Dyspnea scale;Increase Strength and Stamina;Knowledge and understanding of Target Heart Rate Range (THRR);Able to understand and use rate of perceived exertion (RPE) scale     Comments Brett [name] is scheduled to start exercise on 9/25. Will continue to monitor and progress as able. Brett [name] has completed 6 exercise sessions. He exercises for 30 min on the Nustep. He averages 1.8 METs  at level 2 on the Nustep. He performs the warmup and cooldown standing/ seated dependent on his shortness of breath/ fatigue. Brett [name] has increased his level on the Nustep as METs have slightly increased. He is very deconditioned. Will continue to monitor and progress as able. Brett [name] has completed 16 exercise sessions. He exercises for 30 min on the Nustep. He averages 2 METs at level 3 on the Nustep. He performs the warmup and cooldown standing/ seated dependent on his shortness of breath/ fatigue. Brett [name] continues to increase his level on the Nustep. His METs have slightly increased because of this change. He remains motivated to exercise. Will continue to monitor and progress as able.     Expected Outcomes Through exercise at rehab and home, the patient will decrease shortness of breath with daily activities and feel confident in carrying out an exercise regimen at home. Through exercise at rehab and home, the patient will decrease shortness of breath with daily activities and feel confident in carrying out an exercise regimen at home. Through exercise at rehab and home, the patient will decrease shortness of breath with daily activities and feel confident in carrying out an exercise regimen at home.        Discharge Exercise Prescription (Final Exercise Prescription Changes):  Exercise Prescription Changes - 07/02/24 1200       Response to Exercise   Blood Pressure (Admit) 111/57    Blood Pressure (Exercise) 122/68    Blood Pressure (Exit) 108/60    Heart Rate (Admit) 58 bpm    Heart Rate (Exercise) 71 bpm    Heart Rate (Exit) 66 bpm    Oxygen  Saturation (Admit) 98 %    Oxygen  Saturation (Exercise) 96 %    Oxygen  Saturation (Exit) 100 %    Rating of Perceived Exertion (Exercise) 13    Perceived Dyspnea (Exercise) 2    Duration Continue with 30 min of aerobic exercise without signs/symptoms of physical distress.    Intensity THRR unchanged      Progression   Progression Continue to progress  workloads to maintain intensity without signs/symptoms of physical distress.      Resistance Training   Training Prescription Yes    Weight red bands    Reps 10-15    Time 10 Minutes      Oxygen    Oxygen  Continuous    Liters 4      NuStep   Level 3    Minutes 30    METs 1.8      Oxygen    Maintain Oxygen  Saturation 88% or higher          Nutrition:  Target Goals: Understanding of nutrition guidelines, daily intake of sodium 1500mg , cholesterol 200mg , calories 30% from fat and 7% or less from saturated fats, daily to have 5 or more servings of fruits and vegetables.  Biometrics:  Nutrition Therapy Plan and Nutrition Goals:  Nutrition Therapy & Goals - 06/13/24 1133       Nutrition Therapy   Diet Regular diet      Personal Nutrition Goals   Nutrition Goal Patient to identify strategies for weight gain of 0.5-2# per week.    Comments Patient with medical history significant for COPD, lung cancer s/p robotic assisted left lower lobectomy on 02/19/2024. Low BMI based on ideal BMI of 23-27 kg/m2 for >= 65 years. Pt reports working towards healthy wt gain by incorporating rich sources of protein such as salmon. Also consuming high protein oral nutrition supplement once daily. RD provided suggestions to further increase caloric intake.      Intervention Plan   Intervention Prescribe, educate and counsel regarding individualized specific dietary modifications aiming towards targeted core components such as weight, hypertension, lipid management, diabetes, heart failure and other comorbidities.;Nutrition handout(s) given to patient.   Weight Gain handout provided   Expected Outcomes Short Term Goal: Understand basic principles of dietary content, such as calories, fat, sodium, cholesterol and nutrients.;Long Term Goal: Adherence to prescribed nutrition plan.          Nutrition Assessments:  MEDIFICTS Score Key: >=70 Need to make dietary changes  40-70 Heart Healthy  Diet <= 40 Therapeutic Level Cholesterol Diet   Picture Your Plate Scores: <59 Unhealthy dietary pattern with much room for improvement. 41-50 Dietary pattern unlikely to meet recommendations for good health and room for improvement. 51-60 More healthful dietary pattern, with some room for improvement.  >60 Healthy dietary pattern, although there may be some specific behaviors that could be improved.    Nutrition Goals Re-Evaluation:   Nutrition Goals Discharge (Final Nutrition Goals Re-Evaluation):   Psychosocial: Target Goals: Acknowledge presence or absence of significant depression and/or stress, maximize coping skills, provide positive support system. Participant is able to verbalize types and ability to use techniques and skills needed for reducing stress and depression.  Initial Review & Psychosocial Screening:  Initial Psych Review & Screening - 04/26/24 1059       Initial Review   Current issues with None Identified      Family Dynamics   Good Support System? Yes      Barriers   Psychosocial barriers to participate in program There are no identifiable barriers or psychosocial needs.      Screening Interventions   Interventions Encouraged to exercise          Quality of Life Scores:  Scores of 19 and below usually indicate a poorer quality of life in these areas.  A difference of  2-3 points is a clinically meaningful difference.  A difference of 2-3 points in the total score of the Quality of Life Index has been associated with significant improvement in overall quality of life, self-image, physical symptoms, and general health in studies assessing change in quality of life.  PHQ-9: Review Flowsheet  More data exists      04/26/2024 01/16/2024 02/16/2023 11/01/2021 04/26/2021  Depression screen PHQ 2/9  Decreased Interest 0 0 1 0 0  Down, Depressed, Hopeless 0 0 0 0 0  PHQ - 2 Score 0 0 1 0 0  Altered sleeping 0 - - - -  Tired, decreased energy 0 - - - -   Change in appetite 0 - - - -  Feeling bad or failure about yourself  0 - - - -  Trouble concentrating 0 - - - -  Moving slowly or fidgety/restless 0 - - - -  Suicidal thoughts 0 - - - -  PHQ-9 Score 0  - - - -  Difficult doing work/chores Not difficult at all - - - -    Details       Data saved with a previous flowsheet row definition        Interpretation of Total Score  Total Score Depression Severity:  1-4 = Minimal depression, 5-9 = Mild depression, 10-14 = Moderate depression, 15-19 = Moderately severe depression, 20-27 = Severe depression   Psychosocial Evaluation and Intervention:  Psychosocial Evaluation - 04/26/24 1155       Psychosocial Evaluation & Interventions   Interventions Encouraged to exercise with the program and follow exercise prescription    Comments Brett [name] denies any psychosocial barriers or concerns at this time    Expected Outcomes For Brett [name] to participate in PR free of any psychosocial barriers or concerns    Continue Psychosocial Services  No Follow up required          Psychosocial Re-Evaluation:  Psychosocial Re-Evaluation     Row Name 04/29/24 1438 05/29/24 1030 06/24/24 1530         Psychosocial Re-Evaluation   Current issues with None Identified None Identified None Identified     Comments Brett [name] is scheduled to start PR on 05/02/24. No new psychosocial barriers or concerns at this time. Brett [name] continues to deny any new psy/soc barriers or concerns at this time. He enjoy's coming to class and exercising along with being able to socialize with the other class participants. He has good support from his daughter. Brett [name] continues to deny any new psy/soc barriers or concerns at this time.     Expected Outcomes For Estel to participate in PR free of any psychosocial barriers or concerns For Kethan to participate in PR free of any psychosocial barriers or concerns For Brett [name] to participate in PR free of any psychosocial barriers or concerns      Interventions Encouraged to attend Pulmonary Rehabilitation for the exercise Encouraged to attend Pulmonary Rehabilitation for the exercise Encouraged to attend Pulmonary Rehabilitation for the exercise     Continue Psychosocial Services  No Follow up required No Follow up required No Follow up required        Psychosocial Discharge (Final Psychosocial Re-Evaluation):  Psychosocial Re-Evaluation - 06/24/24 1530       Psychosocial Re-Evaluation   Current issues with None Identified    Comments Brett [name] continues to deny any new psy/soc barriers or concerns at this time.    Expected Outcomes For Brett [name] to participate in PR free of any psychosocial barriers or concerns    Interventions Encouraged to attend Pulmonary Rehabilitation for the exercise    Continue Psychosocial Services  No Follow up required          Education: Education Goals: Education classes will be provided on a weekly basis, covering required topics. Participant will state understanding/return demonstration of topics presented.  Learning Barriers/Preferences:  Learning Barriers/Preferences - 04/26/24 1101       Learning Barriers/Preferences   Learning Barriers Sight    Learning Preferences None          Education Topics: Know Your Numbers Group instruction that is supported by a PowerPoint presentation. Instructor discusses importance of knowing and understanding resting, exercise, and post-exercise oxygen  saturation, heart rate, and blood pressure. Oxygen  saturation, heart rate, blood pressure, rating of perceived exertion, and dyspnea are reviewed along with a normal range for these values.    Exercise for the Pulmonary Patient Group  instruction that is supported by a PowerPoint presentation. Instructor discusses benefits of exercise, core components of exercise, frequency, duration, and intensity of an exercise routine, importance of utilizing pulse oximetry during exercise, safety while exercising, and  options of places to exercise outside of rehab.    MET Level  Group instruction provided by PowerPoint, verbal discussion, and written material to support subject matter. Instructor reviews what METs are and how to increase METs.  Flowsheet Row PULMONARY REHAB OTHER RESPIRATORY from 06/13/2024 in [facility]  Date 06/13/24  Educator EP  Instruction Review Code 1- Verbalizes Understanding    Pulmonary Medications Verbally interactive group education provided by instructor with focus on inhaled medications and proper administration.   Anatomy and Physiology of the Respiratory System Group instruction provided by PowerPoint, verbal discussion, and written material to support subject matter. Instructor reviews respiratory cycle and anatomical components of the respiratory system and their functions. Instructor also reviews differences in obstructive and restrictive respiratory diseases with examples of each.  Flowsheet Row PULMONARY REHAB OTHER RESPIRATORY from 06/27/2024 in [facility]  Date 06/27/24  Educator RT  Instruction Review Code 1- Verbalizes Understanding    Oxygen  Safety Group instruction provided by PowerPoint, verbal discussion, and written material to support subject matter. There is an overview of "What is Oxygen " and "Why do we need it".  Instructor also reviews how to create a safe environment for oxygen  use, the importance of using oxygen  as prescribed, and the risks of noncompliance. There is a brief discussion on traveling with oxygen  and resources the patient may utilize. Flowsheet Row PULMONARY REHAB OTHER RESPIRATORY from 05/02/2024 in [facility]  Date 05/02/24  Educator RN  Instruction Review Code 1- Verbalizes Understanding    Oxygen  Use Group instruction provided by PowerPoint, verbal  discussion, and written material to discuss how supplemental oxygen  is prescribed and different types of oxygen  supply systems. Resources for more information are provided.  Flowsheet Row PULMONARY REHAB OTHER RESPIRATORY from 05/09/2024 in [facility]  Date 05/09/24  Educator RT  Instruction Review Code 1- Verbalizes Understanding    Breathing Techniques Group instruction that is supported by demonstration and informational handouts. Instructor discusses the benefits of pursed lip and diaphragmatic breathing and detailed demonstration on how to perform both.     Risk Factor Reduction Group instruction that is supported by a PowerPoint presentation. Instructor discusses the definition of a risk factor, different risk factors for pulmonary disease, and how the heart and lungs work together. Flowsheet Row PULMONARY REHAB OTHER RESPIRATORY from 06/06/2024 in [facility], [facility]  Date 06/06/24  Educator EP  Instruction Review Code 1- Verbalizes Understanding    Pulmonary Diseases Group instruction provided by PowerPoint, verbal discussion, and written material to support subject matter. Instructor gives an overview of the different type of pulmonary diseases. There is also a discussion on risk factors and symptoms as well as ways to manage the diseases. Flowsheet Row PULMONARY REHAB OTHER RESPIRATORY from 06/20/2024 in [facility], [facility], [facility]  Date 06/20/24  Educator RT  Instruction Review Code 1- Verbalizes Understanding    Stress and Energy Conservation Group instruction provided by PowerPoint, verbal discussion, and written material to support subject matter. Instructor gives an overview  of stress and the impact it can have on the body. Instructor also reviews ways to reduce stress. There is also a discussion on energy conservation and  ways to conserve energy throughout the day. Flowsheet Row PULMONARY REHAB OTHER RESPIRATORY from 05/23/2024 in [facility], Vascular, [facility]  Date 05/23/24  Educator RN  Instruction Review Code 1- Verbalizes Understanding    Warning Signs and Symptoms Group instruction provided by PowerPoint, verbal discussion, and written material to support subject matter. Instructor reviews warning signs and symptoms of stroke, heart attack, cold and flu. Instructor also reviews ways to prevent the spread of infection. Flowsheet Row PULMONARY REHAB OTHER RESPIRATORY from 05/30/2024 in [facility]  Date 05/30/24  Educator RN  Instruction Review Code 1- Verbalizes Understanding    Other Education Group or individual verbal, written, or video instructions that support the educational goals of the pulmonary rehab program.    Knowledge Questionnaire Score:  Knowledge Questionnaire Score - 04/26/24 1050       Knowledge Questionnaire Score   Pre Score 15/18          Core Components/Risk Factors/Patient Goals at Admission:  Personal Goals and Risk Factors at Admission - 04/26/24 1102       Core Components/Risk Factors/Patient Goals on Admission   Tobacco Cessation Yes    Number of packs per day vapes every now and then    Intervention Assist the participant in steps to quit. Provide individualized education and counseling about committing to Tobacco Cessation, relapse prevention, and pharmacological support that can be provided by physician.;Education officer, environmental, assist with locating and accessing local/national Quit Smoking programs, and support quit date choice.    Expected Outcomes Short Term: Will demonstrate readiness to quit, by selecting a quit date.;Short Term: Will quit all tobacco product use, adhering to prevention of relapse plan.;Long Term: Complete abstinence from all tobacco  products for at least 12 months from quit date.    Improve shortness of breath with ADL's Yes    Intervention Provide education, individualized exercise plan and daily activity instruction to help decrease symptoms of SOB with activities of daily living.    Expected Outcomes Short Term: Improve cardiorespiratory fitness to achieve a reduction of symptoms when performing ADLs;Long Term: Be able to perform more ADLs without symptoms or delay the onset of symptoms          Core Components/Risk Factors/Patient Goals Review:   Goals and Risk Factor Review     Row Name 04/29/24 1440 05/29/24 1032 06/24/24 1533         Core Components/Risk Factors/Patient Goals Review   Personal Goals Review Tobacco Cessation;Improve shortness of breath with ADL's;Develop more efficient breathing techniques such as purse lipped breathing and diaphragmatic breathing and practicing self-pacing with activity. Tobacco Cessation;Improve shortness of breath with ADL's;Develop more efficient breathing techniques such as purse lipped breathing and diaphragmatic breathing and practicing self-pacing with activity. Tobacco Cessation;Improve shortness of breath with ADL's;Develop more efficient breathing techniques such as purse lipped breathing and diaphragmatic breathing and practicing self-pacing with activity.     Review Coltan is scheduled to start PR on 05/02/24. Unable to assess his goals at this time. Monthly review of patient's Core Components/Risk Factors/Patient Goals are as follows: Goal progressing for tobacco cessation. Brett [name] has not yet quit vaping. Staff will provide Brett [name] with resources on ways to quit. Goal progressing for improving shortness of breath with ADL's. Brett [name] is  currently using 4L O2 while exercising to keep sats >88%. He is exercising on the Nustep for 30 minutes and has been able to increase his workload and MET's. Goal progressing for developing more efficient breathing techniques such as purse lipped  breathing and diaphragmatic breathing; and practicing self-pacing with activity.  We will continue to monitor Bob's progress throughout the program. Monthly review of patient's Core Components/Risk Factors/Patient Goals are as follows: Goal progressing for tobacco cessation. Brett [name] has not yet quit vaping, but has cut back significantly. Goal progressing for improving shortness of breath with ADL's. Brett [name] is currently using 4L O2 while exercising to keep sats >88%. He is exercising on the Nustep for 30 minutes and has been able to increase his workload and MET's. Goal met for developing more efficient breathing techniques such as purse lipped breathing and diaphragmatic breathing; and practicing self-pacing with activity. Brett [name] is able use purse lip breathing when he becomes short of breath. He also knows how to self pace based on his RPE/dyspnea scores.  We will continue to monitor Bob's progress throughout the program.     Expected Outcomes To improve shortness of breath with ADL's, develop more efficient breathing techniques such as purse lipped breathing and diaphragmatic breathing; and practicing self-pacing with activity and stop vaping. To improve shortness of breath with ADL's, develop more efficient breathing techniques such as purse lipped breathing and diaphragmatic breathing; and practicing self-pacing with activity and stop vaping. To improve shortness of breath with ADL's and stop vaping.        Core Components/Risk Factors/Patient Goals at Discharge (Final Review):   Goals and Risk Factor Review - 06/24/24 1533       Core Components/Risk Factors/Patient Goals Review   Personal Goals Review Tobacco Cessation;Improve shortness of breath with ADL's;Develop more efficient breathing techniques such as purse lipped breathing and diaphragmatic breathing and practicing self-pacing with activity.    Review Monthly review of patient's Core Components/Risk Factors/Patient Goals are as follows: Goal  progressing for tobacco cessation. Brett [name] has not yet quit vaping, but has cut back significantly. Goal progressing for improving shortness of breath with ADL's. Brett [name] is currently using 4L O2 while exercising to keep sats >88%. He is exercising on the Nustep for 30 minutes and has been able to increase his workload and MET's. Goal met for developing more efficient breathing techniques such as purse lipped breathing and diaphragmatic breathing; and practicing self-pacing with activity. Brett [name] is able use purse lip breathing when he becomes short of breath. He also knows how to self pace based on his RPE/dyspnea scores.  We will continue to monitor Bob's progress throughout the program.    Expected Outcomes To improve shortness of breath with ADL's and stop vaping.          ITP Comments: Pt is making expected progress toward Pulmonary Rehab goals after completing 17 session(s). Recommend continued exercise, life style modification, education, and utilization of breathing techniques to increase stamina and strength, while also decreasing shortness of breath with exertion.  Dr. Slater Staff is Medical Director for Pulmonary Rehab at [facility]

## 2024-07-08 DIAGNOSIS — C801 Malignant (primary) neoplasm, unspecified: Secondary | ICD-10-CM | POA: Insufficient documentation

## 2024-07-08 DIAGNOSIS — I499 Cardiac arrhythmia, unspecified: Secondary | ICD-10-CM | POA: Insufficient documentation

## 2024-07-08 DIAGNOSIS — J45909 Unspecified asthma, uncomplicated: Secondary | ICD-10-CM | POA: Insufficient documentation

## 2024-07-09 ENCOUNTER — Emergency Department (HOSPITAL_COMMUNITY)

## 2024-07-09 ENCOUNTER — Observation Stay (HOSPITAL_COMMUNITY)

## 2024-07-09 ENCOUNTER — Encounter (HOSPITAL_COMMUNITY): Admission: RE | Admit: 2024-07-09

## 2024-07-09 ENCOUNTER — Encounter (HOSPITAL_COMMUNITY): Payer: Self-pay

## 2024-07-09 ENCOUNTER — Inpatient Hospital Stay (HOSPITAL_COMMUNITY)
Admission: EM | Admit: 2024-07-09 | Discharge: 2024-07-11 | DRG: 177 | Disposition: A | Attending: Family Medicine | Admitting: Family Medicine

## 2024-07-09 ENCOUNTER — Other Ambulatory Visit: Payer: Self-pay

## 2024-07-09 DIAGNOSIS — Z96641 Presence of right artificial hip joint: Secondary | ICD-10-CM | POA: Diagnosis not present

## 2024-07-09 DIAGNOSIS — J22 Unspecified acute lower respiratory infection: Secondary | ICD-10-CM | POA: Diagnosis not present

## 2024-07-09 DIAGNOSIS — Z85118 Personal history of other malignant neoplasm of bronchus and lung: Secondary | ICD-10-CM | POA: Diagnosis not present

## 2024-07-09 DIAGNOSIS — M419 Scoliosis, unspecified: Secondary | ICD-10-CM | POA: Diagnosis not present

## 2024-07-09 DIAGNOSIS — H409 Unspecified glaucoma: Secondary | ICD-10-CM | POA: Diagnosis not present

## 2024-07-09 DIAGNOSIS — J449 Chronic obstructive pulmonary disease, unspecified: Secondary | ICD-10-CM

## 2024-07-09 DIAGNOSIS — C3432 Malignant neoplasm of lower lobe, left bronchus or lung: Secondary | ICD-10-CM | POA: Diagnosis not present

## 2024-07-09 DIAGNOSIS — M130 Polyarthritis, unspecified: Secondary | ICD-10-CM | POA: Diagnosis not present

## 2024-07-09 DIAGNOSIS — Z902 Acquired absence of lung [part of]: Secondary | ICD-10-CM | POA: Diagnosis not present

## 2024-07-09 DIAGNOSIS — R000 Tachycardia, unspecified: Secondary | ICD-10-CM | POA: Diagnosis not present

## 2024-07-09 DIAGNOSIS — R0682 Tachypnea, not elsewhere classified: Secondary | ICD-10-CM | POA: Diagnosis not present

## 2024-07-09 DIAGNOSIS — Z1152 Encounter for screening for COVID-19: Secondary | ICD-10-CM | POA: Diagnosis not present

## 2024-07-09 DIAGNOSIS — R070 Pain in throat: Secondary | ICD-10-CM | POA: Diagnosis not present

## 2024-07-09 DIAGNOSIS — I5032 Chronic diastolic (congestive) heart failure: Secondary | ICD-10-CM

## 2024-07-09 DIAGNOSIS — Z7982 Long term (current) use of aspirin: Secondary | ICD-10-CM | POA: Diagnosis not present

## 2024-07-09 DIAGNOSIS — G8929 Other chronic pain: Secondary | ICD-10-CM | POA: Diagnosis not present

## 2024-07-09 DIAGNOSIS — B9789 Other viral agents as the cause of diseases classified elsewhere: Secondary | ICD-10-CM | POA: Diagnosis not present

## 2024-07-09 DIAGNOSIS — Z87891 Personal history of nicotine dependence: Secondary | ICD-10-CM | POA: Diagnosis not present

## 2024-07-09 DIAGNOSIS — R0602 Shortness of breath: Secondary | ICD-10-CM | POA: Diagnosis not present

## 2024-07-09 DIAGNOSIS — J441 Chronic obstructive pulmonary disease with (acute) exacerbation: Secondary | ICD-10-CM | POA: Diagnosis not present

## 2024-07-09 DIAGNOSIS — Z7901 Long term (current) use of anticoagulants: Secondary | ICD-10-CM | POA: Diagnosis not present

## 2024-07-09 DIAGNOSIS — J929 Pleural plaque without asbestos: Secondary | ICD-10-CM | POA: Diagnosis not present

## 2024-07-09 DIAGNOSIS — L409 Psoriasis, unspecified: Secondary | ICD-10-CM | POA: Diagnosis not present

## 2024-07-09 DIAGNOSIS — R509 Fever, unspecified: Secondary | ICD-10-CM | POA: Diagnosis not present

## 2024-07-09 DIAGNOSIS — Z800 Family history of malignant neoplasm of digestive organs: Secondary | ICD-10-CM | POA: Diagnosis not present

## 2024-07-09 DIAGNOSIS — Z79899 Other long term (current) drug therapy: Secondary | ICD-10-CM | POA: Diagnosis not present

## 2024-07-09 DIAGNOSIS — I509 Heart failure, unspecified: Secondary | ICD-10-CM

## 2024-07-09 DIAGNOSIS — J984 Other disorders of lung: Secondary | ICD-10-CM | POA: Diagnosis not present

## 2024-07-09 DIAGNOSIS — I4891 Unspecified atrial fibrillation: Secondary | ICD-10-CM | POA: Diagnosis not present

## 2024-07-09 DIAGNOSIS — J690 Pneumonitis due to inhalation of food and vomit: Secondary | ICD-10-CM | POA: Diagnosis not present

## 2024-07-09 DIAGNOSIS — I959 Hypotension, unspecified: Secondary | ICD-10-CM | POA: Diagnosis not present

## 2024-07-09 DIAGNOSIS — J9621 Acute and chronic respiratory failure with hypoxia: Secondary | ICD-10-CM

## 2024-07-09 DIAGNOSIS — D8481 Immunodeficiency due to conditions classified elsewhere: Secondary | ICD-10-CM | POA: Diagnosis not present

## 2024-07-09 DIAGNOSIS — Z981 Arthrodesis status: Secondary | ICD-10-CM | POA: Diagnosis not present

## 2024-07-09 DIAGNOSIS — I428 Other cardiomyopathies: Secondary | ICD-10-CM | POA: Diagnosis not present

## 2024-07-09 DIAGNOSIS — J439 Emphysema, unspecified: Secondary | ICD-10-CM | POA: Diagnosis not present

## 2024-07-09 DIAGNOSIS — M0580 Other rheumatoid arthritis with rheumatoid factor of unspecified site: Secondary | ICD-10-CM | POA: Diagnosis present

## 2024-07-09 LAB — GROUP A STREP BY PCR: Group A Strep by PCR: NOT DETECTED

## 2024-07-09 LAB — CBC WITH DIFFERENTIAL/PLATELET
Abs Immature Granulocytes: 0.04 K/uL (ref 0.00–0.07)
Basophils Absolute: 0 K/uL (ref 0.0–0.1)
Basophils Relative: 1 %
Eosinophils Absolute: 0.1 K/uL (ref 0.0–0.5)
Eosinophils Relative: 2 %
HCT: 39.7 % (ref 39.0–52.0)
Hemoglobin: 13.1 g/dL (ref 13.0–17.0)
Immature Granulocytes: 1 %
Lymphocytes Relative: 14 %
Lymphs Abs: 0.9 K/uL (ref 0.7–4.0)
MCH: 32.6 pg (ref 26.0–34.0)
MCHC: 33 g/dL (ref 30.0–36.0)
MCV: 98.8 fL (ref 80.0–100.0)
Monocytes Absolute: 0.9 K/uL (ref 0.1–1.0)
Monocytes Relative: 14 %
Neutro Abs: 4.7 K/uL (ref 1.7–7.7)
Neutrophils Relative %: 68 %
Platelets: 189 K/uL (ref 150–400)
RBC: 4.02 MIL/uL — ABNORMAL LOW (ref 4.22–5.81)
RDW: 13.2 % (ref 11.5–15.5)
WBC: 6.7 K/uL (ref 4.0–10.5)
nRBC: 0 % (ref 0.0–0.2)

## 2024-07-09 LAB — RESPIRATORY PANEL BY PCR

## 2024-07-09 LAB — COMPREHENSIVE METABOLIC PANEL WITH GFR
ALT: 15 U/L (ref 0–44)
AST: 30 U/L (ref 15–41)
Albumin: 3 g/dL — ABNORMAL LOW (ref 3.5–5.0)
Alkaline Phosphatase: 50 U/L (ref 38–126)
Anion gap: 10 (ref 5–15)
BUN: 18 mg/dL (ref 8–23)
CO2: 26 mmol/L (ref 22–32)
Calcium: 8.3 mg/dL — ABNORMAL LOW (ref 8.9–10.3)
Chloride: 100 mmol/L (ref 98–111)
Creatinine, Ser: 1 mg/dL (ref 0.61–1.24)
GFR, Estimated: >60 mL/min (ref >60)
Glucose, Bld: 131 mg/dL — ABNORMAL HIGH (ref 70–99)
Potassium: 4.5 mmol/L (ref 3.5–5.1)
Sodium: 136 mmol/L (ref 135–145)
Total Bilirubin: 0.7 mg/dL (ref 0.0–1.2)
Total Protein: 6.1 g/dL — ABNORMAL LOW (ref 6.5–8.1)

## 2024-07-09 LAB — HIV ANTIBODY (ROUTINE TESTING W REFLEX): HIV Screen 4th Generation wRfx: NONREACTIVE

## 2024-07-09 LAB — TROPONIN I (HIGH SENSITIVITY): Troponin I (High Sensitivity): 12 ng/L (ref <18)

## 2024-07-09 LAB — BRAIN NATRIURETIC PEPTIDE: B Natriuretic Peptide: 222.9 pg/mL — ABNORMAL HIGH (ref 0.0–100.0)

## 2024-07-09 LAB — RESP PANEL BY RT-PCR (RSV, FLU A&B, COVID)  RVPGX2
Influenza A by PCR: NEGATIVE
Influenza B by PCR: NEGATIVE
Resp Syncytial Virus by PCR: NEGATIVE
SARS Coronavirus 2 by RT PCR: NEGATIVE

## 2024-07-09 LAB — LIPASE, BLOOD: Lipase: 27 U/L (ref 11–51)

## 2024-07-09 LAB — STREP PNEUMONIAE URINARY ANTIGEN: Strep Pneumo Urinary Antigen: NEGATIVE

## 2024-07-09 MED ORDER — MULTIVITAMIN GUMMIES ADULT PO CHEW: 8.0000 | CHEWABLE_TABLET | Freq: Every morning | ORAL | Status: DC

## 2024-07-09 MED ORDER — SODIUM CHLORIDE 0.9 % IV SOLN
2.0000 g | INTRAVENOUS | Status: DC
  Administered 2024-07-09 – 2024-07-10 (×2): 2 g via INTRAVENOUS
  Filled 2024-07-09 (×2): qty 20

## 2024-07-09 MED ORDER — PREDNISONE 20 MG PO TABS
40.0000 mg | ORAL_TABLET | Freq: Every day | ORAL | Status: DC
  Administered 2024-07-09 – 2024-07-11 (×3): 40 mg via ORAL
  Filled 2024-07-09 (×3): qty 2

## 2024-07-09 MED ORDER — SODIUM CHLORIDE 0.9 % IV SOLN: 500.0000 mg | INTRAVENOUS | Status: DC

## 2024-07-09 MED ORDER — SODIUM CHLORIDE 0.9 % IV SOLN
1.0000 g | Freq: Once | INTRAVENOUS | Status: AC
  Administered 2024-07-09: 1 g via INTRAVENOUS
  Filled 2024-07-09: qty 10

## 2024-07-09 MED ORDER — FLUTICASONE FUROATE-VILANTEROL 100-25 MCG/ACT IN AEPB
1.0000 | INHALATION_SPRAY | Freq: Every day | RESPIRATORY_TRACT | Status: DC
  Administered 2024-07-11: 1 via RESPIRATORY_TRACT
  Filled 2024-07-09: qty 28

## 2024-07-09 MED ORDER — FUROSEMIDE 20 MG PO TABS: 20.0000 mg | ORAL_TABLET | Freq: Every day | ORAL | Status: DC | PRN

## 2024-07-09 MED ORDER — ACETAMINOPHEN 500 MG PO TABS
1000.0000 mg | ORAL_TABLET | Freq: Once | ORAL | Status: AC
  Administered 2024-07-09: 1000 mg via ORAL
  Filled 2024-07-09: qty 2

## 2024-07-09 MED ORDER — OXYCODONE HCL 5 MG PO TABS
5.0000 mg | ORAL_TABLET | Freq: Once | ORAL | Status: AC
  Administered 2024-07-09: 5 mg via ORAL
  Filled 2024-07-09: qty 1

## 2024-07-09 MED ORDER — ORAL CARE MOUTH RINSE: 15.0000 mL | OROMUCOSAL | Status: DC | PRN

## 2024-07-09 MED ORDER — MAGNESIUM SULFATE 2 GM/50ML IV SOLN
2.0000 g | Freq: Once | INTRAVENOUS | Status: AC
  Administered 2024-07-09: 2 g via INTRAVENOUS
  Filled 2024-07-09: qty 50

## 2024-07-09 MED ORDER — LATANOPROST 0.005 % OP SOLN
1.0000 [drp] | Freq: Every day | OPHTHALMIC | Status: DC
  Administered 2024-07-09 – 2024-07-10 (×2): 1 [drp] via OPHTHALMIC
  Filled 2024-07-09: qty 2.5

## 2024-07-09 MED ORDER — SODIUM CHLORIDE 0.9 % IV SOLN
500.0000 mg | Freq: Once | INTRAVENOUS | Status: AC
  Administered 2024-07-09: 500 mg via INTRAVENOUS
  Filled 2024-07-09: qty 5

## 2024-07-09 MED ORDER — PHENOL 1.4 % MT LIQD
1.0000 | OROMUCOSAL | Status: DC | PRN
  Administered 2024-07-09: 1 via OROMUCOSAL
  Filled 2024-07-09: qty 177

## 2024-07-09 MED ORDER — IPRATROPIUM-ALBUTEROL 0.5-2.5 (3) MG/3ML IN SOLN
3.0000 mL | RESPIRATORY_TRACT | Status: DC | PRN
  Administered 2024-07-09 – 2024-07-11 (×4): 3 mL via RESPIRATORY_TRACT
  Filled 2024-07-09 (×4): qty 3

## 2024-07-09 MED ORDER — OXYCODONE HCL 5 MG PO TABS
10.0000 mg | ORAL_TABLET | Freq: Four times a day (QID) | ORAL | Status: DC | PRN
  Administered 2024-07-09 – 2024-07-11 (×7): 10 mg via ORAL
  Filled 2024-07-09 (×7): qty 2

## 2024-07-09 MED ORDER — ONDANSETRON HCL 4 MG PO TABS: 4.0000 mg | ORAL_TABLET | Freq: Four times a day (QID) | ORAL | Status: DC | PRN

## 2024-07-09 MED ORDER — APIXABAN 5 MG PO TABS
5.0000 mg | ORAL_TABLET | Freq: Two times a day (BID) | ORAL | Status: DC
  Administered 2024-07-09 – 2024-07-11 (×5): 5 mg via ORAL
  Filled 2024-07-09 (×5): qty 1

## 2024-07-09 MED ORDER — METOPROLOL SUCCINATE ER 25 MG PO TB24
25.0000 mg | ORAL_TABLET | Freq: Every day | ORAL | Status: DC
  Administered 2024-07-09 – 2024-07-11 (×3): 25 mg via ORAL
  Filled 2024-07-09 (×3): qty 1

## 2024-07-09 MED ORDER — SODIUM CHLORIDE 0.9 % IV SOLN
500.0000 mg | INTRAVENOUS | Status: DC
  Administered 2024-07-10 – 2024-07-11 (×2): 500 mg via INTRAVENOUS
  Filled 2024-07-09 (×2): qty 5

## 2024-07-09 MED ORDER — MENTHOL 3 MG MT LOZG
1.0000 | LOZENGE | OROMUCOSAL | Status: DC | PRN
  Administered 2024-07-09 (×2): 3 mg via ORAL
  Filled 2024-07-09 (×3): qty 9

## 2024-07-09 MED ORDER — TIMOLOL MALEATE 0.5 % OP SOLN
1.0000 [drp] | Freq: Two times a day (BID) | OPHTHALMIC | Status: DC
  Administered 2024-07-09 – 2024-07-11 (×5): 1 [drp] via OPHTHALMIC
  Filled 2024-07-09: qty 5

## 2024-07-09 MED ORDER — SODIUM CHLORIDE 0.9 % IV SOLN: 2.0000 g | INTRAVENOUS | Status: DC

## 2024-07-09 MED ORDER — ONDANSETRON HCL 4 MG/2ML IJ SOLN: 4.0000 mg | Freq: Four times a day (QID) | INTRAMUSCULAR | Status: DC | PRN

## 2024-07-09 NOTE — Evaluation (Signed)
 Physical Therapy Evaluation Patient Details Name: Brett [name] MRN: [id number] DOB: 03/19/1956 Today's Date: 07/09/2024  History of Present Illness  68 yo male presenting 12/2 with acute RF and hypoxia pt workup for COPD exacerbation PMHx: XLIF L3-4, arthritis, congenital hip deformity, glaucoma, neuromuscular disorder, CM, lung CA, COPD adenocarcinoma of lung s/p lobectomy 02/2024, afib on anticoagulation, scoliosis, remote smoker, RA  Clinical Impression  Pt in bed upon arrival of PT, agreeable to evaluation at this time. Prior to admission the pt was independent with mobility without DME in the home, and with use of rollator in community, pt reports baseline of 4L O2 both at rest and with activity. The pt was able to complete bed mobility and transfers without assistance, benefits from CGA to supervision for ambulation, but tolerated with standing rest breaks only and 4LO2. SpO2 95% on 4L at rest and 90% on 4L with gait. The pt is close to his functional baseline, will continue to follow acutely, will be able to return home with family support and resume pulmonary rehab once medically cleared.      If plan is discharge home, recommend the following: A little help with walking and/or transfers;Assist for transportation;Help with stairs or ramp for entrance   Can travel by private vehicle        Equipment Recommendations None recommended by PT  Recommendations for Other Services       Functional Status Assessment Patient has had a recent decline in their functional status and demonstrates the ability to make significant improvements in function in a reasonable and predictable amount of time.     Precautions / Restrictions Precautions Precautions: Fall Recall of Precautions/Restrictions: Intact Precaution/Restrictions Comments: on 4L O2 at baseline Restrictions Weight Bearing Restrictions Per Provider Order: No      Mobility  Bed Mobility Overal bed mobility: Modified  Independent             General bed mobility comments: HOB elevated, increased time but no assist, increased SOB but SpO2 92%    Transfers Overall transfer level: Needs assistance Equipment used: Rollator (4 wheels), Rolling walker (2 wheels) Transfers: Sit to/from Stand Sit to Stand: Contact guard assist, From elevated surface           General transfer comment: CGA for safety, requires elevated EOB due to height    Ambulation/Gait Ambulation/Gait assistance: Contact guard assist, Supervision Gait Distance (Feet): 175 Feet (x4 standing rest breaks) Assistive device: Rollator (4 wheels) Gait Pattern/deviations: Step-through pattern, Decreased stride length, Trunk flexed Gait velocity: decreased Gait velocity interpretation: <1.31 ft/sec, indicative of household ambulator   General Gait Details: rollator lower than ideal further increasing trunk flexion, SpO2 90% after ambulation with 3/4 DOE.     Balance Overall balance assessment: Needs assistance Sitting-balance support: No upper extremity supported, Feet supported Sitting balance-Leahy Scale: Good     Standing balance support: During functional activity, Bilateral upper extremity supported Standing balance-Leahy Scale: Fair Standing balance comment: BUE support for gait, can stand with single UE support                             Pertinent Vitals/Pain Pain Assessment Pain Assessment: No/denies pain    Home Living Family/patient expects to be discharged to:: Private residence Living Arrangements: Children Available Help at Discharge: Family;Available 24 hours/day Type of Home: House Home Access: Stairs to enter Entrance Stairs-Rails: None Entrance Stairs-Number of Steps: 1   Home Layout: One level Home Equipment:  Shower seat;Rollator (4 wheels);Grab bars - tub/shower;Hand held Programmer, Systems (2 wheels);Cane - single point      Prior Function Prior Level of Function :  Independent/Modified Independent             Mobility Comments: Rollator for mobility ADLs Comments: modI, assists daughter at times, has not driven since July     Extremity/Trunk Assessment   Upper Extremity Assessment Upper Extremity Assessment: Overall WFL for tasks assessed    Lower Extremity Assessment Lower Extremity Assessment: Overall WFL for tasks assessed    Cervical / Trunk Assessment Cervical / Trunk Assessment: Normal;Kyphotic  Communication   Communication Communication: No apparent difficulties    Cognition Arousal: Alert Behavior During Therapy: WFL for tasks assessed/performed   PT - Cognitive impairments: No apparent impairments                         Following commands: Intact       Cueing Cueing Techniques: Verbal cues     General Comments General comments (skin integrity, edema, etc.): SpO2 95% on 4L at rest, 90% after gait on 4L        Assessment/Plan    PT Assessment Patient needs continued PT services  PT Problem List Decreased activity tolerance;Decreased balance;Decreased mobility;Cardiopulmonary status limiting activity       PT Treatment Interventions DME instruction;Gait training;Stair training;Functional mobility training;Therapeutic activities;Therapeutic exercise;Balance training    PT Goals (Current goals can be found in the Care Plan section)  Acute Rehab PT Goals Patient Stated Goal: to return home PT Goal Formulation: With patient Time For Goal Achievement: 07/23/24 Potential to Achieve Goals: Good    Frequency Min 2X/week        AM-PAC PT 6 Clicks Mobility  Outcome Measure Help needed turning from your back to your side while in a flat bed without using bedrails?: None Help needed moving from lying on your back to sitting on the side of a flat bed without using bedrails?: A Little Help needed moving to and from a bed to a chair (including a wheelchair)?: A Little Help needed standing up from a  chair using your arms (e.g., wheelchair or bedside chair)?: A Little Help needed to walk in hospital room?: A Little Help needed climbing 3-5 steps with a railing? : A Little 6 Click Score: 19    End of Session Equipment Utilized During Treatment: Gait belt;Oxygen  Activity Tolerance: Patient tolerated treatment well Patient left: in bed;with call bell/phone within reach;with bed alarm set;with family/visitor present (sitting EOB) Nurse Communication: Mobility status PT Visit Diagnosis: Unsteadiness on feet (R26.81);Other abnormalities of gait and mobility (R26.89)    Time: 8649-8575 PT Time Calculation (min) (ACUTE ONLY): 34 min   Charges:   PT Evaluation $PT Eval Low Complexity: 1 Low PT Treatments $Therapeutic Exercise: 8-22 mins PT General Charges $$ ACUTE PT VISIT: 1 Visit         Izetta Call, PT, DPT   Acute Rehabilitation Department Office [phone] Secure Chat Communication Preferred  Izetta JULIANNA Call 07/09/2024, 2:52 PM

## 2024-07-09 NOTE — ED Triage Notes (Signed)
 Pt BIBA from home for c/o SOB, fever, sore throat, and congestion. Pt has hc of lung CA and is currently in pulm rehab. Per EMS pt on 4 L [place] at baseline, was 91% SpO2 on his baseline O2. Pt received 2 duoneb, 200 ml LR and 650 mg tylenol  PTA for temp 101.2 tympanic.

## 2024-07-09 NOTE — Assessment & Plan Note (Signed)
 On Stelara On hold for now in setting of pneumonia

## 2024-07-09 NOTE — Assessment & Plan Note (Signed)
 Rate controlled at present Continue home regimen including metoprolol  and Eliquis  Monitor

## 2024-07-09 NOTE — Care Management Obs Status (Signed)
 MEDICARE OBSERVATION STATUS NOTIFICATION   Patient Details  Name: Brett [name] MRN: [id number] Date of Birth: [birth year]   Medicare Observation Status Notification Given:  Yes Due to precaution Order called the patient Obs notice explained and a copy left in room.    Chrystel Barefield 07/09/2024, 3:30 PM

## 2024-07-09 NOTE — Plan of Care (Signed)
  Problem: Education: Goal: Knowledge of the prescribed therapeutic regimen will improve Outcome: Progressing Goal: Individualized Educational Video(s) Outcome: Not Applicable

## 2024-07-09 NOTE — H&P (Signed)
 History and Physical    Patient: Brett [name] FMW:[id number] DOB: 1956/07/18 DOA: 07/09/2024 DOS: the patient was seen and examined on 07/09/2024 PCP: [name], NP  Patient coming from: Home  Chief Complaint:  Chief Complaint  Patient presents with   Shortness of Breath   HPI: Brett [name] is a 68 y.o. male with medical history significant of COPD, adenocarcinoma of the lung status post lobectomy July 2025, chronic HFpEF, atrial fibrillation on anticoagulation, chronic pain secondary to scoliosis presenting with acute on chronic respiratory failure with hypoxia, pneumonia.  Patient reports progressive onset of shortness of breath, rhinorrhea nasal congestion as well as sore throat over the past 1 to 2 days.  Symptoms progressively worsened with difficulty swallowing as well as significant nasal congestion.  No reported sick contacts.  Positive generalized malaise.  Had worsening cough and shortness of breath with onset of symptoms.  Mild wheezing.  Noted had a lobectomy July 2025 in setting of adenocarcinoma of the lung.  Has worn 4 L nasal cannula chronically thereafter.  No reported active smoking.  Denies any chest pain or belly pain.  No diarrhea.  No focal hemiparesis or confusion.  Has some chronic lower extremity swelling which is relatively unchanged.  No reports of weakness or falls. Presented to the ER afebrile, hemodynamically stable.  Satting in the mid 90s on 4 L.  White count 6.7, hemoglobin 13.1, platelets 189.  Creatinine 1.  Glucose 131.  Troponin within normal limits.  BNP of 223.  Strep negative.  Chest x-ray with noted prior scarring as well as concern for infection.  EKG NSR.  Review of Systems: As mentioned in the history of present illness. All other systems reviewed and are negative.  Past Medical History:  Diagnosis Date   Allergy    Arthritis    Asthma    Atrial fibrillation (HCC) 04/29/2024   Bilateral low back pain without sciatica 09/22/2015    Blood transfusion without reported diagnosis    Cancer (HCC)    Left Lower Lobe Adenocarcinoma   Cardiomyopathy (HCC)    Cervical neck pain with evidence of disc disease 09/22/2015   Chicken pox    Chronic CHF (congestive heart failure) (HCC) 03/26/2024   Congenital hip deformity    Corn of foot 08/09/2022   Degenerative disc disease, cervical 01/29/2015   Dysrhythmia    SVT   Eczema 05/30/2013   Erectile dysfunction 09/22/2015   Glaucoma 05/30/2013   History of cardiac dysrhythmia 05/23/2023   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 08/06/2023   Pain of left hip joint 11/01/2018   Paroxysmal SVT (supraventricular tachycardia) (HCC), incidental finding IntraOp back surgery requiring esmolol ; event monitor November 2024 for short runs, longest episode 17 beats. 06/19/2023   Event monitor November 2024, supraventricular ectopy burden 1.7%.  4 short runs of SVT, longest episode 17 beats.  No symptoms reported     Personal history of congenital hip dysplasia 05/30/2013   Preventative health care 01/29/2015   Rhinitis, allergic 09/22/2015   Rosacea 05/30/2013   Scoliosis deformity of spine 05/05/2020   Spinal stenosis of lumbar region 06/16/2023   Spondylolisthesis at L5-S1 level 09/14/2020   Tobacco abuse 05/30/2013   Past Surgical History:  Procedure  Laterality Date   ANTERIOR LAT LUMBAR FUSION Right 05/09/2023   Procedure: Extreme Lateral Interbody Fusion Lumbar three-four -right;  Surgeon: Louis Shove, MD;  Location: [facility] OR;  Service: Neurosurgery;  Laterality: Right;  3C   BRONCHOSCOPY, WITH BIOPSY USING ELECTROMAGNETIC NAVIGATION Bilateral 01/09/2024   Procedure: ROBOTIC  ASSISTED NAVIGATIONAL BRONCHOSCOPY;  Surgeon: Isadora Hose, MD;  Location: [facility] ORS;  Service: Pulmonary;  Laterality: Bilateral;   COLONOSCOPY  06/17/2015   Pyrtle   COLONOSCOPY  07/20/2020   Pyrtle   COLONOSCOPY  02/15/2024   ENDOBRONCHIAL ULTRASOUND Bilateral 01/09/2024   Procedure: ENDOBRONCHIAL ULTRASOUND (EBUS);  Surgeon: Isadora Hose, MD;  Location: [facility] ORS;  Service: Pulmonary;  Laterality: Bilateral;   HAMMER TOE SURGERY  07/28/2011   Procedure: HAMMER TOE CORRECTION;  Surgeon: Toribio JULIANNA Chancy, MD;  Location: [facility];  Service: Orthopedics;  Laterality: Right;  right 2nd and 4th toes correction hammer toe, capsulotomy metatarsal-phalangeal joints   HERNIA REPAIR Bilateral    HIP SURGERY  1986 & 2010   rt total hip-8/10-multiple rt hip surgeries- had 4 prior to 1986 as a child   INTERCOSTAL NERVE BLOCK Left 02/19/2024   Procedure: BLOCK, NERVE, INTERCOSTAL;  Surgeon: Kerrin Elspeth BROCKS, MD;  Location: [facility] OR;  Service: Thoracic;  Laterality: Left;   JOINT REPLACEMENT  2010   Partial Right Hip   LOBECTOMY, LUNG, ROBOT-ASSISTED, USING VATS Left 02/19/2024   Procedure: LOBECTOMY, LUNG, ROBOT-ASSISTED, USING VATS;  Surgeon: Kerrin Elspeth BROCKS, MD;  Location: [facility] OR;  Service: Thoracic;  Laterality: Left;  ROBOTIC LEFT LOWER LOBECTOMY   lumber fusion  09/2020   POLYPECTOMY     RIGHT/LEFT HEART CATH AND CORONARY ANGIOGRAPHY N/A 05/10/2024   Procedure: RIGHT/LEFT HEART CATH AND CORONARY ANGIOGRAPHY;  Surgeon: Wonda Sharper, MD;  Location: [facility] INVASIVE CV LAB;  Service: Cardiovascular;  Laterality: N/A;   SENTINEL NODE BIOPSY Left 02/19/2024   Procedure: BIOPSY, LYMPH NODE;  Surgeon: Kerrin Elspeth BROCKS, MD;  Location: [facility] OR;  Service: Thoracic;  Laterality: Left;   SPINE SURGERY  09/14/2020   THUMB ARTHROSCOPY  2008   rt   TOTAL HIP ARTHROPLASTY Right 1985   VIDEO BRONCHOSCOPY N/A 02/23/2024   Procedure: VIDEO BRONCHOSCOPY WITHOUT FLUORO;  Surgeon: Kerrin Elspeth BROCKS, MD;  Location: [facility] ENDOSCOPY;  Service: Thoracic;  Laterality: N/A;  Bronch with IBV insertion   VIDEO BRONCHOSCOPY WITH INSERTION OF INTERBRONCHIAL VALVE (IBV) N/A 02/23/2024   Procedure: BRONCHOSCOPY, FLEXIBLE, WITH INTRABRONCHIAL VALVE INSERTION;  Surgeon: Kerrin Elspeth BROCKS, MD;  Location: [facility] ENDOSCOPY;  Service: Thoracic;  Laterality: N/A;   Social History:  reports that he quit smoking about 6 months ago. His smoking use included cigarettes. He started smoking about 50 years ago. He has a 25.2 pack-year smoking history. He has never used smokeless tobacco. He reports current alcohol use of about 14.0 standard drinks of alcohol per week. He reports that he does not use drugs.  No Known Allergies  Family History  Problem Relation Age of Onset   Cancer Mother 39       history of colon cancer   Rosacea Mother    Colon cancer Mother 85   Colon polyps Mother    Rosacea Father    Lung disease Father        ?pulmonary fibrosis   Alcohol abuse Brother    Depression Brother    Colon cancer Maternal Grandmother    Endocrine tumor Daughter        pituitary tumor, POTTS   Thyroid   disease Son        ?hyperthyroid   Cancer Cousin        colon   Colon cancer Cousin    Esophageal cancer Neg Hx    Rectal cancer Neg Hx    Stomach cancer Neg Hx     Prior to Admission medications   Medication Sig Start Date End Date Taking? Authorizing Provider  acetaminophen  (TYLENOL ) 500 MG tablet Take 500 mg by mouth every 6 (six) hours as needed for moderate pain (pain score 4-6).   Yes [provider]  apixaban  (ELIQUIS ) 5 MG TABS tablet Take 1 tablet (5 mg total) by mouth 2 (two) times daily. 04/29/24  Yes [name], [name], MD  aspirin  EC 81 MG tablet Take 1 tablet (81 mg total) by mouth daily. Swallow whole. 12/07/23  Yes [name], [name], MD  calcium  carbonate (TUMS EX) 750 MG chewable tablet Chew 1-2 tablets by mouth 2 (two) times daily as needed for heartburn.   Yes  [provider]  Clotrimazole  1 % OINT Apply to affected area twice daily as needed 03/13/24  Yes O'Sullivan, Melissa, NP  empagliflozin  (JARDIANCE ) 10 MG TABS tablet Take 1 tablet (10 mg total) by mouth daily before breakfast. 07/01/24  Yes [name], [name], MD  fluticasone -salmeterol (WIXELA INHUB) 100-50 MCG/ACT AEPB Inhale 1 puff into the lungs 2 (two) times daily. 04/17/24  Yes [name], Belva, MD  furosemide  (LASIX ) 20 MG tablet Take 1 tablet (20 mg total) by mouth as directed. Take 20 mg daily and an additional 20 mg every other day Patient taking differently: Take 20-40 mg by mouth in the morning. Take one tablet (20mg ) by mouth every other day.  On opposite days, take two tablets (40mg ) by mouth. 04/29/24 07/28/24 Yes [name], [name], MD  ibuprofen  (ADVIL ) 200 MG tablet Take 200 mg by mouth every 6 (six) hours as needed for moderate pain (pain score 4-6).   Yes [provider]  loratadine  (CLARITIN ) 10 MG tablet Take 10 mg by mouth daily.   Yes [provider]  metoprolol  succinate (TOPROL  XL) 25 MG 24 hr tablet Take 1 tablet (25 mg total) by mouth daily. 12/04/23  Yes [name], [name], MD  Multiple Vitamins-Minerals (MULTIVITAMIN GUMMIES ADULT) CHEW Chew 8 each by mouth in the morning. Chew eight gummies by mouth daily in the morning.   Yes [provider]  Oxycodone  HCl 10 MG TABS Take 1 tablet by mouth every 6 (six) hours as needed.   Yes [provider]  OXYGEN  Inhale 4 L/min into the lungs continuous.   Yes [provider]  STELARA 45 MG/0.5ML injection Inject 45 mg into the skin as directed. Every 12 weeks   Yes [provider]  Timolol  Maleate, Once-Daily, 0.5 % SOLN Place 1 drop into both eyes in the morning and at bedtime. 01/29/24  Yes [provider]  Travoprost, BAK Free, (TRAVATAN) 0.004 % SOLN ophthalmic solution Place 1 drop into both eyes in the morning.   Yes [provider]     Physical Exam: Vitals:   07/09/24 0201 07/09/24 0715 07/09/24 0830 07/09/24 0939  BP:  (!) 100/58 114/65 122/62  Pulse:  65 69 72  Resp:  20 20 18   Temp:  98.6 F (37 C) 98.7 F (37.1 C) 98.6 F (37 C)  TempSrc:  Oral Oral   SpO2:  98% 98% 90%  Weight: 80.7 kg   80 kg  Height: 6' 2 (1.88 m)   6'  2 (1.88 m)   Physical Exam Constitutional:      Appearance: He is normal weight.  HENT:     Head: Normocephalic and atraumatic.     Nose: Nose normal.     Mouth/Throat:     Mouth: Mucous membranes are moist.  Eyes:     Pupils: Pupils are equal, round, and reactive to light.  Cardiovascular:     Rate and Rhythm: Normal rate and regular rhythm.  Pulmonary:     Effort: Pulmonary effort is normal.     Breath sounds: Wheezing present.  Abdominal:     General: Bowel sounds are normal.  Musculoskeletal:        General: Normal range of motion.  Skin:    General: Skin is warm.  Neurological:     General: No focal deficit present.  Psychiatric:        Mood and Affect: Mood normal.     Data Reviewed:  There are no new results to review at this time.  DG Chest Portable 1 View EXAM: 1 VIEW(S) XRAY OF THE CHEST 07/09/2024 02:27:00 AM  COMPARISON: 06/04/2024  CLINICAL HISTORY: SOB  FINDINGS:  LUNGS AND PLEURA: Chronic volume loss of left lung. Similar-appearing chronic airspace opacities throughout left lung, most prominent in left mid to lower lung. Blunting of left costophrenic angle and pleural thickening. No pleural effusion. No pneumothorax.  HEART AND MEDIASTINUM: Atherosclerotic plaque. No acute abnormality of the cardiac and mediastinal silhouettes.  BONES AND SOFT TISSUES: No acute osseous abnormality.  IMPRESSION: 1. Similar chronic scarring in the left lower lung. Superimposed infection is difficult to exclude.  Electronically signed by: Norman Gatlin MD 07/09/2024 02:41 AM EST RP Workstation: [id number]  Lab Results  Component Value Date    WBC 6.7 07/09/2024   HGB 13.1 07/09/2024   HCT 39.7 07/09/2024   MCV 98.8 07/09/2024   PLT 189 07/09/2024   Last metabolic panel Lab Results  Component Value Date   GLUCOSE 131 (H) 07/09/2024   NA 136 07/09/2024   K 4.5 07/09/2024   CL 100 07/09/2024   CO2 26 07/09/2024   BUN 18 07/09/2024   CREATININE 1.00 07/09/2024   GFRNONAA >60 07/09/2024   CALCIUM  8.3 (L) 07/09/2024   PROT 6.1 (L) 07/09/2024   ALBUMIN 3.0 (L) 07/09/2024   BILITOT 0.7 07/09/2024   ALKPHOS 50 07/09/2024   AST 30 07/09/2024   ALT 15 07/09/2024   ANIONGAP 10 07/09/2024    Assessment and Plan: Acute on chronic respiratory failure with hypoxia (HCC) CAP Asthma/COPD Progressive SOB, rhinorrhea, nasal congestion and cough over past 24 hours in setting of baseline chronic resp failure on 4L, COPD and lung cancer s/p RLL lobectomy 02/2024 CXR w/ some concern for superimposed infection  COVID flu and RSV negative Will plan for empiric coverage with Rocephin and azithromycin  Add on blood and respiratory cultures including expanded respiratory panel with predominant concern for viral etiology given presentation PSI score of 4-add on steroids in setting of overlapping COPD with mild wheezing Currently stable on 4 L which is baseline for patient in terms of O2 demand As needed DuoNebs Monitor   Polyarthritis with positive rheumatoid factor (HCC) On Stelara On hold for now in setting of pneumonia  Atrial fibrillation (HCC) Rate controlled at present Continue home regimen including metoprolol  and Eliquis  Monitor  Chronic CHF (congestive heart failure) (HCC) 2D echo December 2024 with EF of 50 to 55% and grade 1 diastolic dysfunction Appears fairly euvolemic though with trace lower extremity  swelling BNP 223-chest x-ray without overt volume overload Continue home Lasix  for now Monitor  Primary adenocarcinoma of lower lobe of left lung (HCC) History of stage 1A non-small cell lung adenocarcinoma,  status post left lower lobectomy 02/2024  no chemotherapy needed due to early staging.        Advance Care Planning:   Code Status: Full Code   Consults: None   Family Communication: No family at the bedside   Severity of Illness: The appropriate patient status for this patient is OBSERVATION. Observation status is judged to be reasonable and necessary in order to provide the required intensity of service to ensure the patient's safety. The patient's presenting symptoms, physical exam findings, and initial radiographic and laboratory data in the context of their medical condition is felt to place them at decreased risk for further clinical deterioration. Furthermore, it is anticipated that the patient will be medically stable for discharge from the hospital within 2 midnights of admission.   Author: Elspeth JINNY Masters, MD 07/09/2024 10:17 AM  For on call review [email or web address].

## 2024-07-09 NOTE — Progress Notes (Signed)
 NEW ADMISSION NOTE New Admission Note:   Arrival Method:  Patient arrived from ED Mental Orientation: alert and oriented x 4. Telemetry: 5M21 NSR Assessment: Completed Skin: warm and dry IV: L AC SL Pain: Currently sleeping Tubes: [place] 4L O2 Safety Measures: Safety Fall Prevention Plan has been given, discussed and implemented. Admission: In progress [facility] [place] Orientation: Patient has been orientated to the room, unit and staff.  Family: None at bedside.  Orders have been reviewed and implemented. Will continue to monitor the patient. Call light has been placed within reach and bed alarm has been activated.   Franciso Bodily, RN

## 2024-07-09 NOTE — Assessment & Plan Note (Signed)
 CAP Asthma/COPD Progressive SOB, rhinorrhea, nasal congestion and cough over past 24 hours in setting of baseline chronic resp failure on 4L, COPD and lung cancer s/p RLL lobectomy 02/2024 CXR w/ some concern for superimposed infection  COVID flu and RSV negative Will plan for empiric coverage with Rocephin and azithromycin  Add on blood and respiratory cultures including expanded respiratory panel with predominant concern for viral etiology given presentation PSI score of 4-add on steroids in setting of overlapping COPD with mild wheezing Currently stable on 4 L which is baseline for patient in terms of O2 demand As needed DuoNebs Monitor

## 2024-07-09 NOTE — ED Notes (Signed)
 Transport called.

## 2024-07-09 NOTE — ED Notes (Signed)
 Floor called to notify patient is coming up

## 2024-07-09 NOTE — ED Provider Notes (Signed)
 [place] EMERGENCY DEPARTMENT AT [facility] Provider Note   CSN: [id number] Arrival date & time: 07/09/24  9847     History Chief Complaint  Patient presents with   Shortness of Breath    HPI Brett [name] is a 68 y.o. male presenting for fever cough congestion and severe SOB. All developed over the past 48 hours. Substantially cough with production. HX Tobacco, CHF, Lunc Cancer s/p lobectomy, COPD, RA.  Patient's recorded medical, surgical, social, medication list and allergies were reviewed in the Snapshot window as part of the initial history.   Review of Systems   Review of Systems  Constitutional:  Negative for chills and fever.  HENT:  Negative for ear pain and sore throat.   Eyes:  Negative for pain and visual disturbance.  Respiratory:  Negative for cough and shortness of breath.   Cardiovascular:  Negative for chest pain and palpitations.  Gastrointestinal:  Negative for abdominal pain and vomiting.  Genitourinary:  Negative for dysuria and hematuria.  Musculoskeletal:  Negative for arthralgias and back pain.  Skin:  Negative for color change and rash.  Neurological:  Negative for seizures and syncope.  All other systems reviewed and are negative.   Physical Exam Updated Vital Signs BP (!) 101/90 (BP Location: Right Arm)   Pulse 80   Temp 99 F (37.2 C) (Oral)   Resp 20   Ht 6' 2 (1.88 m)   Wt 80.7 kg   SpO2 97%   BMI 22.85 kg/m  Physical Exam Vitals and nursing note reviewed.  Constitutional:      General: He is not in acute distress.    Appearance: He is well-developed.  HENT:     Head: Normocephalic and atraumatic.  Eyes:     Conjunctiva/sclera: Conjunctivae normal.  Cardiovascular:     Rate and Rhythm: Normal rate and regular rhythm.     Heart sounds: No murmur heard. Pulmonary:     Effort: Pulmonary effort is normal. No respiratory distress.     Breath sounds: Normal breath sounds.  Abdominal:     Palpations: Abdomen is  soft.     Tenderness: There is no abdominal tenderness.  Musculoskeletal:        General: No swelling.     Cervical back: Neck supple.  Skin:    General: Skin is warm and dry.     Capillary Refill: Capillary refill takes less than 2 seconds.  Neurological:     Mental Status: He is alert.  Psychiatric:        Mood and Affect: Mood normal.      ED Course/ Medical Decision Making/ A&P    Procedures Procedures   Medications Ordered in ED Medications  magnesium  sulfate IVPB 2 g 50 mL (2 g Intravenous New Bag/Given 07/09/24 0555)  cefTRIAXone (ROCEPHIN) 1 g in sodium chloride  0.9 % 100 mL IVPB (0 g Intravenous Stopped 07/09/24 0443)  azithromycin  (ZITHROMAX ) 500 mg in sodium chloride  0.9 % 250 mL IVPB (0 mg Intravenous Stopped 07/09/24 0552)  acetaminophen  (TYLENOL ) tablet 1,000 mg (1,000 mg Oral Given 07/09/24 0500)  oxyCODONE  (Oxy IR/ROXICODONE ) immediate release tablet 5 mg (5 mg Oral Given 07/09/24 0500)    Medical Decision Making:   Brett [name] is a 68 y.o. male with a history of COPD, who presented to the ED today with acute on chronic SOB. They are endorsing worsening of their baseline dyspnea over the past 48 hours. Their baseline is a 2L O2 requirement. At their baseline  they are able to get around the house and they are not able to at this time.   On my initial exam, the pt was SOB and tachypneic. Audible wheezing and grossly decreased breath sounds appreciated.  They are endorsing increased sputum production.    Reviewed and confirmed nursing documentation for past medical history, family history, social history.    Initial Assessment:   With the patient's presentation of SOB in the above setting, most likely diagnosis is COPD Exacerbation. Other diagnoses were considered including (but not limited to) CAP, PE, ACS, viral infection, PTX. These are considered less likely due to history of present illness and physical exam findings.   This is most consistent with an acute  life/limb threatening illness complicated by underlying chronic conditions.  Initial Plan:  Empiric treatment of patient's symptoms with immediate initiation of inhaled bronchodilators and IV steroids. Given advanced nature of patient's presentation, will proceed with IV magnesium  as a rescue therapy.   Evaluation for ACS with EKG and delta troponin  Evaluation for infectious versus intrathoracic abnormality with chest x-ray  Evaluation for volume overload with BNP  Screening labs including CBC and Metabolic panel to evaluate for infectious or metabolic etiology of disease.  Patient's Wells score is low and patient does not warrant further objective evaluation for PE based on consistency of presentation of alternative diagnosis.  Objective evaluation as below reviewed   Initial Study Results:   Laboratory  All laboratory results reviewed without evidence of clinically relevant pathology.    EKG EKG was reviewed independently. Rate, rhythm, axis, intervals all examined and without medically relevant abnormality. ST segments without concerns for elevations.    Radiology:  All images reviewed independently. Agree with radiology report at this time.   DG Chest Portable 1 View Result Date: 07/09/2024 EXAM: 1 VIEW(S) XRAY OF THE CHEST 07/09/2024 02:27:00 AM COMPARISON: 06/04/2024 CLINICAL HISTORY: SOB FINDINGS: LUNGS AND PLEURA: Chronic volume loss of left lung. Similar-appearing chronic airspace opacities throughout left lung, most prominent in left mid to lower lung. Blunting of left costophrenic angle and pleural thickening. No pleural effusion. No pneumothorax. HEART AND MEDIASTINUM: Atherosclerotic plaque. No acute abnormality of the cardiac and mediastinal silhouettes. BONES AND SOFT TISSUES: No acute osseous abnormality. IMPRESSION: 1. Similar chronic scarring in the left lower lung. Superimposed infection is difficult to exclude. Electronically signed by: Norman Gatlin MD 07/09/2024 02:41 AM  EST RP Workstation: [id number]   Final Assessment and Plan:   After initiation of medical therapies, patient is grossly improved and no longer in acute distress.    Given the advanced nature of the patient's presentation, patient will require advanced care and admission was arranged.      Clinical Impression:  1. COPD exacerbation (HCC)   2. SOB (shortness of breath)      Admit   Final Clinical Impression(s) / ED Diagnoses Final diagnoses:  COPD exacerbation (HCC)  SOB (shortness of breath)    Rx / DC Orders ED Discharge Orders     None         Jerral Meth, MD 07/09/24 [phone]

## 2024-07-09 NOTE — Assessment & Plan Note (Addendum)
 History of stage 1A non-small cell lung adenocarcinoma, status post left lower lobectomy 02/2024  no chemotherapy needed due to early staging.

## 2024-07-09 NOTE — Assessment & Plan Note (Signed)
 2D echo December 2024 with EF of 50 to 55% and grade 1 diastolic dysfunction Appears fairly euvolemic though with trace lower extremity swelling BNP 223-chest x-ray without overt volume overload Continue home Lasix  for now Monitor

## 2024-07-10 ENCOUNTER — Ambulatory Visit

## 2024-07-10 DIAGNOSIS — J22 Unspecified acute lower respiratory infection: Secondary | ICD-10-CM | POA: Diagnosis not present

## 2024-07-10 LAB — CBC
HCT: 37.4 % — ABNORMAL LOW (ref 39.0–52.0)
Hemoglobin: 12.4 g/dL — ABNORMAL LOW (ref 13.0–17.0)
MCH: 32.1 pg (ref 26.0–34.0)
MCHC: 33.2 g/dL (ref 30.0–36.0)
MCV: 96.9 fL (ref 80.0–100.0)
Platelets: 157 K/uL (ref 150–400)
RBC: 3.86 MIL/uL — ABNORMAL LOW (ref 4.22–5.81)
RDW: 13.5 % (ref 11.5–15.5)
WBC: 11.6 K/uL — ABNORMAL HIGH (ref 4.0–10.5)
nRBC: 0 % (ref 0.0–0.2)

## 2024-07-10 LAB — COMPREHENSIVE METABOLIC PANEL WITH GFR
ALT: 13 U/L (ref 0–44)
AST: 19 U/L (ref 15–41)
Albumin: 2.7 g/dL — ABNORMAL LOW (ref 3.5–5.0)
Alkaline Phosphatase: 43 U/L (ref 38–126)
Anion gap: 12 (ref 5–15)
BUN: 9 mg/dL (ref 8–23)
CO2: 28 mmol/L (ref 22–32)
Calcium: 8.9 mg/dL (ref 8.9–10.3)
Chloride: 100 mmol/L (ref 98–111)
Creatinine, Ser: 0.98 mg/dL (ref 0.61–1.24)
GFR, Estimated: >60 mL/min (ref >60)
Glucose, Bld: 113 mg/dL — ABNORMAL HIGH (ref 70–99)
Potassium: 4.2 mmol/L (ref 3.5–5.1)
Sodium: 140 mmol/L (ref 135–145)
Total Bilirubin: 0.4 mg/dL (ref 0.0–1.2)
Total Protein: 6.1 g/dL — ABNORMAL LOW (ref 6.5–8.1)

## 2024-07-10 MED ORDER — ACETAMINOPHEN 500 MG PO TABS: 500.0000 mg | ORAL_TABLET | Freq: Four times a day (QID) | ORAL | Status: DC | PRN

## 2024-07-10 MED ORDER — CLOTRIMAZOLE 1 % EX CREA
1.0000 | TOPICAL_CREAM | Freq: Two times a day (BID) | CUTANEOUS | Status: DC
  Filled 2024-07-10: qty 15

## 2024-07-10 MED ORDER — ASPIRIN 81 MG PO TBEC
81.0000 mg | DELAYED_RELEASE_TABLET | Freq: Every day | ORAL | Status: DC
  Administered 2024-07-10 – 2024-07-11 (×2): 81 mg via ORAL
  Filled 2024-07-10 (×2): qty 1

## 2024-07-10 NOTE — Evaluation (Signed)
 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 Pt currently with functional limitations due to the deficits listed below (see OT Problem List). Prior to admit, pt reports that he was attending pulmonary rehab. He lives at home with his daughter and is Mod I-independent for ADL tasks. Utilizes Rolator in community only and is on 4L O2 d/t COPD. Pt currently is experiencing decreased endurance and activity tolerance when completing functional mobility and ADL tasks. Pt will benefit from acute skilled OT to increase their safety and independence with ADL and functional mobility for ADL to facilitate discharge. Recommend pt return to pulmonary rehab once discharged as he is close to his baseline and should progress during his acute stay.       If plan is discharge home, recommend the following:   Assist for transportation;Assistance with cooking/housework     Functional Status Assessment   Patient has had a recent decline in their functional status and demonstrates the ability to make significant improvements in function in a reasonable and predictable amount of time.     Equipment Recommendations   None recommended by OT      Precautions/Restrictions   Precautions Precautions: Fall Recall of Precautions/Restrictions: Intact Precaution/Restrictions Comments: on 4L O2 at baseline Restrictions Weight Bearing Restrictions Per Provider Order: No     Mobility Bed Mobility Overal bed mobility: Modified Independent      Transfers Overall transfer level: Needs assistance Equipment used: Rolling walker (2 wheels) Transfers: Sit to/from Stand Sit to Stand: Supervision           General transfer comment: Pt reports that he has been getting up and walking to the bathroom with RW.      Balance Overall balance assessment: Needs assistance Sitting-balance support: No upper extremity supported, Feet supported Sitting balance-Leahy Scale: Good      ADL either performed or assessed with clinical judgement   ADL Overall ADL's : Modified independent       General ADL Comments: Requires increased time d/t decreased endurance and easily fatigues. Quickly becomes SOB with any activity.     Vision Baseline Vision/History: 1 Wears glasses Ability to See in Adequate Light: 0 Adequate Patient Visual Report: No change from baseline Vision Assessment?: No apparent visual deficits     Perception Perception: Not tested       Praxis Praxis: Not tested       Pertinent Vitals/Pain Pain Assessment Pain Assessment: No/denies pain     Extremity/Trunk Assessment Upper Extremity Assessment Upper Extremity Assessment: Overall WFL for tasks assessed   Lower Extremity Assessment Lower Extremity Assessment: Defer to PT evaluation   Cervical / Trunk Assessment Cervical / Trunk Assessment: Kyphotic   Communication Communication Communication: No apparent difficulties   Cognition Arousal: Alert Behavior During Therapy: WFL for tasks assessed/performed Cognition: No apparent impairments      Following commands: Intact       Cueing  General Comments   Cueing Techniques: Verbal cues              Home Living Family/patient expects to be discharged to:: Private residence Living Arrangements:  Children Available Help at Discharge: Family;Available 24 hours/day Type of Home: House Home Access: Stairs to enter [employer] of Steps: 1 Entrance Stairs-Rails: None Home Layout: One  level     Bathroom Shower/Tub: Producer, Television/film/video: Handicapped height Bathroom Accessibility: No   Home Equipment: Educational Psychologist (4 wheels);Grab bars - tub/shower;Hand held Programmer, Systems (2 wheels);Cane - single point   Additional Comments: Bathroom is almost finished with renovations. Will have a flip down shower seat      Prior Functioning/Environment Prior Level of Function : Independent/Modified Independent  Mobility Comments: Rollator for mobility ADLs Comments: mod I, assists daughter at times, has not driven since July    OT Problem List: Cardiopulmonary status limiting activity;Decreased activity tolerance   OT Treatment/Interventions: Patient/family education;Energy conservation      OT Goals(Current goals can be found in the care plan section)   Acute Rehab OT Goals Patient Stated Goal: to get better OT Goal Formulation: With patient Time For Goal Achievement: 07/17/24 Potential to Achieve Goals: Good   OT Frequency:  Min 2X/week       AM-PAC OT 6 Clicks Daily Activity     Outcome Measure Help from another person eating meals?: None Help from another person taking care of personal grooming?: None Help from another person toileting, which includes using toliet, bedpan, or urinal?: None Help from another person bathing (including washing, rinsing, drying)?: None Help from another person to put on and taking off regular upper body clothing?: None Help from another person to put on and taking off regular lower body clothing?: None 6 Click Score: 24   End of Session Equipment Utilized During Treatment: Oxygen   Activity Tolerance: Patient limited by fatigue;Other (comment) (Limited by SOB) Patient left: in bed;with call bell/phone within reach;with family/visitor present  OT Visit Diagnosis: Muscle weakness (generalized) (M62.81)                Time: 8499-8477 OT Time Calculation (min): 22 min Charges:  OT General  Charges $OT Visit: 1 Visit OT Evaluation $OT Eval Low Complexity: 1 Low  Leita Howell, OTR/L,CBIS  Supplemental OT - [facility] and [facility] Secure Chat Preferred    [name], Leita BIRCH 07/10/2024, 3:42 PM

## 2024-07-10 NOTE — Progress Notes (Addendum)
 [facility]   ROUNDING   NOTE Chanel S Dewoody FMW:[id number]  DOB: [birth year]  DOA: 07/09/2024  PCP: [name], NP  07/10/2024,10:28 AM  LOS: 1 day    Code Status: Full code     from: Home   68 year old male Former smoker COPD baseline 4 L Psoriasis  on Stelara for the past year Paroxysmal SVT EF 50-55% 2024 with HFpEF Previous L3-4 decompression and fusion by Dr. Victory Boers 05/2023 Admission 7/14-7/27 2025 for lobectomy VATS under Dr. 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  Brought to emergency room 12/2 with fever rhinorrhea cough congestion severe SOB received DuoNeb 200 cc fluid Tylenol  for Tmax 101 Admitted overall for concerns of bacterial pneumonia in the setting of acute hypoxic respiratory failure and eventually found to have positive parainfluenza virus 1  Sodium 136 potassium 4.5 BUN/creatinine 18/1.0 alk phos 50 be AST/ALT 30/15 BNP 222 WBC 6.7 hemoglobin 13.1 platelet 189   Pertinent imaging/studies till date  Portable CXR similar chronic scarring left lower lung superimposed infection difficult to confirm 12/2 CT indistinct consolidation posteriorly in the right lower lobe suspicious for pneumonia, confluent interstitial mild airspace opacity posteriorly in the left upper lobe?  Aspiration pneumonitis pneumonia with airway plugging enlarged anterior paratracheal subcarinal right hilar lymph nodes likely reactive-severe emphysema   Assessment  & Plan :    Acute respiratory failure secondary to aspiration pneumonia versus pneumonia superimposed on underlying lung malignancy Immunocompromised patient secondary to malignancy/polyarthritis Strep pneumo negative Legionella pending, BCx 2 pending Parainfluenza virus 1 positive on RVP Continues on azithromycin  500 ceftriaxone 2 g every 24 and would rapidly narrow in the next several days to oral equivalent ?  Needs speech therapy eval if overt coughing Periodic labs Given elevated PSI  score steroids added 40 mg prednisone which may account for leukocytosis Wean oxygen  to baseline 4 L as able  HFpEF EF 50-55% on echo NYHA class III-IV stage C NICM-baseline weight 167 pounds Previous cardiomyopathy on cardiac MRI EF 43% Paroxysmal SVT during surgery 2024 Hold fluids at this time BNP slightly elevated, holding as needed daily Lasix  and resume in the next several days (Home regimen includes Lasix  40 alternating with 20 daily) mainly for left leg swelling Jardiance  10 has previously caused some balanitis and groin rash-would defer to PCP to continue Continue Toprol -XL 25 Continues on apixaban  5 twice daily  Stage IIb lung malignancy non-small cell followed by Dr. Gatha, Dr. Acey Dgyalli No need for adjuvant chemotherapy due to early stage complete resection Outpatient follow-up in 6 months reeval sometime in January  Positive ANA?  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 Psoriasis Await further workup with Dr. Jeannetta which is scheduled for next year ? 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 surgeries  Time 40   Subjective:   Some weird sensation in L arm radiating--chronic SOB about the same but feels a little stronger  Objective + exam Vitals:   07/09/24 1615 07/09/24  2104 07/10/24 0440 07/10/24 0752  BP: 120/68 (!) 107/57 129/69 124/66  Pulse: 81 72 60   Resp: 18 18 18 18   Temp: 98.6 F (37 C) 98.2 F (36.8 C) 99 F (37.2  C) 98.6 F (37 C)  TempSrc:  Oral Oral   SpO2: 94% 94% 97%   Weight:      Height:       Filed Weights   07/09/24 0201 07/09/24 0939  Weight: 80.7 kg 80 kg     Examination: Eomi ncat no focal deficit Cta b mild posterolateral wheeze Abd soft nt nd no rebound no guard S1-S2 no murmur no rub or gallop seems to be in sinus Power is 5/5      Scheduled Meds:  apixaban   5 mg Oral BID   fluticasone  furoate-vilanterol  1 puff Inhalation Daily   latanoprost   1 drop Both Eyes QHS   metoprolol  succinate  25 mg Oral Daily   predniSONE   40 mg Oral Q breakfast   timolol   1 drop Both Eyes BID   Continuous Infusions:  azithromycin  500 mg (07/10/24 0854)   cefTRIAXone  (ROCEPHIN )  IV 2 g (07/09/24 2231)   furosemide , ipratropium-albuterol , menthol , ondansetron  **OR** ondansetron  (ZOFRAN ) IV, mouth rinse, oxyCODONE , phenol  Jai-Gurmukh Amarie Tarte, MD  [facility]

## 2024-07-10 NOTE — Progress Notes (Signed)
 Mobility Specialist: Progress Note   07/10/24 1553  Mobility  Activity Ambulated with assistance  Level of Assistance Standby assist, set-up cues, supervision of patient - no hands on  Assistive Device Front wheel walker  Distance Ambulated (ft) 100 ft  Activity Response Tolerated well  Mobility Referral Yes  Mobility visit 1 Mobility  Mobility Specialist Start Time (ACUTE ONLY) 1040  Mobility Specialist Stop Time (ACUTE ONLY) 1100  Mobility Specialist Time Calculation (min) (ACUTE ONLY) 20 min    During Mobility: SpO2 91-95% 4LO2  Pt received in bed, pleasant and agreeable to mobility session. SV throughout. Very fatigued during session. Ambulated a few feet into the hallway and then needed to take a seated break. SpO2 91% on 4LO2. After seated rest, ambulated down the hallway and returned to room to the BR. BM successful, pericare done ind. Ambulated back to bed. Left EOB with all needs met, call bell in reach.   Ileana Lute Mobility Specialist Please contact via SecureChat or Rehab office at [phone]

## 2024-07-11 ENCOUNTER — Encounter (HOSPITAL_COMMUNITY)

## 2024-07-11 ENCOUNTER — Other Ambulatory Visit (HOSPITAL_COMMUNITY): Payer: Self-pay

## 2024-07-11 ENCOUNTER — Telehealth (HOSPITAL_COMMUNITY): Payer: Self-pay

## 2024-07-11 ENCOUNTER — Encounter (HOSPITAL_COMMUNITY): Payer: Self-pay

## 2024-07-11 DIAGNOSIS — J22 Unspecified acute lower respiratory infection: Secondary | ICD-10-CM | POA: Diagnosis not present

## 2024-07-11 MED ORDER — DOXYCYCLINE HYCLATE 100 MG PO TABS
100.0000 mg | ORAL_TABLET | Freq: Two times a day (BID) | ORAL | Status: DC
  Administered 2024-07-11: 100 mg via ORAL
  Filled 2024-07-11: qty 1

## 2024-07-11 MED ORDER — PREDNISONE 10 MG PO TABS: 10.0000 mg | ORAL_TABLET | Freq: Every day | ORAL | Status: DC

## 2024-07-11 MED ORDER — ALBUTEROL SULFATE HFA 108 (90 BASE) MCG/ACT IN AERS
2.0000 | INHALATION_SPRAY | Freq: Four times a day (QID) | RESPIRATORY_TRACT | 2 refills | Status: AC | PRN
  Filled 2024-07-11: qty 6.7, 25d supply, fill #0

## 2024-07-11 MED ORDER — SPACER/AERO-HOLDING CHAMBERS DEVI
1.0000 | Freq: Four times a day (QID) | 0 refills | Status: AC | PRN
  Filled 2024-07-11: qty 1, fill #0

## 2024-07-11 MED ORDER — DOXYCYCLINE HYCLATE 100 MG PO TABS
100.0000 mg | ORAL_TABLET | Freq: Two times a day (BID) | ORAL | 0 refills | Status: DC
  Filled 2024-07-11: qty 2, 1d supply, fill #0

## 2024-07-11 MED ORDER — PREDNISONE 10 MG PO TABS
10.0000 mg | ORAL_TABLET | Freq: Every day | ORAL | 0 refills | Status: DC
  Filled 2024-07-11: qty 5, 5d supply, fill #0

## 2024-07-11 NOTE — TOC CM/SW Note (Signed)
 Transition of Care [facility]) - Inpatient Brief Assessment   Patient Details  Name: Brett [name] MRN: [id number] Date of Birth: 11-30-1955  [facility]) CM/SW Contact:    Tom-Johnson, Harvest Muskrat, RN Phone Number: 07/11/2024, 9:15 AM   Clinical Narrative:  Patient presented to the ED with worsening Cough, Generalized Weakness, Shortness Of Breath, Nasal Congestion, difficulty Swallowing and Sore Throat. Chest X-ray showed prior Scarring as well as concern for infection. Has hx of CHF, COPD, RA, Lung Cancer s/p Lobectomy, Tobacco use. Admitted with Acute Respiratory infection, on IV and oral Abx, Neb tx, Inhaler, Prednisone. Patient is on 4L O2 baseline, gets his O2 supplies from [employer].    CM spoke with patient at bedside about needs for post hospital transition. Patient states he lives with his daughter, Brett [name]. Has three supportive children. Retired, independent with care prior to admission. Was able to drive until July of this year, now Brett [name] transports to and from appointments. Has a cane, walker, rollator, shower seat at home.  PCP is Brett Setter, NP and uses [facility] on [address].  Patient is currently active with [facility] Pulmonary rehab. PT/OT recommends patient to continue at discharge. CM called Pulmonary Rehab [phone]) and spoke with Brett [name]. Brett [name] requests patient f/u with his Pulmonologist who will clear him to continue with outpatient Pulmonary Rehab. CM called Dr. Clydene office [phone]) and spoke with Brett [name] and scheduled a f/u appointment, info on AVS.  Patient not Medically ready for discharge.  CM will continue to follow as patient progresses with care towards discharge.       Transition of Care Asessment: Insurance and Status: Insurance coverage has been reviewed Patient has primary care physician: Yes Home environment has been reviewed: Yes Prior level of function:: Modified Independent Prior/Current Home Services: No  current home services (Outpatient Pulmonary Rehab.) Social Drivers of Health Review: SDOH reviewed no interventions necessary Readmission risk has been reviewed: Yes Transition of care needs: transition of care needs identified, TOC will continue to follow

## 2024-07-11 NOTE — Plan of Care (Signed)
  Problem: Education: Goal: Knowledge of disease or condition will improve Outcome: Completed/Met Goal: Knowledge of the prescribed therapeutic regimen will improve Outcome: Completed/Met   

## 2024-07-11 NOTE — Progress Notes (Signed)
 RD unable to follow-up with patient within desired timeline during pulmonary rehab due to recent hospital admission. Will plan to reassess once pt able to return to pulmonary rehab program.

## 2024-07-11 NOTE — Telephone Encounter (Signed)
 Received phone call from Case worker Daphne.  She stated that patient is in the hospital and will possibly be discharged within the next couple of days.  They are interested in extending the Pulmonary rehab program.  Advised that once he is cleared from F/U appointment, then he will be able to continue and extend Rehab. She stated that she would get F/U schedule with Dr Isadora office.

## 2024-07-11 NOTE — Progress Notes (Signed)
 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 Pt performing warm up exercise seated edge of bed and ambulating 180 ft with a RW without physical assist. SpO2 94-95% on 4L O2. Reports compliance with IS. Recommendation of OP pulmonary rehab at d/c.    If plan is discharge home, recommend the following: A little help with walking and/or transfers;Assist for transportation;Help with stairs or ramp for entrance   Can travel by private vehicle        Equipment Recommendations  None recommended by PT    Recommendations for Other Services       Precautions / Restrictions Precautions Precautions: Fall Recall of Precautions/Restrictions: Intact Precaution/Restrictions Comments: on 4L O2 at baseline Restrictions Weight Bearing Restrictions Per Provider Order: No     Mobility  Bed Mobility Overal bed mobility: Modified Independent                  Transfers Overall transfer level: Modified independent Equipment used: Rolling walker (2 wheels)                    Ambulation/Gait Ambulation/Gait assistance: Supervision Gait Distance (Feet): 180 Feet Assistive device: Rolling walker (2 wheels) Gait Pattern/deviations: Step-through pattern, Decreased stride length, Trunk flexed Gait velocity: decreased     General Gait Details: Verbal cues for pursed lip breathing   Stairs             Wheelchair Mobility     Tilt Bed    Modified  Rankin (Stroke Patients Only)       Balance Overall balance assessment: Needs assistance Sitting-balance support: No upper extremity supported, Feet supported Sitting balance-Leahy Scale: Good     Standing balance support: During functional activity, Bilateral upper extremity supported Standing balance-Leahy Scale: Fair                              Hotel Manager: No apparent difficulties  Cognition Arousal: Alert Behavior During Therapy: WFL for tasks assessed/performed   PT - Cognitive impairments: No apparent impairments                         Following commands: Intact      Cueing Cueing Techniques: Verbal cues  Exercises General Exercises - Lower Extremity Long Arc Quad: Both, 10 reps, Seated Hip Flexion/Marching: Both, 10 reps, Seated Other Exercises Other Exercises: Scapular retractions x 5 seated    General Comments        Pertinent Vitals/Pain Pain Assessment Pain Assessment: Faces Faces Pain Scale: Hurts a little bit Pain Location: discomfort in chest from coughing Pain Descriptors / Indicators: Discomfort, Sore Pain Intervention(s): Monitored during session, RN gave pain meds during session    Home Living                          Prior Function            PT Goals (current goals can now  be found in the care plan section) Acute Rehab PT Goals Patient Stated Goal: to return home Potential to Achieve Goals: Good Progress towards PT goals: Progressing toward goals    Frequency    Min 2X/week      PT Plan      Co-evaluation              AM-PAC PT 6 Clicks Mobility   Outcome Measure  Help needed turning from your back to your side while in a flat bed without using bedrails?: None Help needed moving from lying on your back to sitting on the side of a flat bed without using bedrails?: None Help needed moving to and from a bed to a chair (including a wheelchair)?:  None Help needed standing up from a chair using your arms (e.g., wheelchair or bedside chair)?: None Help needed to walk in hospital room?: A Little Help needed climbing 3-5 steps with a railing? : A Little 6 Click Score: 22    End of Session Equipment Utilized During Treatment: Oxygen  Activity Tolerance: Patient tolerated treatment well Patient left: in bed;with call bell/phone within reach Nurse Communication: Mobility status PT Visit Diagnosis: Unsteadiness on feet (R26.81);Other abnormalities of gait and mobility (R26.89)     Time: 9077-9047 PT Time Calculation (min) (ACUTE ONLY): 30 min  Charges:    $Therapeutic Activity: 23-37 mins PT General Charges $$ ACUTE PT VISIT: 1 Visit                     Aleck Daring, PT, DPT Acute Rehabilitation Services Office [phone]    Aleck ONEIDA Daring 07/11/2024, 11:09 AM

## 2024-07-11 NOTE — Progress Notes (Signed)
 DISCHARGE NOTE HOME Brett [name] to be discharged Home per MD order. Discussed prescriptions and follow up appointments with the patient. Prescriptions given to patient; medication list explained in detail. Patient verbalized understanding.  Skin clean, dry and intact without evidence of skin break down, no evidence of skin tears noted. IV catheter discontinued intact. Site without signs and symptoms of complications. Dressing and pressure applied. Pt denies pain at the site currently. No complaints noted.  Patient free of lines, drains, and wounds.   An After Visit Summary (AVS) was printed and given to the patient. Patient escorted via wheelchair, and discharged home via private auto.  Doyal Sias, RN

## 2024-07-11 NOTE — TOC Transition Note (Signed)
 Transition of Care [facility]) - Discharge Note   Patient Details  Name: Brett [name] MRN: [id number] Date of Birth: 1956/02/06  Transition of [facility]) CM/SW Contact:  Tom-Johnson, Harvest Muskrat, RN Phone Number: 07/11/2024, 1:12 PM   Clinical Narrative:     Patient is scheduled for discharge today.  Readmission Risk Assessment done. Outpatient f/u, hospital f/u and discharge instructions on AVS. Prescriptions sent to [facility] pharmacy and patient will receive meds prior discharge. Daughter, Clotilda will bring patient's portable O2 tank and will transport at discharge.  No further ICM needs noted.      Final next level of care: OP Rehab Barriers to Discharge: Barriers Resolved   Patient Goals and CMS Choice Patient states their goals for this hospitalization and ongoing recovery are:: To return home CMS Medicare.gov Compare Post Acute Care list provided to:: Patient Choice offered to / list presented to : Patient      Discharge Placement                Patient to be transferred to facility by: Daughter Name of family member notified: [facility]    Discharge Plan and Services Additional resources added to the After Visit Summary for                  DME Arranged: N/A DME Agency: NA       HH Arranged: NA HH Agency: NA        Social Drivers of Health (SDOH) Interventions SDOH Screenings   Food Insecurity: No Food Insecurity (07/09/2024)  Housing: Low Risk  (07/09/2024)  Transportation Needs: No Transportation Needs (07/09/2024)  Utilities: Not At Risk (07/09/2024)  Alcohol Screen: Low Risk  (05/31/2024)  Depression (PHQ2-9): Low Risk  (04/26/2024)  Financial Resource Strain: Low Risk  (05/31/2024)  Physical Activity: Insufficiently Active (05/31/2024)  Social Connections: Moderately Integrated (07/09/2024)  Stress: No Stress Concern Present (05/31/2024)  Tobacco Use: Medium Risk (07/09/2024)     Readmission Risk Interventions    07/11/2024    9:14 AM   Readmission Risk Prevention Plan  Transportation Screening Complete  PCP or Specialist Appt within 5-7 Days Complete  Home Care Screening Complete  Medication Review (RN CM) Referral to Pharmacy

## 2024-07-11 NOTE — Plan of Care (Signed)
  Problem: Activity: Goal: Ability to tolerate increased activity will improve Outcome: Progressing Goal: Will verbalize the importance of balancing activity with adequate rest periods Outcome: Progressing   Problem: Respiratory: Goal: Ability to maintain a clear airway will improve Outcome: Progressing Goal: Levels of oxygenation will improve Outcome: Progressing Goal: Ability to maintain adequate ventilation will improve Outcome: Progressing   Problem: Activity: Goal: Ability to tolerate increased activity will improve Outcome: Progressing   Problem: Clinical Measurements: Goal: Ability to maintain a body temperature in the normal range will improve Outcome: Progressing   Problem: Respiratory: Goal: Ability to maintain adequate ventilation will improve Outcome: Progressing Goal: Ability to maintain a clear airway will improve Outcome: Progressing   Problem: Education: Goal: Knowledge of General Education information will improve Description: Including pain rating scale, medication(s)/side effects and non-pharmacologic comfort measures Outcome: Progressing   Problem: Health Behavior/Discharge Planning: Goal: Ability to manage health-related needs will improve Outcome: Progressing   Problem: Clinical Measurements: Goal: Ability to maintain clinical measurements within normal limits will improve Outcome: Progressing Goal: Will remain free from infection Outcome: Progressing Goal: Diagnostic test results will improve Outcome: Progressing Goal: Respiratory complications will improve Outcome: Progressing Goal: Cardiovascular complication will be avoided Outcome: Progressing   Problem: Activity: Goal: Risk for activity intolerance will decrease Outcome: Progressing   Problem: Nutrition: Goal: Adequate nutrition will be maintained Outcome: Progressing   Problem: Coping: Goal: Level of anxiety will decrease Outcome: Progressing   Problem: Elimination: Goal: Will  not experience complications related to bowel motility Outcome: Progressing Goal: Will not experience complications related to urinary retention Outcome: Progressing   Problem: Pain Managment: Goal: General experience of comfort will improve and/or be controlled Outcome: Progressing   Problem: Safety: Goal: Ability to remain free from injury will improve Outcome: Progressing   Problem: Skin Integrity: Goal: Risk for impaired skin integrity will decrease Outcome: Progressing

## 2024-07-11 NOTE — Discharge Summary (Signed)
 Physician Discharge Summary  SELBY FOISY FMW:[id number] DOB: 12/07/1955 DOA: 07/09/2024  PCP: [name], NP  Admit date: 07/09/2024 Discharge date: 07/11/2024  Time spent: 40 minutes  Recommendations for Outpatient Follow-up:  New medications prednisone 10 ,albuterol  with spacer and also doxycycline  to complete a short course for COPD exacerbation Recommend discussion with dermatology regarding Stelara use and keep appointment with Dr. Jeannetta of rheumatology in the outpatient setting In the setting of balanitis probably would discontinue Jardiance  but defer to PCP  Discharge Diagnoses:  MAIN problem for hospitalization   Parainfluenza virus in the setting of COPD causing acute respiratory failure  Please see below for itemized issues addressed in HOpsital- refer to other progress notes for clarity if needed  Discharge Condition: Improved  Diet recommendation: Heart healthy  Filed Weights   07/09/24 0201 07/09/24 0939  Weight: 80.7 kg 80 kg    History of present illness:  68 year old male Former smoker COPD baseline 4 L Psoriasis  on Stelara for the past year Paroxysmal SVT EF 50-55% 2024 with HFpEF Previous L3-4 decompression and fusion by Dr. Victory Boers 05/2023 Admission 7/14-7/27 2025 for lobectomy VATS under Dr. 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   Brought to emergency room 12/2 with fever rhinorrhea cough congestion severe SOB received DuoNeb 200 cc fluid Tylenol  for Tmax 101 Admitted overall for concerns of bacterial pneumonia in the setting of acute hypoxic respiratory failure and eventually found to have positive parainfluenza virus 1   Sodium 136 potassium 4.5 BUN/creatinine 18/1.0 alk phos 50 be AST/ALT 30/15 BNP 222 WBC 6.7 hemoglobin 13.1 platelet 189    Pertinent imaging/studies till date  Portable CXR similar chronic scarring left lower lung superimposed infection difficult to confirm 12/2 CT  indistinct consolidation posteriorly in the right lower lobe suspicious for pneumonia, confluent interstitial mild airspace opacity posteriorly in the left upper lobe?  Aspiration pneumonitis pneumonia with airway plugging enlarged anterior paratracheal subcarinal right hilar lymph nodes likely reactive-severe emphysema    Assessment  & Plan :      Acute respiratory failure secondary to aspiration pneumonia versus pneumonia superimposed on underlying lung malignancy Immunocompromised patient secondary to malignancy/polyarthritis Strep pneumo negative Legionella pending, BCx 2 pending Parainfluenza virus 1 positive on RVP Initially placed on azithromycin /ceftriaxone IV rapidly narrowed to doxycycline  as improving significantly without significant wheeze sputum Given elevated PSI score steroids added 40 mg prednisone which was rapidly tapered given no wheeze to 10 mg for 5 days Wean oxygen  to baseline 4 L as able and he has a concentrator at home uses oxygen  and should follow-up with his pulmonologist to have CCed on this discharge summary to ensure that he can complete pulmonary rehab   HFpEF EF 50-55% on echo NYHA class III-IV stage C NICM-baseline weight 167 pounds Previous cardiomyopathy on cardiac MRI EF 43% Paroxysmal SVT during surgery 2024 Fluids were held Lasix  was held and I resume the Lasix  at home dose subsequently at discharge Jardiance  10 has previously caused some balanitis and groin rash-would defer to PCP to continue Continue Toprol -XL 25 Continues on apixaban  5 twice daily   Stage IIb lung malignancy non-small cell followed by Dr. Gatha, Dr. Acey Dgyalli No need for adjuvant chemotherapy due to early stage complete resection Outpatient follow-up in 6 months reeval sometime in January and will CC his pulmonologist   Positive ANA?  Polyarthritis  Psoriasis is the reason why he is on Stelara--- he does not remember who his dermatologist is Await further workup  with Dr.  Jeannetta which is scheduled for next year patient aware to d/w Derm who Rx  Stelara and will need care coordination   Chronic pain with his scoliosis Continue Oxy IR 10 every 6 as needed severe pain-ibuprofen  can be resumed    Discharge Exam: Vitals:   07/11/24 0507 07/11/24 0801  BP: 125/67 107/60  Pulse: (!) 55 60  Resp: 18   Temp: 97.8 F (36.6 C) 98 F (36.7 C)  SpO2: 93% 94%    Subj on day of d/c   Looks well feels fair asking about inhalers Overall walked in the hallway with therapy No new issues  General Exam on discharge  EOMI NCAT no focal deficit no icterus no pallor Decreased air entry posterolaterally on the left side ROM intact No JVD No lower extremity edema  Discharge Instructions   Discharge Instructions     Diet - low sodium heart healthy   Complete by: As directed    Discharge instructions   Complete by: As directed    You were diagnosed with an influenza-like illness which is a viral illness and typically does not need antibiotics as antibiotics to treat bacteria You will need follow-up with pulmonary rehab I will CC your pulmonary doctor to make sure that he is aware that you need this follow-up and to resume treatment for it I would encourage whoever prescribes your Stelara to discuss other options for your psoriasis as this can blunt your immunity to regular bugs-I would also encourage you to wear a mask during the cold and flu season Look at your medication carefully I have added a rescue inhaler 4 times that you feel short winded or wheezy called albuterol  which should be used with a spacer and this can be demonstrated to you before you leave once you get the medication Complete the doxycycline  for respiratory toilet in the setting of COPD and please continue prednisone for several more days and stop as per instructions I have not changed any of your other medications please continue all of them as written   Increase activity slowly   Complete by:  As directed       Allergies as of 07/11/2024   No Known Allergies      Medication List     TAKE these medications    acetaminophen  500 MG tablet Commonly known as: TYLENOL  Take 500 mg by mouth every 6 (six) hours as needed for moderate pain (pain score 4-6).   albuterol  108 (90 Base) MCG/ACT inhaler Commonly known as: VENTOLIN  HFA Inhale 2 puffs into the lungs every 6 (six) hours as needed for wheezing or shortness of breath.   apixaban  5 MG Tabs tablet Commonly known as: ELIQUIS  Take 1 tablet (5 mg total) by mouth 2 (two) times daily.   aspirin  EC 81 MG tablet Take 1 tablet (81 mg total) by mouth daily. Swallow whole.   calcium  carbonate 750 MG chewable tablet Commonly known as: TUMS EX Chew 1-2 tablets by mouth 2 (two) times daily as needed for heartburn.   Clotrimazole  1 % Oint Apply to affected area twice daily as needed   doxycycline  100 MG tablet Commonly known as: VIBRA -TABS Take 1 tablet (100 mg total) by mouth every 12 (twelve) hours.   empagliflozin  10 MG Tabs tablet Commonly known as: Jardiance  Take 1 tablet (10 mg total) by mouth daily before breakfast.   fluticasone -salmeterol 100-50 MCG/ACT Aepb Commonly known as: Wixela Inhub Inhale 1 puff into the lungs 2 (two) times daily.  furosemide  20 MG tablet Commonly known as: LASIX  Take 1 tablet (20 mg total) by mouth as directed. Take 20 mg daily and an additional 20 mg every other day What changed:  how much to take when to take this additional instructions   ibuprofen  200 MG tablet Commonly known as: ADVIL  Take 200 mg by mouth every 6 (six) hours as needed for moderate pain (pain score 4-6).   loratadine  10 MG tablet Commonly known as: CLARITIN  Take 10 mg by mouth daily.   metoprolol  succinate 25 MG 24 hr tablet Commonly known as: Toprol  XL Take 1 tablet (25 mg total) by mouth daily.   Multivitamin Gummies Adult Chew Chew 8 each by mouth in the morning. Chew eight gummies by mouth daily  in the morning.   Oxycodone  HCl 10 MG Tabs Take 1 tablet by mouth every 6 (six) hours as needed.   OXYGEN  Inhale 4 L/min into the lungs continuous.   predniSONE 10 MG tablet Commonly known as: DELTASONE Take 1 tablet (10 mg total) by mouth daily with breakfast. Start taking on: July 12, 2024   Spacer/Aero-Holding Raguel French 1 each by Does not apply route 4 (four) times daily as needed.   Stelara 45 MG/0.5ML injection Generic drug: ustekinumab Inject 45 mg into the skin as directed. Every 12 weeks   Timolol  Maleate (Once-Daily) 0.5 % Soln Place 1 drop into both eyes in the morning and at bedtime.   Travoprost (BAK Free) 0.004 % Soln ophthalmic solution Commonly known as: TRAVATAN Place 1 drop into both eyes in the morning.       No Known Allergies    The results of significant diagnostics from this hospitalization (including imaging, microbiology, ancillary and laboratory) are listed below for reference.    Significant Diagnostic Studies: CT CHEST WO CONTRAST Result Date: 07/09/2024 EXAM: CT CHEST WITHOUT CONTRAST 07/09/2024 10:52:21 AM TECHNIQUE: CT of the chest was performed without the administration of intravenous contrast. Multiplanar reformatted images are provided for review. Automated exposure control, iterative reconstruction, and/or weight based adjustment of the mA/kV was utilized to reduce the radiation dose to as low as reasonably achievable. COMPARISON: CT Chest dated 01/08/2024 and chest radiograph dated 07/09/2024. 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 * Tracking Code: BO * FINDINGS: MEDIASTINUM: Heart and pericardium are unremarkable. The central airways are clear. Thoracic aortic, coronary artery, and branch vessel atheromatous vascular calcifications. LYMPH NODES: Anterior paratracheal node 1.1 cm in short axis on image 52 series 3, formerly 0.5 cm. Subcarinal  lymph node 1.2 cm in short axis on image 76 series 3, formerly 0.6 cm. Right hilar lymph node 1.0 cm in short axis on image 78 series 3, formerly 0.6 cm. No axillary lymphadenopathy. LUNGS AND PLEURA: Left lower lobectomy with resulting loss of volume in the left hemithorax. Severe emphysema. Peripheral calcified scarring in the lung apices favoring old granulomatous disease. Airway thickening suggest bronchitis or reactive airways disease. Indistinct consolidation posteriorly in the right lower lobe suspicious for pneumonia. Confluent interstitial and mild airspace opacity posteriorly in the left upper lobe potentially from aspiration or pneumonia. Airway plugging leading into this region. No pleural effusion or pneumothorax. SOFT TISSUES/BONES: Lower thoracic spondylosis. No acute abnormality of the bones or soft tissues. UPPER ABDOMEN: Limited images of the upper abdomen demonstrates abdominal aortic atherosclerosis. IMPRESSION: 1. Indistinct consolidation posteriorly in the right lower lobe suspicious for pneumonia. 2. Confluent interstitial and mild airspace opacity posteriorly  in the left upper lobe potentially from aspiration pneumonitis pneumonia , with airway plugging. 3. Enlarged anterior paratracheal, subcarinal, and right hilar lymph nodes with interval increase in size, likely reactive. 4. Status post left lower lobectomy with associated left hemithorax volume loss. 5. Severe emphysema.  Coronary and aortic atherosclerosis. 6. Lower thoracic spondylosis. Electronically signed by: Ryan Salvage MD 07/09/2024 03:28 PM EST RP Workstation: [id number]   DG Chest Portable 1 View Result Date: 07/09/2024 EXAM: 1 VIEW(S) XRAY OF THE CHEST 07/09/2024 02:27:00 AM COMPARISON: 06/04/2024 CLINICAL HISTORY: SOB FINDINGS: LUNGS AND PLEURA: Chronic volume loss of left lung. Similar-appearing chronic airspace opacities throughout left lung, most prominent in left mid to lower lung. Blunting of left costophrenic  angle and pleural thickening. No pleural effusion. No pneumothorax. HEART AND MEDIASTINUM: Atherosclerotic plaque. No acute abnormality of the cardiac and mediastinal silhouettes. BONES AND SOFT TISSUES: No acute osseous abnormality. IMPRESSION: 1. Similar chronic scarring in the left lower lung. Superimposed infection is difficult to exclude. Electronically signed by: Norman Gatlin MD 07/09/2024 02:41 AM EST RP Workstation: [id number]    Microbiology: Recent Results (from the past 240 hours)  Resp panel by RT-PCR (RSV, Flu A&B, Covid) Anterior Nasal Swab     Status: None   Collection Time: 07/09/24  2:09 AM   Specimen: Anterior Nasal Swab  Result Value Ref Range Status   SARS Coronavirus 2 by RT PCR NEGATIVE NEGATIVE Final   Influenza A by PCR NEGATIVE NEGATIVE Final   Influenza B by PCR NEGATIVE NEGATIVE Final    Comment: (NOTE) The Xpert Xpress SARS-CoV-2/FLU/RSV plus assay is intended as an aid in the diagnosis of influenza from Nasopharyngeal swab specimens and should not be used as a sole basis for treatment. Nasal washings and aspirates are unacceptable for Xpert Xpress SARS-CoV-2/FLU/RSV testing.  Fact Sheet for Patients: bloggercourse.com  Fact Sheet for Healthcare Providers: seriousbroker.it  This test is not yet approved or cleared by the [place]  FDA and has been authorized for detection and/or diagnosis of SARS-CoV-2 by FDA under an Emergency Use Authorization (EUA). This EUA will remain in effect (meaning this test can be used) for the duration of the COVID-19 declaration under Section 564(b)(1) of the Act, 21 U.S.C. section 360bbb-3(b)(1), unless the authorization is terminated or revoked.     Resp Syncytial Virus by PCR NEGATIVE NEGATIVE Final    Comment: (NOTE) Fact Sheet for Patients: bloggercourse.com  Fact Sheet for Healthcare  Providers: seriousbroker.it  This test is not yet approved or cleared by the [place]  FDA and has been authorized for detection and/or diagnosis of SARS-CoV-2 by FDA under an Emergency Use Authorization (EUA). This EUA will remain in effect (meaning this test can be used) for the duration of the COVID-19 declaration under Section 564(b)(1) of the Act, 21 U.S.C. section 360bbb-3(b)(1), unless the authorization is terminated or revoked.  Performed at [facility], [address], [place], [place] [address]   Blood culture (routine x 2)     Status: None (Preliminary result)   Collection Time: 07/09/24  4:01 AM   Specimen: BLOOD RIGHT ARM  Result Value Ref Range Status   Specimen Description BLOOD RIGHT ARM  Final   Special Requests   Final    BOTTLES DRAWN AEROBIC AND ANAEROBIC Blood Culture adequate volume   Culture   Final    NO GROWTH 2 DAYS Performed at [facility], [address] [place], [place] [address]    Report Status PENDING  Incomplete  Blood  culture (routine x 2)     Status: None (Preliminary result)   Collection Time: 07/09/24  4:01 AM   Specimen: BLOOD LEFT ARM  Result Value Ref Range Status   Specimen Description BLOOD LEFT ARM  Final   Special Requests   Final    BOTTLES DRAWN AEROBIC AND ANAEROBIC Blood Culture results may not be optimal due to an inadequate volume of blood received in culture bottles   Culture   Final    NO GROWTH 2 DAYS Performed at [facility], [address], [place], [place] [address]    Report Status PENDING  Incomplete  Group A Strep by PCR     Status: None   Collection Time: 07/09/24  5:07 AM   Specimen: Throat; Sterile Swab  Result Value Ref Range Status   Group A Strep by PCR NOT DETECTED NOT DETECTED Final    Comment: Performed at [facility], [address] [place], [place] [address]  Respiratory (~20 pathogens) panel by PCR     Status: Abnormal   Collection Time:  07/09/24 10:05 AM   Specimen: Nasopharyngeal Swab; Respiratory  Result Value Ref Range Status   Adenovirus NOT DETECTED NOT DETECTED Final   Coronavirus 229E NOT DETECTED NOT DETECTED Final    Comment: (NOTE) The Coronavirus on the Respiratory Panel, DOES NOT test for the novel  Coronavirus (2019 nCoV)    Coronavirus HKU1 NOT DETECTED NOT DETECTED Final   Coronavirus NL63 NOT DETECTED NOT DETECTED Final   Coronavirus OC43 NOT DETECTED NOT DETECTED Final   Metapneumovirus NOT DETECTED NOT DETECTED Final   Rhinovirus / Enterovirus NOT DETECTED NOT DETECTED Final   Influenza A NOT DETECTED NOT DETECTED Final   Influenza B NOT DETECTED NOT DETECTED Final   Parainfluenza Virus 1 DETECTED (A) NOT DETECTED Final   Parainfluenza Virus 2 NOT DETECTED NOT DETECTED Final   Parainfluenza Virus 3 NOT DETECTED NOT DETECTED Final   Parainfluenza Virus 4 NOT DETECTED NOT DETECTED Final   Respiratory Syncytial Virus NOT DETECTED NOT DETECTED Final   Bordetella pertussis NOT DETECTED NOT DETECTED Final   Bordetella Parapertussis NOT DETECTED NOT DETECTED Final   Chlamydophila pneumoniae NOT DETECTED NOT DETECTED Final   Mycoplasma pneumoniae NOT DETECTED NOT DETECTED Final    Comment: Performed at [facility], [address], [place], [place] [address]     Labs: Basic Metabolic Panel: Recent Labs  Lab 07/09/24 0210 07/10/24 0537  NA 136 140  K 4.5 4.2  CL 100 100  CO2 26 28  GLUCOSE 131* 113*  BUN 18 9  CREATININE 1.00 0.98  CALCIUM  8.3* 8.9   Liver Function Tests: Recent Labs  Lab 07/09/24 0210 07/10/24 0537  AST 30 19  ALT 15 13  ALKPHOS 50 43  BILITOT 0.7 0.4  PROT 6.1* 6.1*  ALBUMIN 3.0* 2.7*   Recent Labs  Lab 07/09/24 0210  LIPASE 27   No results for input(s): AMMONIA in the last 168 hours. CBC: Recent Labs  Lab 07/09/24 0210 07/10/24 0537  WBC 6.7 11.6*  NEUTROABS 4.7  --   HGB 13.1 12.4*  HCT 39.7 37.4*  MCV 98.8 96.9  PLT 189 157   Cardiac  Enzymes: No results for input(s): CKTOTAL, CKMB, CKMBINDEX, TROPONINI in the last 168 hours. BNP: BNP (last 3 results) Recent Labs    07/09/24 0210  BNP 222.9*    ProBNP (last 3 results) Recent Labs    04/12/24 1318  PROBNP 525*  CBG: No results for input(s): GLUCAP in the last 168 hours.  Signed:  Colen Grimes MD   [facility] 07/11/2024, 12:53 PM

## 2024-07-12 ENCOUNTER — Telehealth: Payer: Self-pay

## 2024-07-12 ENCOUNTER — Encounter: Payer: Self-pay | Admitting: Pharmacist

## 2024-07-12 ENCOUNTER — Telehealth (HOSPITAL_COMMUNITY): Payer: Self-pay

## 2024-07-12 LAB — LEGIONELLA PNEUMOPHILA SEROGP 1 UR AG: L. pneumophila Serogp 1 Ur Ag: NEGATIVE

## 2024-07-12 NOTE — Progress Notes (Signed)
 Pharmacy Quality Measure Review  This patient is appearing on a report for being at risk of failing the adherence measure for diabetes medications this calendar year. Patient actually does not have a diagnosis of diabetes. He is taking Jardiance  for CHF / cardiomyopathy.   Medication: Jardiance  Last fill date: 06/06/2024 for 30 day supply  Reviewed recent refill history in Dr Annemarie database. Actual last refill date was 07/01/2024 for 90 day supply. Patient has 1 refill remaining. Next appointment with PCP is 01/27/205 and cardiology 08/26/2024   Insurance report was not up to date. No addition outreach needed at this time.   Madelin Ray, PharmD Clinical Pharmacist [facility]

## 2024-07-12 NOTE — Telephone Encounter (Signed)
 Patient's daughter called to cancel patient's last 3 10:15 PR appts, states patient was just hospitalized and was told by his dr that he cannot attend PR until he sees a pulmonologist.

## 2024-07-12 NOTE — Patient Instructions (Signed)
 Visit Information  Thank you for taking time to visit with me today. Please don't hesitate to contact me if I can be of assistance to you before our next scheduled telephone appointment.  Our next appointment is by telephone on Friday December 12th at 11:30am  Following is a copy of your care plan:   Goals Addressed             This Visit's Progress    VBCI Transitions of Care (TOC) Care Plan       Problems:  Recent Hospitalization for treatment of COPD exacerbation due to Parainfluenza Hospital or ED Adm Risk 90%  Goal:  Over the next 30 days, the patient will not experience hospital readmission  Interventions:   COPD Interventions: Advised patient to engage in light exercise as tolerated 3-5 days a week to aid in the the management of COPD Advised patient to track and manage COPD triggers Discussed Pulmonary Rehab and offered to assist with referral placement Discussed the importance of adequate rest and management of fatigue with COPD Provided education about and advised patient to utilize infection prevention strategies to reduce risk of respiratory infection Provided instruction about proper use of medications used for management of COPD including inhalers Screening for signs and symptoms of depression related to chronic disease state  Use of home oxygen  Education on the use of a spacer for medication administration Oxygen  at 4l/min continuous Participate in Pulmonary Rehab as directed by the provider  Patient Self Care Activities:  Attend all scheduled provider appointments Call pharmacy for medication refills 3-7 days in advance of running out of medications Call provider office for new concerns or questions  Notify RN Care Manager of [facility] call rescheduling needs Participate in Transition of Care Program/Attend [facility] scheduled calls Perform all self care activities independently  Take medications as prescribed    Plan:  Telephone follow up appointment with care  management team member scheduled for:  Friday December 12th at 11:30am        Patient verbalizes understanding of instructions and care plan provided today and agrees to view in MyChart. Active MyChart status and patient understanding of how to access instructions and care plan via MyChart confirmed with patient.     The patient has been provided with contact information for the care management team and has been advised to call with any health related questions or concerns.   Please call the care guide team at [phone] if you need to cancel or reschedule your appointment.   Please call the Suicide and Crisis Lifeline: 988 call the [place]  National Suicide Prevention Lifeline: 609-204-8146 or TTY: (640) 488-2397 TTY 360-369-1921) to talk to a trained counselor if you are experiencing a Mental Health or Behavioral Health Crisis or need someone to talk to.  Medford Balboa, BSN, RN [place]  VBCI - [employer] [facility] RN Care Manager [phone]

## 2024-07-12 NOTE — Transitions of Care (Post Inpatient/ED Visit) (Signed)
 07/12/2024  Name: Brett [name] MRN: [id number] DOB: 12/19/1955  Today's TOC FU Call Status: Today's TOC FU Call Status:: Successful TOC FU Call Completed TOC FU Call Complete Date: 07/12/24  Patient's Name and Date of Birth confirmed. Name, DOB  Transition Care Management Follow-up Telephone Call Date of Discharge: 07/11/24 Discharge Facility: [facility]) Type of Discharge: Inpatient Admission Primary Inpatient Discharge Diagnosis:: Parainfluenza How have you been since you were released from the hospital?: Better Any questions or concerns?: Yes Patient Questions/Concerns:: Just with his difficulty with breathing Patient Questions/Concerns Addressed: Notified Provider of Patient Questions/Concerns  Items Reviewed: Did you receive and understand the discharge instructions provided?: Yes Medications obtained,verified, and reconciled?: Yes (Medications Reviewed) Any new allergies since your discharge?: No Dietary orders reviewed?: Yes Type of Diet Ordered:: Low Sodium Heart Healthy Do you have support at home?: Yes People in Home [RPT]: child(ren), adult Name of Support/Comfort Primary Source: [name]  Medications Reviewed Today: Medications Reviewed Today     Reviewed by [name], RN (Case Manager) on 07/12/24 at 1415  Med List Status: <None>   Medication Order Taking? Sig Documenting Provider Last Dose Status Informant  acetaminophen  (TYLENOL ) 500 MG tablet [id number]  Take 500 mg by mouth every 6 (six) hours as needed for moderate pain (pain score 4-6). [provider]  Active Self, Pharmacy Records           Med Note LEOBARDO, NICOLE   Tue Jul 09, 2024  7:38 AM) Emily on opposite days from ibuprofen .   albuterol  (VENTOLIN  HFA) 108 (90 Base) MCG/ACT inhaler [id number]  Inhale 2 puffs into the lungs every 6 (six) hours as needed for wheezing or shortness of breath. Samtani, Jai-Gurmukh, MD  Active   apixaban  (ELIQUIS ) 5 MG TABS tablet [id number]   Take 1 tablet (5 mg total) by mouth 2 (two) times daily. MadireddyAlean [name], MD  Active Self, Pharmacy Records  aspirin  EC 81 MG tablet [id number]  Take 1 tablet (81 mg total) by mouth daily. Swallow whole. [name], [name], MD  Active Self, Pharmacy Records  calcium  carbonate (TUMS EX) 750 MG chewable tablet [id number]  Chew 1-2 tablets by mouth 2 (two) times daily as needed for heartburn. [provider]  Active Self, Pharmacy Records  Clotrimazole  1 % OINT [id number]  Apply to affected area twice daily as needed [name], NP  Active Self, Pharmacy Records  doxycycline  (VIBRA -TABS) 100 MG tablet [id number] Yes Take 1 tablet (100 mg total) by mouth every 12 (twelve) hours.  Patient taking differently: Take 1 tablet (100 mg total) by mouth every 12 (twelve) hours.   Samtani, Jai-Gurmukh, MD  Active   empagliflozin  (JARDIANCE ) 10 MG TABS tablet [id number]  Take 1 tablet (10 mg total) by mouth daily before breakfast. [name], [name], MD  Active Self, Pharmacy Records  fluticasone -salmeterol [facility]) 100-50 MCG/ACT AEPB [id number]  Inhale 1 puff into the lungs 2 (two) times daily. Isadora Hose, MD  Active Self, Pharmacy Records  furosemide  (LASIX ) 20 MG tablet [id number]  Take 1 tablet (20 mg total) by mouth as directed. Take 20 mg daily and an additional 20 mg every other day  Patient taking differently: Take 20-40 mg by mouth in the morning. Take one tablet (20mg ) by mouth every other day.  On opposite days, take two tablets (40mg ) by mouth.   [name], [name], MD  Active Self, Pharmacy Records           Med Note (LEE, NICOLE  Tue Jul 09, 2024  7:37 AM) Two tablets taken on 07/08/24  ibuprofen  (ADVIL ) 200 MG tablet [id number]  Take 200 mg by mouth every 6 (six) hours as needed for moderate pain (pain score 4-6). [provider]  Active Self, Pharmacy Records           Med Note LEOBARDO, NICOLE   Tue Jul 09, 2024  7:38 AM) Emily on opposite days from  acetaminophen    loratadine  (CLARITIN ) 10 MG tablet [id number]  Take 10 mg by mouth daily. [provider]  Active Self, Pharmacy Records  metoprolol  succinate (TOPROL  XL) 25 MG 24 hr tablet [id number]  Take 1 tablet (25 mg total) by mouth daily. [name], [name], MD  Active Self, Pharmacy Records  Multiple Vitamins-Minerals (MULTIVITAMIN GUMMIES ADULT) CHEW [id number]  Chew 8 each by mouth in the morning. Chew eight gummies by mouth daily in the morning. [provider]  Active Self, Pharmacy Records  Oxycodone  HCl 10 MG TABS [id number]  Take 1 tablet by mouth every 6 (six) hours as needed. [provider]  Active Self, Pharmacy Records  OXYGEN  [id number]  Inhale 4 L/min into the lungs continuous. [provider]  Active Self, Pharmacy Records  predniSONE  (DELTASONE ) 10 MG tablet [id number]  Take 1 tablet (10 mg total) by mouth daily with breakfast. Samtani, Jai-Gurmukh, MD  Active   Spacer/Aero-Holding Raguel FRENCH [id number]  1 each by Does not apply route 4 (four) times daily as needed. Samtani, Jai-Gurmukh, MD  Active   STELARA 45 MG/0.5ML injection [id number]  Inject 45 mg into the skin as directed. Every 12 weeks [provider]  Active Self, Pharmacy Records           Med Note (GARNER, TIFFANY L   Mon Apr 29, 2024 10:05 AM)    Timolol  Maleate, Once-Daily, 0.5 % SOLN [id number]  Place 1 drop into both eyes in the morning and at bedtime. [provider]  Active Self, Pharmacy Records  Travoprost, BAK Free, (TRAVATAN) 0.004 % SOLN ophthalmic solution [id number]  Place 1 drop into both eyes in the morning. [provider]  Active Self, Pharmacy Records            Home Care and Equipment/Supplies: Were Home Health Services Ordered?: NA Any new equipment or medical supplies ordered?: NA  Functional Questionnaire: Do you need assistance with bathing/showering or dressing?: No Do you need assistance with meal preparation?: No Do you  need assistance with eating?: No Do you have difficulty maintaining continence: No Do you need assistance with getting out of bed/getting out of a chair/moving?: No Do you have difficulty managing or taking your medications?: No  Follow up appointments reviewed: PCP Follow-up appointment confirmed?: Yes Date of PCP follow-up appointment?: 07/17/24 Follow-up Provider: [facility] Follow-up appointment confirmed?: Yes Date of Specialist follow-up appointment?: 07/24/24 Follow-Up Specialty Provider:: Dr. Isadora Do you need transportation to your follow-up appointment?: No Do you understand care options if your condition(s) worsen?: Yes-patient verbalized understanding  SDOH Interventions Today    Flowsheet Row Most Recent Value  SDOH Interventions   Food Insecurity Interventions Intervention Not Indicated  Housing Interventions Intervention Not Indicated  Transportation Interventions Intervention Not Indicated  Utilities Interventions Intervention Not Indicated    Goals Addressed             This Visit's Progress    VBCI Transitions of Care (TOC) Care Plan       Problems:  Recent Hospitalization for treatment of COPD exacerbation due  to [facility] or ED Adm Risk 90%  Goal:  Over the next 30 days, the patient will not experience hospital readmission  Interventions:   COPD Interventions: Advised patient to engage in light exercise as tolerated 3-5 days a week to aid in the the management of COPD Advised patient to track and manage COPD triggers Discussed Pulmonary Rehab and offered to assist with referral placement Discussed the importance of adequate rest and management of fatigue with COPD Provided education about and advised patient to utilize infection prevention strategies to reduce risk of respiratory infection Provided instruction about proper use of medications used for management of COPD including inhalers Screening for signs and  symptoms of depression related to chronic disease state  Use of home oxygen  Education on the use of a spacer for medication administration Oxygen  at 4l/min continuous Participate in Pulmonary Rehab as directed by the provider  Patient Self Care Activities:  Attend all scheduled provider appointments Call pharmacy for medication refills 3-7 days in advance of running out of medications Call provider office for new concerns or questions  Notify RN Care Manager of [facility] call rescheduling needs Participate in Transition of Care Program/Attend [facility] scheduled calls Perform all self care activities independently  Take medications as prescribed    Plan:  Telephone follow up appointment with care management team member scheduled for:  Friday December 12th at 11:30am         Medford Balboa, BSN, RN [place]  VBCI - [facility] RN Care Manager [phone]

## 2024-07-14 LAB — CULTURE, BLOOD (ROUTINE X 2)
Culture: NO GROWTH
Culture: NO GROWTH
Special Requests: ADEQUATE

## 2024-07-15 ENCOUNTER — Other Ambulatory Visit: Payer: Self-pay | Admitting: Thoracic Surgery (Cardiothoracic Vascular Surgery)

## 2024-07-15 DIAGNOSIS — C3432 Malignant neoplasm of lower lobe, left bronchus or lung: Secondary | ICD-10-CM

## 2024-07-15 NOTE — Progress Notes (Signed)
 Discharge Progress Report  Patient Details  Name: Brett [name] MRN: [id number] Date of Birth: March 28, 1956 Referring Provider:   [name] Walk Test from 04/26/2024 in [facility]  Referring Provider [name]     Number of Visits: 17  Reason for Discharge:  Early Exit:  Hopsitalization  Smoking History:  Social History   Tobacco Use  Smoking Status Former   Current packs/day: 0.00   Average packs/day: 0.5 packs/day for 50.3 years (25.2 ttl pk-yrs)   Types: Cigarettes   Start date: 12   Quit date: 12/11/2023   Years since quitting: 0.5  Smokeless Tobacco Never  Tobacco Comments   Started smoking at 68 years old.   Smoked 1 PPD at his heaviest.   Quit smoking on 12/11/2023    Diagnosis:  Malignant neoplasm of lower lobe bronchus, left (HCC)  Chronic combined systolic and diastolic heart failure (HCC)  ADL [facility]:  Pulmonary Assessment Scores     Row Name 04/26/24 1052         ADL [facility]   ADL Phase Entry     SOB Score total 88       CAT Score   CAT Score 32       mMRC Score   mMRC Score 3        Initial Exercise Prescription:  Initial Exercise Prescription - 04/26/24 1200       Date of Initial Exercise RX and Referring Provider   Date 04/26/24    Referring Provider [name]    Expected Discharge Date 07/23/24      Oxygen    Oxygen  Continuous    Liters 6    Maintain Oxygen  Saturation 88% or higher      NuStep   Level 1    SPM 54    Minutes 30    METs 1.4      Prescription Details   Frequency (times per week) 2    Duration Progress to 30 minutes of continuous aerobic without signs/symptoms of physical distress      Intensity   THRR 40-80% of Max Heartrate 60-121    Ratings of Perceived Exertion 11-13    Perceived Dyspnea 0-4      Progression   Progression Continue to progress workloads to maintain intensity without signs/symptoms of physical distress.       Resistance Training   Training Prescription Yes    Weight red bands    Reps 10-15          Discharge Exercise Prescription (Final Exercise Prescription Changes):  Exercise Prescription Changes - 07/02/24 1200       Response to Exercise   Blood Pressure (Admit) 111/57    Blood Pressure (Exercise) 122/68    Blood Pressure (Exit) 108/60    Heart Rate (Admit) 58 bpm    Heart Rate (Exercise) 71 bpm    Heart Rate (Exit) 66 bpm    Oxygen  Saturation (Admit) 98 %    Oxygen  Saturation (Exercise) 96 %    Oxygen  Saturation (Exit) 100 %    Rating of Perceived Exertion (Exercise) 13    Perceived Dyspnea (Exercise) 2    Duration Continue with 30 min of aerobic exercise without signs/symptoms of physical distress.    Intensity THRR unchanged      Progression   Progression Continue to progress workloads to maintain intensity without signs/symptoms of physical distress.      Resistance Training   Training Prescription Yes  Weight red bands    Reps 10-15    Time 10 Minutes      Oxygen    Oxygen  Continuous    Liters 4      NuStep   Level 3    Minutes 30    METs 1.8      Oxygen    Maintain Oxygen  Saturation 88% or higher          Functional Capacity:  6 Minute Walk     Row Name 04/26/24 1211         6 Minute Walk   Phase Initial     Distance 661 feet     Walk Time 6 minutes     # of Rest Breaks 1  3:55-4:12     MPH 1.25     METS 2.07     RPE 12     Perceived Dyspnea  1     VO2 Peak 7.24     Symptoms Yes (comment)     Comments hip and back pain     Resting HR 74 bpm     Resting BP 118/60     Resting Oxygen  Saturation  90 %     Exercise Oxygen  Saturation  during 6 min walk 86 %     Max Ex. HR 76 bpm     Max Ex. BP 122/64     2 Minute Post BP 112/60       Interval HR   1 Minute HR 72     2 Minute HR 71     3 Minute HR 75     4 Minute HR 71     5 Minute HR 76     6 Minute HR 76     2 Minute Post HR 60     Interval Heart Rate? Yes       Interval Oxygen     Interval Oxygen ? Yes     Baseline Oxygen  Saturation % 90 %     1 Minute Oxygen  Saturation % 90 %     1 Minute Liters of Oxygen  4 L     2 Minute Oxygen  Saturation % 86 %  O2 increased to 6L     2 Minute Liters of Oxygen  4 L  increased to 6L     3 Minute Oxygen  Saturation % 90 %     3 Minute Liters of Oxygen  6 L     4 Minute Oxygen  Saturation % 91 %     4 Minute Liters of Oxygen  6 L     5 Minute Oxygen  Saturation % 90 %     5 Minute Liters of Oxygen  6 L     6 Minute Oxygen  Saturation % 94 %     6 Minute Liters of Oxygen  6 L     2 Minute Post Oxygen  Saturation % 97 %     2 Minute Post Liters of Oxygen  6 L        Psychological, QOL, Others - Outcomes: PHQ 2/9:    04/26/2024   10:47 AM 01/16/2024    2:15 PM 02/16/2023   10:34 AM 11/01/2021    8:42 AM 04/26/2021    7:06 AM  Depression screen PHQ 2/9  Decreased Interest 0 0 1 0 0  Down, Depressed, Hopeless 0 0 0 0 0  PHQ - 2 Score 0 0 1 0 0  Altered sleeping 0      Tired, decreased energy 0      Change in appetite 0  Feeling bad or failure about yourself  0      Trouble concentrating 0      Moving slowly or fidgety/restless 0      Suicidal thoughts 0      PHQ-9 Score 0       Difficult doing work/chores Not difficult at all         Data saved with a previous flowsheet row definition    Quality of Life:   Personal Goals: Goals established at orientation with interventions provided to work toward goal.  Personal Goals and Risk Factors at Admission - 04/26/24 1102       Core Components/Risk Factors/Patient Goals on Admission   Tobacco Cessation Yes    Number of packs per day vapes every now and then    Intervention Assist the participant in steps to quit. Provide individualized education and counseling about committing to Tobacco Cessation, relapse prevention, and pharmacological support that can be provided by physician.;Education officer, environmental, assist with locating and accessing local/national Quit Smoking  programs, and support quit date choice.    Expected Outcomes Short Term: Will demonstrate readiness to quit, by selecting a quit date.;Short Term: Will quit all tobacco product use, adhering to prevention of relapse plan.;Long Term: Complete abstinence from all tobacco products for at least 12 months from quit date.    Improve shortness of breath with ADL's Yes    Intervention Provide education, individualized exercise plan and daily activity instruction to help decrease symptoms of SOB with activities of daily living.    Expected Outcomes Short Term: Improve cardiorespiratory fitness to achieve a reduction of symptoms when performing ADLs;Long Term: Be able to perform more ADLs without symptoms or delay the onset of symptoms           Personal Goals Discharge:  Goals and Risk Factor Review     Row Name 04/29/24 1440 05/29/24 1032 06/24/24 1533 07/15/24 0905       Core Components/Risk Factors/Patient Goals Review   Personal Goals Review Tobacco Cessation;Improve shortness of breath with ADL's;Develop more efficient breathing techniques such as purse lipped breathing and diaphragmatic breathing and practicing self-pacing with activity. Tobacco Cessation;Improve shortness of breath with ADL's;Develop more efficient breathing techniques such as purse lipped breathing and diaphragmatic breathing and practicing self-pacing with activity. Tobacco Cessation;Improve shortness of breath with ADL's;Develop more efficient breathing techniques such as purse lipped breathing and diaphragmatic breathing and practicing self-pacing with activity. Improve shortness of breath with ADL's;Tobacco Cessation    Review Jerrel is scheduled to start PR on 05/02/24. Unable to assess his goals at this time. Monthly review of patient's Core Components/Risk Factors/Patient Goals are as follows: Goal progressing for tobacco cessation. Octaviano has not yet quit vaping. Staff will provide Octaviano with resources on ways to quit. Goal  progressing for improving shortness of breath with ADL's. Octaviano is currently using 4L O2 while exercising to keep sats >88%. He is exercising on the Nustep for 30 minutes and has been able to increase his workload and MET's. Goal progressing for developing more efficient breathing techniques such as purse lipped breathing and diaphragmatic breathing; and practicing self-pacing with activity.  We will continue to monitor Bob's progress throughout the program. Monthly review of patient's Core Components/Risk Factors/Patient Goals are as follows: Goal progressing for tobacco cessation. Octaviano has not yet quit vaping, but has cut back significantly. Goal progressing for improving shortness of breath with ADL's. Octaviano is currently using 4L O2 while exercising to keep sats >88%. He is exercising on  the Nustep for 30 minutes and has been able to increase his workload and MET's. Goal met for developing more efficient breathing techniques such as purse lipped breathing and diaphragmatic breathing; and practicing self-pacing with activity. Octaviano is able use purse lip breathing when he becomes short of breath. He also knows how to self pace based on his RPE/dyspnea scores.  We will continue to monitor Bob's progress throughout the program. Octaviano was discharged from the PR program due to hospitalization. He met his goal goal for improving shortness of breath with ADL's. He was able to increase his endurance and stamina during the program. Octaviano did not meet his goal for tobacco cessation. He was able to cut back on his vaping, but not able to quit completely.    Expected Outcomes To improve shortness of breath with ADL's, develop more efficient breathing techniques such as purse lipped breathing and diaphragmatic breathing; and practicing self-pacing with activity and stop vaping. To improve shortness of breath with ADL's, develop more efficient breathing techniques such as purse lipped breathing and diaphragmatic breathing; and practicing  self-pacing with activity and stop vaping. To improve shortness of breath with ADL's and stop vaping. To continue to exercise and modify his nutrition and lifestyle post discharge       Exercise Goals and Review:  Exercise Goals     Row Name 04/26/24 1048             Exercise Goals   Increase Physical Activity Yes       Intervention Provide advice, education, support and counseling about physical activity/exercise needs.;Develop an individualized exercise prescription for aerobic and resistive training based on initial evaluation findings, risk stratification, comorbidities and participant's personal goals.       Expected Outcomes Short Term: Attend rehab on a regular basis to increase amount of physical activity.;Long Term: Add in home exercise to make exercise part of routine and to increase amount of physical activity.;Long Term: Exercising regularly at least 3-5 days a week.       Increase Strength and Stamina Yes       Intervention Provide advice, education, support and counseling about physical activity/exercise needs.;Develop an individualized exercise prescription for aerobic and resistive training based on initial evaluation findings, risk stratification, comorbidities and participant's personal goals.       Expected Outcomes Short Term: Increase workloads from initial exercise prescription for resistance, speed, and METs.;Short Term: Perform resistance training exercises routinely during rehab and add in resistance training at home;Long Term: Improve cardiorespiratory fitness, muscular endurance and strength as measured by increased METs and functional capacity ( )       Able to understand and use rate of perceived exertion (RPE) scale Yes       Intervention Provide education and explanation on how to use RPE scale       Expected Outcomes Short Term: Able to use RPE daily in rehab to express subjective intensity level;Long Term:  Able to use RPE to guide intensity level when  exercising independently       Able to understand and use Dyspnea scale Yes       Intervention Provide education and explanation on how to use Dyspnea scale       Expected Outcomes Short Term: Able to use Dyspnea scale daily in rehab to express subjective sense of shortness of breath during exertion;Long Term: Able to use Dyspnea scale to guide intensity level when exercising independently       Knowledge and understanding of Target Heart Rate  Range (THRR) Yes       Intervention Provide education and explanation of THRR including how the numbers were predicted and where they are located for reference       Expected Outcomes Short Term: Able to state/look up THRR;Long Term: Able to use THRR to govern intensity when exercising independently;Short Term: Able to use daily as guideline for intensity in rehab       Understanding of Exercise Prescription Yes       Intervention Provide education, explanation, and written materials on patient's individual exercise prescription       Expected Outcomes Short Term: Able to explain program exercise prescription;Long Term: Able to explain home exercise prescription to exercise independently          Exercise Goals Re-Evaluation:  Exercise Goals Re-Evaluation     Row Name 04/29/24 0950 05/24/24 1009 06/28/24 1050 07/12/24 1325       Exercise Goal Re-Evaluation   Exercise Goals Review Increase Physical Activity;Understanding of Exercise Prescription;Able to understand and use Dyspnea scale;Increase Strength and Stamina;Knowledge and understanding of Target Heart Rate Range (THRR);Able to understand and use rate of perceived exertion (RPE) scale Increase Physical Activity;Understanding of Exercise Prescription;Able to understand and use Dyspnea scale;Increase Strength and Stamina;Knowledge and understanding of Target Heart Rate Range (THRR);Able to understand and use rate of perceived exertion (RPE) scale Increase Physical Activity;Understanding of Exercise  Prescription;Able to understand and use Dyspnea scale;Increase Strength and Stamina;Knowledge and understanding of Target Heart Rate Range (THRR);Able to understand and use rate of perceived exertion (RPE) scale Increase Physical Activity;Understanding of Exercise Prescription;Able to understand and use Dyspnea scale;Increase Strength and Stamina;Knowledge and understanding of Target Heart Rate Range (THRR);Able to understand and use rate of perceived exertion (RPE) scale    Comments Theoden is scheduled to start exercise on 9/25. Will continue to monitor and progress as able. Octaviano has completed 6 exercise sessions. He exercises for 30 min on the Nustep. He averages 1.8 METs at level 2 on the Nustep. He performs the warmup and cooldown standing/ seated dependent on his shortness of breath/ fatigue. Octaviano has increased his level on the Nustep as METs have slightly increased. He is very deconditioned. Will continue to monitor and progress as able. Octaviano has completed 16 exercise sessions. He exercises for 30 min on the Nustep. He averages 2 METs at level 3 on the Nustep. He performs the warmup and cooldown standing/ seated dependent on his shortness of breath/ fatigue. Octaviano continues to increase his level on the Nustep. His METs have slightly increased because of this change. He remains motivated to exercise. Will continue to monitor and progress as able. Octaviano has completed 17 exercise sessions. His peak METs were 2 on the Nustep. He is being discharged due to recent hospialization.    Expected Outcomes Through exercise at rehab and home, the patient will decrease shortness of breath with daily activities and feel confident in carrying out an exercise regimen at home. Through exercise at rehab and home, the patient will decrease shortness of breath with daily activities and feel confident in carrying out an exercise regimen at home. Through exercise at rehab and home, the patient will decrease shortness of breath with daily  activities and feel confident in carrying out an exercise regimen at home. Through exercise at rehab and home, the patient will decrease shortness of breath with daily activities and feel confident in carrying out an exercise regimen at home.       Nutrition & Weight - Outcomes:  Nutrition:  Nutrition Therapy & Goals - 06/13/24 1133       Nutrition Therapy   Diet Regular diet      Personal Nutrition Goals   Nutrition Goal Patient to identify strategies for weight gain of 0.5-2# per week.    Comments Patient with medical history significant for COPD, lung cancer s/p robotic assisted left lower lobectomy on 02/19/2024. Low BMI based on ideal BMI of 23-27 kg/m2 for >= 65 years. Pt reports working towards healthy wt gain by incorporating rich sources of protein such as salmon. Also consuming high protein oral nutrition supplement once daily. RD provided suggestions to further increase caloric intake.      Intervention Plan   Intervention Prescribe, educate and counsel regarding individualized specific dietary modifications aiming towards targeted core components such as weight, hypertension, lipid management, diabetes, heart failure and other comorbidities.;Nutrition handout(s) given to patient.   Weight Gain handout provided   Expected Outcomes Short Term Goal: Understand basic principles of dietary content, such as calories, fat, sodium, cholesterol and nutrients.;Long Term Goal: Adherence to prescribed nutrition plan.          Nutrition Discharge:   Education Questionnaire Score:  Knowledge Questionnaire Score - 04/26/24 1050       Knowledge Questionnaire Score   Pre Score 15/18          Goals reviewed with patient; copy given to patient.

## 2024-07-16 ENCOUNTER — Encounter (HOSPITAL_COMMUNITY)

## 2024-07-16 ENCOUNTER — Ambulatory Visit: Admitting: Thoracic Surgery (Cardiothoracic Vascular Surgery)

## 2024-07-16 ENCOUNTER — Encounter: Payer: Self-pay | Admitting: Thoracic Surgery (Cardiothoracic Vascular Surgery)

## 2024-07-16 ENCOUNTER — Inpatient Hospital Stay (HOSPITAL_COMMUNITY): Admission: RE | Admit: 2024-07-16 | Discharge: 2024-07-16 | Attending: Cardiology | Admitting: Cardiology

## 2024-07-16 ENCOUNTER — Other Ambulatory Visit (HOSPITAL_COMMUNITY)

## 2024-07-16 VITALS — BP 123/66 | HR 71 | Resp 18 | Ht 74.0 in | Wt 177.0 lb

## 2024-07-16 DIAGNOSIS — J929 Pleural plaque without asbestos: Secondary | ICD-10-CM | POA: Diagnosis not present

## 2024-07-16 DIAGNOSIS — Z09 Encounter for follow-up examination after completed treatment for conditions other than malignant neoplasm: Secondary | ICD-10-CM

## 2024-07-16 DIAGNOSIS — C3432 Malignant neoplasm of lower lobe, left bronchus or lung: Secondary | ICD-10-CM

## 2024-07-16 DIAGNOSIS — C3490 Malignant neoplasm of unspecified part of unspecified bronchus or lung: Secondary | ICD-10-CM | POA: Diagnosis not present

## 2024-07-16 DIAGNOSIS — R9389 Abnormal findings on diagnostic imaging of other specified body structures: Secondary | ICD-10-CM | POA: Diagnosis not present

## 2024-07-16 NOTE — Progress Notes (Signed)
 [address] [name] [id number]             [phone]    HPI: [name] returns for a scheduled follow-up visit  Brett [name] is a 68 year old man with a history of tobacco use, COPD, lung cancer, left lower lobectomy, paroxysmal SVT, nonsustained VT, scoliosis, multiple hip and back surgeries, chronic narcotic dependent pain, rosacea, and glaucoma.  He underwent robotic assisted left lower lobectomy in July 2025.  Complicated by an air leak, but that resolved.  He was discharged home on oxygen .  He was hospitalized last week with parainfluenza.  Was discharged on 07/11/2024.  He had a CT while he was in the hospital which showed consolidation consistent with pneumonia.  There was some mild adenopathy consistent with reactive lymph nodes.  Continues to be on 4 L nasal cannula oxygen .  Complains of pain centrally in his chest which radiates around to both sides.  Relieved with resting for 10 to 15 minutes.  Past Medical History:  Diagnosis Date   Allergy    Arthritis    Asthma    Atrial fibrillation (HCC) 04/29/2024   Bilateral low back pain without sciatica 09/22/2015   Blood transfusion without reported diagnosis    Cancer (HCC)    Left Lower Lobe Adenocarcinoma   Cardiomyopathy (HCC)    Cervical neck pain with evidence of disc disease 09/22/2015   Chicken pox    Chronic CHF (congestive heart failure) (HCC) 03/26/2024   Congenital hip deformity    Corn of foot 08/09/2022   Degenerative disc disease, cervical 01/29/2015   Dysrhythmia    SVT   Eczema 05/30/2013   Erectile dysfunction 09/22/2015   Glaucoma 05/30/2013   History of cardiac dysrhythmia 05/23/2023   Isthmic spondylolisthesis 05/05/2020   Lumbar adjacent segment disease with spondylolisthesis 05/09/2023   Need for shingles vaccine 09/22/2015   Neuromuscular disorder (HCC) 11/2019   sciatica   Nodule of left lung    Nonsustained ventricular tachycardia (HCC), identified on event  monitor November 2024, 1 episode 10 beats duration, asymptomatic. 08/06/2023   Pain of left hip joint 11/01/2018   Paroxysmal SVT (supraventricular tachycardia) (HCC), incidental finding IntraOp back surgery requiring esmolol ; event monitor November 2024 for short runs, longest episode 17 beats. 06/19/2023   Event monitor November 2024, supraventricular ectopy burden 1.7%.  4 short runs of SVT, longest episode 17 beats.  No symptoms reported     Personal history of congenital hip dysplasia 05/30/2013   Preventative health care 01/29/2015   Rhinitis, allergic 09/22/2015   Rosacea 05/30/2013   Scoliosis deformity of spine 05/05/2020   Spinal stenosis of lumbar region 06/16/2023   Spondylolisthesis at L5-S1 level 09/14/2020   Tobacco abuse 05/30/2013     Current Outpatient Medications  Medication Sig Dispense Refill   acetaminophen  (TYLENOL ) 500 MG tablet Take 500 mg by mouth every 6 (six) hours as needed for moderate pain (pain score 4-6).     albuterol  (VENTOLIN  HFA) 108 (90 Base) MCG/ACT inhaler Inhale 2 puffs into the lungs every 6 (six) hours as needed for wheezing or shortness of breath. 6.7 g 2   apixaban  (ELIQUIS ) 5 MG TABS tablet Take 1 tablet (5 mg total) by mouth 2 (two) times daily. 180 tablet 3   aspirin  EC 81 MG tablet Take 1 tablet (81 mg total) by mouth daily. Swallow whole.     calcium  carbonate (TUMS EX) 750 MG chewable tablet Chew 1-2 tablets by mouth  2 (two) times daily as needed for heartburn.     Clotrimazole  1 % OINT Apply to affected area twice daily as needed 56.7 g 0   doxycycline  (VIBRA -TABS) 100 MG tablet Take 1 tablet (100 mg total) by mouth every 12 (twelve) hours. (Patient taking differently: Take 1 tablet (100 mg total) by mouth every 12 (twelve) hours.) 2 tablet 0   empagliflozin  (JARDIANCE ) 10 MG TABS tablet Take 1 tablet (10 mg total) by mouth daily before breakfast. 90 tablet 1   fluticasone -salmeterol (WIXELA INHUB) 100-50 MCG/ACT AEPB Inhale 1 puff into the  lungs 2 (two) times daily. 60 each 11   furosemide  (LASIX ) 20 MG tablet Take 1 tablet (20 mg total) by mouth as directed. Take 20 mg daily and an additional 20 mg every other day (Patient taking differently: Take 20-40 mg by mouth in the morning. Take one tablet (20mg ) by mouth every other day.  On opposite days, take two tablets (40mg ) by mouth.) 135 tablet 3   ibuprofen  (ADVIL ) 200 MG tablet Take 200 mg by mouth every 6 (six) hours as needed for moderate pain (pain score 4-6).     loratadine  (CLARITIN ) 10 MG tablet Take 10 mg by mouth daily.     metoprolol  succinate (TOPROL  XL) 25 MG 24 hr tablet Take 1 tablet (25 mg total) by mouth daily. 90 tablet 3   Multiple Vitamins-Minerals (MULTIVITAMIN GUMMIES ADULT) CHEW Chew 8 each by mouth in the morning. Chew eight gummies by mouth daily in the morning.     Oxycodone  HCl 10 MG TABS Take 1 tablet by mouth every 6 (six) hours as needed.     OXYGEN  Inhale 4 L/min into the lungs continuous.     predniSONE  (DELTASONE ) 10 MG tablet Take 1 tablet (10 mg total) by mouth daily with breakfast. 5 tablet 0   Spacer/Aero-Holding Chambers DEVI 1 each by Does not apply route 4 (four) times daily as needed. 1 each 0   STELARA 45 MG/0.5ML injection Inject 45 mg into the skin as directed. Every 12 weeks     Timolol  Maleate, Once-Daily, 0.5 % SOLN Place 1 drop into both eyes in the morning and at bedtime.     Travoprost, BAK Free, (TRAVATAN) 0.004 % SOLN ophthalmic solution Place 1 drop into both eyes in the morning.     No current facility-administered medications for this visit.    Physical Exam BP 123/66 (BP Location: Left Arm)   Pulse 71   Resp 18   Ht 6' 2 (1.88 m)   Wt 177 lb (80.3 kg)   SpO2 97% Comment: 4L O2  BMI 22.46 kg/m  68 year old man in no acute distress Alert and oriented x 3 with no focal deficits Lungs diminished at left base with faint crackles bilaterally Cardiac regular rate and rhythm  Diagnostic Tests: CT CHEST WITHOUT  CONTRAST 07/09/2024 10:52:21 AM   TECHNIQUE: CT of the chest was performed without the administration of intravenous contrast. Multiplanar reformatted images are provided for review. Automated exposure control, iterative reconstruction, and/or weight based adjustment of the mA/kV was utilized to reduce the radiation dose to as low as reasonably achievable.   COMPARISON: CT Chest dated 01/08/2024 and chest radiograph dated 07/09/2024.   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 * Tracking Code: BO *   FINDINGS:   MEDIASTINUM: Heart and pericardium are unremarkable. The central airways are clear. Thoracic aortic, coronary artery, and branch  vessel atheromatous vascular calcifications.   LYMPH NODES: Anterior paratracheal node 1.1 cm in short axis on image 52 series 3, formerly 0.5 cm. Subcarinal lymph node 1.2 cm in short axis on image 76 series 3, formerly 0.6 cm. Right hilar lymph node 1.0 cm in short axis on image 78 series 3, formerly 0.6 cm. No axillary lymphadenopathy.   LUNGS AND PLEURA: Left lower lobectomy with resulting loss of volume in the left hemithorax. Severe emphysema. Peripheral calcified scarring in the lung apices favoring old granulomatous disease. Airway thickening suggest bronchitis or reactive airways disease. Indistinct consolidation posteriorly in the right lower lobe suspicious for pneumonia. Confluent interstitial and mild airspace opacity posteriorly in the left upper lobe potentially from aspiration or pneumonia. Airway plugging leading into this region. No pleural effusion or pneumothorax.   SOFT TISSUES/BONES: Lower thoracic spondylosis. No acute abnormality of the bones or soft tissues.   UPPER ABDOMEN: Limited images of the upper abdomen demonstrates abdominal aortic atherosclerosis.   IMPRESSION: 1. Indistinct consolidation posteriorly in the  right lower lobe suspicious for pneumonia. 2. Confluent interstitial and mild airspace opacity posteriorly in the left upper lobe potentially from aspiration pneumonitis pneumonia , with airway plugging. 3. Enlarged anterior paratracheal, subcarinal, and right hilar lymph nodes with interval increase in size, likely reactive. 4. Status post left lower lobectomy with associated left hemithorax volume loss. 5. Severe emphysema.  Coronary and aortic atherosclerosis. 6. Lower thoracic spondylosis.   Electronically signed by: Ryan Salvage MD 07/09/2024 03:28 PM EST RP Workstation: [id number]  I personally reviewed his chest x-ray image.  Official reading not done.  Postop changes on the left with volume loss.  Impression: Brett [name] is a 68 year old man with a history of tobacco use, COPD, lung cancer, left lower lobectomy, paroxysmal SVT, nonsustained VT, scoliosis, multiple hip and back surgeries, chronic narcotic dependent pain, rosacea, and glaucoma.  He underwent robotic assisted left lower lobectomy in July 2025.    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  Actually had 2 separate primaries, both stage Ia.  Has a follow-up appointment with Dr. Sherrod in February with a CT scan.  No significant findings on his CT last week.  Had some borderline adenopathy which is probably reactive related to his parainfluenza pneumonia.  That can be followed up in February.  The pain he describes sounds very similar to angina.  He has had an extensive cardiac workup including a stress PET back in February 2025 which showed heavy coronary calcification but no evidence of ischemia.  He has an appointment with Dr. Mallipeddi next week.  I told him to make sure he tells him about his pain at that visit.  Remains oxygen  dependent at this point.  Hopefully can wean down on that at some point in the future.  Plan: Return in February 2026 after CT by Dr.  Sherrod Elspeth JAYSON Kerrin, MD [facility] [phone]

## 2024-07-17 ENCOUNTER — Ambulatory Visit: Admitting: Family Medicine

## 2024-07-17 ENCOUNTER — Encounter: Payer: Self-pay | Admitting: Family Medicine

## 2024-07-17 VITALS — BP 112/60 | HR 61 | Temp 97.7°F | Ht 74.0 in | Wt 179.6 lb

## 2024-07-17 DIAGNOSIS — Z09 Encounter for follow-up examination after completed treatment for conditions other than malignant neoplasm: Secondary | ICD-10-CM | POA: Diagnosis not present

## 2024-07-17 DIAGNOSIS — J441 Chronic obstructive pulmonary disease with (acute) exacerbation: Secondary | ICD-10-CM | POA: Diagnosis not present

## 2024-07-17 DIAGNOSIS — J439 Emphysema, unspecified: Secondary | ICD-10-CM

## 2024-07-17 DIAGNOSIS — J122 Parainfluenza virus pneumonia: Secondary | ICD-10-CM | POA: Diagnosis not present

## 2024-07-17 DIAGNOSIS — N481 Balanitis: Secondary | ICD-10-CM

## 2024-07-17 LAB — CBC WITH DIFFERENTIAL/PLATELET
Basophils Absolute: 0.1 K/uL (ref 0.0–0.1)
Basophils Relative: 1.1 % (ref 0.0–3.0)
Eosinophils Absolute: 0.2 K/uL (ref 0.0–0.7)
Eosinophils Relative: 2.1 % (ref 0.0–5.0)
HCT: 41.7 % (ref 39.0–52.0)
Hemoglobin: 13.7 g/dL (ref 13.0–17.0)
Lymphocytes Relative: 26.7 % (ref 12.0–46.0)
Lymphs Abs: 2 K/uL (ref 0.7–4.0)
MCHC: 32.9 g/dL (ref 30.0–36.0)
MCV: 97 fl (ref 78.0–100.0)
Monocytes Absolute: 1.2 K/uL — ABNORMAL HIGH (ref 0.1–1.0)
Monocytes Relative: 16.3 % — ABNORMAL HIGH (ref 3.0–12.0)
Neutro Abs: 4.1 K/uL (ref 1.4–7.7)
Neutrophils Relative %: 53.8 % (ref 43.0–77.0)
Platelets: 312 K/uL (ref 150.0–400.0)
RBC: 4.3 Mil/uL (ref 4.22–5.81)
RDW: 13.3 % (ref 11.5–15.5)
WBC: 7.6 K/uL (ref 4.0–10.5)

## 2024-07-17 NOTE — Progress Notes (Signed)
 Acute Office Visit  Subjective:  Patient ID: Brett [name], male    DOB: 1956/05/24  Age: 68 y.o. MRN: [id number]  CC:  Chief Complaint  Patient presents with   Follow-up    ED follow up      HPI Brett [name] is here for hospital follow-up. Seen in the emergency department on 07/09/2024 reporting progressive shortness of breath with associated sick symptoms. Labs with White count 6.7, hemoglobin 13.1, platelets 189.  Creatinine 1.  Glucose 131.  Troponin within normal limits.  BNP of 223.  Strep negative.  Chest x-ray noted prior scarring as well as concern for pneumonia.  Parainfluenza positive. EKG NSR. He notably has a history of lung cancer and is recently status post lobectomy. He was treated with antibiotics and steroids.   Discussed the use of AI scribe software for clinical note transcription with the patient, who gave verbal consent to proceed.  History of Present Illness Brett [name] is a 68 year old male presents for hospital follow-up.  He recently experienced a COPD exacerbation with concern for pneumonia and tested positive for parainfluenza, leading to hospitalization where he was treated with antibiotics, steroids, and oxygen  therapy. He was discharged on his baseline O2 nasal canula and reports feeling back to baseline.   He has a history of genital infections associated with the use of Jardiance , which he has experienced twice before. He recently started using a cream to manage a recurrence of this issue, noting that it typically worsens before improving. States he will continue monitor and let PCP know if recurrence so that medication can be adjusted.   His surgeon had a follow-up visit yesterday and chest xray was stable improved patchy airspace opacities in left lower lung. He is due to follow-up and repeat CT in February. Reports he sees his pulmonologist next week and has PCP and cardiology follow-up in January.       Past Medical  History:  Diagnosis Date   Allergy    Arthritis    Asthma    Atrial fibrillation (HCC) 04/29/2024   Bilateral low back pain without sciatica 09/22/2015   Blood transfusion without reported diagnosis    Cancer (HCC)    Left Lower Lobe Adenocarcinoma   Cardiomyopathy (HCC)    Cervical neck pain with evidence of disc disease 09/22/2015   Chicken pox    Chronic CHF (congestive heart failure) (HCC) 03/26/2024   Congenital hip deformity    Corn of foot 08/09/2022   Degenerative disc disease, cervical 01/29/2015   Dysrhythmia    SVT   Eczema 05/30/2013   Erectile dysfunction 09/22/2015   Glaucoma 05/30/2013   History of cardiac dysrhythmia 05/23/2023   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 08/06/2023   Pain of left hip joint 11/01/2018   Paroxysmal SVT (supraventricular tachycardia) (HCC), incidental finding IntraOp back surgery requiring esmolol ; event monitor November 2024 for short runs, longest episode 17 beats. 06/19/2023   Event monitor November 2024, supraventricular ectopy burden 1.7%.  4 short runs of SVT, longest episode 17 beats.  No symptoms reported     Personal history of congenital hip dysplasia 05/30/2013   Preventative health care 01/29/2015   Rhinitis, allergic 09/22/2015  Rosacea 05/30/2013   Scoliosis deformity of spine 05/05/2020   Spinal stenosis of lumbar region 06/16/2023   Spondylolisthesis at L5-S1 level 09/14/2020   Tobacco abuse 05/30/2013    Past Surgical History:  Procedure Laterality Date   ANTERIOR LAT LUMBAR FUSION Right 05/09/2023   Procedure: Extreme Lateral Interbody Fusion Lumbar three-four -right;  Surgeon: Brett Shove, MD;  Location:  [facility] OR;  Service: Neurosurgery;  Laterality: Right;  3C   BRONCHOSCOPY, WITH BIOPSY USING ELECTROMAGNETIC NAVIGATION Bilateral 01/09/2024   Procedure: ROBOTIC ASSISTED NAVIGATIONAL BRONCHOSCOPY;  Surgeon: Brett Hose, MD;  Location: [facility] ORS;  Service: Pulmonary;  Laterality: Bilateral;   COLONOSCOPY  06/17/2015   Pyrtle   COLONOSCOPY  07/20/2020   Pyrtle   COLONOSCOPY  02/15/2024   ENDOBRONCHIAL ULTRASOUND Bilateral 01/09/2024   Procedure: ENDOBRONCHIAL ULTRASOUND (EBUS);  Surgeon: Brett Hose, MD;  Location: [facility] ORS;  Service: Pulmonary;  Laterality: Bilateral;   HAMMER TOE SURGERY  07/28/2011   Procedure: HAMMER TOE CORRECTION;  Surgeon: Brett JULIANNA Chancy, MD;  Location:  SURGERY CENTER;  Service: Orthopedics;  Laterality: Right;  right 2nd and 4th toes correction hammer toe, capsulotomy metatarsal-phalangeal joints   HERNIA REPAIR Bilateral    HIP SURGERY  1986 & 2010   rt total hip-8/10-multiple rt hip surgeries- had 4 prior to 1986 as a child   INTERCOSTAL NERVE BLOCK Left 02/19/2024   Procedure: BLOCK, NERVE, INTERCOSTAL;  Surgeon: Brett Elspeth BROCKS, MD;  Location: [facility] OR;  Service: Thoracic;  Laterality: Left;   JOINT REPLACEMENT  2010   Partial Right Hip   LOBECTOMY, LUNG, ROBOT-ASSISTED, USING VATS Left 02/19/2024   Procedure: LOBECTOMY, LUNG, ROBOT-ASSISTED, USING VATS;  Surgeon: Brett Elspeth BROCKS, MD;  Location: [facility] OR;  Service: Thoracic;  Laterality: Left;  ROBOTIC LEFT LOWER LOBECTOMY   lumber fusion  09/2020   POLYPECTOMY     RIGHT/LEFT HEART CATH AND CORONARY ANGIOGRAPHY N/A 05/10/2024   Procedure: RIGHT/LEFT HEART CATH AND CORONARY ANGIOGRAPHY;  Surgeon: Brett Sharper, MD;  Location: [facility] INVASIVE CV LAB;  Service: Cardiovascular;  Laterality: N/A;   SENTINEL NODE BIOPSY Left 02/19/2024   Procedure: BIOPSY, LYMPH NODE;  Surgeon: Brett Elspeth BROCKS, MD;  Location: [facility] OR;  Service: Thoracic;  Laterality: Left;   SPINE SURGERY  09/14/2020   THUMB  ARTHROSCOPY  2008   rt   TOTAL HIP ARTHROPLASTY Right 1985   VIDEO BRONCHOSCOPY N/A 02/23/2024   Procedure: VIDEO BRONCHOSCOPY WITHOUT FLUORO;  Surgeon: Brett Elspeth BROCKS, MD;  Location: [facility] ENDOSCOPY;  Service: Thoracic;  Laterality: N/A;  Bronch with IBV insertion   VIDEO BRONCHOSCOPY WITH INSERTION OF INTERBRONCHIAL VALVE (IBV) N/A 02/23/2024   Procedure: BRONCHOSCOPY, FLEXIBLE, WITH INTRABRONCHIAL VALVE INSERTION;  Surgeon: Brett Elspeth BROCKS, MD;  Location: [facility] ENDOSCOPY;  Service: Thoracic;  Laterality: N/A;    Family History  Problem Relation Age of Onset   Cancer Mother 60       history of colon cancer   Rosacea Mother    Colon cancer Mother 58   Colon polyps Mother    Rosacea Father    Lung disease Father        ?pulmonary fibrosis   Alcohol abuse Brother    Depression Brother    Colon cancer Maternal Grandmother    Endocrine tumor Daughter        pituitary tumor, POTTS   Thyroid  disease Son        ?hyperthyroid   Cancer Cousin        colon   Colon cancer  Cousin    Esophageal cancer Neg Hx    Rectal cancer Neg Hx    Stomach cancer Neg Hx     Social History   Socioeconomic History   Marital status: Widowed    Spouse name: Not on file   Number of children: Not on file   Years of education: Not on file   Highest education level: Associate degree: occupational, scientist, product/process development, or vocational program  Occupational History   Not on file  Tobacco Use   Smoking status: Former    Current packs/day: 0.00    Average packs/day: 0.5 packs/day for 50.3 years (25.2 ttl pk-yrs)    Types: Cigarettes    Start date: 30    Quit date: 12/11/2023    Years since quitting: 0.6   Smokeless tobacco: Never   Tobacco comments:    Started smoking at 68 years old.    Smoked 1 PPD at his heaviest.    Quit smoking on 12/11/2023  Vaping Use   Vaping status: Some Days  Substance and Sexual Activity   Alcohol use: Yes    Alcohol/week: 14.0 standard drinks of alcohol    Types: 14  Standard drinks or equivalent per week   Drug use: No   Sexual activity: Not Currently    Birth control/protection: None  Other Topics Concern   Not on file  Social History Narrative   Quality control Tech- gauges/callibrations.  (Retired)   Some college/tech school   wife passed   3 grown children (oldest daughter is living with them) youngest daughter lives in [place].  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  Son lives near [place]- framing/art.   Social Drivers of Corporate Investment Banker Strain: Low Risk  (05/31/2024)   Overall Financial Resource Strain (CARDIA)    Difficulty of Paying Living Expenses: Not very hard  Food Insecurity: No Food Insecurity (07/12/2024)   Hunger Vital Sign    Worried About Running Out of Food in the Last Year: Never true    Ran Out of Food in the Last Year: Never true  Transportation Needs: No Transportation Needs (07/12/2024)   PRAPARE - Administrator, Civil Service (Medical): No    Lack of Transportation (Non-Medical): No  Physical Activity: Insufficiently Active (05/31/2024)   Exercise Vital Sign    Days of Exercise per Week: 2 days    Minutes of Exercise per Session: 30 min  Stress: No Stress Concern Present (05/31/2024)   [employer] of Occupational Health - Occupational Stress Questionnaire    Feeling of Stress: Only a little  Social Connections: Moderately Integrated (07/09/2024)   Social Connection and Isolation Panel    Frequency of Communication with Friends and Family: Three times a week    Frequency of Social Gatherings with Friends and Family: Once a week    Attends Religious Services: 1 to 4 times per year    Active Member of [employer] or Organizations: Yes    Attends Banker Meetings: 1 to 4 times per year    Marital Status: Widowed  Intimate Partner Violence: Not At Risk (07/12/2024)   Humiliation, Afraid, Rape, and Kick questionnaire    Fear of Current or Ex-Partner: No    Emotionally Abused: No     Physically Abused: No    Sexually Abused: No    ROS All ROS negative except what is listed in the HPI.   Objective:   Today's Vitals: BP 112/60 (BP Location: Left Arm, Patient Position: Sitting, Cuff Size: Normal)  Pulse 61   Temp 97.7 F (36.5 C) (Oral)   Ht 6' 2 (1.88 m)   Wt 179 lb 9.6 oz (81.5 kg)   SpO2 99%   BMI 23.06 kg/m   Physical Exam Vitals reviewed.  Constitutional:      Appearance: Normal appearance.  Cardiovascular:     Rate and Rhythm: Normal rate and regular rhythm.  Pulmonary:     Effort: Pulmonary effort is normal.     Comments: Faint crackles, no distress Genitourinary:    Comments: Deferred  Skin:    General: Skin is warm and dry.  Neurological:     Mental Status: He is alert and oriented to person, place, and time.  Psychiatric:        Mood and Affect: Mood normal.        Behavior: Behavior normal.        Thought Content: Thought content normal.        Judgment: Judgment normal.        Assessment & Plan:   Problem List Items Addressed This Visit   None Visit Diagnoses     Hospital discharge follow-up    -  Primary COPD Balanitis  COPD exacerbation with parainfluenza pneumonia Recent hospitalization for COPD exacerbation with parainfluenza pneumonia. Symptoms improved post-discharge. Chest x-ray showed improvement. - Continue oxygen  therapy  - Keep pulmonology appointment next week and cardiology and PCP appointments in January - Monitor for signs of infection or respiratory distress. - Continue follow-up with oncology for CT scan in February.  Recurrent balanitis Recurrent balanitis possibly exacerbated by hospitalization and Jardiance . Discussed potential need to discontinue Jardiance  if infections persist. - Continue using topical cream for balanitis. - Monitor for worsening symptoms or persistent infection. - Discuss potential medication adjustment with prescribing physician if infections persist.     Relevant Orders    CBC w/Diff       Follow-up: Return for  - keep upcoming follow-ups.   Brett FURY Almarie, DNP, FNP-C  I,Emily Lagle,acting as a neurosurgeon for Brett KATHEE Almarie, NP.,have documented all relevant documentation on the behalf of Brett KATHEE Almarie, NP.   I, Brett KATHEE Almarie, NP, have reviewed all documentation for this visit. The documentation on 07/17/2024 for the exam, diagnosis, procedures, and orders are all accurate and complete.

## 2024-07-18 ENCOUNTER — Encounter (HOSPITAL_COMMUNITY)

## 2024-07-18 ENCOUNTER — Ambulatory Visit: Payer: Self-pay | Admitting: Family Medicine

## 2024-07-18 DIAGNOSIS — Z79899 Other long term (current) drug therapy: Secondary | ICD-10-CM | POA: Diagnosis not present

## 2024-07-18 DIAGNOSIS — M5136 Other intervertebral disc degeneration, lumbar region with discogenic back pain only: Secondary | ICD-10-CM | POA: Diagnosis not present

## 2024-07-19 ENCOUNTER — Other Ambulatory Visit: Payer: Self-pay

## 2024-07-19 NOTE — Transitions of Care (Post Inpatient/ED Visit) (Signed)
 Transition of Care week 2  Visit Note  07/19/2024  Name: Brett [name] MRN: [id number]          DOB: [birth year]  Situation: Patient enrolled in [facility] 30-day program. Visit completed with Bob Ausley by telephone.   Background:   Initial Transition Care Management Follow-up Telephone Call Discharge Date and Diagnosis: 07/11/24, Parainfluenza   Past Medical History:  Diagnosis Date   Allergy    Arthritis    Asthma    Atrial fibrillation (HCC) 04/29/2024   Bilateral low back pain without sciatica 09/22/2015   Blood transfusion without reported diagnosis    Cancer (HCC)    Left Lower Lobe Adenocarcinoma   Cardiomyopathy (HCC)    Cervical neck pain with evidence of disc disease 09/22/2015   Chicken pox    Chronic CHF (congestive heart failure) (HCC) 03/26/2024   Congenital hip deformity    Corn of foot 08/09/2022   Degenerative disc disease, cervical 01/29/2015   Dysrhythmia    SVT   Eczema 05/30/2013   Erectile dysfunction 09/22/2015   Glaucoma 05/30/2013   History of cardiac dysrhythmia 05/23/2023   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 08/06/2023   Pain of left hip joint 11/01/2018   Paroxysmal SVT (supraventricular tachycardia) (HCC), incidental finding IntraOp back surgery requiring esmolol ; event monitor November 2024 for short runs, longest episode 17 beats. 06/19/2023   Event monitor November 2024, supraventricular ectopy burden 1.7%.  4 short runs of SVT, longest episode 17 beats.  No symptoms reported     Personal history of congenital hip dysplasia 05/30/2013   Preventative health care 01/29/2015   Rhinitis, allergic 09/22/2015   Rosacea 05/30/2013    Scoliosis deformity of spine 05/05/2020   Spinal stenosis of lumbar region 06/16/2023   Spondylolisthesis at L5-S1 level 09/14/2020   Tobacco abuse 05/30/2013    Assessment: Patient Reported Symptoms: Cognitive Cognitive Status: Alert and oriented to person, place, and time, Normal speech and language skills      Neurological Neurological Review of Symptoms: No symptoms reported    HEENT HEENT Symptoms Reported: No symptoms reported      Cardiovascular Cardiovascular Symptoms Reported: Swelling in legs or feet Does patient have uncontrolled Hypertension?: No Cardiovascular Management Strategies: Medication therapy, Routine screening Cardiovascular Comment: The patient takes Diuretics for the swelling  Respiratory Respiratory Symptoms Reported: Shortness of breath Additional Respiratory Details: SOB with exertion Respiratory Management Strategies: Oxygen  therapy, Routine screening, Medication therapy, Activity, Coping strategies  Endocrine Endocrine Symptoms Reported: No symptoms reported Is patient diabetic?: No    Gastrointestinal Gastrointestinal Symptoms Reported: No symptoms reported      Genitourinary Genitourinary Symptoms Reported: No symptoms reported    Integumentary Integumentary Symptoms Reported: No symptoms reported    Musculoskeletal Musculoskelatal Symptoms Reviewed: Weakness, Limited mobility Musculoskeletal Management Strategies: Medical device, Exercise      Psychosocial Psychosocial Symptoms Reported: No symptoms reported         There were no vitals filed for this visit. Pain Score: 6  Pain Type: Chronic pain Pain Location: Back Pain Orientation: Lower, Mid Pain Descriptors / Indicators: Aching Pain Onset: On-going Patients Stated Pain Goal: 0 Pain Intervention(s): Medication (See eMAR)  Medications Reviewed Today     Reviewed by [name],  RN (Case Manager) on 07/19/24 at 1200  Med List Status: <None>   Medication Order Taking?  Sig Documenting Provider Last Dose Status Informant  acetaminophen  (TYLENOL ) 500 MG tablet [id number] Yes Take 500 mg by mouth every 6 (six) hours as needed for moderate pain (pain score 4-6). [provider]  Active Self, Pharmacy Records           Med Note LEOBARDO, NICOLE   Tue Jul 09, 2024  7:38 AM) Emily on opposite days from ibuprofen .   albuterol  (VENTOLIN  HFA) 108 (90 Base) MCG/ACT inhaler [id number] Yes Inhale 2 puffs into the lungs every 6 (six) hours as needed for wheezing or shortness of breath. Samtani, Jai-Gurmukh, MD  Active   apixaban  (ELIQUIS ) 5 MG TABS tablet [id number] Yes Take 1 tablet (5 mg total) by mouth 2 (two) times daily. MadireddyAlean [name], MD  Active Self, Pharmacy Records  aspirin  EC 81 MG tablet [id number] Yes Take 1 tablet (81 mg total) by mouth daily. Swallow whole. [name], [name], MD  Active Self, Pharmacy Records  calcium  carbonate (TUMS EX) 750 MG chewable tablet [id number] Yes Chew 1-2 tablets by mouth 2 (two) times daily as needed for heartburn. [provider]  Active Self, Pharmacy Records  Clotrimazole  1 % OINT [id number] Yes Apply to affected area twice daily as needed [name], NP  Active Self, Pharmacy Records  doxycycline  (VIBRA -TABS) 100 MG tablet [id number]  Take 1 tablet (100 mg total) by mouth every 12 (twelve) hours.  Patient not taking: Reported on 07/17/2024   Samtani, Jai-Gurmukh, MD  Active   empagliflozin  (JARDIANCE ) 10 MG TABS tablet [id number] Yes Take 1 tablet (10 mg total) by mouth daily before breakfast. [name], [name], MD  Active Self, Pharmacy Records  fluticasone -salmeterol [facility] INHUB) 100-50 MCG/ACT AEPB [id number] Yes Inhale 1 puff into the lungs 2 (two) times daily. Isadora Hose, MD  Active Self, Pharmacy Records  furosemide  (LASIX ) 20 MG tablet [id number] Yes Take 1 tablet (20 mg total) by mouth as directed. Take 20 mg daily and an additional 20 mg every other day  Patient taking differently: Take  20-40 mg by mouth in the morning. Take one tablet (20mg ) by mouth every other day.  On opposite days, take two tablets (40mg ) by mouth.   MadireddyAlean [name], MD  Active Self, Pharmacy Records           Med Note LEOBARDO, NICOLE   Tue Jul 09, 2024  7:37 AM) Two tablets taken on 07/08/24  ibuprofen  (ADVIL ) 200 MG tablet [id number] Yes Take 200 mg by mouth every 6 (six) hours as needed for moderate pain (pain score 4-6). [provider]  Active Self, Pharmacy Records           Med Note LEOBARDO, NICOLE   Tue Jul 09, 2024  7:38 AM) Emily on opposite days from acetaminophen    loratadine  (CLARITIN ) 10 MG tablet [id number] Yes Take 10 mg by mouth daily. [provider]  Active Self, Pharmacy Records  metoprolol  succinate (TOPROL  XL) 25 MG 24 hr tablet [id number] Yes Take 1 tablet (25 mg total) by mouth daily. [name], [name], MD  Active Self, Pharmacy Records  Multiple Vitamins-Minerals (MULTIVITAMIN GUMMIES ADULT) CHEW [id number] Yes Chew 8 each by mouth in the morning. Chew eight gummies by mouth daily in the morning. [provider]  Active Self, Pharmacy Records  Oxycodone  HCl 10 MG TABS [id number] Yes Take 1 tablet by mouth every 6 (six) hours as needed. [provider]  Active Self, Pharmacy Records  OXYGEN  [id number] Yes Inhale 4 L/min into the lungs continuous. [provider]  Active Self, Pharmacy Records  predniSONE  (DELTASONE ) 10 MG tablet [id number]  Take 1 tablet (10 mg total) by mouth daily with breakfast.  Patient not taking: Reported on 07/17/2024   Samtani, Jai-Gurmukh, MD  Active   Spacer/Aero-Holding Chambers DEVI [id number] Yes 1 each by Does not apply route 4 (four) times daily as needed. Samtani, Jai-Gurmukh, MD  Active   STELARA 45 MG/0.5ML injection [id number] Yes Inject 45 mg into the skin as directed. Every 12 weeks [provider]  Active Self, Pharmacy Records           Med Note (GARNER, TIFFANY L   Mon Apr 29, 2024 10:05 AM)     Timolol  Maleate, Once-Daily, 0.5 % SOLN [id number] Yes Place 1 drop into both eyes in the morning and at bedtime. [provider]  Active Self, Pharmacy Records  Travoprost, BAK Free, (TRAVATAN) 0.004 % SOLN ophthalmic solution [id number] Yes Place 1 drop into both eyes in the morning. [provider]  Active Self, Pharmacy Records            Recommendation:   Continue Current Plan of Care  Follow Up Plan:   Telephone follow-up in 1 week  Medford Balboa, BSN, RN [place]  [facility] - [facility] RN Care Manager [phone]

## 2024-07-19 NOTE — Patient Instructions (Signed)
 Visit Information  Thank you for taking time to visit with me today. Please don't hesitate to contact me if I can be of assistance to you before our next scheduled telephone appointment.  Our next appointment is by telephone on Friday December 18th at 11:30am  Following is a copy of your care plan:   Goals Addressed             This Visit's Progress    VBCI Transitions of Care (TOC) Care Plan       Problems: (reviewed 07/19/24) Recent Hospitalization for treatment of COPD exacerbation due to [facility] or ED Adm Risk 90%  Goal: (reviewed 07/19/24) Over the next 30 days, the patient will not experience hospital readmission  Interventions: (reviewed 07/19/24)  COPD Interventions: Advised patient to engage in light exercise as tolerated 3-5 days a week to aid in the the management of COPD Advised patient to track and manage COPD triggers Discussed Pulmonary Rehab and offered to assist with referral placement Discussed the importance of adequate rest and management of fatigue with COPD Provided education about and advised patient to utilize infection prevention strategies to reduce risk of respiratory infection Provided instruction about proper use of medications used for management of COPD including inhalers Screening for signs and symptoms of depression related to chronic disease state  Use of home oxygen  Education on the use of a spacer for medication administration Oxygen  at 4l/min continuous Participate in Pulmonary Rehab as directed by the provider  Patient Self Care Activities: (reviewed 07/19/24) Attend all scheduled provider appointments Call pharmacy for medication refills 3-7 days in advance of running out of medications Call provider office for new concerns or questions  Notify RN Care Manager of [facility] call rescheduling needs Participate in Transition of Care Program/Attend [facility] scheduled calls Perform all self care activities independently  Take  medications as prescribed    Plan:  Telephone follow up appointment with care management team member scheduled for:  Friday December 18th at 11:30am        Patient verbalizes understanding of instructions and care plan provided today and agrees to view in MyChart. Active MyChart status and patient understanding of how to access instructions and care plan via MyChart confirmed with patient.     The patient has been provided with contact information for the care management team and has been advised to call with any health related questions or concerns.   Please call the care guide team at [phone] if you need to cancel or reschedule your appointment.   Please call the Suicide and Crisis Lifeline: 988 call the [place]  National Suicide Prevention Lifeline: 406-224-2009 or TTY: 9497079017 TTY (343)554-7117) to talk to a trained counselor if you are experiencing a Mental Health or Behavioral Health Crisis or need someone to talk to.  Medford Balboa, BSN, RN [place]  VBCI - [employer] [facility] RN Care Manager [phone]

## 2024-07-23 ENCOUNTER — Ambulatory Visit: Admitting: Pulmonary Disease

## 2024-07-23 ENCOUNTER — Encounter (HOSPITAL_COMMUNITY)

## 2024-07-24 ENCOUNTER — Ambulatory Visit: Admitting: Student in an Organized Health Care Education/Training Program

## 2024-07-24 ENCOUNTER — Encounter: Payer: Self-pay | Admitting: Student in an Organized Health Care Education/Training Program

## 2024-07-24 VITALS — BP 126/80 | HR 78 | Temp 98.1°F | Ht 74.0 in | Wt 182.6 lb

## 2024-07-24 DIAGNOSIS — Z9981 Dependence on supplemental oxygen: Secondary | ICD-10-CM

## 2024-07-24 DIAGNOSIS — J449 Chronic obstructive pulmonary disease, unspecified: Secondary | ICD-10-CM

## 2024-07-24 DIAGNOSIS — C3432 Malignant neoplasm of lower lobe, left bronchus or lung: Secondary | ICD-10-CM | POA: Diagnosis not present

## 2024-07-24 DIAGNOSIS — J432 Centrilobular emphysema: Secondary | ICD-10-CM

## 2024-07-24 DIAGNOSIS — Z902 Acquired absence of lung [part of]: Secondary | ICD-10-CM

## 2024-07-24 DIAGNOSIS — J9611 Chronic respiratory failure with hypoxia: Secondary | ICD-10-CM

## 2024-07-24 NOTE — Patient Instructions (Signed)
°  VISIT SUMMARY: You came in today for a follow-up visit after your recent hospitalization for a COPD exacerbation. We discussed your ongoing issues with COPD, your history of lung cancer, and your concerns about life expectancy and cancer recurrence. We also reviewed your current treatments and made plans for your continued care.  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 You will continue using oxygen  therapy at 4 liters per minute, continue with pulmonary rehabilitation, and keep using your Wixela inhaler as prescribed. Use your emergency inhaler as needed.  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 Your recent CT scan showed no evidence of cancer recurrence. We discussed the importance of smoking cessation to prevent further health issues.  INSTRUCTIONS: Please continue with your current treatments and follow up with your cardiologist in January. If you experience any worsening of symptoms or new issues, contact our office immediately.      Contains text generated by [employer].

## 2024-07-24 NOTE — Progress Notes (Unsigned)
 Synopsis: Referred in *** by [name], NP  Assessment & Plan:   Assessment and Plan Assessment & Plan Chronic obstructive pulmonary disease with chronic hypoxic respiratory failure and oxygen  dependence COPD exacerbation in December treated with antibiotics and steroids. Parainfluenza virus positive during hospitalization. Oxygen  dependent post left lower lobectomy. Current oxygen  levels stable with supplemental oxygen . Exertional dyspnea and occasional hypoxemia persist. Inhaler confirmed functioning properly. Rheumatoid arthritis may contribute to symptoms, no direct lung impact noted. - Continue oxygen  therapy at 4 L/min. - Continue pulmonary rehabilitation. - Continue Wixela inhaler as prescribed. - Use emergency inhaler as needed.  History of stage II non-small cell lung cancer status post left lower lobectomy Status post left lower lobectomy for stage II non-small cell lung cancer. Recent CT scan showed no evidence of recurrence. Discussed life expectancy and recurrence risk, emphasizing smoking cessation to prevent further deterioration.   Return in about 6 months (around 01/22/2025).  Belva November, MD [facility] 07/24/2024 2:17 PM   I spent *** minutes caring for this patient today, including {EM billing:28027}  End of visit medications:  No orders of the defined types were placed in this encounter.   Current Medications[1]   Subjective:   PATIENT ID: Brett [name] GENDER: male DOB: Sep 05, 1955, MRN: [id number]  Chief Complaint  Patient presents with   Medical Management of Chronic Issues    Occasional wheezing. Shortness of breath.     HPI  Discussed the use of AI scribe software for clinical note transcription with the patient, who gave verbal consent to proceed.  History of Present Illness Brett [name] is a 68 year old male with stage two non-small cell lung cancer and COPD who presents for follow-up after a  recent hospitalization for COPD exacerbation.  He has a history of stage two non-small cell lung cancer, status post left lower lobectomy, and remains oxygen  dependent following the surgery. He is concerned about his life expectancy and the potential for cancer recurrence, given his past smoking history and previous lung cancer diagnosis.  He has central lobular emphysema and chronic hypoxic respiratory failure. He was started on long-acting inhalers with ICS LABA in September for COPD and underwent pulmonary rehabilitation. He was hospitalized from December 2nd to December 4th for a COPD exacerbation, treated with antibiotics and steroids. During hospitalization, he tested positive for parainfluenza virus. He experienced a severe sore throat and difficulty breathing during the episode, requiring ambulance transport. His oxygen  levels drop with exertion but recover quickly with rest and oxygen  use.  He is currently using oxygen  at a rate of four liters per minute. He has difficulty with physical exertion, such as walking or moving in bed, which leads to shortness of breath. He uses an inhaler but does not feel a significant difference in his symptoms. He also uses an emergency inhaler with a spacer but does not perceive any effect from it.  He has a history of atrial fibrillation and is concerned about its impact on his breathing difficulties. He has an upcoming appointment with his cardiologist in January. He also has a history of rheumatoid arthritis, with recent lab results showing elevated inflammatory markers.   Ancillary information including prior medications, full medical/surgical/family/social histories, and PFTs (when available) are listed below and have been reviewed.   {PULM QUESTIONNAIRES (Optional):33196}  ROS   Objective:   Vitals:   07/24/24 1351  BP: 126/80  Pulse: 78  Temp: 98.1 F (36.7 C)  TempSrc: Temporal  SpO2: 96%  Weight: 182 lb 9.6 oz (82.8 kg)  Height: 6' 2  (1.88 m)   96% on *** LPM *** RA BMI Readings from Last 3 Encounters:  07/24/24 23.44 kg/m  07/17/24 23.06 kg/m  07/16/24 22.73 kg/m   Wt Readings from Last 3 Encounters:  07/24/24 182 lb 9.6 oz (82.8 kg)  07/17/24 179 lb 9.6 oz (81.5 kg)  07/16/24 177 lb (80.3 kg)    .vitalsmbmi  Physical Exam    Ancillary Information    Past Medical History:  Diagnosis Date   Allergy    Arthritis    Asthma    Atrial fibrillation (HCC) 04/29/2024   Bilateral low back pain without sciatica 09/22/2015   Blood transfusion without reported diagnosis    Cancer (HCC)    Left Lower Lobe Adenocarcinoma   Cardiomyopathy (HCC)    Cervical neck pain with evidence of disc disease 09/22/2015   Chicken pox    Chronic CHF (congestive heart failure) (HCC) 03/26/2024   Congenital hip deformity    Corn of foot 08/09/2022   Degenerative disc disease, cervical 01/29/2015   Dysrhythmia    SVT   Eczema 05/30/2013   Erectile dysfunction 09/22/2015   Glaucoma 05/30/2013   History of cardiac dysrhythmia 05/23/2023   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 08/06/2023   Pain of left hip joint 11/01/2018   Paroxysmal SVT (supraventricular tachycardia) (HCC), incidental finding IntraOp back surgery requiring esmolol ; event monitor November 2024 for short runs, longest episode 17 beats. 06/19/2023   Event monitor November 2024, supraventricular ectopy burden 1.7%.  4 short runs of SVT, longest episode 17 beats.  No symptoms reported     Personal history of congenital hip dysplasia 05/30/2013   Preventative health care 01/29/2015   Rhinitis, allergic 09/22/2015   Rosacea 05/30/2013   Scoliosis  deformity of spine 05/05/2020   Spinal stenosis of lumbar region 06/16/2023   Spondylolisthesis at L5-S1 level 09/14/2020   Tobacco abuse 05/30/2013     Family History  Problem Relation Age of Onset   Cancer Mother 33       history of colon cancer   Rosacea Mother    Colon cancer Mother 92   Colon polyps Mother    Rosacea Father    Lung disease Father        ?pulmonary fibrosis   Alcohol abuse Brother    Depression Brother    Colon cancer Maternal Grandmother    Endocrine tumor Daughter        pituitary tumor, POTTS   Thyroid  disease Son        ?hyperthyroid   Cancer Cousin        colon   Colon cancer Cousin    Esophageal cancer Neg Hx    Rectal cancer Neg Hx    Stomach cancer Neg Hx      Past Surgical History:  Procedure Laterality Date   ANTERIOR LAT LUMBAR FUSION Right 05/09/2023   Procedure: Extreme Lateral Interbody Fusion Lumbar three-four -right;  Surgeon: Louis Shove, MD;  Location: [facility] OR;  Service: Neurosurgery;  Laterality: Right;  3C   BRONCHOSCOPY, WITH BIOPSY USING ELECTROMAGNETIC NAVIGATION Bilateral 01/09/2024   Procedure: ROBOTIC ASSISTED NAVIGATIONAL BRONCHOSCOPY;  Surgeon: Isadora Hose, MD;  Location: [facility]  ORS;  Service: Pulmonary;  Laterality: Bilateral;   COLONOSCOPY  06/17/2015   Pyrtle   COLONOSCOPY  07/20/2020   Pyrtle   COLONOSCOPY  02/15/2024   ENDOBRONCHIAL ULTRASOUND Bilateral 01/09/2024   Procedure: ENDOBRONCHIAL ULTRASOUND (EBUS);  Surgeon: Isadora Hose, MD;  Location: [facility] ORS;  Service: Pulmonary;  Laterality: Bilateral;   HAMMER TOE SURGERY  07/28/2011   Procedure: HAMMER TOE CORRECTION;  Surgeon: Toribio JULIANNA Chancy, MD;  Location: [facility];  Service: Orthopedics;  Laterality: Right;  right 2nd and 4th toes correction hammer toe, capsulotomy metatarsal-phalangeal joints   HERNIA REPAIR Bilateral    HIP SURGERY  1986 & 2010   rt total hip-8/10-multiple rt hip surgeries- had 4 prior to 1986 as a child   INTERCOSTAL NERVE  BLOCK Left 02/19/2024   Procedure: BLOCK, NERVE, INTERCOSTAL;  Surgeon: Kerrin Elspeth BROCKS, MD;  Location: [facility] OR;  Service: Thoracic;  Laterality: Left;   JOINT REPLACEMENT  2010   Partial Right Hip   LOBECTOMY, LUNG, ROBOT-ASSISTED, USING VATS Left 02/19/2024   Procedure: LOBECTOMY, LUNG, ROBOT-ASSISTED, USING VATS;  Surgeon: Kerrin Elspeth BROCKS, MD;  Location: [facility] OR;  Service: Thoracic;  Laterality: Left;  ROBOTIC LEFT LOWER LOBECTOMY   lumber fusion  09/2020   POLYPECTOMY     RIGHT/LEFT HEART CATH AND CORONARY ANGIOGRAPHY N/A 05/10/2024   Procedure: RIGHT/LEFT HEART CATH AND CORONARY ANGIOGRAPHY;  Surgeon: Wonda Sharper, MD;  Location: [facility] INVASIVE CV LAB;  Service: Cardiovascular;  Laterality: N/A;   SENTINEL NODE BIOPSY Left 02/19/2024   Procedure: BIOPSY, LYMPH NODE;  Surgeon: Kerrin Elspeth BROCKS, MD;  Location: [facility] OR;  Service: Thoracic;  Laterality: Left;   SPINE SURGERY  09/14/2020   THUMB ARTHROSCOPY  2008   rt   TOTAL HIP ARTHROPLASTY Right 1985   VIDEO BRONCHOSCOPY N/A 02/23/2024   Procedure: VIDEO BRONCHOSCOPY WITHOUT FLUORO;  Surgeon: Kerrin Elspeth BROCKS, MD;  Location: [facility] ENDOSCOPY;  Service: Thoracic;  Laterality: N/A;  Bronch with IBV insertion   VIDEO BRONCHOSCOPY WITH INSERTION OF INTERBRONCHIAL VALVE (IBV) N/A 02/23/2024   Procedure: BRONCHOSCOPY, FLEXIBLE, WITH INTRABRONCHIAL VALVE INSERTION;  Surgeon: Kerrin Elspeth BROCKS, MD;  Location: [facility] ENDOSCOPY;  Service: Thoracic;  Laterality: N/A;    Social History   Socioeconomic History   Marital status: Widowed    Spouse name: Not on file   Number of children: Not on file   Years of education: Not on file   Highest education level: Associate degree: occupational, scientist, product/process development, or vocational program  Occupational History   Not on file  Tobacco Use   Smoking status: Former    Current packs/day: 0.00    Average packs/day: 0.5 packs/day for 50.3 years (25.2 ttl pk-yrs)    Types: Cigarettes    Start date: 82     Quit date: 12/11/2023    Years since quitting: 0.6   Smokeless tobacco: Never   Tobacco comments:    Started smoking at 68 years old.    Smoked 1 PPD at his heaviest.    Quit smoking on 12/11/2023  Vaping Use   Vaping status: Some Days  Substance and Sexual Activity   Alcohol use: Yes    Alcohol/week: 14.0 standard drinks of alcohol    Types: 14 Standard drinks or equivalent per week   Drug use: No   Sexual activity: Not Currently    Birth control/protection: None  Other Topics Concern   Not on file  Social History Narrative   Quality control Tech- gauges/callibrations.  (Retired)   Some  college/tech school   wife passed   3 grown children (oldest daughter is living with them) youngest daughter lives in [place].  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  Son lives near [place]- framing/art.   Social Drivers of Health   Tobacco Use: Medium Risk (07/24/2024)   Patient History    Smoking Tobacco Use: Former    Smokeless Tobacco Use: Never    Passive Exposure: Not on file  Financial Resource Strain: Low Risk (05/31/2024)   Overall Financial Resource Strain (CARDIA)    Difficulty of Paying Living Expenses: Not very hard  Food Insecurity: No Food Insecurity (07/12/2024)   Epic    Worried About Programme Researcher, Broadcasting/film/video in the Last Year: Never true    Ran Out of Food in the Last Year: Never true  Transportation Needs: No Transportation Needs (07/12/2024)   Epic    Lack of Transportation (Medical): No    Lack of Transportation (Non-Medical): No  Physical Activity: Insufficiently Active (05/31/2024)   Exercise Vital Sign    Days of Exercise per Week: 2 days    Minutes of Exercise per Session: 30 min  Stress: No Stress Concern Present (05/31/2024)   [employer] of Occupational Health - Occupational Stress Questionnaire    Feeling of Stress: Only a little  Social Connections: Moderately Integrated (07/09/2024)   Social Connection and Isolation Panel    Frequency of Communication with Friends  and Family: Three times a week    Frequency of Social Gatherings with Friends and Family: Once a week    Attends Religious Services: 1 to 4 times per year    Active Member of [employer] or Organizations: Yes    Attends Banker Meetings: 1 to 4 times per year    Marital Status: Widowed  Intimate Partner Violence: Not At Risk (07/12/2024)   Epic    Fear of Current or Ex-Partner: No    Emotionally Abused: No    Physically Abused: No    Sexually Abused: No  Depression (PHQ2-9): Low Risk (07/17/2024)   Depression (PHQ2-9)    PHQ-2 Score: 0  Alcohol Screen: Low Risk (05/31/2024)   Alcohol Screen    Last Alcohol Screening Score (AUDIT): 4  Housing: Unknown (07/12/2024)   Epic    Unable to Pay for Housing in the Last Year: No    Number of Times Moved in the Last Year: Not on file    Homeless in the Last Year: No  Utilities: Not At Risk (07/12/2024)   Epic    Threatened with loss of utilities: No  Health Literacy: Not on file     Allergies[2]   CBC    Component Value Date/Time   WBC 7.6 07/17/2024 1136   RBC 4.30 07/17/2024 1136   HGB 13.7 07/17/2024 1136   HGB 14.0 05/07/2024 1312   HCT 41.7 07/17/2024 1136   HCT 43.4 05/07/2024 1312   PLT 312.0 07/17/2024 1136   PLT 231 05/07/2024 1312   MCV 97.0 07/17/2024 1136   MCV 99 (H) 05/07/2024 1312   MCH 32.1 07/10/2024 0537   MCHC 32.9 07/17/2024 1136   RDW 13.3 07/17/2024 1136   RDW 12.9 05/07/2024 1312   LYMPHSABS 2.0 07/17/2024 1136   MONOABS 1.2 (H) 07/17/2024 1136   EOSABS 0.2 07/17/2024 1136   BASOSABS 0.1 07/17/2024 1136    Pulmonary Functions Testing Results:    Latest Ref Rng & Units 01/17/2024    9:26 AM  PFT Results  FVC-Pre L 4.99   FVC-Predicted Pre %  96   FVC-Post L 5.43   FVC-Predicted Post % 105   Pre FEV1/FVC % % 41   Post FEV1/FCV % % 42   FEV1-Pre L 2.03   FEV1-Predicted Pre % 53   FEV1-Post L 2.28   DLCO uncorrected ml/min/mmHg 12.00   DLCO UNC% % 40   DLCO corrected ml/min/mmHg 11.90    DLCO COR %Predicted % 40   DLVA Predicted % 34   TLC L 10.86   TLC % Predicted % 138   RV % Predicted % 190     Outpatient Medications Prior to Visit  Medication Sig Dispense Refill   acetaminophen  (TYLENOL ) 500 MG tablet Take 500 mg by mouth every 6 (six) hours as needed for moderate pain (pain score 4-6).     albuterol  (VENTOLIN  HFA) 108 (90 Base) MCG/ACT inhaler Inhale 2 puffs into the lungs every 6 (six) hours as needed for wheezing or shortness of breath. 6.7 g 2   apixaban  (ELIQUIS ) 5 MG TABS tablet Take 1 tablet (5 mg total) by mouth 2 (two) times daily. 180 tablet 3   aspirin  EC 81 MG tablet Take 1 tablet (81 mg total) by mouth daily. Swallow whole.     calcium  carbonate (TUMS EX) 750 MG chewable tablet Chew 1-2 tablets by mouth 2 (two) times daily as needed for heartburn.     Clotrimazole  1 % OINT Apply to affected area twice daily as needed 56.7 g 0   empagliflozin  (JARDIANCE ) 10 MG TABS tablet Take 1 tablet (10 mg total) by mouth daily before breakfast. 90 tablet 1   fluticasone -salmeterol (WIXELA INHUB) 100-50 MCG/ACT AEPB Inhale 1 puff into the lungs 2 (two) times daily. 60 each 11   furosemide  (LASIX ) 20 MG tablet Take 1 tablet (20 mg total) by mouth as directed. Take 20 mg daily and an additional 20 mg every other day (Patient taking differently: Take 20-40 mg by mouth in the morning. Take one tablet (20mg ) by mouth every other day.  On opposite days, take two tablets (40mg ) by mouth.) 135 tablet 3   ibuprofen  (ADVIL ) 200 MG tablet Take 200 mg by mouth every 6 (six) hours as needed for moderate pain (pain score 4-6).     loratadine  (CLARITIN ) 10 MG tablet Take 10 mg by mouth daily.     metoprolol  succinate (TOPROL  XL) 25 MG 24 hr tablet Take 1 tablet (25 mg total) by mouth daily. 90 tablet 3   Multiple Vitamins-Minerals (MULTIVITAMIN GUMMIES ADULT) CHEW Chew 8 each by mouth in the morning. Chew eight gummies by mouth daily in the morning.     Oxycodone  HCl 10 MG TABS Take 1  tablet by mouth every 6 (six) hours as needed.     OXYGEN  Inhale 4 L/min into the lungs continuous.     Spacer/Aero-Holding Chambers DEVI 1 each by Does not apply route 4 (four) times daily as needed. 1 each 0   STELARA 45 MG/0.5ML injection Inject 45 mg into the skin as directed. Every 12 weeks     Timolol  Maleate, Once-Daily, 0.5 % SOLN Place 1 drop into both eyes in the morning and at bedtime.     Travoprost, BAK Free, (TRAVATAN) 0.004 % SOLN ophthalmic solution Place 1 drop into both eyes in the morning.     doxycycline  (VIBRA -TABS) 100 MG tablet Take 1 tablet (100 mg total) by mouth every 12 (twelve) hours. (Patient not taking: Reported on 07/17/2024) 2 tablet 0   predniSONE  (DELTASONE ) 10 MG tablet Take 1 tablet (  10 mg total) by mouth daily with breakfast. (Patient not taking: Reported on 07/17/2024) 5 tablet 0   No facility-administered medications prior to visit.      [1]  Current Outpatient Medications:    acetaminophen  (TYLENOL ) 500 MG tablet, Take 500 mg by mouth every 6 (six) hours as needed for moderate pain (pain score 4-6)., Disp: , Rfl:    albuterol  (VENTOLIN  HFA) 108 (90 Base) MCG/ACT inhaler, Inhale 2 puffs into the lungs every 6 (six) hours as needed for wheezing or shortness of breath., Disp: 6.7 g, Rfl: 2   apixaban  (ELIQUIS ) 5 MG TABS tablet, Take 1 tablet (5 mg total) by mouth 2 (two) times daily., Disp: 180 tablet, Rfl: 3   aspirin  EC 81 MG tablet, Take 1 tablet (81 mg total) by mouth daily. Swallow whole., Disp: , Rfl:    calcium  carbonate (TUMS EX) 750 MG chewable tablet, Chew 1-2 tablets by mouth 2 (two) times daily as needed for heartburn., Disp: , Rfl:    Clotrimazole  1 % OINT, Apply to affected area twice daily as needed, Disp: 56.7 g, Rfl: 0   empagliflozin  (JARDIANCE ) 10 MG TABS tablet, Take 1 tablet (10 mg total) by mouth daily before breakfast., Disp: 90 tablet, Rfl: 1   fluticasone -salmeterol (WIXELA INHUB) 100-50 MCG/ACT AEPB, Inhale 1 puff into the lungs 2  (two) times daily., Disp: 60 each, Rfl: 11   furosemide  (LASIX ) 20 MG tablet, Take 1 tablet (20 mg total) by mouth as directed. Take 20 mg daily and an additional 20 mg every other day (Patient taking differently: Take 20-40 mg by mouth in the morning. Take one tablet (20mg ) by mouth every other day.  On opposite days, take two tablets (40mg ) by mouth.), Disp: 135 tablet, Rfl: 3   ibuprofen  (ADVIL ) 200 MG tablet, Take 200 mg by mouth every 6 (six) hours as needed for moderate pain (pain score 4-6)., Disp: , Rfl:    loratadine  (CLARITIN ) 10 MG tablet, Take 10 mg by mouth daily., Disp: , Rfl:    metoprolol  succinate (TOPROL  XL) 25 MG 24 hr tablet, Take 1 tablet (25 mg total) by mouth daily., Disp: 90 tablet, Rfl: 3   Multiple Vitamins-Minerals (MULTIVITAMIN GUMMIES ADULT) CHEW, Chew 8 each by mouth in the morning. Chew eight gummies by mouth daily in the morning., Disp: , Rfl:    Oxycodone  HCl 10 MG TABS, Take 1 tablet by mouth every 6 (six) hours as needed., Disp: , Rfl:    OXYGEN , Inhale 4 L/min into the lungs continuous., Disp: , Rfl:    Spacer/Aero-Holding Chambers DEVI, 1 each by Does not apply route 4 (four) times daily as needed., Disp: 1 each, Rfl: 0   STELARA 45 MG/0.5ML injection, Inject 45 mg into the skin as directed. Every 12 weeks, Disp: , Rfl:    Timolol  Maleate, Once-Daily, 0.5 % SOLN, Place 1 drop into both eyes in the morning and at bedtime., Disp: , Rfl:    Travoprost, BAK Free, (TRAVATAN) 0.004 % SOLN ophthalmic solution, Place 1 drop into both eyes in the morning., Disp: , Rfl:    doxycycline  (VIBRA -TABS) 100 MG tablet, Take 1 tablet (100 mg total) by mouth every 12 (twelve) hours. (Patient not taking: Reported on 07/17/2024), Disp: 2 tablet, Rfl: 0   predniSONE  (DELTASONE ) 10 MG tablet, Take 1 tablet (10 mg total) by mouth daily with breakfast. (Patient not taking: Reported on 07/17/2024), Disp: 5 tablet, Rfl: 0 [2] No Known Allergies

## 2024-07-25 ENCOUNTER — Other Ambulatory Visit: Payer: Self-pay

## 2024-07-25 NOTE — Transitions of Care (Post Inpatient/ED Visit) (Signed)
 Transition of Care week 3  Visit Note  07/25/2024  Name: Brett [name] MRN: [id number]          DOB: 05-Jan-1956  Situation: Patient enrolled in [facility] 30-day program. Visit completed with Bob Sills by telephone.   Background:   Initial Transition Care Management Follow-up Telephone Call Discharge Date and Diagnosis: 07/11/24, Parainfluenza   Past Medical History:  Diagnosis Date   Allergy    Arthritis    Asthma    Atrial fibrillation (HCC) 04/29/2024   Bilateral low back pain without sciatica 09/22/2015   Blood transfusion without reported diagnosis    Cancer (HCC)    Left Lower Lobe Adenocarcinoma   Cardiomyopathy (HCC)    Cervical neck pain with evidence of disc disease 09/22/2015   Chicken pox    Chronic CHF (congestive heart failure) (HCC) 03/26/2024   Congenital hip deformity    Corn of foot 08/09/2022   Degenerative disc disease, cervical 01/29/2015   Dysrhythmia    SVT   Eczema 05/30/2013   Erectile dysfunction 09/22/2015   Glaucoma 05/30/2013   History of cardiac dysrhythmia 05/23/2023   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 08/06/2023   Pain of left hip joint 11/01/2018   Paroxysmal SVT (supraventricular tachycardia) (HCC), incidental finding IntraOp back surgery requiring esmolol ; event monitor November 2024 for short runs, longest episode 17 beats. 06/19/2023   Event monitor November 2024, supraventricular ectopy burden 1.7%.  4 short runs of SVT, longest episode 17 beats.  No symptoms reported     Personal history of congenital hip dysplasia 05/30/2013   Preventative health care 01/29/2015   Rhinitis, allergic 09/22/2015   Rosacea 05/30/2013    Scoliosis deformity of spine 05/05/2020   Spinal stenosis of lumbar region 06/16/2023   Spondylolisthesis at L5-S1 level 09/14/2020   Tobacco abuse 05/30/2013    Assessment: Patient Reported Symptoms: Cognitive Cognitive Status: Alert and oriented to person, place, and time, Normal speech and language skills      Neurological Neurological Review of Symptoms: No symptoms reported    HEENT HEENT Symptoms Reported: No symptoms reported      Cardiovascular Cardiovascular Symptoms Reported: No symptoms reported Does patient have uncontrolled Hypertension?: No Cardiovascular Management Strategies: Medication therapy, Routine screening, Adequate rest Cardiovascular Comment: The patient continues with Diuretics  Respiratory Respiratory Symptoms Reported: Shortness of breath, Dry cough Additional Respiratory Details: Exertional Dyspnea and occasional Hypoxemia Respiratory Management Strategies: Oxygen  therapy, Pulmonary rehab, Routine screening, Medication therapy, Activity, Coping strategies Respiratory Self-Management Outcome: 3 (uncertain)  Endocrine Endocrine Symptoms Reported: No symptoms reported Is patient diabetic?: No    Gastrointestinal Gastrointestinal Symptoms Reported: No symptoms reported      Genitourinary Genitourinary Symptoms Reported: No symptoms reported    Integumentary Integumentary Symptoms Reported: Skin changes Additional Integumentary Details: Psoriasis Skin Management Strategies: Medication therapy, Routine screening  Musculoskeletal Musculoskelatal Symptoms Reviewed: Limited mobility Additional Musculoskeletal Details: SOB with exertion Musculoskeletal Management Strategies: Routine screening, Medical device, Exercise      Psychosocial Psychosocial Symptoms Reported: No symptoms reported         There were no vitals filed for this visit. Pain Scale: 0-10 Pain Score: 5  Pain Type: Chronic pain Pain Location: Back Pain Orientation: Mid Pain  Descriptors /  Indicators: Dull, Throbbing Pain Onset: On-going Patients Stated Pain Goal: 0 Pain Intervention(s): Medication (See eMAR)  Medications Reviewed Today     Reviewed by [name], RN (Case Manager) on 07/25/24 at 1138  Med List Status: <None>   Medication Order Taking? Sig Documenting Provider Last Dose Status Informant  acetaminophen  (TYLENOL ) 500 MG tablet [id number] Yes Take 500 mg by mouth every 6 (six) hours as needed for moderate pain (pain score 4-6). [provider]  Active Self, Pharmacy Records           Med Note LEOBARDO, NICOLE   Tue Jul 09, 2024  7:38 AM) Emily on opposite days from ibuprofen .   albuterol  (VENTOLIN  HFA) 108 (90 Base) MCG/ACT inhaler [id number] Yes Inhale 2 puffs into the lungs every 6 (six) hours as needed for wheezing or shortness of breath. Samtani, Jai-Gurmukh, MD  Active   apixaban  (ELIQUIS ) 5 MG TABS tablet [id number] Yes Take 1 tablet (5 mg total) by mouth 2 (two) times daily. MadireddyAlean [name], MD  Active Self, Pharmacy Records  aspirin  EC 81 MG tablet [id number] Yes Take 1 tablet (81 mg total) by mouth daily. Swallow whole. [name], [name], MD  Active Self, Pharmacy Records  calcium  carbonate (TUMS EX) 750 MG chewable tablet [id number] Yes Chew 1-2 tablets by mouth 2 (two) times daily as needed for heartburn. [provider]  Active Self, Pharmacy Records  Clotrimazole  1 % OINT [id number] Yes Apply to affected area twice daily as needed [name], NP  Active Self, Pharmacy Records  doxycycline  (VIBRA -TABS) 100 MG tablet [id number]  Take 1 tablet (100 mg total) by mouth every 12 (twelve) hours.  Patient not taking: Reported on 07/17/2024   Samtani, Jai-Gurmukh, MD  Active   empagliflozin  (JARDIANCE ) 10 MG TABS tablet [id number] Yes Take 1 tablet (10 mg total) by mouth daily before breakfast. [name], [name], MD  Active Self, Pharmacy Records  fluticasone -salmeterol [facility] INHUB) 100-50 MCG/ACT AEPB  [id number] Yes Inhale 1 puff into the lungs 2 (two) times daily. Isadora Hose, MD  Active Self, Pharmacy Records  furosemide  (LASIX ) 20 MG tablet [id number] Yes Take 1 tablet (20 mg total) by mouth as directed. Take 20 mg daily and an additional 20 mg every other day  Patient taking differently: Take 20-40 mg by mouth in the morning. Take one tablet (20mg ) by mouth every other day.  On opposite days, take two tablets (40mg ) by mouth.   MadireddyAlean [name], MD  Active Self, Pharmacy Records           Med Note LEOBARDO, NICOLE   Tue Jul 09, 2024  7:37 AM) Two tablets taken on 07/08/24  ibuprofen  (ADVIL ) 200 MG tablet [id number] Yes Take 200 mg by mouth every 6 (six) hours as needed for moderate pain (pain score 4-6). [provider]  Active Self, Pharmacy Records           Med Note LEOBARDO, NICOLE   Tue Jul 09, 2024  7:38 AM) Emily on opposite days from acetaminophen    loratadine  (CLARITIN ) 10 MG tablet [id number] Yes Take 10 mg by mouth daily. [provider]  Active Self, Pharmacy Records  metoprolol  succinate (TOPROL  XL) 25 MG 24 hr tablet [id number] Yes Take 1 tablet (25 mg total) by mouth daily. [name], [name], MD  Active Self, Pharmacy Records  Multiple Vitamins-Minerals (MULTIVITAMIN GUMMIES ADULT) CHEW [id number] Yes Chew 8 each by mouth in the morning. Chew eight gummies by mouth daily in the morning. [provider]  Active Self, Pharmacy Records  Oxycodone  HCl 10 MG TABS [id number] Yes Take 1 tablet by mouth every 6 (six) hours as needed. [provider]  Active Self, Pharmacy Records  OXYGEN  [id number] Yes Inhale 4 L/min into the lungs continuous. [provider]  Active Self, Pharmacy Records  predniSONE  (DELTASONE ) 10 MG tablet [id number]  Take 1 tablet (10 mg total) by mouth daily with breakfast.  Patient not taking: Reported on 07/17/2024   Samtani, Jai-Gurmukh, MD  Active   Spacer/Aero-Holding Chambers DEVI [id number] Yes 1 each by Does not apply  route 4 (four) times daily as needed. Samtani, Jai-Gurmukh, MD  Active   STELARA 45 MG/0.5ML injection [id number] Yes Inject 45 mg into the skin as directed. Every 12 weeks [provider]  Active Self, Pharmacy Records           Med Note (GARNER, TIFFANY L   Mon Apr 29, 2024 10:05 AM)    Timolol  Maleate, Once-Daily, 0.5 % SOLN [id number] Yes Place 1 drop into both eyes in the morning and at bedtime. [provider]  Active Self, Pharmacy Records  Travoprost, BAK Free, (TRAVATAN) 0.004 % SOLN ophthalmic solution [id number] Yes Place 1 drop into both eyes in the morning. [provider]  Active Self, Pharmacy Records            Recommendation:   Continue Current Plan of Care  Follow Up Plan:   Telephone follow-up in 1 week  Medford Balboa, BSN, RN [place]  VBCI - [facility] RN Care Manager [phone]

## 2024-07-25 NOTE — Patient Instructions (Signed)
 Visit Information  Thank you for taking time to visit with me today. Please don't hesitate to contact me if I can be of assistance to you before our next scheduled telephone appointment.  Our next appointment is by telephone on Monday December 29th at 10:30am  Following is a copy of your care plan:   Goals Addressed             This Visit's Progress    VBCI Transitions of Care (TOC) Care Plan       Problems: (reviewed 07/25/24) Recent Hospitalization for treatment of COPD exacerbation due to [facility] or ED Adm Risk 90%  Goal: (reviewed 07/25/24) Over the next 30 days, the patient will not experience hospital readmission  Interventions: (reviewed 07/25/24)  COPD Interventions: Advised patient to engage in light exercise as tolerated 3-5 days a week to aid in the the management of COPD Advised patient to track and manage COPD triggers Discussed Pulmonary Rehab and offered to assist with referral placement Discussed the importance of adequate rest and management of fatigue with COPD Provided education about and advised patient to utilize infection prevention strategies to reduce risk of respiratory infection Provided instruction about proper use of medications used for management of COPD including inhalers Screening for signs and symptoms of depression related to chronic disease state  Use of home oxygen  Education on the use of a spacer for medication administration Oxygen  at 4l/min continuous Participate in Pulmonary Rehab as directed by the provider  Patient Self Care Activities: (reviewed 07/25/24) Attend all scheduled provider appointments Call pharmacy for medication refills 3-7 days in advance of running out of medications Call provider office for new concerns or questions  Notify RN Care Manager of [facility] call rescheduling needs Participate in Transition of Care Program/Attend [facility] scheduled calls Perform all self care activities independently  Take  medications as prescribed    Plan:  Telephone follow up appointment with care management team member scheduled for:  Monday December 29th at 10:30am        Patient verbalizes understanding of instructions and care plan provided today and agrees to view in MyChart. Active MyChart status and patient understanding of how to access instructions and care plan via MyChart confirmed with patient.     The patient has been provided with contact information for the care management team and has been advised to call with any health related questions or concerns.   Please call the care guide team at [phone] if you need to cancel or reschedule your appointment.   Please call the Suicide and Crisis Lifeline: 988 call the [place]  National Suicide Prevention Lifeline: (941)623-6389 or TTY: 747-274-3431 TTY (820)359-5163) to talk to a trained counselor if you are experiencing a Mental Health or Behavioral Health Crisis or need someone to talk to.  Medford Balboa, BSN, RN [place]  VBCI - [employer] [facility] RN Care Manager [phone]

## 2024-07-30 ENCOUNTER — Other Ambulatory Visit (HOSPITAL_COMMUNITY): Payer: Self-pay

## 2024-08-02 DIAGNOSIS — J449 Chronic obstructive pulmonary disease, unspecified: Secondary | ICD-10-CM | POA: Diagnosis not present

## 2024-08-02 DIAGNOSIS — Z902 Acquired absence of lung [part of]: Secondary | ICD-10-CM | POA: Diagnosis not present

## 2024-08-05 ENCOUNTER — Telehealth: Payer: Self-pay

## 2024-08-06 ENCOUNTER — Other Ambulatory Visit: Payer: Self-pay

## 2024-08-06 NOTE — Patient Instructions (Signed)
 Visit Information  Thank you for taking time to visit with me today. Please don't hesitate to contact me if I can be of assistance to you before our next scheduled telephone appointment.  Our next appointment is by telephone on January 7th at 10:00am  Following is a copy of your care plan:   Goals Addressed             This Visit's Progress    VBCI Transitions of Care (TOC) Care Plan       Problems: (reviewed 08/06/24) Recent Hospitalization for treatment of COPD exacerbation due to [facility] or ED Adm Risk 90%  Goal: (reviewed 08/06/24) Over the next 30 days, the patient will not experience hospital readmission  Interventions: (reviewed 08/06/24)  COPD Interventions: Advised patient to engage in light exercise as tolerated 3-5 days a week to aid in the the management of COPD Advised patient to track and manage COPD triggers Discussed Pulmonary Rehab and offered to assist with referral placement Discussed the importance of adequate rest and management of fatigue with COPD Provided education about and advised patient to utilize infection prevention strategies to reduce risk of respiratory infection Provided instruction about proper use of medications used for management of COPD including inhalers Screening for signs and symptoms of depression related to chronic disease state  Use of home oxygen  Education on the use of a spacer for medication administration Oxygen  at 4l/min continuous Participate in Pulmonary Rehab as directed by the provider  Patient Self Care Activities: (reviewed 08/06/24) Attend all scheduled provider appointments Call pharmacy for medication refills 3-7 days in advance of running out of medications Call provider office for new concerns or questions  Notify RN Care Manager of [facility] call rescheduling needs Participate in Transition of Care Program/Attend [facility] scheduled calls Perform all self care activities independently  Take medications as  prescribed    Plan:  Telephone follow up appointment with care management team member scheduled for:  Wednesday January 7th at 10:00amm        Patient verbalizes understanding of instructions and care plan provided today and agrees to view in MyChart. Active MyChart status and patient understanding of how to access instructions and care plan via MyChart confirmed with patient.     The patient has been provided with contact information for the care management team and has been advised to call with any health related questions or concerns.   Please call the care guide team at [phone] if you need to cancel or reschedule your appointment.   Please call the Suicide and Crisis Lifeline: 988 call the [place]  National Suicide Prevention Lifeline: (567)787-2304 or TTY: 571-653-1444 TTY 440-311-5367) to talk to a trained counselor if you are experiencing a Mental Health or Behavioral Health Crisis or need someone to talk to.  Medford Balboa, BSN, RN [place]  VBCI - [employer] [facility] RN Care Manager [phone]

## 2024-08-06 NOTE — Transitions of Care (Post Inpatient/ED Visit) (Signed)
 " Transition of Care week 4  Visit Note  08/06/2024  Name: Brett [name] MRN: [id number]          DOB: 06-22-1956  Situation: Patient enrolled in [facility] 30-day program. Visit completed with Bob Medal by telephone.   Background:   Initial Transition Care Management Follow-up Telephone Call Discharge Date and Diagnosis: 07/11/24, Parainfluenza   Past Medical History:  Diagnosis Date   Allergy    Arthritis    Asthma    Atrial fibrillation (HCC) 04/29/2024   Bilateral low back pain without sciatica 09/22/2015   Blood transfusion without reported diagnosis    Cancer (HCC)    Left Lower Lobe Adenocarcinoma   Cardiomyopathy (HCC)    Cervical neck pain with evidence of disc disease 09/22/2015   Chicken pox    Chronic CHF (congestive heart failure) (HCC) 03/26/2024   Congenital hip deformity    Corn of foot 08/09/2022   Degenerative disc disease, cervical 01/29/2015   Dysrhythmia    SVT   Eczema 05/30/2013   Erectile dysfunction 09/22/2015   Glaucoma 05/30/2013   History of cardiac dysrhythmia 05/23/2023   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 08/06/2023   Pain of left hip joint 11/01/2018   Paroxysmal SVT (supraventricular tachycardia) (HCC), incidental finding IntraOp back surgery requiring esmolol ; event monitor November 2024 for short runs, longest episode 17 beats. 06/19/2023   Event monitor November 2024, supraventricular ectopy burden 1.7%.  4 short runs of SVT, longest episode 17 beats.  No symptoms reported     Personal history of congenital hip dysplasia 05/30/2013   Preventative health care 01/29/2015   Rhinitis, allergic 09/22/2015   Rosacea 05/30/2013    Scoliosis deformity of spine 05/05/2020   Spinal stenosis of lumbar region 06/16/2023   Spondylolisthesis at L5-S1 level 09/14/2020   Tobacco abuse 05/30/2013    Assessment: Patient Reported Symptoms: Cognitive Cognitive Status: Alert and oriented to person, place, and time, Normal speech and language skills      Neurological Neurological Review of Symptoms: No symptoms reported    HEENT HEENT Symptoms Reported: No symptoms reported      Cardiovascular Cardiovascular Symptoms Reported: No symptoms reported    Respiratory Respiratory Symptoms Reported: Shortness of breath Additional Respiratory Details: SOB with exertion. States he has to rest 10 minutes and then he is okay Respiratory Management Strategies: Oxygen  therapy, Pulmonary rehab, Routine screening, Medication therapy, Activity, Coping strategies  Endocrine Endocrine Symptoms Reported: No symptoms reported Is patient diabetic?: No    Gastrointestinal Gastrointestinal Symptoms Reported: No symptoms reported      Genitourinary Genitourinary Symptoms Reported: No symptoms reported    Integumentary Integumentary Symptoms Reported: Rash Additional Integumentary Details: Psoriasis Skin Management Strategies: Medication therapy, Routine screening  Musculoskeletal Musculoskelatal Symptoms Reviewed: Back pain Additional Musculoskeletal Details: SOB with exertion Musculoskeletal Management Strategies: Routine screening, Medical device, Exercise      Psychosocial Psychosocial Symptoms Reported: No symptoms reported         There were no vitals filed for this visit. Pain Score: 5  Pain Type: Chronic pain Pain Location: Back Pain Orientation: Mid Pain Descriptors / Indicators: Throbbing, Spasm Pain Onset: On-going Pain Intervention(s): Medication (See eMAR)  Medications Reviewed Today     Reviewed by  Mory Herrman, Wanda, CHARITY FUNDRAISER (Case Production Designer, Theatre/television/film) on 08/06/24 at 1031  Med List Status: <None>   Medication Order Taking? Sig  Documenting Provider Last Dose Status Informant  acetaminophen  (TYLENOL ) 500 MG tablet [id number]  Take 500 mg by mouth every 6 (six) hours as needed for moderate pain (pain score 4-6). [provider]  Active Self, Pharmacy Records           Med Note LEOBARDO, NICOLE   Tue Jul 09, 2024  7:38 AM) Emily on opposite days from ibuprofen .   albuterol  (VENTOLIN  HFA) 108 (90 Base) MCG/ACT inhaler [id number]  Inhale 2 puffs into the lungs every 6 (six) hours as needed for wheezing or shortness of breath. Samtani, Jai-Gurmukh, MD  Active   apixaban  (ELIQUIS ) 5 MG TABS tablet [id number]  Take 1 tablet (5 mg total) by mouth 2 (two) times daily. MadireddyAlean [name], MD  Active Self, Pharmacy Records  aspirin  EC 81 MG tablet [id number]  Take 1 tablet (81 mg total) by mouth daily. Swallow whole. [name], [name], MD  Active Self, Pharmacy Records  calcium  carbonate (TUMS EX) 750 MG chewable tablet [id number]  Chew 1-2 tablets by mouth 2 (two) times daily as needed for heartburn. [provider]  Active Self, Pharmacy Records  Clotrimazole  1 % OINT [id number]  Apply to affected area twice daily as needed [name], NP  Active Self, Pharmacy Records  doxycycline  (VIBRA -TABS) 100 MG tablet [id number]  Take 1 tablet (100 mg total) by mouth every 12 (twelve) hours.  Patient not taking: Reported on 07/17/2024   Samtani, Jai-Gurmukh, MD  Active   empagliflozin  (JARDIANCE ) 10 MG TABS tablet [id number]  Take 1 tablet (10 mg total) by mouth daily before breakfast. [name], [name], MD  Active Self, Pharmacy Records  fluticasone -salmeterol (WIXELA INHUB) 100-50 MCG/ACT AEPB [id number]  Inhale 1 puff into the lungs 2 (two) times daily. Isadora Hose, MD  Active Self, Pharmacy Records  furosemide  (LASIX ) 20 MG tablet [id number]  Take 1 tablet (20 mg total) by mouth as directed. Take 20 mg daily and an additional 20 mg every other day  Patient taking differently: Take 20-40 mg by mouth in the morning.  Take one tablet (20mg ) by mouth every other day.  On opposite days, take two tablets (40mg ) by mouth.   MadireddyAlean [name], MD  Expired 07/28/24 2359 Self, Pharmacy Records           Med Note (LEE, NICOLE   Tue Jul 09, 2024  7:37 AM) Two tablets taken on 07/08/24  ibuprofen  (ADVIL ) 200 MG tablet [id number]  Take 200 mg by mouth every 6 (six) hours as needed for moderate pain (pain score 4-6). [provider]  Active Self, Pharmacy Records           Med Note LEOBARDO, NICOLE   Tue Jul 09, 2024  7:38 AM) Emily on opposite days from acetaminophen    loratadine  (CLARITIN ) 10 MG tablet [id number]  Take 10 mg by mouth daily. [provider]  Active Self, Pharmacy Records  metoprolol  succinate (TOPROL  XL) 25 MG 24 hr tablet [id number]  Take 1 tablet (25 mg total) by mouth daily. [name], [name], MD  Active Self, Pharmacy Records  Multiple Vitamins-Minerals (MULTIVITAMIN GUMMIES ADULT) CHEW [id number]  Chew 8 each by mouth in the morning. Chew eight gummies by mouth daily in the morning. [provider]  Active Self, Pharmacy Records  Oxycodone  HCl 10 MG TABS [id number]  Take 1 tablet by mouth every 6 (six) hours as needed.  [provider]  Active Self, Pharmacy Records  OXYGEN  [id number]  Inhale 4 L/min into the lungs continuous. [provider]  Active Self, Pharmacy Records  predniSONE  (DELTASONE ) 10 MG tablet [id number]  Take 1 tablet (10 mg total) by mouth daily with breakfast.  Patient not taking: Reported on 07/17/2024   Samtani, Jai-Gurmukh, MD  Active   Spacer/Aero-Holding Chambers DEVI [id number]  1 each by Does not apply route 4 (four) times daily as needed. Samtani, Jai-Gurmukh, MD  Active   STELARA 45 MG/0.5ML injection [id number]  Inject 45 mg into the skin as directed. Every 12 weeks [provider]  Active Self, Pharmacy Records           Med Note (GARNER, TIFFANY L   Mon Apr 29, 2024 10:05 AM)    Timolol  Maleate, Once-Daily, 0.5 % SOLN  [id number]  Place 1 drop into both eyes in the morning and at bedtime. [provider]  Active Self, Pharmacy Records  Travoprost, BAK Free, (TRAVATAN) 0.004 % SOLN ophthalmic solution [id number]  Place 1 drop into both eyes in the morning. [provider]  Active Self, Pharmacy Records            Recommendation:   Continue Current Plan of Care  Follow Up Plan:   Telephone follow-up in 1 week  Medford Balboa, BSN, RN [place]  VBCI - [facility] RN Care Manager [phone]     "

## 2024-08-14 ENCOUNTER — Other Ambulatory Visit: Payer: Self-pay

## 2024-08-14 NOTE — Patient Instructions (Signed)
 Visit Information  Thank you for taking time to visit with me today. Please don't hesitate to contact me if I can be of assistance to you before our next scheduled telephone appointment.  Our next appointment is by telephone on Wednesday at January 14th at 10:00am  Following is a copy of your care plan:   Goals Addressed             This Visit's Progress    VBCI Transitions of Care (TOC) Care Plan       Problems: (reviewed 08/14/2024) Recent Hospitalization for treatment of COPD exacerbation due to [facility] or ED Adm Risk 90%  Goal: (reviewed 08/14/2024) Over the next 30 days, the patient will not experience hospital readmission  Interventions: (reviewed 08/14/2024)  COPD Interventions: Advised patient to engage in light exercise as tolerated 3-5 days a week to aid in the the management of COPD Advised patient to track and manage COPD triggers Discussed Pulmonary Rehab and offered to assist with referral placement Discussed the importance of adequate rest and management of fatigue with COPD Provided education about and advised patient to utilize infection prevention strategies to reduce risk of respiratory infection Provided instruction about proper use of medications used for management of COPD including inhalers Screening for signs and symptoms of depression related to chronic disease state  Use of home oxygen  Education on the use of a spacer for medication administration Oxygen  at 4l/min continuous Participate in Pulmonary Rehab as directed by the provider  Patient Self Care Activities: (reviewed 08/06/24) Attend all scheduled provider appointments Call pharmacy for medication refills 3-7 days in advance of running out of medications Call provider office for new concerns or questions  Notify RN Care Manager of [facility] call rescheduling needs Participate in Transition of Care Program/Attend [facility] scheduled calls Perform all self care activities independently  Take  medications as prescribed    Plan:  Telephone follow up appointment with care management team member scheduled for:  Wednesday January 14th at 10:00amm        Patient verbalizes understanding of instructions and care plan provided today and agrees to view in MyChart. Active MyChart status and patient understanding of how to access instructions and care plan via MyChart confirmed with patient.     The patient has been provided with contact information for the care management team and has been advised to call with any health related questions or concerns.   Please call the care guide team at [phone] if you need to cancel or reschedule your appointment.   Please call the Suicide and Crisis Lifeline: 988 call the [place]  National Suicide Prevention Lifeline: 801-778-1744 or TTY: 418 638 9353 TTY [phone]) to talk to a trained counselor if you are experiencing a Mental Health or Behavioral Health Crisis or need someone to talk to.  Medford Balboa, BSN, RN [place]  VBCI - [employer] [facility] RN Care Manager [phone]

## 2024-08-14 NOTE — Transitions of Care (Post Inpatient/ED Visit) (Signed)
 " Transition of Care Week 5  Visit Note  08/14/2024  Name: Brett [name] MRN: [id number]          DOB: 16-Feb-1956  Situation: Patient enrolled in [facility] 30-day program. Visit completed with Bob Kozuch by telephone.   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 08/06/2023   Pain of left hip joint 11/01/2018   Paroxysmal SVT (supraventricular tachycardia) (HCC), incidental finding IntraOp back surgery requiring esmolol ; event monitor November 2024 for short runs, longest episode 17 beats. 06/19/2023   Event monitor November 2024, supraventricular ectopy burden 1.7%.  4 short runs of SVT, longest episode 17 beats.  No symptoms reported     Personal history of congenital hip dysplasia 05/30/2013   Preventative health care 01/29/2015   Rhinitis, allergic 09/22/2015   Rosacea 05/30/2013    Scoliosis deformity of spine 05/05/2020   Spinal stenosis of lumbar region 06/16/2023   Spondylolisthesis at L5-S1 level 09/14/2020   Tobacco abuse 05/30/2013    Assessment: Patient Reported Symptoms: Cognitive Cognitive Status: Alert and oriented to person, place, and time, Normal speech and language skills      Neurological Neurological Review of Symptoms: No symptoms reported    HEENT HEENT Symptoms Reported: No symptoms reported HEENT Comment: Has to have a biopsy of Salivary gland    Cardiovascular Cardiovascular Symptoms Reported: No symptoms reported Does patient have uncontrolled Hypertension?: No Cardiovascular Management Strategies: Medication therapy, Routine screening, Adequate rest  Respiratory Respiratory Symptoms Reported: Shortness of breath, Chest tightness Additional Respiratory Details: SOB on exertion. The patient wears oxygen  continuousHe requires frequent rest breaks. Respiratory Management Strategies: Oxygen  therapy, Pulmonary rehab, Routine screening, Medication therapy, Coping strategies, Adequate rest  Endocrine Endocrine Symptoms Reported: No symptoms reported Is patient diabetic?: No    Gastrointestinal Gastrointestinal Symptoms Reported: No symptoms reported      Genitourinary Genitourinary Symptoms Reported: No symptoms reported    Integumentary Integumentary Symptoms Reported: Not assessed    Musculoskeletal Musculoskelatal Symptoms Reviewed: Back pain Additional Musculoskeletal Details: History of spinal fusion Musculoskeletal Management Strategies: Medication therapy, Routine screening, Adequate rest, Activity      Psychosocial Psychosocial Symptoms Reported: No symptoms reported         There were no vitals filed for this visit. Pain Scale: 0-10 Pain Score: 6  Pain Type: Chronic pain Pain Location: Back Pain Orientation: Lower, Mid Pain Descriptors /  Indicators: Aching, Discomfort, Pressure Pain Onset: On-going Patients Stated Pain  Goal: 0 Pain Intervention(s): Medication (See eMAR), Rest  Medications Reviewed Today     Reviewed by [name], RN (Case Manager) on 08/14/24 at 1008  Med List Status: <None>   Medication Order Taking? Sig Documenting Provider Last Dose Status Informant  acetaminophen  (TYLENOL ) 500 MG tablet [id number]  Take 500 mg by mouth every 6 (six) hours as needed for moderate pain (pain score 4-6). [provider]  Active Self, Pharmacy Records           Med Note LEOBARDO, NICOLE   Tue Jul 09, 2024  7:38 AM) Emily on opposite days from ibuprofen .   albuterol  (VENTOLIN  HFA) 108 (90 Base) MCG/ACT inhaler [id number]  Inhale 2 puffs into the lungs every 6 (six) hours as needed for wheezing or shortness of breath. Samtani, Jai-Gurmukh, MD  Active   apixaban  (ELIQUIS ) 5 MG TABS tablet [id number]  Take 1 tablet (5 mg total) by mouth 2 (two) times daily. MadireddyAlean [name], MD  Active Self, Pharmacy Records  aspirin  EC 81 MG tablet [id number]  Take 1 tablet (81 mg total) by mouth daily. Swallow whole. [name], [name], MD  Active Self, Pharmacy Records  calcium  carbonate (TUMS EX) 750 MG chewable tablet [id number]  Chew 1-2 tablets by mouth 2 (two) times daily as needed for heartburn. [provider]  Active Self, Pharmacy Records  Clotrimazole  1 % OINT [id number]  Apply to affected area twice daily as needed [name], NP  Active Self, Pharmacy Records  doxycycline  (VIBRA -TABS) 100 MG tablet [id number]  Take 1 tablet (100 mg total) by mouth every 12 (twelve) hours.  Patient not taking: Reported on 07/17/2024   Samtani, Jai-Gurmukh, MD  Active   empagliflozin  (JARDIANCE ) 10 MG TABS tablet [id number]  Take 1 tablet (10 mg total) by mouth daily before breakfast. [name], [name], MD  Active Self, Pharmacy Records  fluticasone -salmeterol [facility] INHUB) 100-50 MCG/ACT AEPB [id number]  Inhale 1 puff into the lungs 2 (two) times daily. Isadora Hose, MD  Active Self, Pharmacy  Records  furosemide  (LASIX ) 20 MG tablet [id number]  Take 1 tablet (20 mg total) by mouth as directed. Take 20 mg daily and an additional 20 mg every other day  Patient taking differently: Take 20-40 mg by mouth in the morning. Take one tablet (20mg ) by mouth every other day.  On opposite days, take two tablets (40mg ) by mouth.   MadireddyAlean [name], MD  Expired 07/28/24 2359 Self, Pharmacy Records           Med Note (LEE, NICOLE   Tue Jul 09, 2024  7:37 AM) Two tablets taken on 07/08/24  ibuprofen  (ADVIL ) 200 MG tablet [id number]  Take 200 mg by mouth every 6 (six) hours as needed for moderate pain (pain score 4-6). [provider]  Active Self, Pharmacy Records           Med Note LEOBARDO, NICOLE   Tue Jul 09, 2024  7:38 AM) Emily on opposite days from acetaminophen    loratadine  (CLARITIN ) 10 MG tablet [id number]  Take 10 mg by mouth daily. [provider]  Active Self, Pharmacy Records  metoprolol  succinate (TOPROL  XL) 25 MG 24 hr tablet [id number]  Take 1 tablet (25 mg total) by mouth daily. [name], [name], MD  Active Self, Pharmacy Records  Multiple Vitamins-Minerals (MULTIVITAMIN GUMMIES ADULT) CHEW [id number]  Chew 8 each by mouth in the morning. Chew eight gummies by mouth daily in the  morning. [provider]  Active Self, Pharmacy Records  Oxycodone  HCl 10 MG TABS [id number]  Take 1 tablet by mouth every 6 (six) hours as needed. [provider]  Active Self, Pharmacy Records  OXYGEN  [id number]  Inhale 4 L/min into the lungs continuous. [provider]  Active Self, Pharmacy Records  predniSONE  (DELTASONE ) 10 MG tablet [id number]  Take 1 tablet (10 mg total) by mouth daily with breakfast.  Patient not taking: Reported on 07/17/2024   Samtani, Jai-Gurmukh, MD  Active   Spacer/Aero-Holding Chambers DEVI [id number]  1 each by Does not apply route 4 (four) times daily as needed. Samtani, Jai-Gurmukh, MD  Active   STELARA 45 MG/0.5ML injection [id number]   Inject 45 mg into the skin as directed. Every 12 weeks [provider]  Active Self, Pharmacy Records           Med Note (GARNER, TIFFANY L   Mon Apr 29, 2024 10:05 AM)    Timolol  Maleate, Once-Daily, 0.5 % SOLN [id number]  Place 1 drop into both eyes in the morning and at bedtime. [provider]  Active Self, Pharmacy Records  Travoprost, BAK Free, (TRAVATAN) 0.004 % SOLN ophthalmic solution [id number]  Place 1 drop into both eyes in the morning. [provider]  Active Self, Pharmacy Records            Recommendation:   Continue Current Plan of Care. Discharge from [facility] program next week   Follow Up Plan:   Telephone follow-up in 1 week  Medford Balboa, BSN, RN [place]  VBCI - [facility] RN Care Manager [phone]     "

## 2024-08-21 ENCOUNTER — Other Ambulatory Visit: Payer: Self-pay

## 2024-08-21 NOTE — Patient Instructions (Signed)
 Visit Information  Thank you for taking time to visit with me today. Please don't hesitate to contact me if I can be of assistance to you before our next scheduled telephone appointment.   Following is a copy of your care plan:   Goals Addressed             This Visit's Progress    COMPLETED: VBCI Transitions of Care (TOC) Care Plan       Problems: (reviewed 08/20/2024) Recent Hospitalization for treatment of COPD exacerbation due to [facility] or ED Adm Risk 90%  Goal: (reviewed 08/21/2024) Over the next 30 days, the patient will not experience hospital readmission  Interventions: (reviewed 08/21/2024)  COPD Interventions: Advised patient to engage in light exercise as tolerated 3-5 days a week to aid in the the management of COPD Advised patient to track and manage COPD triggers Discussed Pulmonary Rehab and offered to assist with referral placement Discussed the importance of adequate rest and management of fatigue with COPD Provided education about and advised patient to utilize infection prevention strategies to reduce risk of respiratory infection Provided instruction about proper use of medications used for management of COPD including inhalers Screening for signs and symptoms of depression related to chronic disease state  Use of home oxygen  Education on the use of a spacer for medication administration Oxygen  at 4l/min continuous Participate in Pulmonary Rehab as directed by the provider  Patient Self Care Activities: (reviewed 08/22/23) Attend all scheduled provider appointments Call pharmacy for medication refills 3-7 days in advance of running out of medications Call provider office for new concerns or questions  Notify RN Care Manager of TOC call rescheduling needs Participate in Transition of Care Program/Attend TOC scheduled calls Perform all self care activities independently  Take medications as prescribed    Plan:  Telephone follow up  appointment with care management team member scheduled for:  The patient has met his goals. The patient has been discharged from the [facility] Program        Patient verbalizes understanding of instructions and care plan provided today and agrees to view in MyChart. Active MyChart status and patient understanding of how to access instructions and care plan via MyChart confirmed with patient.     The patient has been provided with contact information for the care management team and has been advised to call with any health related questions or concerns.   Please call the care guide team at [phone] if you need to cancel or reschedule your appointment.   Please call the Suicide and Crisis Lifeline: 988 call the [place]  National Suicide Prevention Lifeline: 661-491-5602 or TTY: 229-329-1076 TTY [phone]) to talk to a trained counselor if you are experiencing a Mental Health or Behavioral Health Crisis or need someone to talk to.  Medford Balboa, BSN, RN [place]  VBCI - [employer] [facility] RN Care Manager [phone]

## 2024-08-21 NOTE — Transitions of Care (Post Inpatient/ED Visit) (Signed)
 " Transition of Care week 6  Visit Note  08/21/2024  Name: Brett [name] MRN: [id number]          DOB: 18-May-1956  Situation: Patient enrolled in [facility] 30-day program. Visit completed with Bob Segall by telephone.   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 08/06/2023   Pain of left hip joint 11/01/2018   Paroxysmal SVT (supraventricular tachycardia) (HCC), incidental finding IntraOp back surgery requiring esmolol ; event monitor November 2024 for short runs, longest episode 17 beats. 06/19/2023   Event monitor November 2024, supraventricular ectopy burden 1.7%.  4 short runs of SVT, longest episode 17 beats.  No symptoms reported     Personal history of congenital hip dysplasia 05/30/2013   Preventative health care 01/29/2015   Rhinitis, allergic 09/22/2015   Rosacea 05/30/2013    Scoliosis deformity of spine 05/05/2020   Spinal stenosis of lumbar region 06/16/2023   Spondylolisthesis at L5-S1 level 09/14/2020   Tobacco abuse 05/30/2013    Assessment: Patient Reported Symptoms: Cognitive Cognitive Status: Alert and oriented to person, place, and time, Normal speech and language skills      Neurological Neurological Review of Symptoms: No symptoms reported    HEENT HEENT Symptoms Reported: No symptoms reported      Cardiovascular Cardiovascular Symptoms Reported: No symptoms reported    Respiratory Respiratory Symptoms Reported: Shortness of breath, Chest tightness Additional Respiratory Details: SOB on exertion. The patient wears oxygen  continuous. He requires frequent rest breaks Respiratory Management Strategies: Oxygen  therapy, Pulmonary rehab, Routine screening, Coping strategies, Medication therapy, Adequate rest  Endocrine Endocrine Symptoms Reported: No symptoms reported Is patient diabetic?: No    Gastrointestinal Gastrointestinal Symptoms Reported: No symptoms reported      Genitourinary Genitourinary Symptoms Reported: No symptoms reported    Integumentary Integumentary Symptoms Reported: No symptoms reported    Musculoskeletal Musculoskelatal Symptoms Reviewed: Back pain Musculoskeletal Management Strategies: Medication therapy, Routine screening, Adequate rest      Psychosocial Psychosocial Symptoms Reported: No symptoms reported         There were no vitals filed for this visit. Pain Score: 6  Pain Type: Chronic pain Pain Location: Back Pain Orientation: Lower, Mid Pain Descriptors / Indicators: Aching, Discomfort Pain Onset: On-going Patients Stated Pain Goal: 0 Pain Intervention(s): Medication (See eMAR)  Medications Reviewed Today     Reviewed by [name], RN (Case  Manager) on 08/21/24 at 0953  Med List Status: <None>   Medication Order Taking? Sig Documenting Provider Last Dose Status Informant  acetaminophen   (TYLENOL ) 500 MG tablet [id number]  Take 500 mg by mouth every 6 (six) hours as needed for moderate pain (pain score 4-6). [provider]  Active Self, Pharmacy Records           Med Note LEOBARDO, NICOLE   Tue Jul 09, 2024  7:38 AM) Emily on opposite days from ibuprofen .   albuterol  (VENTOLIN  HFA) 108 (90 Base) MCG/ACT inhaler [id number]  Inhale 2 puffs into the lungs every 6 (six) hours as needed for wheezing or shortness of breath. Samtani, Jai-Gurmukh, MD  Active   apixaban  (ELIQUIS ) 5 MG TABS tablet [id number]  Take 1 tablet (5 mg total) by mouth 2 (two) times daily. MadireddyAlean [name], MD  Active Self, Pharmacy Records  aspirin  EC 81 MG tablet [id number]  Take 1 tablet (81 mg total) by mouth daily. Swallow whole. [name], [name], MD  Active Self, Pharmacy Records  calcium  carbonate (TUMS EX) 750 MG chewable tablet [id number]  Chew 1-2 tablets by mouth 2 (two) times daily as needed for heartburn. [provider]  Active Self, Pharmacy Records  Clotrimazole  1 % OINT [id number]  Apply to affected area twice daily as needed [name], NP  Active Self, Pharmacy Records  doxycycline  (VIBRA -TABS) 100 MG tablet [id number]  Take 1 tablet (100 mg total) by mouth every 12 (twelve) hours.  Patient not taking: Reported on 07/17/2024   Samtani, Jai-Gurmukh, MD  Active   empagliflozin  (JARDIANCE ) 10 MG TABS tablet [id number]  Take 1 tablet (10 mg total) by mouth daily before breakfast. [name], [name], MD  Active Self, Pharmacy Records  fluticasone -salmeterol [facility] INHUB) 100-50 MCG/ACT AEPB [id number]  Inhale 1 puff into the lungs 2 (two) times daily. Isadora Hose, MD  Active Self, Pharmacy Records  furosemide  (LASIX ) 20 MG tablet [id number]  Take 1 tablet (20 mg total) by mouth as directed. Take 20 mg daily and an additional 20 mg every other day  Patient taking differently: Take 20-40 mg by mouth in the morning. Take one tablet (20mg ) by mouth every other day.  On opposite  days, take two tablets (40mg ) by mouth.   MadireddyAlean [name], MD  Expired 07/28/24 2359 Self, Pharmacy Records           Med Note (LEE, NICOLE   Tue Jul 09, 2024  7:37 AM) Two tablets taken on 07/08/24  ibuprofen  (ADVIL ) 200 MG tablet [id number]  Take 200 mg by mouth every 6 (six) hours as needed for moderate pain (pain score 4-6). [provider]  Active Self, Pharmacy Records           Med Note LEOBARDO, NICOLE   Tue Jul 09, 2024  7:38 AM) Emily on opposite days from acetaminophen    loratadine  (CLARITIN ) 10 MG tablet [id number]  Take 10 mg by mouth daily. [provider]  Active Self, Pharmacy Records  metoprolol  succinate (TOPROL  XL) 25 MG 24 hr tablet [id number]  Take 1 tablet (25 mg total) by mouth daily. [name], [name], MD  Active Self, Pharmacy Records  Multiple Vitamins-Minerals (MULTIVITAMIN GUMMIES ADULT) CHEW [id number]  Chew 8 each by mouth in the morning. Chew eight gummies by mouth daily in the morning. [provider]  Active Self, Pharmacy Records  Oxycodone  HCl 10 MG TABS [id number]  Take 1 tablet by mouth every 6 (six) hours as needed. [provider]  Active Self, Pharmacy Records  OXYGEN  [id number]  Inhale 4 L/min into the lungs continuous. [provider]  Active Self, Pharmacy Records  predniSONE  (DELTASONE ) 10 MG tablet [id number]  Take 1 tablet (10 mg total) by mouth daily with breakfast.  Patient not taking: Reported on 07/17/2024   Samtani, Jai-Gurmukh, MD  Active   Spacer/Aero-Holding Chambers DEVI [id number]  1 each by Does not apply route 4 (four) times daily as needed. Samtani, Jai-Gurmukh, MD  Active   STELARA 45 MG/0.5ML injection [id number]  Inject 45 mg into the skin as directed. Every 12 weeks [provider]  Active Self, Pharmacy Records           Med Note (GARNER, TIFFANY L   Mon Apr 29, 2024 10:05 AM)    Timolol  Maleate, Once-Daily, 0.5 % SOLN [id number]  Place 1 drop into both eyes in the morning and at  bedtime. [provider]  Active Self, Pharmacy Records  Travoprost, BAK Free, (TRAVATAN) 0.004 % SOLN ophthalmic solution [id number]  Place 1 drop into both eyes in the morning. [provider]  Active Self, Pharmacy Records            Recommendation:   Discharge from [facility] Program. Goals met. The patient declines CCM  Follow Up Plan:   Closing From:  Transitions of Care Program  [facility], BSN, RN [place]  VBCI - [facility] RN Care Manager [phone]     "

## 2024-08-23 ENCOUNTER — Encounter: Attending: Student in an Organized Health Care Education/Training Program | Admitting: Internal Medicine

## 2024-08-23 ENCOUNTER — Encounter: Payer: Self-pay | Admitting: Internal Medicine

## 2024-08-23 VITALS — BP 118/71 | HR 63 | Temp 97.4°F | Resp 20 | Ht 73.0 in | Wt 191.0 lb

## 2024-08-23 DIAGNOSIS — R7689 Other specified abnormal immunological findings in serum: Secondary | ICD-10-CM | POA: Diagnosis not present

## 2024-08-23 DIAGNOSIS — Z902 Acquired absence of lung [part of]: Secondary | ICD-10-CM | POA: Diagnosis not present

## 2024-08-23 DIAGNOSIS — I429 Cardiomyopathy, unspecified: Secondary | ICD-10-CM | POA: Diagnosis not present

## 2024-08-23 DIAGNOSIS — L408 Other psoriasis: Secondary | ICD-10-CM | POA: Insufficient documentation

## 2024-08-23 NOTE — Assessment & Plan Note (Addendum)
 On stelara injections, currently well controlled. Unclear if any joint inflammation was previously related, would not account for serology findings. Timeline does not closely track with skin disease or treatment.

## 2024-08-23 NOTE — Assessment & Plan Note (Addendum)
 Rheumatoid factor significantly positive. Differential includes rheumatoid arthritis and false positives due to cancer. Inflammation markers not elevated on his outside labs and clinically there is no active synovitis on exam today.  He is not on any steroid or other systemic anti-inflammatory drugs for masking symptoms.  Certainly could monitor for symptom progression if labs remain positive but without clinical disease activity at this time.  If serology is more indicative for inflammation could try starting on DMARD and assessing clinically for response. - Rechecked rheumatoid factor test. - Evaluated other markers for immune disease activity. - Provided reading material on rheumatoid arthritis and related conditions.   Orders:   Protein Electrophoresis, (serum)   Rheumatoid factor   Cyclic citrul peptide antibody, IgG   Sedimentation rate   Anti-DNA antibody, double-stranded   C3 and C4

## 2024-08-23 NOTE — Progress Notes (Signed)
 "  Office Visit Note  Patient: Brett [name]             Date of Birth: Jan 27, 1956           MRN: [id number]             PCP: [name], NP Referring: [name], NP Visit Date: 08/23/2024 Occupation: Data Unavailable  Subjective:  New Patient (Initial Visit) (Abnormal labs)   Discussed the use of AI scribe software for clinical note transcription with the patient, who gave verbal consent to proceed.  History of Present Illness   Brett [name] is a 69 year old male who presents for evaluation of elevated rheumatoid factor test results.   A routine blood test at his pain management clinic [facility] revealed a rheumatoid factor of 172, which was later confirmed by his primary care physician to have increased to 203 in August after lobectomy in July. He is unsure of the implications of these results and is seeking further evaluation. He has swelling in his feet, numbness and tingling in his hands and arms, and dry eyes. He has chronic arthritis pain particularly throughout his neck and back managed with multiple surgical fusions and long term oral opioid medication.  He underwent lung surgery to remove a lower lobe due to early-stage cancer, which did not require chemotherapy as it had not spread to the lymph nodes on imaging surveillance. He required supplemental oxygen  since surgery and with baseline emphysema. He is scheduled for a six-month follow-up with his oncologist in February.  He has a history of spinal fusion from L1 to L5, and experiences ongoing pain. He has tried injections for pain relief, which only provided temporary relief. He associates numbness and tingling in his hands and arms with his back and neck issues.  He has a history of congenital right hip issues, having undergone multiple surgeries including a hip replacement at age 30 and a partial replacement at age 39. He continues to experience hip pain.  He experiences swelling in  his feet, particularly the left foot, and is on a regimen of furosemide , taking two tablets one day and one the next, as prescribed by his cardiologist.  He has a history of glaucoma and uses eye drops to manage dry eyes. He also has mild Dupuytren's contracture in his palms, more pronounced on the left side.     He developed highly active psoriasis since several years ago, with a strong family history of the same. He is taking stelara injections managed by his dermatology office for about 1.5 years in total now. Skin disease is doing well on this regimen.   Activities of Daily Living:  Patient reports morning stiffness for 1 hour.   Patient Reports nocturnal pain.  Difficulty dressing/grooming: Reports Difficulty climbing stairs: Reports Difficulty getting out of chair: Reports Difficulty using hands for taps, buttons, cutlery, and/or writing: Denies  Review of Systems  Constitutional:  Positive for fatigue.  HENT:  Positive for mouth dryness. Negative for mouth sores.   Eyes:  Negative for dryness.  Respiratory:  Positive for shortness of breath.   Cardiovascular:  Positive for chest pain and palpitations.  Gastrointestinal:  Negative for blood in stool, constipation and diarrhea.  Endocrine: Negative for increased urination.  Genitourinary:  Negative for involuntary urination.  Musculoskeletal:  Positive for joint pain, gait problem, joint pain, joint swelling, myalgias, muscle weakness, morning stiffness, muscle tenderness and myalgias.  Skin:  Negative for color change, rash, hair loss  and sensitivity to sunlight.  Allergic/Immunologic: Negative for susceptible to infections.  Neurological:  Negative for dizziness and headaches.  Hematological:  Negative for swollen glands.  Psychiatric/Behavioral:  Positive for sleep disturbance. Negative for depressed mood. The patient is not nervous/anxious.     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) 06/07/2024   Acquired hammer toe of left foot 04/29/2024   Acquired hammer toe of right foot 04/29/2024   Gait abnormality 04/29/2024   Lower limb length difference 04/29/2024   Other acquired deformities of left foot 04/29/2024   Atrial fibrillation (HCC) 04/29/2024   Chronic CHF (congestive heart failure) (HCC) 03/26/2024   Abnormal chest x-ray 03/13/2024   Edema 03/13/2024   Balanitis 03/13/2024   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 08/06/2023   Paroxysmal SVT (supraventricular tachycardia) (HCC), incidental finding IntraOp back surgery requiring esmolol ; event monitor November 2024 for short runs, longest episode 17 beats. 06/19/2023   Spinal stenosis of lumbar region 06/16/2023   Allergy    Arthritis    Blood transfusion without reported diagnosis    Chicken pox    Congenital hip deformity    History of cardiac dysrhythmia 05/23/2023   Lumbar adjacent segment disease with spondylolisthesis 05/09/2023   Corn of foot 08/09/2022   Spondylolisthesis at L5-S1 level 09/14/2020   Isthmic spondylolisthesis 05/05/2020   Scoliosis deformity of spine 05/05/2020   Neuromuscular disorder (HCC) 11/2019   Pain of left hip joint 11/01/2018   Cervical nerve root  compression 10/07/2015   Cervical neck pain with evidence of disc disease 09/22/2015   Erectile dysfunction 09/22/2015   Rhinitis, allergic 09/22/2015   Bilateral low back pain without sciatica 09/22/2015   Need for shingles vaccine 09/22/2015   Degenerative disc disease, cervical 01/29/2015   Preventative health care 01/29/2015   Tobacco abuse 05/30/2013   Rosacea 05/30/2013   Eczema 05/30/2013   Glaucoma 05/30/2013   Personal history of congenital hip dysplasia 05/30/2013    Past Medical History:  Diagnosis Date   Allergy    Arthritis    Asthma    Atrial fibrillation (HCC) 04/29/2024   Bilateral low back pain without sciatica 09/22/2015   Blood transfusion without reported diagnosis    Cancer (HCC)    Left Lower Lobe Adenocarcinoma   Cardiomyopathy (HCC)    Cervical neck pain with evidence of disc disease 09/22/2015   Chicken pox    Chronic CHF (congestive heart failure) (HCC) 03/26/2024   Congenital hip deformity    Corn of foot 08/09/2022   Degenerative disc disease, cervical 01/29/2015   Dysrhythmia    SVT   Eczema 05/30/2013   Erectile dysfunction 09/22/2015   Glaucoma 05/30/2013   History of cardiac dysrhythmia 05/23/2023   Isthmic spondylolisthesis 05/05/2020   Lumbar  adjacent segment disease with spondylolisthesis 05/09/2023   Lung cancer (HCC) 01/15/2024   Need for shingles vaccine 09/22/2015   Neuromuscular disorder (HCC) 11/2019   sciatica   Nodule of left lung    Nonsustained ventricular tachycardia (HCC), identified on event monitor November 2024, 1 episode 10 beats duration, asymptomatic. 08/06/2023   Pain of left hip joint 11/01/2018   Paroxysmal SVT (supraventricular tachycardia) (HCC), incidental finding IntraOp back surgery requiring esmolol ; event monitor November 2024 for short runs, longest episode 17 beats. 06/19/2023   Event monitor November 2024, supraventricular ectopy burden 1.7%.  4 short runs of SVT, longest episode 17 beats.  No symptoms  reported     Personal history of congenital hip dysplasia 05/30/2013   Preventative health care 01/29/2015   Rhinitis, allergic 09/22/2015   Rosacea 05/30/2013   Scoliosis deformity of spine 05/05/2020   Spinal stenosis of lumbar region 06/16/2023   Spondylolisthesis at L5-S1 level 09/14/2020   Tobacco abuse 05/30/2013    Family History  Problem Relation Age of Onset   Cancer Mother 64       history of colon cancer   Rosacea Mother    Colon cancer Mother 49   Colon polyps Mother    Heart disease Mother    Rosacea Father    Lung disease Father        ?pulmonary fibrosis   Alcohol abuse Brother    Depression Brother    Drug abuse Brother    Colon cancer Maternal Grandmother    Endocrine tumor Daughter        pituitary tumor, POTTS   Thyroid  disease Son        ?hyperthyroid   Cancer Cousin        colon   Colon cancer Cousin    Esophageal cancer Neg Hx    Rectal cancer Neg Hx    Stomach cancer Neg Hx    Past Surgical History:  Procedure Laterality Date   ANTERIOR LAT LUMBAR FUSION Right 05/09/2023   Procedure: Extreme Lateral Interbody Fusion Lumbar three-four -right;  Surgeon: Louis Shove, MD;  Location: [facility] OR;  Service: Neurosurgery;  Laterality: Right;  3C   BRONCHOSCOPY, WITH BIOPSY USING ELECTROMAGNETIC NAVIGATION Bilateral 01/09/2024   Procedure: ROBOTIC ASSISTED NAVIGATIONAL BRONCHOSCOPY;  Surgeon: Isadora Hose, MD;  Location: [facility] ORS;  Service: Pulmonary;  Laterality: Bilateral;   COLONOSCOPY  06/17/2015   Pyrtle   COLONOSCOPY  07/20/2020   Pyrtle   COLONOSCOPY  02/15/2024   ENDOBRONCHIAL ULTRASOUND Bilateral 01/09/2024   Procedure: ENDOBRONCHIAL ULTRASOUND (EBUS);  Surgeon: Isadora Hose, MD;  Location: [facility] ORS;  Service: Pulmonary;  Laterality: Bilateral;   HAMMER TOE SURGERY  07/28/2011   Procedure: HAMMER TOE CORRECTION;  Surgeon: Toribio JULIANNA Chancy, MD;  Location: [facility];  Service: Orthopedics;  Laterality: Right;  right 2nd and 4th toes  correction hammer toe, capsulotomy metatarsal-phalangeal joints   HERNIA REPAIR Bilateral    HIP SURGERY  1986 & 2010   rt total hip-8/10-multiple rt hip surgeries- had 4 prior to 1986 as a child   INTERCOSTAL NERVE BLOCK Left 02/19/2024   Procedure: BLOCK, NERVE, INTERCOSTAL;  Surgeon: Kerrin Elspeth BROCKS, MD;  Location: [facility] OR;  Service: Thoracic;  Laterality: Left;   JOINT REPLACEMENT  2010   Partial Right Hip   LOBECTOMY, LUNG, ROBOT-ASSISTED, USING VATS Left 02/19/2024   Procedure: LOBECTOMY, LUNG, ROBOT-ASSISTED, USING VATS;  Surgeon: Kerrin Elspeth BROCKS, MD;  Location: [facility] OR;  Service: Thoracic;  Laterality: Left;  ROBOTIC LEFT LOWER  LOBECTOMY   lumber fusion  09/2020   POLYPECTOMY     RIGHT/LEFT HEART CATH AND CORONARY ANGIOGRAPHY N/A 05/10/2024   Procedure: RIGHT/LEFT HEART CATH AND CORONARY ANGIOGRAPHY;  Surgeon: Wonda Sharper, MD;  Location: [facility] INVASIVE CV LAB;  Service: Cardiovascular;  Laterality: N/A;   SENTINEL NODE BIOPSY Left 02/19/2024   Procedure: BIOPSY, LYMPH NODE;  Surgeon: Kerrin Elspeth BROCKS, MD;  Location: [facility] OR;  Service: Thoracic;  Laterality: Left;   SPINE SURGERY  09/14/2020   THUMB ARTHROSCOPY  2008   rt   TOTAL HIP ARTHROPLASTY Right 1985   VASECTOMY  1990   VIDEO BRONCHOSCOPY N/A 02/23/2024   Procedure: VIDEO BRONCHOSCOPY WITHOUT FLUORO;  Surgeon: Kerrin Elspeth BROCKS, MD;  Location: [facility] ENDOSCOPY;  Service: Thoracic;  Laterality: N/A;  Bronch with IBV insertion   VIDEO BRONCHOSCOPY WITH INSERTION OF INTERBRONCHIAL VALVE (IBV) N/A 02/23/2024   Procedure: BRONCHOSCOPY, FLEXIBLE, WITH INTRABRONCHIAL VALVE INSERTION;  Surgeon: Kerrin Elspeth BROCKS, MD;  Location: [facility] ENDOSCOPY;  Service: Thoracic;  Laterality: N/A;   Social History[1] Social History   Social History Narrative   Quality control Tech- gauges/callibrations.  (Retired)   Some college/tech school   wife passed   3 grown children (oldest daughter is living with them) youngest daughter  lives in [place].  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  Son lives near [place]- framing/art.     Immunization History  Administered Date(s) Administered   Fluad  Quad(high Dose 65+) 04/24/2020, 04/26/2021, 05/27/2022   Fluad  Trivalent(High Dose 65+) 04/28/2023   INFLUENZA, HIGH DOSE SEASONAL PF 05/07/2024   Influenza,inj,Quad PF,6+ Mos 04/13/2018, 04/19/2019   Influenza-Unspecified 05/09/2015, 06/08/2017, 05/20/2022   PFIZER Comirnaty (Gray Top)Covid-19 Tri-Sucrose Vaccine 12/29/2020   PFIZER(Purple Top)SARS-COV-2 Vaccination 09/12/2019, 10/07/2019, 05/12/2020, 05/27/2022   PNEUMOCOCCAL CONJUGATE-20 08/09/2022   Pfizer Covid-19 Vaccine Bivalent Booster 48yrs & up 04/28/2021   Pfizer(Comirnaty )Fall Seasonal Vaccine 12 years and older 05/19/2022, 04/28/2023, 05/07/2024   Pneumococcal Polysaccharide-23 04/24/2020   RSV IGIV 06/03/2022   Respiratory Syncytial Virus Vaccine ,Recomb Aduvanted(Arexvy ) 05/27/2022   Tdap 01/29/2015   Zoster Recombinant(Shingrix ) 04/13/2018, 06/19/2018   Zoster, Live 09/22/2015     Objective: Vital Signs: BP 118/71 (BP Location: Left Arm, Patient Position: Sitting, Cuff Size: Normal)   Pulse 63   Temp (!) 97.4 F (36.3 C)   Resp 20   Ht 6' 1 (1.854 m)   Wt 191 lb (86.6 kg)   BMI 25.20 kg/m    Physical Exam Eyes:     Conjunctiva/sclera: Conjunctivae normal.  Cardiovascular:     Rate and Rhythm: Normal rate and regular rhythm.  Pulmonary:     Comments: On supplemental oxygen  by nasal cannula Musculoskeletal:     Right lower leg: No edema.     Left lower leg: No edema.  Lymphadenopathy:     Cervical: No cervical adenopathy.  Skin:    General: Skin is warm and dry.     Findings: No rash.  Neurological:     Mental Status: He is alert.  Psychiatric:        Mood and Affect: Mood normal.      Musculoskeletal Exam:  Right shoulder lateral pain and radiating paresthesia with abduction and internal rotation Elbows full ROM no tenderness or  swelling Wrists full ROM no tenderness or swelling Fingers Heberdon's nodes, right thumb squaring and reversible MCP subluxation, mild dupuytren's contractures proximal to left and right 4th MCPs Hips negligible intenral rotation Right lateral hip tenderness Knees full ROM no tenderness or swelling Ankle decreased dorsiflexion, externally rotated  Investigation: No additional findings.  Imaging: No results found.  Recent Labs: Lab Results  Component Value Date   WBC 7.6 07/17/2024   HGB 13.7 07/17/2024   PLT 312.0 07/17/2024   NA 140 07/10/2024   K 4.2 07/10/2024   CL 100 07/10/2024   CO2 28 07/10/2024   GLUCOSE 113 (H) 07/10/2024   BUN 9 07/10/2024   CREATININE 0.98 07/10/2024   BILITOT 0.4 07/10/2024   ALKPHOS 43 07/10/2024   AST 19 07/10/2024   ALT 13 07/10/2024   PROT 6.1 (L) 07/10/2024   ALBUMIN 2.7 (L) 07/10/2024   CALCIUM  8.9 07/10/2024   GFRAA  03/19/2009    >60        The eGFR has been calculated using the MDRD equation. This calculation has not been validated in all clinical situations. eGFR's persistently <60 mL/min signify possible Chronic Kidney Disease.    Speciality Comments: No specialty comments available.  Procedures:  No procedures performed Allergies: Patient has no known allergies.   Assessment / Plan:     Visit Diagnoses:  Assessment & Plan Rheumatoid factor positive Positive ANA (antinuclear antibody) S/P partial lobectomy of lung Rheumatoid factor significantly positive. Differential includes rheumatoid arthritis and false positives due to cancer. Inflammation markers not elevated on his outside labs and clinically there is no active synovitis on exam today.  He is not on any steroid or other systemic anti-inflammatory drugs for masking symptoms.  Certainly could monitor for symptom progression if labs remain positive but without clinical disease activity at this time.  If serology is more indicative for inflammation could try  starting on DMARD and assessing clinically for response. - Rechecked rheumatoid factor test. - Evaluated other markers for immune disease activity. - Provided reading material on rheumatoid arthritis and related conditions.   Orders:   Protein Electrophoresis, (serum)   Rheumatoid factor   Cyclic citrul peptide antibody, IgG   Sedimentation rate   Anti-DNA antibody, double-stranded   C3 and C4  Cardiomyopathy, unspecified type (HCC) Cardiomyopathy was unremarkable PET CT scan for inflammation.  I would think low suspicion for other related process such as RA associated amyloidosis without any characteristic skin or kidney findings.  Also checking protein electrophoresis for workup as above.    Other psoriasis On stelara injections, currently well controlled. Unclear if any joint inflammation was previously related, would not account for serology findings. Timeline does not closely track with skin disease or treatment.     Osteoarthritis Significant degenerative changes due to age and use. No signs of rheumatoid arthritis. Symptoms include joint pain and stiffness. - Provided information on osteoarthritis management. - Discussed symptomatic treatment options and supplements.  Chronic back pain with prior spinal fusion Chronic pain persists despite multiple spinal fusions. Current management includes physical therapy and medications. Injections ineffective. Pain management complicated by regulatory requirements. - Agree with physical therapy. - Agree with ongoing pain management regimen.   Follow-Up Instructions: No follow-ups on file.   Lonni LELON Ester, MD  Note - This record has been created using [employer].  Chart creation errors have been sought, but may not always  have been located. Such creation errors do not reflect on  the standard of medical care.     [1]  Social History Tobacco Use   Smoking status: Former    Current packs/day: 0.00    Average  packs/day: 0.5 packs/day for 50.3 years (25.2 ttl pk-yrs)    Types: Cigarettes    Start date: 70    Quit  date: 12/11/2023    Years since quitting: 0.7    Passive exposure: Never   Smokeless tobacco: Never   Tobacco comments:    Started smoking at 69 years old.    Smoked 1 PPD at his heaviest.    Quit smoking on 12/11/2023  Vaping Use   Vaping status: Some Days  Substance Use Topics   Alcohol use: Yes    Alcohol/week: 14.0 standard drinks of alcohol    Types: 14 Standard drinks or equivalent per week   Drug use: No   "

## 2024-08-23 NOTE — Assessment & Plan Note (Addendum)
 Cardiomyopathy was unremarkable PET CT scan for inflammation.  I would think low suspicion for other related process such as RA associated amyloidosis without any characteristic skin or kidney findings.  Also checking protein electrophoresis for workup as above.

## 2024-08-23 NOTE — Patient Instructions (Signed)
        Supplements for Osteoarthritis  Natural anti-inflammatories can help reduce inflammation and joint stiffness without some of the harmful side effects of non-steroidal anti-inflammatories (Advil, Motrin, Aleve , etc)  Recommend starting with one supplement and give a 3-4 week trial period before adding another  You may be able to find some of these products at your local pharmacy, but may also purchase at [employer], other specialty stores, or online   Turmeric Recommended dose 400mg  once a day May increase to twice a day if tolerated (may cause stomach upset) Do not take if you are on a blood thinner, and stop prior to surgery   Ginger (root or capsules) Recommended dose is 2 grams twice daily, or 2 cups of tea daily Do not take if you are on a blood thinner, and stop prior to surgery   Fish Oil or Omega 3 Recommended dose for capsule is 2 grams twice daily (make sure it contains at least 30% of EPA/DHA) For food, two 3-ounce servings of fish a week, or flaxseed, chia seeds, walnuts, and almonds   Tart Cherry (dried, extract, or tablets) Recommended dose is 500mg  once a day   *Although these are natural products, they can still interact with medications. Always consult with your doctor or pharmacist when starting new supplements and/or medications*  *Patents should be under the care of a physician or other medical provide while taking these supplements*

## 2024-08-26 ENCOUNTER — Ambulatory Visit

## 2024-08-26 VITALS — BP 132/80 | HR 74 | Ht 73.0 in | Wt 188.8 lb

## 2024-08-26 DIAGNOSIS — I471 Supraventricular tachycardia, unspecified: Secondary | ICD-10-CM | POA: Diagnosis not present

## 2024-08-26 DIAGNOSIS — I429 Cardiomyopathy, unspecified: Secondary | ICD-10-CM | POA: Diagnosis not present

## 2024-08-26 DIAGNOSIS — I480 Paroxysmal atrial fibrillation: Secondary | ICD-10-CM | POA: Diagnosis not present

## 2024-08-26 DIAGNOSIS — I5042 Chronic combined systolic (congestive) and diastolic (congestive) heart failure: Secondary | ICD-10-CM

## 2024-08-26 NOTE — Patient Instructions (Signed)
 Medication Instructions:  Your physician recommends that you continue on your current medications as directed. Please refer to the Current Medication list given to you today.  *If you need a refill on your cardiac medications before your next appointment, please call your pharmacy*  Lab Work: None If you have labs (blood work) drawn today and your tests are completely normal, you will receive your results only by: MyChart Message (if you have MyChart) OR A paper copy in the mail If you have any lab test that is abnormal or we need to change your treatment, we will call you to review the results.  Testing/Procedures: None  Follow-Up: At [facility], you and your health needs are our priority.  As part of our continuing mission to provide you with exceptional heart care, our providers are all part of one team.  This team includes your primary Cardiologist (physician) and Advanced Practice Providers or APPs (Physician Assistants and Nurse Practitioners) who all work together to provide you with the care you need, when you need it.  Your next appointment:   6 month(s)  Provider:   Alean Kobus, MD    We recommend signing up for the patient portal called MyChart.  Sign up information is provided on this After Visit Summary.  MyChart is used to connect with patients for Virtual Visits (Telemedicine).  Patients are able to view lab/test results, encounter notes, upcoming appointments, etc.  Non-urgent messages can be sent to your provider as well.   To learn more about what you can do with MyChart, go to [email or web address].   Other Instructions Please keep a BP log for 2 weeks and send by MyChart or mail.                      Dr. Kobus [address] [place], [place] [address]  Blood Pressure Record Sheet To take your blood pressure, you will need a blood pressure machine. You can buy a blood pressure machine (blood pressure monitor) at your clinic, drug store, or  online. When choosing one, consider: An automatic monitor that has an arm cuff. A cuff that wraps snugly around your upper arm. You should be able to fit only one finger between your arm and the cuff. A device that stores blood pressure reading results. Do not choose a monitor that measures your blood pressure from your wrist or finger. Follow your health care provider's instructions for how to take your blood pressure. To use this form: Get one reading in the morning (a.m.) 1-2 hours after you take any medicines. Get one reading in the evening (p.m.) before supper.   Blood pressure log Date: _______________________  a.m. _____________________(1st reading) HR___________            p.m. _____________________(2nd reading) HR__________  Date: _______________________  a.m. _____________________(1st reading) HR___________            p.m. _____________________(2nd reading) HR__________  Date: _______________________  a.m. _____________________(1st reading) HR___________            p.m. _____________________(2nd reading) HR__________  Date: _______________________  a.m. _____________________(1st reading) HR___________            p.m. _____________________(2nd reading) HR__________  Date: _______________________  a.m. _____________________(1st reading) HR___________            p.m. _____________________(2nd reading) HR__________  Date: _______________________  a.m. _____________________(1st reading) HR___________            p.m. _____________________(2nd reading) HR__________  Date: _______________________  a.m. _____________________(1st  reading) HR___________            p.m. _____________________(2nd reading) HR__________   This information is not intended to replace advice given to you by your health care provider. Make sure you discuss any questions you have with your health care provider. Document Revised: 11/13/2019 Document Reviewed: 11/13/2019 Elsevier Patient  Education  2021 [employer].

## 2024-08-26 NOTE — Progress Notes (Signed)
 "  Cardiology Consultation:    Date:  08/26/2024   ID:  Brett [name], DOB 1955-08-25, MRN [id number]  PCP:  Brett Setter, NP  Cardiologist:  Brett JONELLE Kobus, MD   Referring MD: Brett Setter, NP   No chief complaint on file.    ASSESSMENT AND PLAN:   Brett [name] 69 year old male  initially seen for short runs of paroxysmal SVT noted during spine surgery in October 2024 requiring esmolol  for termination, subsequently Zio patch noted asymptomatic SVT burden 1.7% and asymptomatic runs of nonsustained ventricular tachycardia on Zio patch monitor with ventricular ectopy burden 1%, low normal LVEF 50 to 55% on echocardiogram December 2024 and no ischemia on cardiac PET stress test from February 2025 but with lower LVEF calculated 46% and coronary artery calcifications in LAD/RCA distribution, further evaluation with cardiac MRI April 2025 noted LVEF 43% with LGE in basal anterior lateral and anteroseptal segments suggestive of old myocarditis versus sarcoidosis related changes which initiated evaluation for cardiac sarcoidosis and referral to Brett [name].  No obvious evidence of sarcoidosis or amyloidosis.  Further evaluation with right and left heart cath 05/10/2024 noted diffuse calcified but nonobstructive coronary artery disease [with normal hemodynamics LVEDP 12 mmHg, PCWP 10 mmHg, mean PA pressure 21 mmHg, cardiac index 2.7]  COPD on 4 L O2 per minute by nasal cannula, Lung nodule with interval increase in size on imaging from 11/23/2023, diagnosed with lung cancer s/p left lower lobectomy July 2025,  smokes half pack of cigarettes a day [recently quit] and drinks 1 to 2 glasses of wine. Postoperative atrial fibrillation July 2025 after his lung left lower lobectomy surgery [was initially placed on amiodarone , discontinued this in September 2025 on follow-up].  Has positive rheumatoid factor and ANA, psoriasis, chronic back pain uses a walker to ambulate.    Problem  List Items Addressed This Visit       Cardiovascular and Mediastinum   Paroxysmal SVT (supraventricular tachycardia) (HCC), incidental finding IntraOp back surgery requiring esmolol ; event monitor November 2024 for short runs, longest episode 17 beats.   Remains asymptomatic. Continue metoprolol  XL 25 mg once daily.      Cardiomyopathy (HCC), reduced LVEF on cardiac PET imaging   Mild cardiomyopathy with LVEF 43% on cardiac MRI April 2025. Unremarkable workup for sarcoidosis and amyloidosis. Continue guideline directed medical therapy as discussed under CHF.       Chronic CHF (congestive heart failure) (HCC) - Primary   Compensated. Significant functional limitation due to underlying pulmonary issues.   Continue with salt restriction below 2 g/day. Continue furosemide  20 mg once daily and every other day take an additional 20 mg furosemide  in the morning.   GDMT limited due to hypotension and hyperkalemia in the past. Continue metoprolol  succinate 25 mg once daily. Continue Jardiance  10 mg once daily. With the prior hyperkalemia and hypotension issues hold off on Entresto  and spironolactone.  If blood pressures remain stable at home with systolic consistently above 110 mmHg and renal function remains stable we will consider reintroducing low-dose Entresto .        Atrial fibrillation (HCC)   Postoperative atrial fibrillation July 2025 after his lung left lower lobectomy surgery. Was started on amiodarone  and Eliquis  at the time.  And discontinued September 2025. Remains in sinus rhythm today. CHA2DS2-VASc score 3.  Remains in sinus rhythm today. Continue Eliquis  5 mg twice daily. Significant risk for recurrence of A-fib.      Relevant Orders   EKG 12-Lead (Completed)   Will message  genetic counselor that he had a visit with before to update him regarding test results from his genetic screening.  Return to clinic in 6 months  History of Present Illness:    Brett [name] is a 69 y.o. male who is being seen today for follow-up visit. PCP is Brett Setter, NP. Last visit with me in the office was 04/29/2024. Follows up with rheumatologist Brett [name]. Follows up with pulmonologist BrettDgayli.   initially seen for short runs of paroxysmal SVT noted during spine surgery in October 2024 requiring esmolol  for termination, subsequently Zio patch noted asymptomatic SVT burden 1.7% and asymptomatic runs of nonsustained ventricular tachycardia on Zio patch monitor with ventricular ectopy burden 1%, low normal LVEF 50 to 55% on echocardiogram December 2024 and no ischemia on cardiac PET stress test from February 2025 but with lower LVEF calculated 46% and coronary artery calcifications in LAD/RCA distribution, further evaluation with cardiac MRI noted LVEF 43% with LGE in basal anterior lateral and anteroseptal segments suggestive of old myocarditis versus sarcoidosis related changes which initiated evaluation for cardiac sarcoidosis and referral to Brett [name].  Further evaluation with right and left heart cath 05/10/2024 noted diffuse calcified but nonobstructive coronary artery disease [with normal hemodynamics LVEDP 12 mmHg, PCWP 10 mmHg, mean PA pressure 21 mmHg, cardiac index 2.7]  COPD on 4 L O2 per minute by nasal cannula, Lung nodule with interval increase in size on imaging from 11/23/2023, diagnosed with lung cancer s/p left lower lobectomy July 2025,  smokes half pack of cigarettes a day [recently quit] and drinks 1 to 2 glasses of wine. Postoperative atrial fibrillation July 2025 after his lung left lower lobectomy surgery [was initially placed on amiodarone , discontinued this in September 2025 on follow-up].  Has positive rheumatoid factor and ANA, psoriasis, chronic back pain uses a walker to ambulate.  EKG in the clinic today shows sinus rhythm heart rate 74/min, PR interval 148 ms, QRS duration 80 ms, QTc normal 448 ms.  No ischemia.  Here for the  visit by himself.  Had an episode of respiratory failure in December 2025 in the setting of parainfluenza virus 1 infection. Overall remains at his baseline.  Functional status limited due to shortness of breath.  On O2 dependent by nasal cannula flow at 4 L/min. Denies any new symptoms. Continues to experience atypical intermittent chest pain extending all the way across the chest from right to left shoulder occurs randomly.  Lasting for few minutes. Does remain out of breath which takes several minutes after he sits down or lays down to resolve.  No orthopnea or paroxysmal nocturnal dyspnea. No pedal edema. Not diligent about salt restriction. Takes Lasix  40 mg and 20 mg alternate days once a day.  Mentions blood pressures are typically well-controlled at home around 110s systolic.  Denies any blood in urine or stools  Recent blood work during inpatient stay from 07/10/2024 CMP with sodium 140, potassium 4.2 Normal transaminases and alkaline phosphatase BUN 9 and creatinine 0.98, eGFR greater than 60.  Past Medical History:  Diagnosis Date   Allergy    Arthritis    Asthma    Atrial fibrillation (HCC) 04/29/2024   Bilateral low back pain without sciatica 09/22/2015   Blood transfusion without reported diagnosis    Cancer (HCC)    Left Lower Lobe Adenocarcinoma   Cardiomyopathy (HCC)    Cervical neck pain with evidence of disc disease 09/22/2015   Chicken pox    Chronic CHF (congestive heart failure) (HCC)  03/26/2024   Congenital hip deformity    Corn of foot 08/09/2022   Degenerative disc disease, cervical 01/29/2015   Dysrhythmia    SVT   Eczema 05/30/2013   Erectile dysfunction 09/22/2015   Glaucoma 05/30/2013   History of cardiac dysrhythmia 05/23/2023   Isthmic spondylolisthesis 05/05/2020   Lumbar adjacent segment disease with spondylolisthesis 05/09/2023   Lung cancer (HCC) 01/15/2024   Need for shingles vaccine 09/22/2015   Neuromuscular disorder (HCC) 11/2019    sciatica   Nodule of left lung    Nonsustained ventricular tachycardia (HCC), identified on event monitor November 2024, 1 episode 10 beats duration, asymptomatic. 08/06/2023   Pain of left hip joint 11/01/2018   Paroxysmal SVT (supraventricular tachycardia) (HCC), incidental finding IntraOp back surgery requiring esmolol ; event monitor November 2024 for short runs, longest episode 17 beats. 06/19/2023   Event monitor November 2024, supraventricular ectopy burden 1.7%.  4 short runs of SVT, longest episode 17 beats.  No symptoms reported     Personal history of congenital hip dysplasia 05/30/2013   Preventative health care 01/29/2015   Rhinitis, allergic 09/22/2015   Rosacea 05/30/2013   Scoliosis deformity of spine 05/05/2020   Spinal stenosis of lumbar region 06/16/2023   Spondylolisthesis at L5-S1 level 09/14/2020   Tobacco abuse 05/30/2013    Past Surgical History:  Procedure Laterality Date   ANTERIOR LAT LUMBAR FUSION Right 05/09/2023   Procedure: Extreme Lateral Interbody Fusion Lumbar three-four -right;  Surgeon: Louis Shove, MD;  Location: [facility] OR;  Service: Neurosurgery;  Laterality: Right;  3C   BRONCHOSCOPY, WITH BIOPSY USING ELECTROMAGNETIC NAVIGATION Bilateral 01/09/2024   Procedure: ROBOTIC ASSISTED NAVIGATIONAL BRONCHOSCOPY;  Surgeon: Isadora Hose, MD;  Location: [facility] ORS;  Service: Pulmonary;  Laterality: Bilateral;   COLONOSCOPY  06/17/2015   Pyrtle   COLONOSCOPY  07/20/2020   Pyrtle   COLONOSCOPY  02/15/2024   ENDOBRONCHIAL ULTRASOUND Bilateral 01/09/2024   Procedure: ENDOBRONCHIAL ULTRASOUND (EBUS);  Surgeon: Isadora Hose, MD;  Location: [facility] ORS;  Service: Pulmonary;  Laterality: Bilateral;   HAMMER TOE SURGERY  07/28/2011   Procedure: HAMMER TOE CORRECTION;  Surgeon: Toribio JULIANNA Chancy, MD;  Location: [facility];  Service: Orthopedics;  Laterality: Right;  right 2nd and 4th toes correction hammer toe, capsulotomy metatarsal-phalangeal joints   HERNIA  REPAIR Bilateral    HIP SURGERY  1986 & 2010   rt total hip-8/10-multiple rt hip surgeries- had 4 prior to 1986 as a child   INTERCOSTAL NERVE BLOCK Left 02/19/2024   Procedure: BLOCK, NERVE, INTERCOSTAL;  Surgeon: Kerrin Elspeth BROCKS, MD;  Location: [facility] OR;  Service: Thoracic;  Laterality: Left;   JOINT REPLACEMENT  2010   Partial Right Hip   LOBECTOMY, LUNG, ROBOT-ASSISTED, USING VATS Left 02/19/2024   Procedure: LOBECTOMY, LUNG, ROBOT-ASSISTED, USING VATS;  Surgeon: Kerrin Elspeth BROCKS, MD;  Location: [facility] OR;  Service: Thoracic;  Laterality: Left;  ROBOTIC LEFT LOWER LOBECTOMY   lumber fusion  09/2020   POLYPECTOMY     RIGHT/LEFT HEART CATH AND CORONARY ANGIOGRAPHY N/A 05/10/2024   Procedure: RIGHT/LEFT HEART CATH AND CORONARY ANGIOGRAPHY;  Surgeon: Wonda Sharper, MD;  Location: [facility] INVASIVE CV LAB;  Service: Cardiovascular;  Laterality: N/A;   SENTINEL NODE BIOPSY Left 02/19/2024   Procedure: BIOPSY, LYMPH NODE;  Surgeon: Kerrin Elspeth BROCKS, MD;  Location: [facility] OR;  Service: Thoracic;  Laterality: Left;   SPINE SURGERY  09/14/2020   THUMB ARTHROSCOPY  2008   rt   TOTAL HIP ARTHROPLASTY Right 1985   VASECTOMY  1990   VIDEO BRONCHOSCOPY N/A 02/23/2024   Procedure: VIDEO BRONCHOSCOPY WITHOUT FLUORO;  Surgeon: Kerrin Elspeth BROCKS, MD;  Location: [facility] ENDOSCOPY;  Service: Thoracic;  Laterality: N/A;  Bronch with IBV insertion   VIDEO BRONCHOSCOPY WITH INSERTION OF INTERBRONCHIAL VALVE (IBV) N/A 02/23/2024   Procedure: BRONCHOSCOPY, FLEXIBLE, WITH INTRABRONCHIAL VALVE INSERTION;  Surgeon: Kerrin Elspeth BROCKS, MD;  Location: [facility] ENDOSCOPY;  Service: Thoracic;  Laterality: N/A;    Current Medications: Active Medications[1]   Allergies:   Patient has no known allergies.   Social History   Socioeconomic History   Marital status: Widowed    Spouse name: Not on file   Number of children: Not on file   Years of education: Not on file   Highest education level: Associate degree:  occupational, scientist, product/process development, or vocational program  Occupational History   Not on file  Tobacco Use   Smoking status: Former    Current packs/day: 0.00    Average packs/day: 0.5 packs/day for 50.3 years (25.2 ttl pk-yrs)    Types: Cigarettes    Start date: 4    Quit date: 12/11/2023    Years since quitting: 0.7    Passive exposure: Never   Smokeless tobacco: Never   Tobacco comments:    Started smoking at 69 years old.    Smoked 1 PPD at his heaviest.    Quit smoking on 12/11/2023  Vaping Use   Vaping status: Some Days  Substance and Sexual Activity   Alcohol use: Yes    Alcohol/week: 14.0 standard drinks of alcohol    Types: 14 Standard drinks or equivalent per week   Drug use: No   Sexual activity: Not Currently    Birth control/protection: None  Other Topics Concern   Not on file  Social History Narrative   Quality control Tech- gauges/callibrations.  (Retired)   Some college/tech school   wife passed   3 grown children (oldest daughter is living with them) youngest daughter lives in [place].  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  Son lives near [place]- framing/art.   Social Drivers of Health   Tobacco Use: Medium Risk (08/26/2024)   Patient History    Smoking Tobacco Use: Former    Smokeless Tobacco Use: Never    Passive Exposure: Never  Physicist, Medical Strain: Low Risk (05/31/2024)   Overall Financial Resource Strain (CARDIA)    Difficulty of Paying Living Expenses: Not very hard  Food Insecurity: No Food Insecurity (07/12/2024)   Epic    Worried About Programme Researcher, Broadcasting/film/video in the Last Year: Never true    Ran Out of Food in the Last Year: Never true  Transportation Needs: No Transportation Needs (07/12/2024)   Epic    Lack of Transportation (Medical): No    Lack of Transportation (Non-Medical): No  Physical Activity: Insufficiently Active (05/31/2024)   Exercise Vital Sign    Days of Exercise per Week: 2 days    Minutes of Exercise per Session: 30 min  Stress: No  Stress Concern Present (05/31/2024)   [employer] of Occupational Health - Occupational Stress Questionnaire    Feeling of Stress: Only a little  Social Connections: Moderately Integrated (07/09/2024)   Social Connection and Isolation Panel    Frequency of Communication with Friends and Family: Three times a week    Frequency of Social Gatherings with Friends and Family: Once a week    Attends Religious Services: 1 to 4 times per year    Active Member of [employer] or Organizations: Yes  Attends Banker Meetings: 1 to 4 times per year    Marital Status: Widowed  Depression (PHQ2-9): Low Risk (07/17/2024)   Depression (PHQ2-9)    PHQ-2 Score: 0  Alcohol Screen: Low Risk (05/31/2024)   Alcohol Screen    Last Alcohol Screening Score (AUDIT): 4  Housing: Unknown (07/12/2024)   Epic    Unable to Pay for Housing in the Last Year: No    Number of Times Moved in the Last Year: Not on file    Homeless in the Last Year: No  Utilities: Not At Risk (07/12/2024)   Epic    Threatened with loss of utilities: No  Health Literacy: Not on file     Family History: The patient's family history includes Alcohol abuse in his brother; Cancer in his cousin; Cancer (age of onset: 75) in his mother; Colon cancer in his cousin and maternal grandmother; Colon cancer (age of onset: 68) in his mother; Colon polyps in his mother; Depression in his brother; Drug abuse in his brother; Endocrine tumor in his daughter; Heart disease in his mother; Lung disease in his father; Rosacea in his father and mother; Thyroid  disease in his son. There is no history of Esophageal cancer, Rectal cancer, or Stomach cancer. ROS:   Please see the history of present illness.    All 14 point review of systems negative except as described per history of present illness.  EKGs/Labs/Other Studies Reviewed:    The following studies were reviewed today:   EKG:  EKG Interpretation Date/Time:  Monday August 26 2024  10:08:30 EST Ventricular Rate:  74 PR Interval:  148 QRS Duration:  80 QT Interval:  404 QTC Calculation: 448 R Axis:   46  Text Interpretation: Normal sinus rhythm Normal ECG When compared with ECG of 09-Jul-2024 04:36, PREVIOUS ECG IS PRESENT Confirmed by Liborio Hai reddy [phone]) on 08/26/2024 10:43:17 AM    Recent Labs: 03/03/2024: Magnesium  2.0 04/12/2024: NT-Pro BNP 525 07/09/2024: B Natriuretic Peptide 222.9 07/10/2024: ALT 13; BUN 9; Creatinine, Ser 0.98; Potassium 4.2; Sodium 140 07/17/2024: Hemoglobin 13.7; Platelets 312.0  Recent Lipid Panel    Component Value Date/Time   CHOL 178 04/24/2020 0741   TRIG 79 04/24/2020 0741   HDL 78 04/24/2020 0741   CHOLHDL 2.3 04/24/2020 0741   VLDL 9.8 04/19/2019 0725   LDLCALC 83 04/24/2020 0741    Physical Exam:    VS:  BP 132/80   Pulse 74   Ht 6' 1 (1.854 m)   Wt 188 lb 12.8 oz (85.6 kg)   SpO2 98% Comment: ON 4L  BMI 24.91 kg/m     Wt Readings from Last 3 Encounters:  08/26/24 188 lb 12.8 oz (85.6 kg)  08/23/24 191 lb (86.6 kg)  07/24/24 182 lb 9.6 oz (82.8 kg)     GENERAL:  Well nourished, well developed in no acute distress NECK: No JVD; No carotid bruits CARDIAC: RRR, S1 and S2 present, no murmurs, no rubs, no gallops CHEST:  Clear to auscultation without rales, wheezing or rhonchi  Extremities: No pitting pedal edema. Pulses bilaterally symmetric with radial 2+ and dorsalis pedis 2+ NEUROLOGIC:  Alert and oriented x 3  Medication Adjustments/Labs and Tests Ordered: Current medicines are reviewed at length with the patient today.  Concerns regarding medicines are outlined above.  Orders Placed This Encounter  Procedures   EKG 12-Lead   No orders of the defined types were placed in this encounter.   Signed, Ronnesha Mester reddy Liela Rylee, MD, MPH,  FACC. 08/26/2024 10:53 AM    [facility]     [1]  Current Meds  Medication Sig   acetaminophen  (TYLENOL ) 500 MG tablet Take 500 mg  by mouth every 6 (six) hours as needed for moderate pain (pain score 4-6).   albuterol  (VENTOLIN  HFA) 108 (90 Base) MCG/ACT inhaler Inhale 2 puffs into the lungs every 6 (six) hours as needed for wheezing or shortness of breath.   apixaban  (ELIQUIS ) 5 MG TABS tablet Take 1 tablet (5 mg total) by mouth 2 (two) times daily.   aspirin  EC 81 MG tablet Take 1 tablet (81 mg total) by mouth daily. Swallow whole.   calcium  carbonate (TUMS EX) 750 MG chewable tablet Chew 1-2 tablets by mouth 2 (two) times daily as needed for heartburn.   Clotrimazole  1 % OINT Apply to affected area twice daily as needed   Emollient (AQUAPHOR OINTMENT BODY EX) Apply topically.   empagliflozin  (JARDIANCE ) 10 MG TABS tablet Take 1 tablet (10 mg total) by mouth daily before breakfast.   fluticasone -salmeterol (WIXELA INHUB) 100-50 MCG/ACT AEPB Inhale 1 puff into the lungs 2 (two) times daily.   furosemide  (LASIX ) 20 MG tablet Take 1 tablet (20 mg total) by mouth as directed. Take 20 mg daily and an additional 20 mg every other day (Patient taking differently: Take 20-40 mg by mouth in the morning. Take one tablet (20mg ) by mouth every other day.  On opposite days, take two tablets (40mg ) by mouth.)   ibuprofen  (ADVIL ) 200 MG tablet Take 200 mg by mouth every 6 (six) hours as needed for moderate pain (pain score 4-6).   metoprolol  succinate (TOPROL  XL) 25 MG 24 hr tablet Take 1 tablet (25 mg total) by mouth daily.   Multiple Vitamins-Minerals (MULTIVITAMIN GUMMIES ADULT) CHEW Chew 8 each by mouth in the morning. Chew eight gummies by mouth daily in the morning.   Oxycodone  HCl 10 MG TABS Take 1 tablet by mouth every 6 (six) hours as needed.   OXYGEN  Inhale 4 L/min into the lungs continuous.   Spacer/Aero-Holding Chambers DEVI 1 each by Does not apply route 4 (four) times daily as needed.   STELARA 45 MG/0.5ML injection Inject 45 mg into the skin as directed. Every 12 weeks   Timolol  Maleate, Once-Daily, 0.5 % SOLN Place 1 drop  into both eyes in the morning and at bedtime.   Travoprost, BAK Free, (TRAVATAN) 0.004 % SOLN ophthalmic solution Place 1 drop into both eyes in the morning.   "

## 2024-08-26 NOTE — Assessment & Plan Note (Signed)
 Compensated. Significant functional limitation due to underlying pulmonary issues.   Continue with salt restriction below 2 g/day. Continue furosemide  20 mg once daily and every other day take an additional 20 mg furosemide  in the morning.   GDMT limited due to hypotension and hyperkalemia in the past. Continue metoprolol  succinate 25 mg once daily. Continue Jardiance  10 mg once daily. With the prior hyperkalemia and hypotension issues hold off on Entresto  and spironolactone.  If blood pressures remain stable at home with systolic consistently above 110 mmHg and renal function remains stable we will consider reintroducing low-dose Entresto .

## 2024-08-26 NOTE — Assessment & Plan Note (Signed)
 Mild cardiomyopathy with LVEF 43% on cardiac MRI April 2025. Unremarkable workup for sarcoidosis and amyloidosis. Continue guideline directed medical therapy as discussed under CHF.

## 2024-08-26 NOTE — Assessment & Plan Note (Signed)
 Postoperative atrial fibrillation July 2025 after his lung left lower lobectomy surgery. Was started on amiodarone  and Eliquis  at the time.  And discontinued September 2025. Remains in sinus rhythm today. CHA2DS2-VASc score 3.  Remains in sinus rhythm today. Continue Eliquis  5 mg twice daily. Significant risk for recurrence of A-fib.

## 2024-08-26 NOTE — Assessment & Plan Note (Signed)
 Remains asymptomatic. Continue metoprolol  XL 25 mg once daily.

## 2024-08-27 LAB — PROTEIN ELECTROPHORESIS, SERUM
Albumin ELP: 4.1 g/dL (ref 3.8–4.8)
Alpha 1: 0.3 g/dL (ref 0.2–0.3)
Alpha 2: 0.8 g/dL (ref 0.5–0.9)
Beta 2: 0.5 g/dL (ref 0.2–0.5)
Beta Globulin: 0.5 g/dL (ref 0.4–0.6)
Gamma Globulin: 1 g/dL (ref 0.8–1.7)
Total Protein: 7.1 g/dL (ref 6.1–8.1)

## 2024-08-27 LAB — RHEUMATOID FACTOR: Rheumatoid fact SerPl-aCnc: 205 [IU]/mL — ABNORMAL HIGH

## 2024-08-27 LAB — SEDIMENTATION RATE: Sed Rate: 17 mm/h (ref 0–20)

## 2024-08-27 LAB — CYCLIC CITRUL PEPTIDE ANTIBODY, IGG: Cyclic Citrullin Peptide Ab: <16 U

## 2024-08-27 LAB — C3 AND C4
C3 Complement: 141 mg/dL (ref 82–185)
C4 Complement: 23 mg/dL (ref 15–53)

## 2024-08-27 LAB — ANTI-DNA ANTIBODY, DOUBLE-STRANDED: ds DNA Ab: <1 [IU]/mL

## 2024-09-03 ENCOUNTER — Ambulatory Visit: Admitting: Family

## 2024-09-04 ENCOUNTER — Ambulatory Visit (HOSPITAL_COMMUNITY)
Admission: RE | Admit: 2024-09-04 | Discharge: 2024-09-04 | Disposition: A | Source: Ambulatory Visit | Attending: Internal Medicine | Admitting: Internal Medicine

## 2024-09-04 ENCOUNTER — Inpatient Hospital Stay: Attending: Internal Medicine

## 2024-09-04 ENCOUNTER — Ambulatory Visit (HOSPITAL_COMMUNITY)
Admission: RE | Admit: 2024-09-04 | Discharge: 2024-09-04 | Disposition: A | Source: Ambulatory Visit | Attending: Otolaryngology

## 2024-09-04 DIAGNOSIS — D119 Benign neoplasm of major salivary gland, unspecified: Secondary | ICD-10-CM

## 2024-09-04 DIAGNOSIS — K118 Other diseases of salivary glands: Secondary | ICD-10-CM | POA: Insufficient documentation

## 2024-09-04 DIAGNOSIS — C3490 Malignant neoplasm of unspecified part of unspecified bronchus or lung: Secondary | ICD-10-CM | POA: Diagnosis present

## 2024-09-04 DIAGNOSIS — I700 Atherosclerosis of aorta: Secondary | ICD-10-CM | POA: Insufficient documentation

## 2024-09-04 DIAGNOSIS — J90 Pleural effusion, not elsewhere classified: Secondary | ICD-10-CM | POA: Diagnosis not present

## 2024-09-04 DIAGNOSIS — J439 Emphysema, unspecified: Secondary | ICD-10-CM | POA: Insufficient documentation

## 2024-09-04 LAB — CMP (CANCER CENTER ONLY)
ALT: 12 U/L (ref 0–44)
AST: 26 U/L (ref 15–41)
Albumin: 4.4 g/dL (ref 3.5–5.0)
Alkaline Phosphatase: 63 U/L (ref 38–126)
Anion gap: 10 (ref 5–15)
BUN: 15 mg/dL (ref 8–23)
CO2: 28 mmol/L (ref 22–32)
Calcium: 9.6 mg/dL (ref 8.9–10.3)
Chloride: 103 mmol/L (ref 98–111)
Creatinine: 1.1 mg/dL (ref 0.61–1.24)
GFR, Estimated: >60 mL/min
Glucose, Bld: 111 mg/dL — ABNORMAL HIGH (ref 70–99)
Potassium: 5.5 mmol/L — ABNORMAL HIGH (ref 3.5–5.1)
Sodium: 140 mmol/L (ref 135–145)
Total Bilirubin: 0.4 mg/dL (ref 0.0–1.2)
Total Protein: 7.6 g/dL (ref 6.5–8.1)

## 2024-09-04 LAB — CBC WITH DIFFERENTIAL (CANCER CENTER ONLY)
Abs Immature Granulocytes: 0.02 10*3/uL (ref 0.00–0.07)
Basophils Absolute: 0.1 10*3/uL (ref 0.0–0.1)
Basophils Relative: 1 %
Eosinophils Absolute: 0.2 10*3/uL (ref 0.0–0.5)
Eosinophils Relative: 3 %
HCT: 43.2 % (ref 39.0–52.0)
Hemoglobin: 14.7 g/dL (ref 13.0–17.0)
Immature Granulocytes: 0 %
Lymphocytes Relative: 19 %
Lymphs Abs: 1.4 10*3/uL (ref 0.7–4.0)
MCH: 32.8 pg (ref 26.0–34.0)
MCHC: 34 g/dL (ref 30.0–36.0)
MCV: 96.4 fL (ref 80.0–100.0)
Monocytes Absolute: 1.2 10*3/uL — ABNORMAL HIGH (ref 0.1–1.0)
Monocytes Relative: 16 %
Neutro Abs: 4.6 10*3/uL (ref 1.7–7.7)
Neutrophils Relative %: 61 %
Platelet Count: 199 10*3/uL (ref 150–400)
RBC: 4.48 MIL/uL (ref 4.22–5.81)
RDW: 13.6 % (ref 11.5–15.5)
WBC Count: 7.5 10*3/uL (ref 4.0–10.5)
nRBC: 0 % (ref 0.0–0.2)

## 2024-09-04 MED ORDER — IOHEXOL 300 MG/ML  SOLN
75.0000 mL | Freq: Once | INTRAMUSCULAR | Status: AC | PRN
  Administered 2024-09-04: 75 mL via INTRAVENOUS

## 2024-09-07 ENCOUNTER — Ambulatory Visit (HOSPITAL_COMMUNITY)
Admission: RE | Admit: 2024-09-07 | Discharge: 2024-09-07 | Disposition: A | Discharge status: Home / Self Care | Source: Ambulatory Visit | Attending: Otolaryngology | Admitting: Otolaryngology

## 2024-09-07 DIAGNOSIS — K118 Other diseases of salivary glands: Secondary | ICD-10-CM | POA: Diagnosis present

## 2024-09-07 DIAGNOSIS — D119 Benign neoplasm of major salivary gland, unspecified: Secondary | ICD-10-CM | POA: Insufficient documentation

## 2024-09-07 MED ORDER — GADOBUTROL 1 MMOL/ML IV SOLN
7.0000 mL | Freq: Once | INTRAVENOUS | Status: AC | PRN
  Administered 2024-09-07: 7 mL via INTRAVENOUS

## 2024-09-10 ENCOUNTER — Ambulatory Visit: Admitting: Thoracic Surgery (Cardiothoracic Vascular Surgery)

## 2024-09-10 ENCOUNTER — Encounter: Payer: Self-pay | Admitting: Thoracic Surgery (Cardiothoracic Vascular Surgery)

## 2024-09-10 VITALS — BP 123/68 | HR 70 | Resp 20 | Ht 73.0 in | Wt 197.0 lb

## 2024-09-10 DIAGNOSIS — C3492 Malignant neoplasm of unspecified part of left bronchus or lung: Secondary | ICD-10-CM | POA: Diagnosis not present

## 2024-09-11 ENCOUNTER — Telehealth: Payer: Self-pay | Admitting: *Deleted

## 2024-09-11 ENCOUNTER — Inpatient Hospital Stay: Admitting: Internal Medicine

## 2024-09-11 VITALS — BP 123/60 | HR 77 | Temp 97.7°F | Resp 17 | Ht 73.0 in | Wt 197.2 lb

## 2024-09-11 DIAGNOSIS — C3490 Malignant neoplasm of unspecified part of unspecified bronchus or lung: Secondary | ICD-10-CM

## 2024-09-11 NOTE — Telephone Encounter (Signed)
 Pt is due for Medicare AWV.  He has upcoming appt with PCP on 10/11/24 @ 9:40. Was going to see if pt would like to schedule the AWV on the same day either before or after PCP visit.  Left message for pt to return my call.

## 2024-09-11 NOTE — Progress Notes (Signed)
 "     [facility] [phone]   [phone]  OFFICE PROGRESS NOTE  [name], NP [address], [address] [place] [address]  DIAGNOSIS: Stage IA (T1b, N0, M0) non-small cell lung cancer, adenocarcinoma presented with left lower lobe lung nodule was suspicious mediastinal lymph nodes diagnosed in June 2025.  PRIOR THERAPY: Status post robotic assisted left lower lobectomy with lymph node dissection under the care of Dr. Kerrin on February 19, 2024.  The tumor size was 1.9 cm.  There was also a separate focus of adenocarcinoma in situ measuring 1.9 cm.  CURRENT THERAPY: Observation.  INTERVAL HISTORY: Brett [name] 69 y.o. male returns to the clinic today for follow-up visit accompanied by his daughter. Discussed the use of AI scribe software for clinical note transcription with the patient, who gave verbal consent to proceed.  History of Present Illness Brett [name] is a 69 year old male with stage IA non-small cell lung cancer, status post left lower lobectomy, who presents for surveillance and evaluation of persistent dyspnea and wheezing.  He was diagnosed with stage IA (T1bN0M0) non-small cell lung cancer, left lower lobe adenocarcinoma, in June 2025 and underwent left lower lobectomy with lymph node dissection in July 2025. He is currently in the surveillance period and is followed with serial imaging. At this visit, he is accompanied by his daughter for evaluation and review of a repeat chest CT for restaging. The most recent imaging demonstrates a small, approximately 8 mm area in the left lung that is not solid or rounded. This area may represent residual inflammation, possibly related to a recent influenza infection and hospitalization in December 2025.  Over the past six months, he has experienced increased wheezing and dyspnea, which are exacerbated by exertion and when lying flat. He uses a dry powder inhaler and  a nebulizer device with aerosol medication, typically administering two sprays as instructed, but perceives minimal benefit. He continues to experience tingling from the nose down and pain across the chest associated with shortness of breath. He now sleeps with his head elevated to alleviate symptoms when supine. He denies hemoptysis or chest pain at rest.  He has comorbid heart failure, described as one side doesn't pump fast enough, and has been monitoring his blood pressure twice daily for two weeks. A heart failure medication was discontinued after surgery due to hypotension. His daughter provides additional history, specifically prompting discussion of his breathing symptoms.     MEDICAL HISTORY: Past Medical History:  Diagnosis Date   Allergy    Arthritis    Asthma    Atrial fibrillation (HCC) 04/29/2024   Bilateral low back pain without sciatica 09/22/2015   Blood transfusion without reported diagnosis    Cancer (HCC)    Left Lower Lobe Adenocarcinoma   Cardiomyopathy (HCC)    Cervical neck pain with evidence of disc disease 09/22/2015   Chicken pox    Chronic CHF (congestive heart failure) (HCC) 03/26/2024   Congenital hip deformity    Corn of foot 08/09/2022   Degenerative disc disease, cervical 01/29/2015   Dysrhythmia    SVT   Eczema 05/30/2013   Erectile dysfunction 09/22/2015   Glaucoma 05/30/2013   History of cardiac dysrhythmia 05/23/2023   Isthmic spondylolisthesis 05/05/2020   Lumbar adjacent segment disease with spondylolisthesis 05/09/2023   Lung cancer (HCC) 01/15/2024   Need for shingles vaccine 09/22/2015   Neuromuscular disorder (HCC) 11/2019   sciatica   Nodule of left lung  Nonsustained ventricular tachycardia (HCC), identified on event monitor November 2024, 1 episode 10 beats duration, asymptomatic. 08/06/2023   Pain of left hip joint 11/01/2018   Paroxysmal SVT (supraventricular tachycardia) (HCC), incidental finding IntraOp back surgery  requiring esmolol ; event monitor November 2024 for short runs, longest episode 17 beats. 06/19/2023   Event monitor November 2024, supraventricular ectopy burden 1.7%.  4 short runs of SVT, longest episode 17 beats.  No symptoms reported     Personal history of congenital hip dysplasia 05/30/2013   Preventative health care 01/29/2015   Rhinitis, allergic 09/22/2015   Rosacea 05/30/2013   Scoliosis deformity of spine 05/05/2020   Spinal stenosis of lumbar region 06/16/2023   Spondylolisthesis at L5-S1 level 09/14/2020   Tobacco abuse 05/30/2013    ALLERGIES:  has no known allergies.  MEDICATIONS:  Current Outpatient Medications  Medication Sig Dispense Refill   acetaminophen  (TYLENOL ) 500 MG tablet Take 500 mg by mouth every 6 (six) hours as needed for moderate pain (pain score 4-6).     albuterol  (VENTOLIN  HFA) 108 (90 Base) MCG/ACT inhaler Inhale 2 puffs into the lungs every 6 (six) hours as needed for wheezing or shortness of breath. 6.7 g 2   apixaban  (ELIQUIS ) 5 MG TABS tablet Take 1 tablet (5 mg total) by mouth 2 (two) times daily. 180 tablet 3   aspirin  EC 81 MG tablet Take 1 tablet (81 mg total) by mouth daily. Swallow whole.     calcium  carbonate (TUMS EX) 750 MG chewable tablet Chew 1-2 tablets by mouth 2 (two) times daily as needed for heartburn.     Clotrimazole  1 % OINT Apply to affected area twice daily as needed 56.7 g 0   Emollient (AQUAPHOR OINTMENT BODY EX) Apply topically.     empagliflozin  (JARDIANCE ) 10 MG TABS tablet Take 1 tablet (10 mg total) by mouth daily before breakfast. 90 tablet 1   fluticasone -salmeterol (WIXELA INHUB) 100-50 MCG/ACT AEPB Inhale 1 puff into the lungs 2 (two) times daily. 60 each 11   furosemide  (LASIX ) 20 MG tablet Take 1 tablet (20 mg total) by mouth as directed. Take 20 mg daily and an additional 20 mg every other day (Patient taking differently: Take 20-40 mg by mouth in the morning. Take one tablet (20mg ) by mouth every other day.  On  opposite days, take two tablets (40mg ) by mouth.) 135 tablet 3   ibuprofen  (ADVIL ) 200 MG tablet Take 200 mg by mouth every 6 (six) hours as needed for moderate pain (pain score 4-6).     metoprolol  succinate (TOPROL  XL) 25 MG 24 hr tablet Take 1 tablet (25 mg total) by mouth daily. 90 tablet 3   Multiple Vitamins-Minerals (MULTIVITAMIN GUMMIES ADULT) CHEW Chew 8 each by mouth in the morning. Chew eight gummies by mouth daily in the morning.     Oxycodone  HCl 10 MG TABS Take 1 tablet by mouth every 6 (six) hours as needed.     OXYGEN  Inhale 4 L/min into the lungs continuous.     Spacer/Aero-Holding Chambers DEVI 1 each by Does not apply route 4 (four) times daily as needed. 1 each 0   STELARA 45 MG/0.5ML injection Inject 45 mg into the skin as directed. Every 12 weeks     Timolol  Maleate, Once-Daily, 0.5 % SOLN Place 1 drop into both eyes in the morning and at bedtime.     Travoprost, BAK Free, (TRAVATAN) 0.004 % SOLN ophthalmic solution Place 1 drop into both eyes in the morning.  No current facility-administered medications for this visit.    SURGICAL HISTORY:  Past Surgical History:  Procedure Laterality Date   ANTERIOR LAT LUMBAR FUSION Right 05/09/2023   Procedure: Extreme Lateral Interbody Fusion Lumbar three-four -right;  Surgeon: Louis Shove, MD;  Location: [facility] OR;  Service: Neurosurgery;  Laterality: Right;  3C   BRONCHOSCOPY, WITH BIOPSY USING ELECTROMAGNETIC NAVIGATION Bilateral 01/09/2024   Procedure: ROBOTIC ASSISTED NAVIGATIONAL BRONCHOSCOPY;  Surgeon: Isadora Hose, MD;  Location: [facility] ORS;  Service: Pulmonary;  Laterality: Bilateral;   COLONOSCOPY  06/17/2015   Pyrtle   COLONOSCOPY  07/20/2020   Pyrtle   COLONOSCOPY  02/15/2024   ENDOBRONCHIAL ULTRASOUND Bilateral 01/09/2024   Procedure: ENDOBRONCHIAL ULTRASOUND (EBUS);  Surgeon: Isadora Hose, MD;  Location: [facility] ORS;  Service: Pulmonary;  Laterality: Bilateral;   HAMMER TOE SURGERY  07/28/2011   Procedure: HAMMER  TOE CORRECTION;  Surgeon: Toribio JULIANNA Chancy, MD;  Location: [facility];  Service: Orthopedics;  Laterality: Right;  right 2nd and 4th toes correction hammer toe, capsulotomy metatarsal-phalangeal joints   HERNIA REPAIR Bilateral    HIP SURGERY  1986 & 2010   rt total hip-8/10-multiple rt hip surgeries- had 4 prior to 1986 as a child   INTERCOSTAL NERVE BLOCK Left 02/19/2024   Procedure: BLOCK, NERVE, INTERCOSTAL;  Surgeon: Kerrin Elspeth BROCKS, MD;  Location: [facility] OR;  Service: Thoracic;  Laterality: Left;   JOINT REPLACEMENT  2010   Partial Right Hip   LOBECTOMY, LUNG, ROBOT-ASSISTED, USING VATS Left 02/19/2024   Procedure: LOBECTOMY, LUNG, ROBOT-ASSISTED, USING VATS;  Surgeon: Kerrin Elspeth BROCKS, MD;  Location: [facility] OR;  Service: Thoracic;  Laterality: Left;  ROBOTIC LEFT LOWER LOBECTOMY   lumber fusion  09/2020   POLYPECTOMY     RIGHT/LEFT HEART CATH AND CORONARY ANGIOGRAPHY N/A 05/10/2024   Procedure: RIGHT/LEFT HEART CATH AND CORONARY ANGIOGRAPHY;  Surgeon: Wonda Sharper, MD;  Location: [facility] INVASIVE CV LAB;  Service: Cardiovascular;  Laterality: N/A;   SENTINEL NODE BIOPSY Left 02/19/2024   Procedure: BIOPSY, LYMPH NODE;  Surgeon: Kerrin Elspeth BROCKS, MD;  Location: [facility] OR;  Service: Thoracic;  Laterality: Left;   SPINE SURGERY  09/14/2020   THUMB ARTHROSCOPY  2008   rt   TOTAL HIP ARTHROPLASTY Right 1985   VASECTOMY  1990   VIDEO BRONCHOSCOPY N/A 02/23/2024   Procedure: VIDEO BRONCHOSCOPY WITHOUT FLUORO;  Surgeon: Kerrin Elspeth BROCKS, MD;  Location[facility] ENDOSCOPY;  Service: Thoracic;  Laterality: N/A;  Bronch with IBV insertion   VIDEO BRONCHOSCOPY WITH INSERTION OF INTERBRONCHIAL VALVE (IBV) N/A 02/23/2024   Procedure: BRONCHOSCOPY, FLEXIBLE, WITH INTRABRONCHIAL VALVE INSERTION;  Surgeon: Kerrin Elspeth BROCKS, MD;  Location: [facility] ENDOSCOPY;  Service: Thoracic;  Laterality: N/A;    REVIEW OF SYSTEMS:  A comprehensive review of systems was negative except for:  Constitutional: positive for fatigue Respiratory: positive for dyspnea on exertion and wheezing Musculoskeletal: positive for arthralgias   PHYSICAL EXAMINATION: General appearance: alert, cooperative, and no distress Head: Normocephalic, without obvious abnormality, atraumatic Neck: no adenopathy, no JVD, supple, symmetrical, trachea midline, and thyroid  not enlarged, symmetric, no tenderness/mass/nodules Lymph nodes: Cervical, supraclavicular, and axillary nodes normal. Resp: wheezes bilaterally Back: symmetric, no curvature. ROM normal. No CVA tenderness. Cardio: regular rate and rhythm, S1, S2 normal, no murmur, click, rub or gallop GI: soft, non-tender; bowel sounds normal; no masses,  no organomegaly Extremities: extremities normal, atraumatic, no cyanosis or edema  ECOG PERFORMANCE STATUS: 1 - Symptomatic but completely ambulatory  Blood pressure 123/60, pulse 77, temperature 97.7 F (36.5  C), temperature source Temporal, resp. rate 17, height 6' 1 (1.854 m), weight 197 lb 3.2 oz (89.4 kg), SpO2 97%.  LABORATORY DATA: Lab Results  Component Value Date   WBC 7.5 09/04/2024   HGB 14.7 09/04/2024   HCT 43.2 09/04/2024   MCV 96.4 09/04/2024   PLT 199 09/04/2024      Chemistry      Component Value Date/Time   NA 140 09/04/2024 0822   NA 138 04/12/2024 1318   K 5.5 (H) 09/04/2024 0822   CL 103 09/04/2024 0822   CO2 28 09/04/2024 0822   BUN 15 09/04/2024 0822   BUN 18 04/12/2024 1318   CREATININE 1.10 09/04/2024 0822      Component Value Date/Time   CALCIUM  9.6 09/04/2024 0822   ALKPHOS 63 09/04/2024 0822   AST 26 09/04/2024 0822   ALT 12 09/04/2024 0822   BILITOT 0.4 09/04/2024 9177       RADIOGRAPHIC STUDIES: MR NECK SOFT TISSUE ONLY W WO CONTRAST Result Date: 09/09/2024 CLINICAL DATA:  Follow-up left parotid mass EXAM: MRI OF THE NECK WITH CONTRAST TECHNIQUE: Multiplanar, multisequence MR imaging was performed following the administration of intravenous  contrast. CONTRAST:  7mL GADAVIST  GADOBUTROL  1 MMOL/ML IV SOLN COMPARISON:  January 08, 2024 FINDINGS: Pharynx: The nasopharynx, oropharynx and hypopharynx are normal Oral cavity/floor of mouth: Normal Larynx: Normal Salivary glands: The right parotid gland and the submandibular glands are normal. There is a 8 x 8 by 11 mm well-circumscribed lesion in the left parotid gland at the junction of the superficial and deep lobes, just posterior to the left facial vein. The lesion has T1 hypointensity, T2 hyperintensity and uniform enhancement. Thyroid : Normal Lymph nodes: No adenopathy Vascular: No significant abnormality Limited intracranial: No significant abnormality Visualized orbits: No significant abnormality Mastoids and visualized paranasal sinuses: No significant abnormality Skeleton: Cervical spondylosis Upper chest: No significant abnormality Other: None IMPRESSION: 8 x 8 x 11 mm well-circumscribed lesion in the left parotid gland just posterior to the facial vein. There is no change from the prior study. 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 Electronically Signed   By: Nancyann Burns M.D.   On: 09/09/2024 10:08   CT Chest W Contrast Result Date: 09/06/2024 CLINICAL DATA:  Non-small-cell lung cancer. EXAM: CT CHEST WITH CONTRAST TECHNIQUE: Multidetector CT imaging of the chest was performed during intravenous contrast administration. RADIATION DOSE REDUCTION: This exam was performed according to the departmental dose-optimization program which includes automated exposure control, adjustment of the mA and/or kV according to patient size and/or use of iterative reconstruction technique. CONTRAST:  75mL OMNIPAQUE  IOHEXOL  300 MG/ML  SOLN COMPARISON:  07/09/2024 FINDINGS: Cardiovascular: The heart size is normal. No substantial pericardial effusion. Coronary artery calcification is evident. Mild atherosclerotic calcification is noted in the wall of the thoracic aorta. Mediastinum/Nodes: No mediastinal  lymphadenopathy. The esophagus has normal imaging features. There is no axillary lymphadenopathy. Lungs/Pleura: Centrilobular and paraseptal emphysema evident. Calcified pleural plaque again noted right apex with subtle calcification noted in the left apical hemithorax. Volume loss left hemithorax consistent with left lower lobectomy. 8 mm sub solid nodule left upper lobe on 42/6 is new in the interval. 3 mm right lower lobe nodule on 92/6 was in an area of consolidative disease on the previous study. The prominent interstitial and consolidative airspace disease seen in both lung bases previously has essentially resolved in the interval. There is some peripheral architectural distortion probable scarring in the left lung base. Tiny loculated left pleural effusion identified in  the paraspinal and medial basilar left hemithorax on image 80/2 and 106/2. There may be some trace anterior loculated fluid in the left hemithorax as well. Upper Abdomen: Visualized portion of the upper abdomen shows no acute findings. Musculoskeletal: No worrisome lytic or sclerotic osseous abnormality. IMPRESSION: 1. Sequelae of left lower lobectomy. 2. Interval resolution of the prominent interstitial and consolidative airspace disease seen in both lung bases previously. 3. Tiny loculated left pleural effusion in the paraspinal and medial basilar left hemithorax. There may be some trace anterior loculated fluid in the left hemithorax as well. 4. 8 mm sub solid nodule left upper lobe is new in the interval. 3 mm right lower lobe nodule was in an area of consolidative disease on the previous study. Both of these require close attention on follow-up imaging. 5. Emphysema (ICD10-J43.9) and Aortic Atherosclerosis (ICD10-170.0) Electronically Signed   By: Camellia Candle M.D.   On: 09/06/2024 13:41    ASSESSMENT AND PLAN: This is a very pleasant 69 years old white male with Stage IA (T1b, N0, M0) non-small cell lung cancer, adenocarcinoma  presented with left lower lobe lung nodule was suspicious mediastinal lymph nodes diagnosed in June 2025.  He is status post left lower lobectomy with lymph node dissection on February 19, 2024 with the final tumor size of 1.9 cm non-small cell lung cancer, adenocarcinoma with additional 1.9 cm adenocarcinoma in situ focus. The patient is currently on observation. He had repeat CT scan of the chest performed recently.  I personally independently reviewed the scan and discussed the results with the patient and his daughter today.  His scan showed no concerning findings for disease progression except for new 8 mm left lung nodule likely inflammatory in origin but malignancy could not be completely occluded. Assessment and Plan Assessment & Plan Stage IA non-small cell lung cancer, status post left lower lobectomy He remains in remission following left lower lobectomy and lymph node dissection for stage IA non-small cell lung cancer. Recent CT revealed a new 8 mm area in the left lung, likely inflammatory and not suspicious for malignancy. No evidence of recurrence. Prognosis remains excellent due to early stage and complete resection. Surveillance is ongoing, and the area will be monitored closely. - Reviewed recent CT and discussed that the 8 mm area is likely inflammatory and not suspicious for recurrence. - Recommended continued surveillance with repeat chest CT in six months. - Instructed him to report new or worsening symptoms, including dyspnea, chest pain, or hemoptysis.  Postoperative dyspnea and wheezing after lung surgery He experiences intermittent wheezing and dyspnea, particularly with exertion and when supine. Wheezing was present on examination. Symptoms are likely multifactorial, related to postoperative changes, underlying emphysema, and possible cardiac dysfunction. He uses inhalers and nebulizer with limited benefit. - Advised continuation of current inhalers and nebulizer as prescribed. -  Instructed him to report worsening symptoms, including increased dyspnea, chest pain, or hemoptysis. The patient was advised to call immediately if he has any other concerning symptoms in the interval.  The patient voices understanding of current disease status and treatment options and is in agreement with the current care plan.  All questions were answered. The patient knows to call the clinic with any problems, questions or concerns. We can certainly see the patient much sooner if necessary.  The total time spent in the appointment was 20 minutes including review of chart and various tests results, discussions about plan of care and coordination of care plan .   Disclaimer: This note  was dictated with voice recognition software. Similar sounding words can inadvertently be transcribed and may not be corrected upon review.        "

## 2024-09-12 ENCOUNTER — Other Ambulatory Visit: Payer: Self-pay

## 2024-09-12 DIAGNOSIS — I509 Heart failure, unspecified: Secondary | ICD-10-CM

## 2024-09-12 MED ORDER — SACUBITRIL-VALSARTAN 24-26 MG PO TABS: 1.0000 | ORAL_TABLET | Freq: Two times a day (BID) | ORAL | 3 refills | Status: AC

## 2024-09-17 ENCOUNTER — Ambulatory Visit: Admitting: Family

## 2024-09-24 ENCOUNTER — Ambulatory Visit

## 2024-09-24 ENCOUNTER — Ambulatory Visit: Admitting: Family

## 2024-09-26 ENCOUNTER — Ambulatory Visit (INDEPENDENT_AMBULATORY_CARE_PROVIDER_SITE_OTHER)

## 2024-10-11 ENCOUNTER — Ambulatory Visit: Admitting: Family

## 2024-10-15 ENCOUNTER — Ambulatory Visit: Admitting: Genetic Counselor

## 2025-03-03 ENCOUNTER — Inpatient Hospital Stay

## 2025-03-11 ENCOUNTER — Inpatient Hospital Stay: Admitting: Internal Medicine
